# Patient Record
Sex: Female | Born: 1965 | Race: Black or African American | Hispanic: No | Marital: Married | Smoking: Former smoker
Health system: Southern US, Community
[De-identification: ages and names within clinical notes are randomized; demographics above are authoritative.]

## PROBLEM LIST (undated history)

## (undated) ENCOUNTER — Ambulatory Visit: Payer: 59

## (undated) DIAGNOSIS — I219 Acute myocardial infarction, unspecified: Secondary | ICD-10-CM

## (undated) DIAGNOSIS — I10 Essential (primary) hypertension: Secondary | ICD-10-CM

## (undated) DIAGNOSIS — E119 Type 2 diabetes mellitus without complications: Secondary | ICD-10-CM

## (undated) DIAGNOSIS — M1990 Unspecified osteoarthritis, unspecified site: Secondary | ICD-10-CM

## (undated) HISTORY — DX: Acute myocardial infarction, unspecified: I21.9

## (undated) HISTORY — PX: CORONARY ANGIOPLASTY WITH STENT PLACEMENT: SHX49

---

## 1998-10-28 ENCOUNTER — Encounter: Payer: Self-pay | Admitting: General Surgery

## 1998-10-28 ENCOUNTER — Ambulatory Visit (HOSPITAL_COMMUNITY): Admission: RE | Admit: 1998-10-28 | Discharge: 1998-10-28 | Payer: Self-pay

## 2000-09-26 ENCOUNTER — Other Ambulatory Visit: Admission: RE | Admit: 2000-09-26 | Discharge: 2000-09-26 | Payer: Self-pay | Admitting: Obstetrics and Gynecology

## 2001-12-10 ENCOUNTER — Other Ambulatory Visit: Admission: RE | Admit: 2001-12-10 | Discharge: 2001-12-10 | Payer: Self-pay | Admitting: Obstetrics and Gynecology

## 2001-12-19 ENCOUNTER — Encounter: Admission: RE | Admit: 2001-12-19 | Discharge: 2002-03-19 | Payer: Self-pay | Admitting: Family Medicine

## 2002-11-19 ENCOUNTER — Other Ambulatory Visit: Admission: RE | Admit: 2002-11-19 | Discharge: 2002-11-19 | Payer: Self-pay | Admitting: Obstetrics and Gynecology

## 2003-12-18 ENCOUNTER — Ambulatory Visit: Payer: Self-pay | Admitting: Family Medicine

## 2003-12-25 ENCOUNTER — Ambulatory Visit (HOSPITAL_COMMUNITY): Admission: RE | Admit: 2003-12-25 | Discharge: 2003-12-25 | Payer: Self-pay | Admitting: Obstetrics and Gynecology

## 2004-01-01 ENCOUNTER — Ambulatory Visit: Payer: Self-pay | Admitting: Family Medicine

## 2004-01-08 ENCOUNTER — Ambulatory Visit: Payer: Self-pay | Admitting: Family Medicine

## 2004-01-21 ENCOUNTER — Ambulatory Visit: Payer: Self-pay | Admitting: *Deleted

## 2004-01-29 ENCOUNTER — Ambulatory Visit (HOSPITAL_COMMUNITY): Admission: RE | Admit: 2004-01-29 | Discharge: 2004-01-29 | Payer: Self-pay | Admitting: *Deleted

## 2004-01-29 ENCOUNTER — Ambulatory Visit: Payer: Self-pay | Admitting: Family Medicine

## 2004-02-12 ENCOUNTER — Ambulatory Visit: Payer: Self-pay | Admitting: Family Medicine

## 2004-02-19 ENCOUNTER — Ambulatory Visit: Payer: Self-pay | Admitting: Family Medicine

## 2004-02-24 ENCOUNTER — Ambulatory Visit (HOSPITAL_COMMUNITY): Admission: RE | Admit: 2004-02-24 | Discharge: 2004-02-24 | Payer: Self-pay | Admitting: *Deleted

## 2004-02-24 ENCOUNTER — Ambulatory Visit: Payer: Self-pay | Admitting: *Deleted

## 2004-02-26 ENCOUNTER — Inpatient Hospital Stay (HOSPITAL_COMMUNITY): Admission: AD | Admit: 2004-02-26 | Discharge: 2004-02-28 | Payer: Self-pay | Admitting: Obstetrics and Gynecology

## 2004-02-26 ENCOUNTER — Ambulatory Visit: Payer: Self-pay | Admitting: *Deleted

## 2004-04-01 ENCOUNTER — Ambulatory Visit: Payer: Self-pay | Admitting: Family Medicine

## 2006-09-04 ENCOUNTER — Ambulatory Visit: Payer: Self-pay | Admitting: Family Medicine

## 2006-09-05 ENCOUNTER — Ambulatory Visit: Payer: Self-pay | Admitting: *Deleted

## 2006-09-11 ENCOUNTER — Ambulatory Visit: Payer: Self-pay | Admitting: Internal Medicine

## 2007-01-03 ENCOUNTER — Ambulatory Visit: Payer: Self-pay | Admitting: Internal Medicine

## 2007-01-05 ENCOUNTER — Ambulatory Visit: Payer: Self-pay | Admitting: Nurse Practitioner

## 2007-02-05 ENCOUNTER — Encounter (INDEPENDENT_AMBULATORY_CARE_PROVIDER_SITE_OTHER): Payer: Self-pay | Admitting: Nurse Practitioner

## 2007-02-05 ENCOUNTER — Encounter (INDEPENDENT_AMBULATORY_CARE_PROVIDER_SITE_OTHER): Payer: Self-pay | Admitting: Family Medicine

## 2007-02-05 ENCOUNTER — Ambulatory Visit: Payer: Self-pay | Admitting: Family Medicine

## 2007-11-01 ENCOUNTER — Encounter (INDEPENDENT_AMBULATORY_CARE_PROVIDER_SITE_OTHER): Payer: Self-pay | Admitting: Family Medicine

## 2007-11-01 ENCOUNTER — Ambulatory Visit: Payer: Self-pay | Admitting: Internal Medicine

## 2007-11-01 LAB — CONVERTED CEMR LAB
ALT: 14 units/L (ref 0–35)
AST: 13 units/L (ref 0–37)
Albumin: 4.7 g/dL (ref 3.5–5.2)
BUN: 10 mg/dL (ref 6–23)
CO2: 23 meq/L (ref 19–32)
Calcium: 9.7 mg/dL (ref 8.4–10.5)
Chloride: 99 meq/L (ref 96–112)
Cholesterol: 202 mg/dL — ABNORMAL HIGH (ref 0–200)
HDL: 45 mg/dL (ref >39)
Microalb, Ur: 3.68 mg/dL — ABNORMAL HIGH (ref 0.00–1.89)
Potassium: 4.3 meq/L (ref 3.5–5.3)

## 2008-08-21 ENCOUNTER — Ambulatory Visit: Payer: Self-pay | Admitting: Internal Medicine

## 2008-08-28 ENCOUNTER — Ambulatory Visit (HOSPITAL_COMMUNITY): Admission: RE | Admit: 2008-08-28 | Discharge: 2008-08-28 | Payer: Self-pay | Admitting: Family Medicine

## 2008-09-02 ENCOUNTER — Encounter: Admission: RE | Admit: 2008-09-02 | Discharge: 2008-09-02 | Payer: Self-pay | Admitting: Family Medicine

## 2008-11-24 ENCOUNTER — Ambulatory Visit: Payer: Self-pay | Admitting: Internal Medicine

## 2008-12-15 ENCOUNTER — Ambulatory Visit: Payer: Self-pay | Admitting: Internal Medicine

## 2008-12-15 ENCOUNTER — Encounter (INDEPENDENT_AMBULATORY_CARE_PROVIDER_SITE_OTHER): Payer: Self-pay | Admitting: Internal Medicine

## 2008-12-15 LAB — CONVERTED CEMR LAB
ALT: 21 units/L (ref 0–35)
Albumin: 4.4 g/dL (ref 3.5–5.2)
CO2: 21 meq/L (ref 19–32)
Calcium: 9.3 mg/dL (ref 8.4–10.5)
Chlamydia, DNA Probe: NEGATIVE
Chloride: 101 meq/L (ref 96–112)
Hemoglobin: 13.4 g/dL (ref 12.0–15.0)
Lymphs Abs: 2.5 10*3/uL (ref 0.7–4.0)
Monocytes Relative: 9 % (ref 3–12)
Neutro Abs: 3.3 10*3/uL (ref 1.7–7.7)
Neutrophils Relative %: 51 % (ref 43–77)
Potassium: 4.3 meq/L (ref 3.5–5.3)
RBC: 4.85 M/uL (ref 3.87–5.11)
Sodium: 136 meq/L (ref 135–145)
TSH: 0.502 microintl units/mL (ref 0.350–4.500)
Total Bilirubin: 0.6 mg/dL (ref 0.3–1.2)
Total Protein: 7.1 g/dL (ref 6.0–8.3)
WBC: 6.5 10*3/uL (ref 4.0–10.5)

## 2008-12-16 ENCOUNTER — Encounter (INDEPENDENT_AMBULATORY_CARE_PROVIDER_SITE_OTHER): Payer: Self-pay | Admitting: Internal Medicine

## 2008-12-23 ENCOUNTER — Ambulatory Visit (HOSPITAL_COMMUNITY): Admission: RE | Admit: 2008-12-23 | Discharge: 2008-12-23 | Payer: Self-pay | Admitting: Internal Medicine

## 2009-02-17 ENCOUNTER — Ambulatory Visit (HOSPITAL_COMMUNITY): Admission: RE | Admit: 2009-02-17 | Discharge: 2009-02-17 | Payer: Self-pay | Admitting: Internal Medicine

## 2009-03-24 ENCOUNTER — Ambulatory Visit: Payer: Self-pay | Admitting: Internal Medicine

## 2009-05-01 ENCOUNTER — Ambulatory Visit: Payer: Self-pay | Admitting: Family Medicine

## 2009-05-01 LAB — CONVERTED CEMR LAB
AST: 13 units/L (ref 0–37)
BUN: 8 mg/dL (ref 6–23)
Calcium: 9 mg/dL (ref 8.4–10.5)
Chloride: 98 meq/L (ref 96–112)
Creatinine, Ser: 0.55 mg/dL (ref 0.40–1.20)
HDL: 41 mg/dL (ref >39)
Hgb A1c MFr Bld: 10.3 % — ABNORMAL HIGH (ref 4.6–6.1)
Total CHOL/HDL Ratio: 3.8

## 2009-05-06 ENCOUNTER — Encounter (INDEPENDENT_AMBULATORY_CARE_PROVIDER_SITE_OTHER): Payer: Self-pay | Admitting: Family Medicine

## 2009-07-22 ENCOUNTER — Ambulatory Visit: Payer: Self-pay | Admitting: Family Medicine

## 2009-10-12 ENCOUNTER — Ambulatory Visit: Payer: Self-pay | Admitting: Internal Medicine

## 2009-10-12 ENCOUNTER — Encounter (INDEPENDENT_AMBULATORY_CARE_PROVIDER_SITE_OTHER): Payer: Self-pay | Admitting: Family Medicine

## 2009-10-19 ENCOUNTER — Ambulatory Visit (HOSPITAL_COMMUNITY): Admission: RE | Admit: 2009-10-19 | Discharge: 2009-10-19 | Payer: Self-pay | Admitting: Family Medicine

## 2009-11-04 ENCOUNTER — Ambulatory Visit: Payer: Self-pay | Admitting: Internal Medicine

## 2010-03-13 ENCOUNTER — Encounter: Payer: Self-pay | Admitting: *Deleted

## 2012-02-08 ENCOUNTER — Inpatient Hospital Stay (HOSPITAL_COMMUNITY): Payer: Medicaid Other

## 2012-02-08 ENCOUNTER — Emergency Department (HOSPITAL_COMMUNITY): Payer: Medicaid Other

## 2012-02-08 ENCOUNTER — Encounter (HOSPITAL_COMMUNITY): Payer: Self-pay | Admitting: Emergency Medicine

## 2012-02-08 ENCOUNTER — Inpatient Hospital Stay (HOSPITAL_COMMUNITY)
Admission: EM | Admit: 2012-02-08 | Discharge: 2012-02-14 | DRG: 853 | Disposition: A | Discharge status: Home / Self Care | Payer: Medicaid Other | Attending: Internal Medicine | Admitting: Internal Medicine

## 2012-02-08 DIAGNOSIS — E781 Pure hyperglyceridemia: Secondary | ICD-10-CM

## 2012-02-08 DIAGNOSIS — IMO0002 Reserved for concepts with insufficient information to code with codable children: Secondary | ICD-10-CM

## 2012-02-08 DIAGNOSIS — R6889 Other general symptoms and signs: Secondary | ICD-10-CM

## 2012-02-08 DIAGNOSIS — A419 Sepsis, unspecified organism: Secondary | ICD-10-CM

## 2012-02-08 DIAGNOSIS — E1110 Type 2 diabetes mellitus with ketoacidosis without coma: Secondary | ICD-10-CM

## 2012-02-08 DIAGNOSIS — R7881 Bacteremia: Secondary | ICD-10-CM

## 2012-02-08 DIAGNOSIS — R7989 Other specified abnormal findings of blood chemistry: Secondary | ICD-10-CM

## 2012-02-08 DIAGNOSIS — Z955 Presence of coronary angioplasty implant and graft: Secondary | ICD-10-CM

## 2012-02-08 DIAGNOSIS — I214 Non-ST elevation (NSTEMI) myocardial infarction: Secondary | ICD-10-CM

## 2012-02-08 DIAGNOSIS — E1165 Type 2 diabetes mellitus with hyperglycemia: Secondary | ICD-10-CM

## 2012-02-08 DIAGNOSIS — A4151 Sepsis due to Escherichia coli [E. coli]: Principal | ICD-10-CM | POA: Diagnosis present

## 2012-02-08 DIAGNOSIS — E875 Hyperkalemia: Secondary | ICD-10-CM | POA: Diagnosis present

## 2012-02-08 DIAGNOSIS — F17200 Nicotine dependence, unspecified, uncomplicated: Secondary | ICD-10-CM

## 2012-02-08 DIAGNOSIS — N390 Urinary tract infection, site not specified: Secondary | ICD-10-CM

## 2012-02-08 DIAGNOSIS — R748 Abnormal levels of other serum enzymes: Secondary | ICD-10-CM

## 2012-02-08 DIAGNOSIS — E871 Hypo-osmolality and hyponatremia: Secondary | ICD-10-CM | POA: Diagnosis present

## 2012-02-08 DIAGNOSIS — E1310 Other specified diabetes mellitus with ketoacidosis without coma: Secondary | ICD-10-CM | POA: Diagnosis present

## 2012-02-08 HISTORY — DX: Type 2 diabetes mellitus without complications: E11.9

## 2012-02-08 HISTORY — DX: Non-ST elevation (NSTEMI) myocardial infarction: I21.4

## 2012-02-08 LAB — URINALYSIS, ROUTINE W REFLEX MICROSCOPIC
Nitrite: NEGATIVE
Specific Gravity, Urine: 1.029 (ref 1.005–1.030)
Urobilinogen, UA: 0.2 mg/dL (ref 0.0–1.0)
pH: 5 (ref 5.0–8.0)

## 2012-02-08 LAB — BASIC METABOLIC PANEL
BUN: 13 mg/dL (ref 6–23)
CO2: 16 mEq/L — ABNORMAL LOW (ref 19–32)
Calcium: 9.7 mg/dL (ref 8.4–10.5)
Chloride: 101 mEq/L (ref 96–112)
GFR calc non Af Amer: >90 mL/min (ref >90)
Potassium: 4.2 mEq/L (ref 3.5–5.1)

## 2012-02-08 LAB — GLUCOSE, CAPILLARY
Glucose-Capillary: 378 mg/dL — ABNORMAL HIGH (ref 70–99)
Glucose-Capillary: 236 mg/dL — ABNORMAL HIGH (ref 70–99)

## 2012-02-08 LAB — CBC WITH DIFFERENTIAL/PLATELET
Eosinophils Absolute: 0 10*3/uL (ref 0.0–0.7)
Eosinophils Relative: 0 % (ref 0–5)
Hemoglobin: 15 g/dL (ref 12.0–15.0)
Lymphs Abs: 1.7 10*3/uL (ref 0.7–4.0)
MCH: 27.7 pg (ref 26.0–34.0)
MCV: 81.7 fL (ref 78.0–100.0)
Monocytes Absolute: 2.4 10*3/uL — ABNORMAL HIGH (ref 0.1–1.0)
Monocytes Relative: 17 % — ABNORMAL HIGH (ref 3–12)
Platelets: 251 10*3/uL (ref 150–400)
RBC: 5.41 MIL/uL — ABNORMAL HIGH (ref 3.87–5.11)

## 2012-02-08 LAB — CBC
HCT: 37.8 % (ref 36.0–46.0)
MCH: 27.4 pg (ref 26.0–34.0)
MCHC: 34.9 g/dL (ref 30.0–36.0)
MCV: 78.6 fL (ref 78.0–100.0)
Platelets: 228 10*3/uL (ref 150–400)
RDW: 13.4 % (ref 11.5–15.5)
WBC: 12.7 10*3/uL — ABNORMAL HIGH (ref 4.0–10.5)

## 2012-02-08 LAB — POCT I-STAT 3, ART BLOOD GAS (G3+)
Acid-base deficit: 15 mmol/L — ABNORMAL HIGH (ref 0.0–2.0)
O2 Saturation: 98 %
Patient temperature: 98.6
TCO2: 9 mmol/L (ref 0–100)
pCO2 arterial: 17.1 mmHg — CL (ref 35.0–45.0)

## 2012-02-08 LAB — URINE MICROSCOPIC-ADD ON

## 2012-02-08 LAB — LIPASE, BLOOD: Lipase: 25 U/L (ref 11–59)

## 2012-02-08 LAB — COMPREHENSIVE METABOLIC PANEL
BUN: 15 mg/dL (ref 6–23)
Calcium: 10.5 mg/dL (ref 8.4–10.5)
Creatinine, Ser: 0.74 mg/dL (ref 0.50–1.10)
GFR calc Af Amer: >90 mL/min (ref >90)
Glucose, Bld: 534 mg/dL — ABNORMAL HIGH (ref 70–99)
Total Protein: 8.8 g/dL — ABNORMAL HIGH (ref 6.0–8.3)

## 2012-02-08 LAB — CK TOTAL AND CKMB (NOT AT ARMC): Relative Index: 4.7 — ABNORMAL HIGH (ref 0.0–2.5)

## 2012-02-08 MED ORDER — METOPROLOL TARTRATE 12.5 MG HALF TABLET
12.5000 mg | ORAL_TABLET | Freq: Two times a day (BID) | ORAL | Status: DC
  Filled 2012-02-08: qty 1

## 2012-02-08 MED ORDER — IPRATROPIUM BROMIDE 0.02 % IN SOLN
0.5000 mg | Freq: Once | RESPIRATORY_TRACT | Status: DC
  Filled 2012-02-08: qty 2.5

## 2012-02-08 MED ORDER — ACETAMINOPHEN 325 MG PO TABS
650.0000 mg | ORAL_TABLET | Freq: Four times a day (QID) | ORAL | Status: DC | PRN
  Administered 2012-02-09 – 2012-02-10 (×3): 650 mg via ORAL
  Filled 2012-02-08 (×3): qty 2

## 2012-02-08 MED ORDER — ONDANSETRON HCL 4 MG/2ML IJ SOLN: 4.0000 mg | Freq: Once | INTRAMUSCULAR | Status: DC

## 2012-02-08 MED ORDER — SODIUM CHLORIDE 0.9 % IV SOLN: Freq: Once | INTRAVENOUS | Status: DC

## 2012-02-08 MED ORDER — SODIUM CHLORIDE 0.9 % IV BOLUS (SEPSIS): 1000.0000 mL | Freq: Once | INTRAVENOUS | Status: DC

## 2012-02-08 MED ORDER — ONDANSETRON HCL 4 MG PO TABS: 4.0000 mg | ORAL_TABLET | Freq: Four times a day (QID) | ORAL | Status: DC | PRN

## 2012-02-08 MED ORDER — ACETAMINOPHEN 650 MG RE SUPP: 650.0000 mg | Freq: Four times a day (QID) | RECTAL | Status: DC | PRN

## 2012-02-08 MED ORDER — ONDANSETRON HCL 4 MG/2ML IJ SOLN: 4.0000 mg | Freq: Four times a day (QID) | INTRAMUSCULAR | Status: DC | PRN

## 2012-02-08 MED ORDER — SODIUM CHLORIDE 0.9 % IV SOLN: INTRAVENOUS | Status: DC

## 2012-02-08 MED ORDER — IPRATROPIUM BROMIDE 0.02 % IN SOLN
0.5000 mg | Freq: Once | RESPIRATORY_TRACT | Status: AC
  Administered 2012-02-08: 0.5 mg via RESPIRATORY_TRACT

## 2012-02-08 MED ORDER — INSULIN ASPART 100 UNIT/ML ~~LOC~~ SOLN: 7.0000 [IU] | Freq: Once | SUBCUTANEOUS | Status: DC

## 2012-02-08 MED ORDER — ATORVASTATIN CALCIUM 80 MG PO TABS
80.0000 mg | ORAL_TABLET | Freq: Every day | ORAL | Status: DC
  Administered 2012-02-08 – 2012-02-13 (×6): 80 mg via ORAL
  Filled 2012-02-08 (×8): qty 1

## 2012-02-08 MED ORDER — INSULIN REGULAR BOLUS VIA INFUSION
0.0000 [IU] | Freq: Three times a day (TID) | INTRAVENOUS | Status: DC
  Filled 2012-02-08: qty 10

## 2012-02-08 MED ORDER — ASPIRIN 81 MG PO CHEW
324.0000 mg | CHEWABLE_TABLET | Freq: Once | ORAL | Status: DC
  Administered 2012-02-08: 324 mg via ORAL
  Filled 2012-02-08: qty 4

## 2012-02-08 MED ORDER — SODIUM CHLORIDE 0.9 % IV SOLN
INTRAVENOUS | Status: DC
  Administered 2012-02-08: 125 mL/h via INTRAVENOUS

## 2012-02-08 MED ORDER — METOPROLOL TARTRATE 25 MG PO TABS
25.0000 mg | ORAL_TABLET | Freq: Two times a day (BID) | ORAL | Status: DC
  Administered 2012-02-08 – 2012-02-14 (×12): 25 mg via ORAL
  Filled 2012-02-08 (×15): qty 1

## 2012-02-08 MED ORDER — DEXTROSE-NACL 5-0.45 % IV SOLN
INTRAVENOUS | Status: DC
  Administered 2012-02-08: 21:00:00 via INTRAVENOUS

## 2012-02-08 MED ORDER — METOPROLOL TARTRATE 25 MG PO TABS
25.0000 mg | ORAL_TABLET | Freq: Two times a day (BID) | ORAL | Status: DC
  Filled 2012-02-08: qty 1

## 2012-02-08 MED ORDER — DEXTROSE 50 % IV SOLN: 25.0000 mL | INTRAVENOUS | Status: DC | PRN

## 2012-02-08 MED ORDER — ALBUTEROL SULFATE (5 MG/ML) 0.5% IN NEBU
2.5000 mg | INHALATION_SOLUTION | Freq: Once | RESPIRATORY_TRACT | Status: DC
  Filled 2012-02-08: qty 0.5

## 2012-02-08 MED ORDER — ALBUTEROL SULFATE (5 MG/ML) 0.5% IN NEBU
2.5000 mg | INHALATION_SOLUTION | Freq: Once | RESPIRATORY_TRACT | Status: AC
  Administered 2012-02-08: 2.5 mg via RESPIRATORY_TRACT

## 2012-02-08 MED ORDER — SODIUM CHLORIDE 0.9 % IV SOLN
INTRAVENOUS | Status: DC
  Filled 2012-02-08 (×2): qty 1

## 2012-02-08 MED ORDER — SODIUM CHLORIDE 0.9 % IV SOLN
INTRAVENOUS | Status: DC
  Administered 2012-02-08: 3.2 [IU]/h via INTRAVENOUS
  Filled 2012-02-08: qty 1

## 2012-02-08 MED ORDER — SODIUM CHLORIDE 0.9 % IJ SOLN
3.0000 mL | Freq: Two times a day (BID) | INTRAMUSCULAR | Status: DC
Start: 2012-02-08 — End: 2012-02-14
  Administered 2012-02-08 – 2012-02-13 (×9): 3 mL via INTRAVENOUS

## 2012-02-08 MED ORDER — ASPIRIN 81 MG PO CHEW: 325.0000 mg | CHEWABLE_TABLET | Freq: Every day | ORAL | Status: DC

## 2012-02-08 MED ORDER — ENOXAPARIN SODIUM 80 MG/0.8ML ~~LOC~~ SOLN
80.0000 mg | Freq: Two times a day (BID) | SUBCUTANEOUS | Status: DC
  Administered 2012-02-08 – 2012-02-10 (×4): 80 mg via SUBCUTANEOUS
  Filled 2012-02-08 (×8): qty 0.8

## 2012-02-08 NOTE — ED Provider Notes (Addendum)
History     CSN: [id number]  Arrival date & time 02/08/12  1224   First MD Initiated Contact with Patient 02/08/12 1501      Chief Complaint  Patient presents with  . Influenza    (Consider location/radiation/quality/duration/timing/severity/associated sxs/prior treatment) Patient is a 46 y.o. female presenting with flu symptoms. The history is provided by the patient.  Influenza  She has been sick for the last 2 weeks. She's had a cough productive of clear to yellowish sputum and has had subjective fever and chills without sweats. There's been some clear rhinorrhea. She is noted that dyspnea with even modest exertion. Symptoms have been stable over time. There's been nausea without vomiting. She denies any diarrhea. She denies chest pain or abdominal pain. She has noted urinary frequency and excessive thirst. She is diabetic and takes metformin, but has not checked her blood sugars.  Past Medical History  Diagnosis Date  . Diabetes mellitus without complication     No past surgical history on file.  No family history on file.  History  Substance Use Topics  . Smoking status: Never Smoker   . Smokeless tobacco: Not on file  . Alcohol Use: Yes    OB History    Grav Para Term Preterm Abortions TAB SAB Ect Mult Living                  Review of Systems  All other systems reviewed and are negative.    Allergies  Review of patient's allergies indicates no known allergies.  Home Medications  No current outpatient prescriptions on file.  BP 146/112  Pulse 142  Temp 98 F (36.7 C)  SpO2 100%  Physical Exam  Nursing note and vitals reviewed. 46 year old female, resting comfortably and in no acute distress. Vital signs are significant for tachycardia with heart rate of 142, and hypertension with blood pressure 146/112. Oxygen saturation is 100%, which is normal. Head is normocephalic and atraumatic. PERRLA, EOMI. Oropharynx is clear. Neck is nontender and supple  without adenopathy or JVD. Back is nontender and there is no CVA tenderness. Lungs are clear without rales, wheezes, or rhonchi. Prolonged exhalation phase is noted. Chest is nontender. Heart has regular rate and rhythm without murmur. Rate is tachycardic. Abdomen is soft, flat, nontender without masses or hepatosplenomegaly and peristalsis is normoactive. Extremities have no cyanosis or edema, full range of motion is present. Skin is warm and dry without rash. Neurologic: Mental status is normal, cranial nerves are intact, there are no motor or sensory deficits.   ED Course  Procedures (including critical care time)  Results for orders placed during the hospital encounter of 02/08/12  CBC WITH DIFFERENTIAL      Component Value Range   WBC 14.6 (*) 4.0 - 10.5 K/uL   RBC 5.41 (*) 3.87 - 5.11 MIL/uL   Hemoglobin 15.0  12.0 - 15.0 g/dL   HCT 78.2  95.6 - 21.3 %   MCV 81.7  78.0 - 100.0 fL   MCH 27.7  26.0 - 34.0 pg   MCHC 33.9  30.0 - 36.0 g/dL   RDW 08.6  57.8 - 46.9 %   Platelets 251  150 - 400 K/uL   Neutrophils Relative 72  43 - 77 %   Neutro Abs 10.5 (*) 1.7 - 7.7 K/uL   Lymphocytes Relative 12  12 - 46 %   Lymphs Abs 1.7  0.7 - 4.0 K/uL   Monocytes Relative 17 (*) 3 - 12 %  Monocytes Absolute 2.4 (*) 0.1 - 1.0 K/uL   Eosinophils Relative 0  0 - 5 %   Eosinophils Absolute 0.0  0.0 - 0.7 K/uL   Basophils Relative 0  0 - 1 %   Basophils Absolute 0.1  0.0 - 0.1 K/uL  COMPREHENSIVE METABOLIC PANEL      Component Value Range   Sodium 127 (*) 135 - 145 mEq/L   Potassium 5.3 (*) 3.5 - 5.1 mEq/L   Chloride 88 (*) 96 - 112 mEq/L   CO2 12 (*) 19 - 32 mEq/L   Glucose, Bld 534 (*) 70 - 99 mg/dL   BUN 15  6 - 23 mg/dL   Creatinine, Ser 1.61  0.50 - 1.10 mg/dL   Calcium 09.6  8.4 - 04.5 mg/dL   Total Protein 8.8 (*) 6.0 - 8.3 g/dL   Albumin 3.7  3.5 - 5.2 g/dL   AST 45 (*) 0 - 37 U/L   ALT 23  0 - 35 U/L   Alkaline Phosphatase 309 (*) 39 - 117 U/L   Total Bilirubin 0.4  0.3 -  1.2 mg/dL   GFR calc non Af Amer >90  >90 mL/min   GFR calc Af Amer >90  >90 mL/min  URINALYSIS, ROUTINE W REFLEX MICROSCOPIC      Component Value Range   Color, Urine YELLOW  YELLOW   APPearance CLOUDY (*) CLEAR   Specific Gravity, Urine 1.029  1.005 - 1.030   pH 5.0  5.0 - 8.0   Glucose, UA >1000 (*) NEGATIVE mg/dL   Hgb urine dipstick LARGE (*) NEGATIVE   Bilirubin Urine MODERATE (*) NEGATIVE   Ketones, ur >80 (*) NEGATIVE mg/dL   Protein, ur 409 (*) NEGATIVE mg/dL   Urobilinogen, UA 0.2  0.0 - 1.0 mg/dL   Nitrite NEGATIVE  NEGATIVE   Leukocytes, UA TRACE (*) NEGATIVE  URINE MICROSCOPIC-ADD ON      Component Value Range   Squamous Epithelial / LPF RARE  RARE   WBC, UA 11-20  <3 WBC/hpf   RBC / HPF 21-50  <3 RBC/hpf   Bacteria, UA FEW (*) RARE   Urine-Other LESS THAN 10 mL OF URINE SUBMITTED    LIPASE, BLOOD      Component Value Range   Lipase 25  11 - 59 U/L  TROPONIN I      Component Value Range   Troponin I 7.56 (*) <0.30 ng/mL  POCT I-STAT 3, BLOOD GAS (G3+)      Component Value Range   pH, Arterial 7.299 (*) 7.350 - 7.450   pCO2 arterial 17.1 (*) 35.0 - 45.0 mmHg   pO2, Arterial 106.0 (*) 80.0 - 100.0 mmHg   Bicarbonate 8.4 (*) 20.0 - 24.0 mEq/L   TCO2 9  0 - 100 mmol/L   O2 Saturation 98.0     Acid-base deficit 15.0 (*) 0.0 - 2.0 mmol/L   Patient temperature 98.6 F     Collection site RADIAL, ALLEN'S TEST ACCEPTABLE     Drawn by Operator     Sample type ARTERIAL     Comment NOTIFIED PHYSICIAN     Dg Chest Portable 1 View  02/08/2012  *RADIOLOGY REPORT*  Clinical Data: Flu like symptoms.  Chest pain and shortness of breath.  PORTABLE CHEST - 1 VIEW  Comparison: None.  Findings: The heart size is normal.  The lung volumes are low. This exaggerates the pulmonary interstitium.  No focal airspace disease is evident.  The visualized soft tissues and bony  thorax are unremarkable.  IMPRESSION: No acute cardiopulmonary disease.   Original Report Authenticated By:  Marin Roberts, M.D.       Date: 02/08/2012  Rate: 119  Rhythm: sinus tachycardia  QRS Axis: normal  Intervals: normal  ST/T Wave abnormalities: early repolarization  Conduction Disutrbances:none  Narrative Interpretation: Sinus tachycardia, right atrial hypertrophy, early repolarization. No prior ECG available for comparison.  Old EKG Reviewed: none available    1. Diabetic ketoacidosis   2. Elevated alkaline phosphatase level    CRITICAL CARE Performed by: OZHYQ,[email or web address]   Total critical care time: 85 minutes  Critical care time was exclusive of separately billable procedures and treating other patients.  Critical care was necessary to treat or prevent imminent or life-threatening deterioration.  Critical care was time spent personally by me on the following activities: development of treatment plan with patient and/or surrogate as well as nursing, discussions with consultants, evaluation of patient's response to treatment, examination of patient, obtaining history from patient or surrogate, ordering and performing treatments and interventions, ordering and review of laboratory studies, ordering and review of radiographic studies, pulse oximetry and re-evaluation of patient's condition.   MDM  Symptoms suggestive of either a viral respiratory tract infection or pneumonia. Laboratory workup has come back showing evidence of ketoacidosis with blood sugar 534, low CO2 of 12 and elevated anion gap of 27. Chest x-ray will be obtained to rule out pneumonia. She will begin a therapeutic trial of albuterol with Atrovent. She is started on IV fluids and IV insulin drip. ECG will be obtained as well as troponin and lipase. She will need to be admitted for control of her ketoacidosis. Old records are reviewed, and she does not have any prior admissions for ketoacidosis.  Troponin has come back elevated. She will be started on heparin and cardiology consulted as well as internal medicine.  She's also given a dose of aspirin. I questioned the patient further about chest pain and she states she's been having exertional chest pain and exertional dyspnea for several months. The pattern is been stable, but over the last several days she has noted diaphoresis along with the chest pain and dyspnea. Chest pain only last about 30 seconds before resolving spontaneously. She's not had any prolonged episodes of pain.  Case has been discussed with Dr. Jomarie Longs of [facility], and Dr.Skains of [facility] cardiology. Both will be coming to see the patient.   Dione Booze, MD 02/08/12 1647  Dione Booze, MD 02/08/12 [phone]

## 2012-02-08 NOTE — H&P (Addendum)
[facility]          History and Physical    PCP:  Tomma Lightning, MD   Chief Complaint:  weakness  HPI: [name] is a 46 year old African American female with history of diabetes, tobacco use presents to the ER today with chief complaint of weakness for over 2 weeks, she reports intermittent chest pressure at rest and on exertion along with dyspnea on exertion for over 2 weeks. Denies any chest pain at this time, Upon evaluation in ER noted to have DKA with elevated troponin and EKG changes.  Allergies:  No Known Allergies    Past Medical History  Diagnosis Date  . Diabetes mellitus without complication     No past surgical history on file.  Prior to Admission medications   Medication Sig Start Date End Date Taking? Authorizing Provider  Biotin 5000 MCG CAPS Take 5,000 mcg by mouth daily.   Yes Historical Provider, MD  metFORMIN (GLUCOPHAGE) 500 MG tablet Take 500 mg by mouth 2 (two) times daily with a meal.   Yes Historical Provider, MD  OVER THE COUNTER MEDICATION Take 1 packet by mouth as needed. For cold  -  Theraflu packets   Yes Historical Provider, MD  PENICILLIN V POTASSIUM PO Take 1 tablet by mouth once.   Yes Historical Provider, MD    Social History:  reports that she has been smoking.  She does not have any smokeless tobacco history on file. She reports that she drinks alcohol. She reports that she does not use illicit drugs.  No family history on file.  Review of Systems: positives bolded Constitutional: Denies fever, chills, diaphoresis, appetite change and fatigue.  HEENT: Denies photophobia, eye pain, redness, hearing loss, ear pain, congestion, sore throat, rhinorrhea, sneezing, mouth sores, trouble swallowing, neck pain, neck stiffness and tinnitus.   Respiratory: Denies SOB, DOE, cough, chest tightness,  and wheezing.   Cardiovascular: Denies chest pain, palpitations and leg swelling.  Gastrointestinal: Denies nausea, vomiting,  abdominal pain, diarrhea, constipation, blood in stool and abdominal distention.  Genitourinary: Denies dysuria, urgency, frequency, hematuria, flank pain and difficulty urinating.  Musculoskeletal: Denies myalgias, back pain, joint swelling, arthralgias and gait problem.  Skin: Denies pallor, rash and wound.  Neurological: Denies dizziness, seizures, syncope, weakness, light-headedness, numbness and headaches.  Hematological: Denies adenopathy. Easy bruising, personal or family bleeding history  Psychiatric/Behavioral: Denies suicidal ideation, mood changes, confusion, nervousness, sleep disturbance and agitation   Physical Exam: Blood pressure 134/89, pulse 119, temperature 98 F (36.7 C), resp. rate 36, last menstrual period 02/07/2012, SpO2 100.00%. General: AAOx3,  in no acute distress  Head: Eyes PERRLA, . Normal cephalic and atramatic  Lungs: Clear bilaterally to auscultation and percussion. Increased respiratory rate. No wheezes, no rales, no accessory muscle use.  Heart: Tachycardic, regular, no S3 gallop appreciated Pulses are 2+ & equal.  No carotid bruit. No significant JVD. No abdominal bruits.  Abdomen: Bowel sounds are positive, abdomen soft and non-tender without masses. No hepatosplenomegaly.  Msk: Back normal. Normal strength and tone for age.  Extremities: No clubbing, cyanosis or edema. DP +1  Neuro:  non-focal Psych: Good affect, responds appropriately   Labs on Admission:  Results for orders placed during the hospital encounter of 02/08/12 (from the past 48 hour(s))  CBC WITH DIFFERENTIAL     Status: Abnormal   Collection Time   02/08/12 12:51 PM      Component Value Range Comment   WBC 14.6 (*) 4.0 - 10.5 K/uL  RBC 5.41 (*) 3.87 - 5.11 MIL/uL    Hemoglobin 15.0  12.0 - 15.0 g/dL    HCT 16.1  09.6 - 04.5 %    MCV 81.7  78.0 - 100.0 fL    MCH 27.7  26.0 - 34.0 pg    MCHC 33.9  30.0 - 36.0 g/dL    RDW 40.9  81.1 - 91.4 %    Platelets 251  150 - 400 K/uL     Neutrophils Relative 72  43 - 77 %    Neutro Abs 10.5 (*) 1.7 - 7.7 K/uL    Lymphocytes Relative 12  12 - 46 %    Lymphs Abs 1.7  0.7 - 4.0 K/uL    Monocytes Relative 17 (*) 3 - 12 %    Monocytes Absolute 2.4 (*) 0.1 - 1.0 K/uL    Eosinophils Relative 0  0 - 5 %    Eosinophils Absolute 0.0  0.0 - 0.7 K/uL    Basophils Relative 0  0 - 1 %    Basophils Absolute 0.1  0.0 - 0.1 K/uL   COMPREHENSIVE METABOLIC PANEL     Status: Abnormal   Collection Time   02/08/12 12:51 PM      Component Value Range Comment   Sodium 127 (*) 135 - 145 mEq/L    Potassium 5.3 (*) 3.5 - 5.1 mEq/L    Chloride 88 (*) 96 - 112 mEq/L    CO2 12 (*) 19 - 32 mEq/L    Glucose, Bld 534 (*) 70 - 99 mg/dL    BUN 15  6 - 23 mg/dL    Creatinine, Ser 7.82  0.50 - 1.10 mg/dL    Calcium 95.6  8.4 - 10.5 mg/dL    Total Protein 8.8 (*) 6.0 - 8.3 g/dL    Albumin 3.7  3.5 - 5.2 g/dL    AST 45 (*) 0 - 37 U/L    ALT 23  0 - 35 U/L    Alkaline Phosphatase 309 (*) 39 - 117 U/L    Total Bilirubin 0.4  0.3 - 1.2 mg/dL    GFR calc non Af Amer >90  >90 mL/min    GFR calc Af Amer >90  >90 mL/min   URINALYSIS, ROUTINE W REFLEX MICROSCOPIC     Status: Abnormal   Collection Time   02/08/12 12:51 PM      Component Value Range Comment   Color, Urine YELLOW  YELLOW    APPearance CLOUDY (*) CLEAR    Specific Gravity, Urine 1.029  1.005 - 1.030    pH 5.0  5.0 - 8.0    Glucose, UA >1000 (*) NEGATIVE mg/dL    Hgb urine dipstick LARGE (*) NEGATIVE    Bilirubin Urine MODERATE (*) NEGATIVE    Ketones, ur >80 (*) NEGATIVE mg/dL    Protein, ur 213 (*) NEGATIVE mg/dL    Urobilinogen, UA 0.2  0.0 - 1.0 mg/dL    Nitrite NEGATIVE  NEGATIVE    Leukocytes, UA TRACE (*) NEGATIVE   URINE MICROSCOPIC-ADD ON     Status: Abnormal   Collection Time   02/08/12 12:51 PM      Component Value Range Comment   Squamous Epithelial / LPF RARE  RARE    WBC, UA 11-20  <3 WBC/hpf    RBC / HPF 21-50  <3 RBC/hpf    Bacteria, UA FEW (*) RARE    Urine-Other  LESS THAN 10 mL OF URINE SUBMITTED  LIPASE, BLOOD     Status: Normal   Collection Time   02/08/12  3:16 PM      Component Value Range Comment   Lipase 25  11 - 59 U/L   TROPONIN I     Status: Abnormal   Collection Time   02/08/12  3:18 PM      Component Value Range Comment   Troponin I 7.56 (*) <0.30 ng/mL   POCT I-STAT 3, BLOOD GAS (G3+)     Status: Abnormal   Collection Time   02/08/12  4:15 PM      Component Value Range Comment   pH, Arterial 7.299 (*) 7.350 - 7.450    pCO2 arterial 17.1 (*) 35.0 - 45.0 mmHg    pO2, Arterial 106.0 (*) 80.0 - 100.0 mmHg    Bicarbonate 8.4 (*) 20.0 - 24.0 mEq/L    TCO2 9  0 - 100 mmol/L    O2 Saturation 98.0      Acid-base deficit 15.0 (*) 0.0 - 2.0 mmol/L    Patient temperature 98.6 F      Collection site RADIAL, ALLEN'S TEST ACCEPTABLE      Drawn by Operator      Sample type ARTERIAL      Comment NOTIFIED PHYSICIAN     GLUCOSE, CAPILLARY     Status: Abnormal   Collection Time   02/08/12  4:49 PM      Component Value Range Comment   Glucose-Capillary 378 (*) 70 - 99 mg/dL   TROPONIN I     Status: Abnormal   Collection Time   02/08/12  4:53 PM      Component Value Range Comment   Troponin I 8.46 (*) <0.30 ng/mL     Radiological Exams on Admission: Dg Chest Portable 1 View  02/08/2012  *RADIOLOGY REPORT*  Clinical Data: Flu like symptoms.  Chest pain and shortness of breath.  PORTABLE CHEST - 1 VIEW  Comparison: None.  Findings: The heart size is normal.  The lung volumes are low. This exaggerates the pulmonary interstitium.  No focal airspace disease is evident.  The visualized soft tissues and bony thorax are unremarkable.  IMPRESSION: No acute cardiopulmonary disease.   Original Report Authenticated By: Marin Roberts, M.D.     Assessment/Plan Active Problems:  1.  DKA (diabetic ketoacidoses) Likely triggered by cardiac event Reportedly CBGs well controlled till recently Admit to SDU, Glucomander protocol, aggressive IVF  NS at 125 ml/hr, monitor cardiopulm status closely given ongoing NSTEMI Bmet Q6 and transition to long acting insulin once anion gap closes Check HbA1c  2. NSTEMI (non-ST elevated myocardial infarction): chest pain free at this time. EKG with ST segment J point elevation, repolarization changes in leads 1, aVL and V2, Cycle cardiac markers Will start therapeutic lovenox for anticoagulation ASA 325mg , add metoprolol, lipids in am [facility] cardiology consulted discussed with Dr.Skains, treat metabolic abnormalities for now and plan cardiac cath in next 24-48hours  3. Mild Hyperkalemia: expect this to have improved with IV insulin, will FU on K with next bmet to be drawn now  DVT proph: already on therapeutic anticoagulation  FULL CODE Family communication: discussed with patient at bedside Disposition: inpatient  Time Spent on Admission: [facility] Pager: [phone] 02/08/2012, 6:00 PM

## 2012-02-08 NOTE — ED Notes (Signed)
EKG handed to Dr. Preston Fleeting.  Extra copy placed in pt chart.  No old EKG in Epic

## 2012-02-08 NOTE — ED Notes (Signed)
Chills sweats bad cough last week works w/ children

## 2012-02-08 NOTE — Consult Note (Addendum)
Admit date: 02/08/2012 Referring Physician  Dr. Dione Booze Primary Physician Tomma Lightning, MD Primary Cardiologist  none Reason for Consultation  elevated troponin  HPI: 46 year old female smoker with diabetes on metformin who has not seen her primary physician in quite some time who over the past several days/weeks has felt progressively worse prompting ER evaluation.  During the workup, she was noticeably tachypneic, tachycardic, and was found to be in diabetic ketoacidosis, metabolic acidosis with anion gap with a pH of 7.29, PCO2 of 17.1, PO2 of 106, bicarbonate of 12, sodium of 127, potassium of 5.3, white blood cell count of 14.6, hemoglobin of 15. Her EKG was also performed which demonstrated J-point elevation in leads V2 and 1 with concave ST segment changes in aVL with associated T-wave inversions. QTC was 450 ms with heart rate of 119 beats per minute. Her troponin was checked and was abnormal at 7.56.  When gathering her history, she states that over the past few months when walking up hills for instance with her friends, she has had to stop because of shortness of breath and sometimes a burning sensation in her mid chest. This subsides with rest. Over the past few weeks she has noted some difficulty laying flat with her breathing and sometimes has to turn on her side. She is felt overall ill, sometimes diaphoretic with a mild cough. Currently she is not having any chest discomfort.  She does not describe an early family history of coronary artery disease or heart failure. She's had no prior cardiovascular illness. She does smoke. She says she could not light a cigarette over the past 4 days because she felt so poorly. Her mother interestingly his 40 years old. She works at [employer]. She denies any alcohol use, no illicit drug use. She is married with a 55-year-old boy.    PMH:   Past Medical History  Diagnosis Date  . Diabetes mellitus without complication      PSH:  No past surgical history on file. Allergies:  Review of patient's allergies indicates no known allergies. Prior to Admit Meds:   (Not in a hospital admission) Prior to Admission medications   Medication Sig Start Date End Date Taking? Authorizing Provider  Biotin 5000 MCG CAPS Take 5,000 mcg by mouth daily.   Yes Historical Provider, MD  metFORMIN (GLUCOPHAGE) 500 MG tablet Take 500 mg by mouth 2 (two) times daily with a meal.   Yes Historical Provider, MD  OVER THE COUNTER MEDICATION Take 1 packet by mouth as needed. For cold  -  Theraflu packets   Yes Historical Provider, MD  PENICILLIN V POTASSIUM PO Take 1 tablet by mouth once.   Yes Historical Provider, MD     Fam HX:   No family history on file. as above, no early family history of coronary artery disease. Social HX:    History   Social History  . Marital Status: Married    Spouse Name: N/A    Number of Children: N/A  . Years of Education: N/A   Occupational History  . Not on file.   Social History Main Topics  . Smoking status: Current Every Day Smoker -- 1.0 packs/day for 20 years  . Smokeless tobacco: Not on file  . Alcohol Use: Yes     Comment: socially. 2-3 beers on weekend.  . Drug Use: No  . Sexually Active: Not on file   Other Topics Concern  . Not on file   Social History Narrative  .  No narrative on file     ROS:  Denies any syncope, palpitations, no dizziness, no rashes, no dysuria, no back pain. Currently no chest pain. 2All 11 ROS were addressed and are negative except what is stated in the HPI  Physical Exam: Blood pressure 135/79, pulse 116, temperature 98 F (36.7 C), resp. rate 22, last menstrual period 02/07/2012, SpO2 100.00%.    General: Well developed, well nourished, smiling in bed in no acute distress Head: Eyes PERRLA, No xanthomas.   Normal cephalic and atramatic  Lungs:   Clear bilaterally to auscultation and percussion. Increased respiratory rate. No wheezes, no rales. Heart:   Tachycardic, regular, no S3 gallop appreciated Pulses are 2+ & equal.            No carotid bruit. No significant JVD.  No abdominal bruits.  Abdomen: Bowel sounds are positive, abdomen soft and non-tender without masses. No hepatosplenomegaly. Msk:  Back normal. Normal strength and tone for age. Extremities:   No clubbing, cyanosis or edema.  DP +1 Neuro: Alert and oriented X 3, non-focal, MAE x 4 GU: Deferred Rectal: Deferred Psych:  Good affect, responds appropriately    Labs:   Lab Results  Component Value Date   WBC 14.6* 02/08/2012   HGB 15.0 02/08/2012   HCT 44.2 02/08/2012   MCV 81.7 02/08/2012   PLT 251 02/08/2012    Lab 02/08/12 1251  NA 127*  K 5.3*  CL 88*  CO2 12*  BUN 15  CREATININE 0.74  CALCIUM 10.5  PROT 8.8*  BILITOT 0.4  ALKPHOS 309*  ALT 23  AST 45*  GLUCOSE 534*   No results found for this basename: PTT   No results found for this basename: INR, PROTIME   Lab Results  Component Value Date   TROPONINI 7.56* 02/08/2012     Lab Results  Component Value Date   CHOL 157 05/01/2009   CHOL 202* 11/01/2007   Lab Results  Component Value Date   HDL 41 05/01/2009   HDL 45 1/61/0960   Lab Results  Component Value Date   LDLCALC 98 05/01/2009   LDLCALC 131* 11/01/2007   Lab Results  Component Value Date   TRIG 91 05/01/2009   TRIG 130 11/01/2007   Lab Results  Component Value Date   CHOLHDL 3.8 Ratio 05/01/2009   CHOLHDL 4.5 Ratio 11/01/2007   No results found for this basename: LDLDIRECT      Radiology:  Dg Chest Portable 1 View  02/08/2012  *RADIOLOGY REPORT*  Clinical Data: Flu like symptoms.  Chest pain and shortness of breath.  PORTABLE CHEST - 1 VIEW  Comparison: None.  Findings: The heart size is normal.  The lung volumes are low. This exaggerates the pulmonary interstitium.  No focal airspace disease is evident.  The visualized soft tissues and bony thorax are unremarkable.  IMPRESSION: No acute cardiopulmonary disease.   Original  Report Authenticated By: Marin Roberts, M.D.    Personally viewed.  EKG:  As described above in history of present illness. Personally viewed.   ASSESSMENT/PLAN:   46 year old female with diabetic ketoacidosis, hyponatremia, hyperkalemia, anion gap metabolic acidosis with respiratory compensation, elevated liver enzymes, leukocytosis with elevated troponin of 7.56 and a subacute history of dyspnea and chest pain along with abnormal EKG.  1. Non-ST elevation myocardial infarction/elevated troponin- it is possible that her elevated troponin is secondary to current metabolic arrangement with diabetic ketoacidosis, nonetheless I agree with current plan to place on IV heparin for anticoagulation. She  is currently chest pain-free. Her EKG does show ST segment elevation which has a more repolarization abnormality appearance in 1 and V2 however in aVL has a more worrisome appearance to it. She does not have any reciprocal ST segment depression. Her troponin is elevated at 7. Continue to trend troponin. I will order echocardiogram to assess for wall motion abnormality. I did not appreciate an S3 gallop on exam. Her chest x-ray does not demonstrate cardiomegaly. I did not observe significant JVD. She has no lower extremity edema. Plan for now should be correction of metabolic arrangement, DKA aggressive treatment. Aspirin. Beta blocker low dose. Statin.  Once all electrolytes and pH are stable, I will likely proceed with heart catheterization to assess her anatomy. If she begins to have any chest discomfort or show signs of cardiovascular instability, we will proceed with a more urgent cardiac catheterization.  2. Diabetic ketoacidosis-per primary team. Aggressive IV fluid hydration. Currently she demonstrates increased respiratory rate. Her PCO2 is subsequently quite low and she is trying to compensate for her metabolic acidosis. Her O2 saturation is normal. Continue to administer fluid. We will watch her  carefully for any signs of overt heart failure.   3. Tobacco cessation discussed.   I discussed plan with Dr. Arbie Cookey. Preston Fleeting.  Donato Schultz, MD  02/08/2012  5:21 PM

## 2012-02-08 NOTE — Progress Notes (Signed)
ANTICOAGULATION CONSULT NOTE - Initial Consult  Pharmacy Consult for Lovenox Indication: NSTEMI  No Known Allergies  Vital Signs: Temp: 98 F (36.7 C) (12/18 1230) BP: 135/79 mmHg (12/18 1617) Pulse Rate: 116  (12/18 1617)  Labs:  Basename 02/08/12 1518 02/08/12 1251  HGB -- 15.0  HCT -- 44.2  PLT -- 251  APTT -- --  LABPROT -- --  INR -- --  HEPARINUNFRC -- --  CREATININE -- 0.74  CKTOTAL -- --  CKMB -- --  TROPONINI 7.56* --    CrCl is unknown because there is no height on file for the current visit.   Medical History: Past Medical History  Diagnosis Date  . Diabetes mellitus without complication     Medications:   (Not in a hospital admission)  Assessment: 46 y/o female patient admitted with DKA, found to have positive cardiac enzymes requiring anticoagulation. EKG wnl.   Goal of Therapy:  Anti-Xa level 0.6-1.2 units/ml 4hrs after LMWH dose given Monitor platelets by anticoagulation protocol: Yes   Plan:  Lovenox 80mg  q12h, monitor renal function and cbc q72h.   Verlene Mayer, PharmD, BCPS Pager [phone] 02/08/2012,5:20 PM

## 2012-02-09 ENCOUNTER — Encounter (HOSPITAL_COMMUNITY): Payer: Self-pay | Admitting: *Deleted

## 2012-02-09 DIAGNOSIS — N390 Urinary tract infection, site not specified: Secondary | ICD-10-CM | POA: Diagnosis present

## 2012-02-09 DIAGNOSIS — R6889 Other general symptoms and signs: Secondary | ICD-10-CM | POA: Diagnosis present

## 2012-02-09 DIAGNOSIS — E781 Pure hyperglyceridemia: Secondary | ICD-10-CM | POA: Diagnosis present

## 2012-02-09 DIAGNOSIS — J111 Influenza due to unidentified influenza virus with other respiratory manifestations: Secondary | ICD-10-CM

## 2012-02-09 LAB — LIPID PANEL
HDL: 6 mg/dL — ABNORMAL LOW (ref >39)
LDL Cholesterol: 84 mg/dL (ref 0–99)
Total CHOL/HDL Ratio: 22 RATIO

## 2012-02-09 LAB — GLUCOSE, CAPILLARY
Glucose-Capillary: 138 mg/dL — ABNORMAL HIGH (ref 70–99)
Glucose-Capillary: 145 mg/dL — ABNORMAL HIGH (ref 70–99)
Glucose-Capillary: 199 mg/dL — ABNORMAL HIGH (ref 70–99)
Glucose-Capillary: 297 mg/dL — ABNORMAL HIGH (ref 70–99)
Glucose-Capillary: 338 mg/dL — ABNORMAL HIGH (ref 70–99)
Glucose-Capillary: 539 mg/dL — ABNORMAL HIGH (ref 70–99)

## 2012-02-09 LAB — URINE CULTURE

## 2012-02-09 LAB — BASIC METABOLIC PANEL
CO2: 18 mEq/L — ABNORMAL LOW (ref 19–32)
Chloride: 101 mEq/L (ref 96–112)
Creatinine, Ser: 0.49 mg/dL — ABNORMAL LOW (ref 0.50–1.10)
GFR calc Af Amer: >90 mL/min (ref >90)
GFR calc non Af Amer: >90 mL/min (ref >90)
Potassium: 3.7 mEq/L (ref 3.5–5.1)
Sodium: 131 mEq/L — ABNORMAL LOW (ref 135–145)

## 2012-02-09 LAB — CBC
HCT: 36.1 % (ref 36.0–46.0)
Hemoglobin: 12.7 g/dL (ref 12.0–15.0)
RBC: 4.58 MIL/uL (ref 3.87–5.11)

## 2012-02-09 LAB — COMPREHENSIVE METABOLIC PANEL
ALT: 17 U/L (ref 0–35)
Alkaline Phosphatase: 240 U/L — ABNORMAL HIGH (ref 39–117)
CO2: 19 mEq/L (ref 19–32)
Chloride: 101 mEq/L (ref 96–112)
GFR calc Af Amer: >90 mL/min (ref >90)
GFR calc non Af Amer: >90 mL/min (ref >90)
Glucose, Bld: 135 mg/dL — ABNORMAL HIGH (ref 70–99)
Potassium: 3.8 mEq/L (ref 3.5–5.1)
Sodium: 131 mEq/L — ABNORMAL LOW (ref 135–145)
Total Bilirubin: 0.4 mg/dL (ref 0.3–1.2)

## 2012-02-09 LAB — TROPONIN I: Troponin I: 7.09 ng/mL (ref <0.30)

## 2012-02-09 LAB — CK TOTAL AND CKMB (NOT AT ARMC)
CK, MB: 7.4 ng/mL (ref 0.3–4.0)
Relative Index: 3.9 — ABNORMAL HIGH (ref 0.0–2.5)
Total CK: 191 U/L — ABNORMAL HIGH (ref 7–177)
Total CK: 158 U/L (ref 7–177)

## 2012-02-09 LAB — INFLUENZA PANEL BY PCR (TYPE A & B)
H1N1 flu by pcr: NOT DETECTED
Influenza B By PCR: NEGATIVE

## 2012-02-09 MED ORDER — INSULIN ASPART 100 UNIT/ML ~~LOC~~ SOLN
0.0000 [IU] | SUBCUTANEOUS | Status: DC
  Administered 2012-02-09: 3 [IU] via SUBCUTANEOUS
  Administered 2012-02-09: 2 [IU] via SUBCUTANEOUS
  Administered 2012-02-09: 7 [IU] via SUBCUTANEOUS

## 2012-02-09 MED ORDER — INSULIN ASPART 100 UNIT/ML ~~LOC~~ SOLN: 0.0000 [IU] | Freq: Three times a day (TID) | SUBCUTANEOUS | Status: DC

## 2012-02-09 MED ORDER — INSULIN GLARGINE 100 UNIT/ML ~~LOC~~ SOLN
5.0000 [IU] | Freq: Every day | SUBCUTANEOUS | Status: DC
  Administered 2012-02-09 (×2): 5 [IU] via SUBCUTANEOUS

## 2012-02-09 MED ORDER — DEXTROSE 5 % IV SOLN: 1.0000 g | INTRAVENOUS | Status: DC

## 2012-02-09 MED ORDER — LIVING WELL WITH DIABETES BOOK
Freq: Once | Status: DC
  Filled 2012-02-09: qty 1

## 2012-02-09 MED ORDER — INSULIN ASPART 100 UNIT/ML ~~LOC~~ SOLN
0.0000 [IU] | Freq: Three times a day (TID) | SUBCUTANEOUS | Status: DC
  Administered 2012-02-09: 7 [IU] via SUBCUTANEOUS

## 2012-02-09 MED ORDER — DEXTROSE 5 % IV SOLN
1.0000 g | INTRAVENOUS | Status: DC
  Administered 2012-02-09 – 2012-02-10 (×2): 1 g via INTRAVENOUS
  Filled 2012-02-09 (×2): qty 10

## 2012-02-09 MED ORDER — BD GETTING STARTED TAKE HOME KIT: 3/10ML X 30G SYRINGES
1.0000 | Freq: Once | Status: AC
  Administered 2012-02-10: 1
  Filled 2012-02-09 (×2): qty 1

## 2012-02-09 MED ORDER — ASPIRIN 325 MG PO TABS
325.0000 mg | ORAL_TABLET | Freq: Every day | ORAL | Status: DC
  Administered 2012-02-09 – 2012-02-12 (×4): 325 mg via ORAL
  Filled 2012-02-09 (×6): qty 1

## 2012-02-09 NOTE — Progress Notes (Signed)
Utilization review completed.  

## 2012-02-09 NOTE — Progress Notes (Signed)
Called ed on call to notify that [facility] pt was only on Lovenox and a Heparin GTT, on call NP ordered heparin gtt and the DR d/c.d will continue to monitor

## 2012-02-09 NOTE — Progress Notes (Signed)
Inpatient Diabetes Program Recommendations  AACE/ADA: New Consensus Statement on Inpatient Glycemic Control (2013)  Target Ranges:  Prepandial:   less than 140 mg/dL      Peak postprandial:   less than 180 mg/dL (1-2 hours)      Critically ill patients:  140 - 180 mg/dL   Reason for Visit: Patient admitted with NSTEMI and DKA.  She states that she has had diabetes for 13 years and has only been on Metformin.  In the past her A1C has been between 7 and 8%.  Currently her A1C=13.0% indicating average CBG's around 330 mg/dL.  Discussed with patient her likely need for insulin at discharge.  She does not currently have insurance but will be getting insurance in January.  Discussed Lantus insulin and the use of basal insulin.  She states that she would be able to afford Lantus for a limited time until she gets insurance.   Will order Insulin starter kit, diabetes booklet, diabetes videos and outpatient diabetes education (for after patient gets insurance).   Patient seems motivated to learn more and states she has felt really bad for a while.  Discussed with RN.  Will follow-up with patient on 02/10/12.   Consider increasing Lantus to 15 units daily and add Novolog meal coverage 4 units tid with meals (hold if patient eats less than 50%).

## 2012-02-09 NOTE — Progress Notes (Signed)
Report called pt going to [id number] via w/c with belongings. Vickie Little

## 2012-02-09 NOTE — Plan of Care (Signed)
Problem: Phase I Progression Outcomes Goal: CBGs steadily decreasing on IV insulin drip Outcome: Completed/Met Date Met:  02/09/12 PT transitioned off of insulin GTT

## 2012-02-09 NOTE — Progress Notes (Signed)
Subjective: Pt states she was positive for flu. ?I don't see results in EPIC. Feels better. No SOB. Still with mild cough. Had body aches for past several days. Fever of 101.4 this am. WBC improving. IVF 125/hr. No chest pain. Smiling.   Objective:  Vital Signs in the last 24 hours: Temp:  [98 F (36.7 C)-101.1 F (38.4 C)] 98.1 F (36.7 C) (12/19 0800) Pulse Rate:  [103-142] 119  (12/19 0800) Resp:  [22-38] 29  (12/19 0800) BP: (105-146)/(66-112) 105/67 mmHg (12/19 0800) SpO2:  [98 %-100 %] 100 % (12/19 0800) Weight:  [75.8 kg (167 lb 1.7 oz)] 75.8 kg (167 lb 1.7 oz) (12/19 0511)  Intake/Output from previous day: 12/18 0701 - 12/19 0700 In: 1490 [P.O.:240; I.V.:1250] Out: 1450 [Urine:1450]   Physical Exam: General: Well developed, well nourished, in no acute distress. Head:  Normocephalic and atraumatic. Lungs: Clear to auscultation and percussion. Heart: Normal S1 and S2.  No murmur, rubs or gallops.  Abdomen: soft, non-tender, positive bowel sounds. Extremities: No clubbing or cyanosis. No edema. Neurologic: Alert and oriented x 3.    Lab Results:  Basename 02/09/12 0303 02/08/12 2100  WBC 9.8 12.7*  HGB 12.7 13.2  PLT 207 228    Basename 02/09/12 0303 02/08/12 2100  NA 131*131* 133*  K 3.83.7 4.2  CL 101101 101  CO2 1918* 16*  GLUCOSE 135*134* 225*  BUN 1211 13  CREATININE 0.510.49* 0.51    Basename 02/09/12 0303 02/08/12 2101  TROPONINI 7.09* 7.20*   Hepatic Function Panel  Basename 02/09/12 0303  PROT 6.9  ALBUMIN 2.9*  AST 27  ALT 17  ALKPHOS 240*  BILITOT 0.4  BILIDIR --  IBILI --    Basename 02/09/12 0303  CHOL 132   No results found for this basename: PROTIME in the last 72 hours  Imaging: Dg Chest Portable 1 View  02/08/2012  *RADIOLOGY REPORT*  Clinical Data: Flu like symptoms.  Chest pain and shortness of breath.  PORTABLE CHEST - 1 VIEW  Comparison: None.  Findings: The heart size is normal.  The lung volumes are low. This  exaggerates the pulmonary interstitium.  No focal airspace disease is evident.  The visualized soft tissues and bony thorax are unremarkable.  IMPRESSION: No acute cardiopulmonary disease.   Original Report Authenticated By: Marin Roberts, M.D.    Personally viewed.   Telemetry: Sinus tach, normal. Personally viewed.   EKG:  ST segment elevation in one and AVL has resolved.   Cardiac Studies:  ECHO pending  Assessment/Plan:  Active Problems:  DKA (diabetic ketoacidoses)  NSTEMI (non-ST elevated myocardial infarction)  1. NSTEMI - MB fraction relatively low for Troponin elevation (7-8). This may indicate prior event several hours ago. With her DKA, fever ?influenza, I still wonder if underlying illness precipitated this event. With fever this am and continuing to improve CO2 with IVF, I will postpone catheterization until further notice. She is not having any active CP. ECG improved. ECHO will be helpful in assessing EF. ?possible circumflex lesion.   Continue with low dose Bb, statin, ASA, heparin for today. Troponin and MB trending down. Will allow to eat.   2. DKA - improving. Hyperkalemia improved with IVF/ acidosis resolution. Continue with IVF. No signs of CHF.    SKAINS, MARK 02/09/2012, 8:45 AM

## 2012-02-09 NOTE — Progress Notes (Signed)
[facility] PROGRESS NOTE  NOVIS LEAGUE ZOX:[id number] DOB: [birth year] DOA: 02/08/2012 PCP: Tomma Lightning, MD  Brief narrative:   Chief Complaint:  weakness   HPI:  [name] is a 46 year old African American female with history of diabetes, tobacco use presents to the ER today with chief complaint of weakness for over 2 weeks, she reports intermittent chest pressure at rest and on exertion along with dyspnea on exertion for over 2 weeks.  Denies any chest pain at this time, Upon evaluation in ER noted to have DKA with elevated troponin and EKG changes.     Assessment/Plan: Active Problems:  DKA (diabetic ketoacidoses) Resolved- likely prompted by UTi and URI- only uses meformin at home- holding now due to planned cath.    NSTEMI (non-ST elevated myocardial infarction) Cardio following- cath when fevers resolve   Hypertriglyceridemia Have advised her to take low fat diet   UTI (lower urinary tract infection) F/u on culture- blood cultures ordered as well- temp 101 - started Rocephin-   Flu-like symptoms Influenza negative   Code Status: full code  Family Communication: husband in room Disposition Plan: transfer to tele DVT prophylaxis: Lovenox   Consultants:  cards  Procedures:  none  Antibiotics:  Rocephin started todah  HPI/Subjective: Pt alert- has had a runny nose for a few days. No dysuria, or suprapubic pain- has never had a UTI.   Objective: Filed Vitals:   02/09/12 0511 02/09/12 0800 02/09/12 1146 02/09/12 1254  BP:  105/67 117/64 122/71  Pulse:  119 115   Temp:  98.1 F (36.7 C) 98.9 F (37.2 C) 98.7 F (37.1 C)  TempSrc:  Oral Oral Oral  Resp:  29 33 28  Height: 5\' 7"  (1.702 m)     Weight: 75.8 kg (167 lb 1.7 oz)     SpO2:  100% 98% 100%    Intake/Output Summary (Last 24 hours) at 02/09/12 1412 Last data filed at 02/09/12 0900  Gross per 24 hour  Intake   1865 ml  Output   1800 ml  Net     65 ml     Exam:   General:  Alert, no distress  Cardiovascular: RRR, no murmurs  Respiratory: CTA b/l   Abdomen: Soft, NT, ND, BS+  Ext: no c/c/e  Data Reviewed: Basic Metabolic Panel:  Lab 02/09/12 8119 02/08/12 2100 02/08/12 1251  NA 131*131* 133* 127*  K 3.83.7 4.2 5.3*  CL 101101 101 88*  CO2 1918* 16* 12*  GLUCOSE 135*134* 225* 534*  BUN 1211 13 15  CREATININE 0.510.49* 0.51 0.74  CALCIUM 9.29.2 9.7 10.5  MG -- -- --  PHOS -- -- --   Liver Function Tests:  Lab 02/09/12 0303 02/08/12 1251  AST 27 45*  ALT 17 23  ALKPHOS 240* 309*  BILITOT 0.4 0.4  PROT 6.9 8.8*  ALBUMIN 2.9* 3.7    Lab 02/08/12 1516  LIPASE 25  AMYLASE --   No results found for this basename: AMMONIA:5 in the last 168 hours CBC:  Lab 02/09/12 0303 02/08/12 2100 02/08/12 1251  WBC 9.8 12.7* 14.6*  NEUTROABS -- -- 10.5*  HGB 12.7 13.2 15.0  HCT 36.1 37.8 44.2  MCV 78.8 78.6 81.7  PLT 207 228 251   Cardiac Enzymes:  Lab 02/09/12 0908 02/09/12 0303 02/08/12 2101 02/08/12 1653 02/08/12 1518  CKTOTAL 158 191* 260* -- --  CKMB 5.6* 7.4* 12.1* -- --  CKMBINDEX -- -- -- -- --  TROPONINI 4.54* 7.09* 7.20* 8.46* 7.56*  BNP (last 3 results) No results found for this basename: PROBNP:3 in the last 8760 hours CBG:  Lab 02/09/12 1144 02/09/12 0757 02/09/12 0411 02/09/12 0159 02/09/12 0053  GLUCAP 338* 201* 163* 138* 145*    Recent Results (from the past 240 hour(s))  MRSA PCR SCREENING     Status: Normal   Collection Time   02/08/12  7:25 PM      Component Value Range Status Comment   MRSA by PCR NEGATIVE  NEGATIVE Final      Studies: Dg Chest Portable 1 View  02/08/2012  *RADIOLOGY REPORT*  Clinical Data: Flu like symptoms.  Chest pain and shortness of breath.  PORTABLE CHEST - 1 VIEW  Comparison: None.  Findings: The heart size is normal.  The lung volumes are low. This exaggerates the pulmonary interstitium.  No focal airspace disease is evident.  The visualized soft  tissues and bony thorax are unremarkable.  IMPRESSION: No acute cardiopulmonary disease.   Original Report Authenticated By: Marin Roberts, M.D.     Scheduled Meds:   . aspirin  325 mg Oral Daily  . atorvastatin  80 mg Oral q1800  . cefTRIAXone (ROCEPHIN)  IV  1 g Intravenous Q24H  . enoxaparin (LOVENOX) injection  80 mg Subcutaneous Q12H  . insulin aspart  0-9 Units Subcutaneous Q4H  . insulin glargine  5 Units Subcutaneous QHS  . metoprolol tartrate  25 mg Oral BID  . sodium chloride  3 mL Intravenous Q12H   Continuous Infusions:   ________________________________________________________________________  Time spent: 30 min    [facility]  [facility] Pager [phone] If 8PM-8AM, please contact night-coverage at [email or web address], password [facility] 02/09/2012, 2:12 PM  LOS: 1 day

## 2012-02-10 DIAGNOSIS — R7881 Bacteremia: Secondary | ICD-10-CM

## 2012-02-10 DIAGNOSIS — E1165 Type 2 diabetes mellitus with hyperglycemia: Secondary | ICD-10-CM | POA: Diagnosis present

## 2012-02-10 LAB — GLUCOSE, CAPILLARY
Glucose-Capillary: 287 mg/dL — ABNORMAL HIGH (ref 70–99)
Glucose-Capillary: 352 mg/dL — ABNORMAL HIGH (ref 70–99)

## 2012-02-10 LAB — BASIC METABOLIC PANEL
CO2: 19 mEq/L (ref 19–32)
Chloride: 99 mEq/L (ref 96–112)
Glucose, Bld: 296 mg/dL — ABNORMAL HIGH (ref 70–99)
Potassium: 3.4 mEq/L — ABNORMAL LOW (ref 3.5–5.1)
Sodium: 134 mEq/L — ABNORMAL LOW (ref 135–145)

## 2012-02-10 MED ORDER — ENOXAPARIN SODIUM 40 MG/0.4ML ~~LOC~~ SOLN
40.0000 mg | SUBCUTANEOUS | Status: DC
  Administered 2012-02-11 – 2012-02-12 (×2): 40 mg via SUBCUTANEOUS
  Filled 2012-02-10 (×4): qty 0.4

## 2012-02-10 MED ORDER — PIPERACILLIN-TAZOBACTAM 3.375 G IVPB
3.3750 g | Freq: Three times a day (TID) | INTRAVENOUS | Status: DC
  Administered 2012-02-10 – 2012-02-12 (×6): 3.375 g via INTRAVENOUS
  Filled 2012-02-10 (×8): qty 50

## 2012-02-10 MED ORDER — INSULIN GLARGINE 100 UNIT/ML ~~LOC~~ SOLN
15.0000 [IU] | Freq: Every day | SUBCUTANEOUS | Status: DC
  Administered 2012-02-10: 15 [IU] via SUBCUTANEOUS

## 2012-02-10 MED ORDER — SENNOSIDES-DOCUSATE SODIUM 8.6-50 MG PO TABS
1.0000 | ORAL_TABLET | Freq: Two times a day (BID) | ORAL | Status: DC
  Administered 2012-02-10 – 2012-02-12 (×5): 1 via ORAL
  Filled 2012-02-10 (×11): qty 1

## 2012-02-10 MED ORDER — INSULIN ASPART 100 UNIT/ML ~~LOC~~ SOLN
0.0000 [IU] | Freq: Three times a day (TID) | SUBCUTANEOUS | Status: DC
Start: 2012-02-10 — End: 2012-02-14
  Administered 2012-02-10: 3 [IU] via SUBCUTANEOUS
  Administered 2012-02-10: 9 [IU] via SUBCUTANEOUS
  Administered 2012-02-11: 5 [IU] via SUBCUTANEOUS
  Administered 2012-02-11: 7 [IU] via SUBCUTANEOUS
  Administered 2012-02-11: 5 [IU] via SUBCUTANEOUS
  Administered 2012-02-12: 4 [IU] via SUBCUTANEOUS
  Administered 2012-02-12: 7 [IU] via SUBCUTANEOUS
  Administered 2012-02-12: 5 [IU] via SUBCUTANEOUS
  Administered 2012-02-13 (×2): 3 [IU] via SUBCUTANEOUS
  Administered 2012-02-14: 2 [IU] via SUBCUTANEOUS

## 2012-02-10 MED ORDER — INSULIN ASPART 100 UNIT/ML ~~LOC~~ SOLN
4.0000 [IU] | Freq: Three times a day (TID) | SUBCUTANEOUS | Status: DC
  Administered 2012-02-10 – 2012-02-14 (×11): 4 [IU] via SUBCUTANEOUS

## 2012-02-10 NOTE — Progress Notes (Signed)
Patient seen and examined. Agree with note by Toya Smothers, NP. Patient is here with a NSTEMI and DKA. Cardiology is planning on a cath on Monday and we are adjusting her insulin dose. She also has a temp (Tmax 102.6). CXR neg, UA is dirty but cx with multiple morphotypes. Will resend cx. Blood cx neg so far. Flu screen negative. Will continue to follow.  Peggye Pitt, MD [facility] Pager: [phone]

## 2012-02-10 NOTE — Progress Notes (Signed)
ANTIBIOTIC CONSULT NOTE - INITIAL  Pharmacy Consult for Zosyn Indication: Tmax 102.6 , ? UTID  No Known Allergies  Patient Measurements: Height: 5\' 7"  (170.2 cm) Weight: 167 lb 1.7 oz (75.8 kg) IBW/kg (Calculated) : 61.6    Vital Signs: Temp: 99.5 F (37.5 C) (12/20 1356) BP: 114/72 mmHg (12/20 1356) Pulse Rate: 99  (12/20 1356) Intake/Output from previous day: 12/19 0701 - 12/20 0700 In: 735 [P.O.:360; I.V.:375] Out: 1000 [Urine:1000] Intake/Output from this shift: Total I/O In: 363 [P.O.:360; I.V.:3] Out: -   Labs:  Basename 02/10/12 0750 02/09/12 0303 02/08/12 2100 02/08/12 1251  WBC -- 9.8 12.7* 14.6*  HGB -- 12.7 13.2 15.0  PLT -- 207 228 251  LABCREA -- -- -- --  CREATININE 0.49* 0.510.49* 0.51 --   Estimated Creatinine Clearance: 95.3 ml/min (by C-G formula based on Cr of 0.49). No results found for this basename: VANCOTROUGH:2,VANCOPEAK:2,VANCORANDOM:2,GENTTROUGH:2,GENTPEAK:2,GENTRANDOM:2,TOBRATROUGH:2,TOBRAPEAK:2,TOBRARND:2,AMIKACINPEAK:2,AMIKACINTROU:2,AMIKACIN:2, in the last 72 hours   Microbiology: Recent Results (from the past 720 hour(s))  URINE CULTURE     Status: Normal   Collection Time   02/08/12 12:51 PM      Component Value Range Status Comment   Specimen Description URINE, CLEAN CATCH   Final    Special Requests NONE   Final    Culture  Setup Time 02/08/2012 15:18   Final    Colony Count 20,OOO COLONIES/ML   Final    Culture     Final    Value: Multiple bacterial morphotypes present, none predominant. Suggest appropriate recollection if clinically indicated.   Report Status 02/09/2012 FINAL   Final   MRSA PCR SCREENING     Status: Normal   Collection Time   02/08/12  7:25 PM      Component Value Range Status Comment   MRSA by PCR NEGATIVE  NEGATIVE Final   CULTURE, BLOOD (ROUTINE X 2)     Status: Normal (Preliminary result)   Collection Time   02/09/12  9:00 AM      Component Value Range Status Comment   Specimen Description BLOOD ARM  RIGHT   Final    Special Requests BOTTLES DRAWN AEROBIC AND ANAEROBIC 10CC   Final    Culture  Setup Time 02/09/2012 14:00   Final    Culture     Final    Value: GRAM NEGATIVE RODS     Note: Gram Stain Report Called to,Read Back By and Verified With: JUDITH K@1236  ON 122013 BY [facility]   Report Status PENDING   Incomplete   CULTURE, BLOOD (ROUTINE X 2)     Status: Normal (Preliminary result)   Collection Time   02/09/12  9:10 AM      Component Value Range Status Comment   Specimen Description BLOOD HAND LEFT   Final    Special Requests BOTTLES DRAWN AEROBIC ONLY 10CC   Final    Culture  Setup Time 02/09/2012 14:00   Final    Culture     Final    Value:        BLOOD CULTURE RECEIVED NO GROWTH TO DATE CULTURE WILL BE HELD FOR 5 DAYS BEFORE ISSUING A FINAL NEGATIVE REPORT   Report Status PENDING   Incomplete     Medical History: Past Medical History  Diagnosis Date  . Diabetes mellitus without complication     Medications:  Prescriptions prior to admission  Medication Sig Dispense Refill  . Biotin 5000 MCG CAPS Take 5,000 mcg by mouth daily.      . metFORMIN (  GLUCOPHAGE) 500 MG tablet Take 500 mg by mouth 2 (two) times daily with a meal.      . OVER THE COUNTER MEDICATION Take 1 packet by mouth as needed. For cold  -  Theraflu packets      . PENICILLIN V POTASSIUM PO Take 1 tablet by mouth once.       Assessment: 46 y.o female admitted with with a NSTEMI and DKA. Cardiology is planning on a cath on Monday 02/13/12. Her Tmax is 102.6, CXR is neg, UA is dirty but cx with multiple morphotypes. Blood cx neg so far. Flu screen negative.  SCr = 0.49,  CrCl ~ 95 ml/min   Goal of Therapy:  Appropripate antibiotic dosing for renal function.   Plan:  Zosyn 3.375 g IV q8h (infuse each dose over 4 hours).  Follow up cultures.  Noah Delaine, RPh Clinical Pharmacist Pager: [phone] 02/10/2012,3:11 PM

## 2012-02-10 NOTE — Progress Notes (Signed)
Subjective:  Feels much better. No CP, no SOB, no rash. Had fever 102 last night.   Objective:  Vital Signs in the last 24 hours: Temp:  [98.7 F (37.1 C)-102.6 F (39.2 C)] 100.7 F (38.2 C) (12/20 0500) Pulse Rate:  [115-123] 117  (12/20 0500) Resp:  [20-33] 20  (12/20 0500) BP: (117-124)/(64-79) 122/76 mmHg (12/20 0500) SpO2:  [94 %-100 %] 94 % (12/20 0500)  Intake/Output from previous day: 12/19 0701 - 12/20 0700 In: 735 [P.O.:360; I.V.:375] Out: 1000 [Urine:1000]   Physical Exam: General: Well developed, well nourished, in no acute distress. Head:  Normocephalic and atraumatic. Lungs: Clear to auscultation and percussion. Heart: Tachy RR.  No murmur, rubs or gallops.  Abdomen: soft, non-tender, positive bowel sounds. Extremities: No clubbing or cyanosis. No edema. Neurologic: Alert and oriented x 3.    Lab Results:  Basename 02/09/12 0303 02/08/12 2100  WBC 9.8 12.7*  HGB 12.7 13.2  PLT 207 228    Basename 02/09/12 0303 02/08/12 2100  NA 131*131* 133*  K 3.83.7 4.2  CL 101101 101  CO2 1918* 16*  GLUCOSE 135*134* 225*  BUN 1211 13  CREATININE 0.510.49* 0.51    Basename 02/09/12 0908 02/09/12 0303  TROPONINI 4.54* 7.09*   Hepatic Function Panel  Basename 02/09/12 0303  PROT 6.9  ALBUMIN 2.9*  AST 27  ALT 17  ALKPHOS 240*  BILITOT 0.4  BILIDIR --  IBILI --    Basename 02/09/12 0303  CHOL 132   No results found for this basename: PROTIME in the last 72 hours  Imaging: Dg Chest Portable 1 View  02/08/2012  *RADIOLOGY REPORT*  Clinical Data: Flu like symptoms.  Chest pain and shortness of breath.  PORTABLE CHEST - 1 VIEW  Comparison: None.  Findings: The heart size is normal.  The lung volumes are low. This exaggerates the pulmonary interstitium.  No focal airspace disease is evident.  The visualized soft tissues and bony thorax are unremarkable.  IMPRESSION: No acute cardiopulmonary disease.   Original Report Authenticated By: Marin Roberts, M.D.    Personally viewed.   Telemetry: Sinus Tach Personally viewed.   EKG:  Resolution of ST deviation.   AWAIT ECHO   Assessment/Plan:  Active Problems:  NSTEMI (non-ST elevated myocardial infarction)  Hypertriglyceridemia  UTI (lower urinary tract infection)  Flu-like symptoms  Diabetes mellitus type II, uncontrolled   46 year old with DKA, Troponin as high as 8 with MB of 12, recent dyspnea, flu like symptoms. Feeling much better.   1. NSTEMI - Trop trended down. Greater than 24 hours full dose Lovenox, will change to 40mg  QD, DVT proph. Dose.  Will continue Bb. Statin. ASA. Possible type 2. ECG changes at first with 1, L ST elevation, resolved.   No cath today. Fever 102. UTI source? BCX neg. Influenza neg.  ?Myocarditis.   ECHO will be very helpful. Will call to make sure this gets done.   OK with her ambulating.   Will likely cath on Monday. Discussed fears.   2. DKA - improved.   3. Tachycardia - reactive to infection/fever/?Myocarditis. Does not clinically appear to have acute heart failure.    4. TOBACCO cessation.     Martrell Eguia 02/10/2012, 9:07 AM

## 2012-02-10 NOTE — Progress Notes (Signed)
[facility] PROGRESS NOTE  Vickie Little YQM:[id number] DOB: [birth year] DOA: 02/08/2012 PCP: Tomma Lightning, MD  Assessment/Plan: DKA (diabetic ketoacidoses)  Resolved- likely prompted by UTi and URI- only uses meformin at home- holding now due to planned cath.  DM type II uncontrolled A1c 13. Appreciate diabetic coordinator input. CBG range 201-328. Will increase lantus to 15u and add meal coverage as pt is eating >50% of meals. Continue SSI. NSTEMI (non-ST elevated myocardial infarction)  Cardio following- cath when fevers resolve . No CP. No events on tele. Continue asa statin, BB.  Hypertriglyceridemia  Have advised her to take low fat diet  UTI (lower urinary tract infection)   Urine culture with multiple bacterial morphotypes non predominant. - blood cultures no growth to date. Temp 100.7 Rocephin- day #2. Flu-like symptoms  Influenza negative. Much improved.  Constipation Reports no BM for 3 days. Will order softener.  Anxiety Mild due to upcoming cath. Verbalized fear/anxiety about procedure. Educated and offered emotional support. Good support system with friends family  Code Status: full Family Communication: pt at bedside Disposition Plan: home when ready   Consultants:  cardiology  Procedures:  none  Antibiotics:  Rocephin 02/09/12 >>>  HPI/Subjective: Sitting up in bed. Reports "feeling so much better" denies CP. Reports constipation and feelings of fear/anxiety around cath.   Objective: Filed Vitals:   02/09/12 1254 02/09/12 1400 02/09/12 2200 02/10/12 0500  BP: 122/71 124/79 119/76 122/76  Pulse:  118 123 117  Temp: 98.7 F (37.1 C) 100.1 F (37.8 C) 102.6 F (39.2 C) 100.7 F (38.2 C)  TempSrc: Oral Oral    Resp: 28 26 22 20   Height:      Weight:      SpO2: 100% 99% 98% 94%    Intake/Output Summary (Last 24 hours) at 02/10/12 0837 Last data filed at 02/09/12 1446  Gross per 24 hour  Intake    485 ml  Output    650 ml  Net    -165 ml   Filed Weights   02/09/12 0511  Weight: 75.8 kg (167 lb 1.7 oz)    Exam:   General:  Awake alert oriented x3 NAD  Cardiovascular: tachycardic, regular no MGR   Respiratory: Normal effort BSCTAB No wheeze/rhonchi  Abdomen: round soft +BS non-tender to palpation.   Data Reviewed: Basic Metabolic Panel:  Lab 02/09/12 5284 02/08/12 2100 02/08/12 1251  NA 131*131* 133* 127*  K 3.83.7 4.2 5.3*  CL 101101 101 88*  CO2 1918* 16* 12*  GLUCOSE 135*134* 225* 534*  BUN 1211 13 15  CREATININE 0.510.49* 0.51 0.74  CALCIUM 9.29.2 9.7 10.5  MG -- -- --  PHOS -- -- --   Liver Function Tests:  Lab 02/09/12 0303 02/08/12 1251  AST 27 45*  ALT 17 23  ALKPHOS 240* 309*  BILITOT 0.4 0.4  PROT 6.9 8.8*  ALBUMIN 2.9* 3.7    Lab 02/08/12 1516  LIPASE 25  AMYLASE --   No results found for this basename: AMMONIA:5 in the last 168 hours CBC:  Lab 02/09/12 0303 02/08/12 2100 02/08/12 1251  WBC 9.8 12.7* 14.6*  NEUTROABS -- -- 10.5*  HGB 12.7 13.2 15.0  HCT 36.1 37.8 44.2  MCV 78.8 78.6 81.7  PLT 207 228 251   Cardiac Enzymes:  Lab 02/09/12 0908 02/09/12 0303 02/08/12 2101 02/08/12 1653 02/08/12 1518  CKTOTAL 158 191* 260* -- --  CKMB 5.6* 7.4* 12.1* -- --  CKMBINDEX -- -- -- -- --  TROPONINI 4.54* 7.09*  7.20* 8.46* 7.56*   BNP (last 3 results) No results found for this basename: PROBNP:3 in the last 8760 hours CBG:  Lab 02/10/12 0723 02/09/12 2151 02/09/12 1647 02/09/12 1144 02/09/12 0757  GLUCAP 287* 260* 315* 338* 201*    Recent Results (from the past 240 hour(s))  URINE CULTURE     Status: Normal   Collection Time   02/08/12 12:51 PM      Component Value Range Status Comment   Specimen Description URINE, CLEAN CATCH   Final    Special Requests NONE   Final    Culture  Setup Time 02/08/2012 15:18   Final    Colony Count 20,OOO COLONIES/ML   Final    Culture     Final    Value: Multiple bacterial morphotypes present, none predominant. Suggest  appropriate recollection if clinically indicated.   Report Status 02/09/2012 FINAL   Final   MRSA PCR SCREENING     Status: Normal   Collection Time   02/08/12  7:25 PM      Component Value Range Status Comment   MRSA by PCR NEGATIVE  NEGATIVE Final   CULTURE, BLOOD (ROUTINE X 2)     Status: Normal (Preliminary result)   Collection Time   02/09/12  9:00 AM      Component Value Range Status Comment   Specimen Description BLOOD ARM RIGHT   Final    Special Requests BOTTLES DRAWN AEROBIC AND ANAEROBIC 10CC   Final    Culture  Setup Time 02/09/2012 14:00   Final    Culture     Final    Value:        BLOOD CULTURE RECEIVED NO GROWTH TO DATE CULTURE WILL BE HELD FOR 5 DAYS BEFORE ISSUING A FINAL NEGATIVE REPORT   Report Status PENDING   Incomplete   CULTURE, BLOOD (ROUTINE X 2)     Status: Normal (Preliminary result)   Collection Time   02/09/12  9:10 AM      Component Value Range Status Comment   Specimen Description BLOOD HAND LEFT   Final    Special Requests BOTTLES DRAWN AEROBIC ONLY 10CC   Final    Culture  Setup Time 02/09/2012 14:00   Final    Culture     Final    Value:        BLOOD CULTURE RECEIVED NO GROWTH TO DATE CULTURE WILL BE HELD FOR 5 DAYS BEFORE ISSUING A FINAL NEGATIVE REPORT   Report Status PENDING   Incomplete      Studies: Dg Chest Portable 1 View  02/08/2012  *RADIOLOGY REPORT*  Clinical Data: Flu like symptoms.  Chest pain and shortness of breath.  PORTABLE CHEST - 1 VIEW  Comparison: None.  Findings: The heart size is normal.  The lung volumes are low. This exaggerates the pulmonary interstitium.  No focal airspace disease is evident.  The visualized soft tissues and bony thorax are unremarkable.  IMPRESSION: No acute cardiopulmonary disease.   Original Report Authenticated By: Marin Roberts, M.D.     Scheduled Meds:   . aspirin  325 mg Oral Daily  . atorvastatin  80 mg Oral q1800  . bd getting started take home kit  1 kit Other Once  . cefTRIAXone  (ROCEPHIN)  IV  1 g Intravenous Q24H  . enoxaparin (LOVENOX) injection  80 mg Subcutaneous Q12H  . insulin aspart  0-9 Units Subcutaneous TID WC  . insulin aspart  4 Units Subcutaneous TID WC  .  insulin glargine  15 Units Subcutaneous QHS  . living well with diabetes book   Does not apply Once  . metoprolol tartrate  25 mg Oral BID  . sodium chloride  3 mL Intravenous Q12H   Continuous Infusions:   Active Problems:  DKA (diabetic ketoacidoses)  NSTEMI (non-ST elevated myocardial infarction)  Hypertriglyceridemia  UTI (lower urinary tract infection)  Flu-like symptoms    Time spent: 30 minutes    [facility] M  [facility]  If 8PM-8AM, please contact night-coverage at [email or web address], password [facility] 02/10/2012, 8:37 AM  LOS: 2 days

## 2012-02-10 NOTE — Progress Notes (Signed)
Inpatient Diabetes Program Recommendations  AACE/ADA: New Consensus Statement on Inpatient Glycemic Control (2013)  Target Ranges:  Prepandial:   less than 140 mg/dL      Peak postprandial:   less than 180 mg/dL (1-2 hours)      Critically ill patients:  140 - 180 mg/dL   Reason for Visit: Follow-up visit made.  Patient was getting ready to go to vascular lab.  Briefly spoke to patient regarding outpatient cost of Lantus.  The out of pocket cost of Lantus ranges between 206-233$.  She states she has began watching the diabetes videos.  RN also states that she will let patient give lunch time dose of insulin.

## 2012-02-10 NOTE — Progress Notes (Signed)
  Echocardiogram 2D Echocardiogram has been performed.  Elmar Antigua 02/10/2012, 11:40 AM

## 2012-02-11 DIAGNOSIS — B9689 Other specified bacterial agents as the cause of diseases classified elsewhere: Secondary | ICD-10-CM

## 2012-02-11 DIAGNOSIS — R7881 Bacteremia: Secondary | ICD-10-CM

## 2012-02-11 LAB — CBC
HCT: 32.6 % — ABNORMAL LOW (ref 36.0–46.0)
Hemoglobin: 10.9 g/dL — ABNORMAL LOW (ref 12.0–15.0)
MCH: 26.3 pg (ref 26.0–34.0)
MCHC: 33.4 g/dL (ref 30.0–36.0)
RBC: 4.14 MIL/uL (ref 3.87–5.11)

## 2012-02-11 LAB — GLUCOSE, CAPILLARY: Glucose-Capillary: 315 mg/dL — ABNORMAL HIGH (ref 70–99)

## 2012-02-11 MED ORDER — INSULIN GLARGINE 100 UNIT/ML ~~LOC~~ SOLN
20.0000 [IU] | Freq: Every day | SUBCUTANEOUS | Status: DC
  Administered 2012-02-11: 20 [IU] via SUBCUTANEOUS

## 2012-02-11 MED ORDER — BISACODYL 5 MG PO TBEC
10.0000 mg | DELAYED_RELEASE_TABLET | Freq: Once | ORAL | Status: AC
  Administered 2012-02-11: 10 mg via ORAL
  Filled 2012-02-11: qty 2

## 2012-02-11 NOTE — Progress Notes (Signed)
SUBJECTIVE:  No chest pain or SOB.  Blood cultures positive for gram negative rods  OBJECTIVE:   Vitals:   Filed Vitals:   02/10/12 1051 02/10/12 1356 02/10/12 2059 02/11/12 0500  BP: 119/78 114/72 100/64 108/75  Pulse: 105 99 96 97  Temp:  99.5 F (37.5 C) 99.4 F (37.4 C) 99.3 F (37.4 C)  TempSrc:      Resp:  20 20 20   Height:      Weight:      SpO2:  96% 98% 99%   I&O's:   Intake/Output Summary (Last 24 hours) at 02/11/12 0913 Last data filed at 02/11/12 0859  Gross per 24 hour  Intake    823 ml  Output    450 ml  Net    373 ml   TELEMETRY: Reviewed telemetry pt in NSR:     PHYSICAL EXAM General: Well developed, well nourished, in no acute distress Head: Eyes PERRLA, No xanthomas.   Normal cephalic and atramatic  Lungs:   Clear bilaterally to auscultation and percussion. Heart:   HRRR S1 S2 Pulses are 2+ & equal. Abdomen: Bowel sounds are positive, abdomen soft and non-tender without masses  Extremities:   No clubbing, cyanosis or edema.  DP +1 Neuro: Alert and oriented X 3. Psych:  Good affect, responds appropriately   LABS: Basic Metabolic Panel:  Basename 02/10/12 0750 02/09/12 0303  NA 134* 131*131*  K 3.4* 3.83.7  CL 99 101101  CO2 19 1918*  GLUCOSE 296* 135*134*  BUN 8 1211  CREATININE 0.49* 0.510.49*  CALCIUM 8.8 9.29.2  MG -- --  PHOS -- --   Liver Function Tests:  [facility] 02/09/12 0303 02/08/12 1251  AST 27 45*  ALT 17 23  ALKPHOS 240* 309*  BILITOT 0.4 0.4  PROT 6.9 8.8*  ALBUMIN 2.9* 3.7    Basename 02/08/12 1516  LIPASE 25  AMYLASE --   CBC:  Basename 02/11/12 0545 02/09/12 0303 02/08/12 1251  WBC 7.4 9.8 --  NEUTROABS -- -- 10.5*  HGB 10.9* 12.7 --  HCT 32.6* 36.1 --  MCV 78.7 78.8 --  PLT 236 207 --   Cardiac Enzymes:  Basename 02/09/12 0908 02/09/12 0303 02/08/12 2101  CKTOTAL 158 191* 260*  CKMB 5.6* 7.4* 12.1*  CKMBINDEX -- -- --  TROPONINI 4.54* 7.09* 7.20*   Hemoglobin A1C:  Basename 02/08/12  2100  HGBA1C 13.0*   Fasting Lipid Panel:  Basename 02/09/12 0303  CHOL 132  HDL 6*  LDLCALC 84  TRIG 209*  CHOLHDL 22.0  LDLDIRECT --     RADIOLOGY: Dg Chest Portable 1 View  02/08/2012  *RADIOLOGY REPORT*  Clinical Data: Flu like symptoms.  Chest pain and shortness of breath.  PORTABLE CHEST - 1 VIEW  Comparison: None.  Findings: The heart size is normal.  The lung volumes are low. This exaggerates the pulmonary interstitium.  No focal airspace disease is evident.  The visualized soft tissues and bony thorax are unremarkable.  IMPRESSION: No acute cardiopulmonary disease.   Original Report Authenticated By: Marin Roberts, M.D.    Assessment/Plan:  Active Problems:  NSTEMI (non-ST elevated myocardial infarction)  Hypertriglyceridemia  UTI (lower urinary tract infection)  Flu-like symptoms  Diabetes mellitus type II, uncontrolled   46 year old with DKA, Troponin as high as 8 with MB of 12, recent dyspnea, flu like symptoms. Feeling much better.  1. NSTEMI - Trop trended down. Greater than 24 hours full dose Lovenox, will change to 40mg  QD, DVT proph. Dose.  Will continue Bb. Statin. ASA. Possible type 2. ECG changes at first with 1, L ST elevation, resolved.  No cath today. Fever persists and now has GNR in her blood by blood cultures. UTI source? ?Myocarditis.  ECHO will be very helpful. Will call to make sure this gets done.  OK with her ambulating. Will hold on cath at this time until antibiotic course completed 2. DKA - improved.  3. Tachycardia - reactive to infection/fever/?Myocarditis. Does not clinically appear to have acute heart failure.  4. TOBACCO cessation.        Quintella Reichert, MD  02/11/2012  9:13 AM

## 2012-02-11 NOTE — Progress Notes (Signed)
[facility]             Progress Note   Subjective: No complaints.  Objective: Vital signs in last 24 hours: Temp:  [99.3 F (37.4 C)-99.5 F (37.5 C)] 99.3 F (37.4 C) (12/21 0500) Pulse Rate:  [92-99] 92  (12/21 1015) Resp:  [20] 20  (12/21 0500) BP: (100-114)/(64-75) 110/74 mmHg (12/21 1015) SpO2:  [96 %-99 %] 99 % (12/21 0500) Weight change:  Last BM Date: 02/07/12  Intake/Output from previous day: 12/20 0701 - 12/21 0700 In: 943 [P.O.:600; I.V.:3; IV Piggyback:100] Out: 450 [Urine:450] Total I/O In: 243 [P.O.:240; I.V.:3] Out: -    Physical Exam: General: Alert, awake, oriented x3, in no acute distress. HEENT: No bruits, no goiter. Heart: Regular rate and rhythm, without murmurs, rubs, gallops. Lungs: Clear to auscultation bilaterally. Abdomen: Soft, nontender, nondistended, positive bowel sounds. Extremities: No clubbing cyanosis or edema with positive pedal pulses. Neuro: Grossly intact, nonfocal.    Lab Results: Basic Metabolic Panel:  Basename 02/10/12 0750 02/09/12 0303  NA 134* 131*131*  K 3.4* 3.83.7  CL 99 101101  CO2 19 1918*  GLUCOSE 296* 135*134*  BUN 8 1211  CREATININE 0.49* 0.510.49*  CALCIUM 8.8 9.29.2  MG -- --  PHOS -- --   Liver Function Tests:  [facility] 02/09/12 0303 02/08/12 1251  AST 27 45*  ALT 17 23  ALKPHOS 240* 309*  BILITOT 0.4 0.4  PROT 6.9 8.8*  ALBUMIN 2.9* 3.7    Basename 02/08/12 1516  LIPASE 25  AMYLASE --   CBC:  Basename 02/11/12 0545 02/09/12 0303 02/08/12 1251  WBC 7.4 9.8 --  NEUTROABS -- -- 10.5*  HGB 10.9* 12.7 --  HCT 32.6* 36.1 --  MCV 78.7 78.8 --  PLT 236 207 --   Cardiac Enzymes:  Basename 02/09/12 0908 02/09/12 0303 02/08/12 2101  CKTOTAL 158 191* 260*  CKMB 5.6* 7.4* 12.1*  CKMBINDEX -- -- --  TROPONINI 4.54* 7.09* 7.20*   CBG:  Basename 02/11/12 0741 02/10/12 2135 02/10/12 1638 02/10/12 1224 02/10/12 0723 02/09/12 2151  GLUCAP 290* 296* 235* 352* 287*  260*   Hemoglobin A1C:  Basename 02/08/12 2100  HGBA1C 13.0*   Fasting Lipid Panel:  Basename 02/09/12 0303  CHOL 132  HDL 6*  LDLCALC 84  TRIG 209*  CHOLHDL 22.0  LDLDIRECT --     Urinalysis:  Basename 02/08/12 1251  COLORURINE YELLOW  LABSPEC 1.029  PHURINE 5.0  GLUCOSEU >1000*  HGBUR LARGE*  BILIRUBINUR MODERATE*  KETONESUR >80*  PROTEINUR 100*  UROBILINOGEN 0.2  NITRITE NEGATIVE  LEUKOCYTESUR TRACE*    Recent Results (from the past 240 hour(s))  URINE CULTURE     Status: Normal   Collection Time   02/08/12 12:51 PM      Component Value Range Status Comment   Specimen Description URINE, CLEAN CATCH   Final    Special Requests NONE   Final    Culture  Setup Time 02/08/2012 15:18   Final    Colony Count 20,OOO COLONIES/ML   Final    Culture     Final    Value: Multiple bacterial morphotypes present, none predominant. Suggest appropriate recollection if clinically indicated.   Report Status 02/09/2012 FINAL   Final   MRSA PCR SCREENING     Status: Normal   Collection Time   02/08/12  7:25 PM      Component Value Range Status Comment   MRSA by PCR NEGATIVE  NEGATIVE Final   CULTURE, BLOOD (ROUTINE  X 2)     Status: Normal (Preliminary result)   Collection Time   02/09/12  9:00 AM      Component Value Range Status Comment   Specimen Description BLOOD ARM RIGHT   Final    Special Requests BOTTLES DRAWN AEROBIC AND ANAEROBIC 10CC   Final    Culture  Setup Time 02/09/2012 14:00   Final    Culture     Final    Value: GRAM NEGATIVE RODS     Note: Gram Stain Report Called to,Read Back By and Verified With: JUDITH K@1236  ON 122013 BY [facility]   Report Status PENDING   Incomplete   CULTURE, BLOOD (ROUTINE X 2)     Status: Normal (Preliminary result)   Collection Time   02/09/12  9:10 AM      Component Value Range Status Comment   Specimen Description BLOOD HAND LEFT   Final    Special Requests BOTTLES DRAWN AEROBIC ONLY 10CC   Final    Culture  Setup Time  02/09/2012 14:00   Final    Culture     Final    Value:        BLOOD CULTURE RECEIVED NO GROWTH TO DATE CULTURE WILL BE HELD FOR 5 DAYS BEFORE ISSUING A FINAL NEGATIVE REPORT   Report Status PENDING   Incomplete     Studies/Results: No results found.  Medications: Scheduled Meds:   . aspirin  325 mg Oral Daily  . atorvastatin  80 mg Oral q1800  . enoxaparin (LOVENOX) injection  40 mg Subcutaneous Q24H  . insulin aspart  0-9 Units Subcutaneous TID WC  . insulin aspart  4 Units Subcutaneous TID WC  . insulin glargine  15 Units Subcutaneous QHS  . living well with diabetes book   Does not apply Once  . metoprolol tartrate  25 mg Oral BID  . piperacillin-tazobactam (ZOSYN)  IV  3.375 g Intravenous Q8H  . senna-docusate  1 tablet Oral BID  . sodium chloride  3 mL Intravenous Q12H   Continuous Infusions:  PRN Meds:.acetaminophen, acetaminophen, dextrose, ondansetron (ZOFRAN) IV, ondansetron  Assessment/Plan:  Active Problems:  NSTEMI (non-ST elevated myocardial infarction)  Hypertriglyceridemia  UTI (lower urinary tract infection)  Flu-like symptoms  Diabetes mellitus type II, uncontrolled  Bacteremia due to Gram-negative bacteria   NSTEMI -For cardiac cath. -Timing as per cards. -ECHO with:  Study Conclusions  Left ventricle: The cavity size was normal. Systolic function was normal. The estimated ejection fraction was in the range of 55% to 60%. Although no diagnostic regional wall motion abnormality was identified, this possibility cannot be completely excluded on the basis of this study.  DKA -Resolved. -CBGs improved. -Will increase lantus to 20.  GNR Bacteremia -Afebrile overnight. -Have placed on zosyn pending cx data. -Suspect urinary source altho initial cx with mult morphotypes. -Have resent urine cx. -Would treat with IV abx for at least 5 days.     Time spent coordinating care: 35 minutes   LOS: 3 days   HERNANDEZ ACOSTA,ESTELA [facility] Pager: [phone] 02/11/2012, 10:57 AM

## 2012-02-12 LAB — CULTURE, BLOOD (ROUTINE X 2)

## 2012-02-12 LAB — BASIC METABOLIC PANEL
CO2: 23 mEq/L (ref 19–32)
Calcium: 9.2 mg/dL (ref 8.4–10.5)
Chloride: 97 mEq/L (ref 96–112)
Creatinine, Ser: 0.51 mg/dL (ref 0.50–1.10)
Glucose, Bld: 306 mg/dL — ABNORMAL HIGH (ref 70–99)

## 2012-02-12 LAB — GLUCOSE, CAPILLARY
Glucose-Capillary: 239 mg/dL — ABNORMAL HIGH (ref 70–99)
Glucose-Capillary: 321 mg/dL — ABNORMAL HIGH (ref 70–99)

## 2012-02-12 LAB — CBC
HCT: 34.7 % — ABNORMAL LOW (ref 36.0–46.0)
Hemoglobin: 11.8 g/dL — ABNORMAL LOW (ref 12.0–15.0)
MCH: 27 pg (ref 26.0–34.0)
RBC: 4.37 MIL/uL (ref 3.87–5.11)

## 2012-02-12 LAB — PLATELET INHIBITION P2Y12: Platelet Function  P2Y12: 189 [PRU] — ABNORMAL LOW (ref 194–418)

## 2012-02-12 LAB — PROTIME-INR: Prothrombin Time: 13.7 seconds (ref 11.6–15.2)

## 2012-02-12 MED ORDER — SODIUM CHLORIDE 0.9 % IV SOLN: 250.0000 mL | INTRAVENOUS | Status: DC | PRN

## 2012-02-12 MED ORDER — SODIUM CHLORIDE 0.9 % IV SOLN
INTRAVENOUS | Status: DC
  Administered 2012-02-13: 07:00:00 via INTRAVENOUS

## 2012-02-12 MED ORDER — DIAZEPAM 2 MG PO TABS
2.0000 mg | ORAL_TABLET | ORAL | Status: AC
  Administered 2012-02-13: 2 mg via ORAL
  Filled 2012-02-12: qty 1

## 2012-02-12 MED ORDER — INSULIN GLARGINE 100 UNIT/ML ~~LOC~~ SOLN
25.0000 [IU] | Freq: Every day | SUBCUTANEOUS | Status: DC
  Administered 2012-02-12: 25 [IU] via SUBCUTANEOUS

## 2012-02-12 MED ORDER — ASPIRIN 81 MG PO CHEW
324.0000 mg | CHEWABLE_TABLET | ORAL | Status: AC
  Administered 2012-02-13: 324 mg via ORAL
  Filled 2012-02-12: qty 4

## 2012-02-12 MED ORDER — SODIUM CHLORIDE 0.9 % IJ SOLN: 3.0000 mL | INTRAMUSCULAR | Status: DC | PRN

## 2012-02-12 MED ORDER — DEXTROSE 5 % IV SOLN
1.0000 g | INTRAVENOUS | Status: DC
  Administered 2012-02-12: 1 g via INTRAVENOUS
  Filled 2012-02-12 (×2): qty 10

## 2012-02-12 MED ORDER — SODIUM CHLORIDE 0.9 % IJ SOLN
3.0000 mL | Freq: Two times a day (BID) | INTRAMUSCULAR | Status: DC
  Administered 2012-02-12 (×2): 3 mL via INTRAVENOUS

## 2012-02-12 NOTE — Progress Notes (Signed)
SUBJECTIVE:  No complaints this am - currently afebrile  OBJECTIVE:   Vitals:   Filed Vitals:   02/11/12 1015 02/11/12 2100 02/12/12 0600 02/12/12 0945  BP: 110/74 111/72 117/79 109/73  Pulse: 92 87 97 91  Temp:  99.6 F (37.6 C) 98.9 F (37.2 C)   TempSrc:      Resp:  14 14   Height:      Weight:      SpO2:  100% 98%    I&O's:   Intake/Output Summary (Last 24 hours) at 02/12/12 1007 Last data filed at 02/12/12 0945  Gross per 24 hour  Intake      6 ml  Output      0 ml  Net      6 ml   TELEMETRY: Reviewed telemetry pt in NSR     PHYSICAL EXAM General: Well developed, well nourished, in no acute distress Head: Eyes PERRLA, No xanthomas.   Normal cephalic and atramatic  Lungs:   Clear bilaterally to auscultation and percussion. Heart:   HRRR S1 S2 Pulses are 2+ & equal. Abdomen: Bowel sounds are positive, abdomen soft and non-tender without masses  Extremities:   No clubbing, cyanosis or edema.  DP +1 Neuro: Alert and oriented X 3. Psych:  Good affect, responds appropriately   LABS: Basic Metabolic Panel:  Basename 02/10/12 0750  NA 134*  K 3.4*  CL 99  CO2 19  GLUCOSE 296*  BUN 8  CREATININE 0.49*  CALCIUM 8.8  MG --  PHOS --   Liver Function Tests: No results found for this basename: AST:2,ALT:2,ALKPHOS:2,BILITOT:2,PROT:2,ALBUMIN:2 in the last 72 hours No results found for this basename: LIPASE:2,AMYLASE:2 in the last 72 hours CBC:  Basename 02/11/12 0545  WBC 7.4  NEUTROABS --  HGB 10.9*  HCT 32.6*  MCV 78.7  PLT 236     RADIOLOGY: Dg Chest Portable 1 View  02/08/2012  *RADIOLOGY REPORT*  Clinical Data: Flu like symptoms.  Chest pain and shortness of breath.  PORTABLE CHEST - 1 VIEW  Comparison: None.  Findings: The heart size is normal.  The lung volumes are low. This exaggerates the pulmonary interstitium.  No focal airspace disease is evident.  The visualized soft tissues and bony thorax are unremarkable.  IMPRESSION: No acute  cardiopulmonary disease.   Original Report Authenticated By: Marin Roberts, M.D.    Assessment/Plan:  Active Problems:  NSTEMI (non-ST elevated myocardial infarction)  Hypertriglyceridemia  UTI (lower urinary tract infection)  Flu-like symptoms  Diabetes mellitus type II, uncontrolled  46 year old with DKA, Troponin as high as 8 with MB of 12, recent dyspnea, flu like symptoms. Feeling much better.  1. NSTEMI - Trop trended down. Greater than 24 hours full dose Lovenox, will change to 40mg  QD, DVT proph. Dose.  Will continue Bb. Statin. ASA. Possible type 2. ECG changes at first with 1, L ST elevation, resolved. 2D echo with normal LVF.  Will plan tentatively for cath in am as long as she remains afebrile 2. DKA - improved.  3. Tachycardia - reactive to infection/fever - now resolved 4. TOBACCO cessation.  5.  Gram negative rod bacteremia- now afebrile - discussed with ID timing of cath - as long as afebrile for 24 hours ok to proceed with cath. 6.  Cardiac catheterization was discussed with the patient fully including risks on myocardial infarction, death, stroke, bleeding, arrhythmia, dye allergy, renal insufficiency or bleeding.  All patient questions and concerns were discussed and the patient understands and  is willing to proceed.            Quintella Reichert, MD  02/12/2012  10:07 AM

## 2012-02-12 NOTE — Progress Notes (Signed)
[facility]             Progress Note   Subjective: No complaints.  Objective: Vital signs in last 24 hours: Temp:  [98.9 F (37.2 C)-99.6 F (37.6 C)] 98.9 F (37.2 C) (12/22 0600) Pulse Rate:  [87-97] 91  (12/22 0945) Resp:  [14] 14  (12/22 0600) BP: (109-117)/(72-79) 109/73 mmHg (12/22 0945) SpO2:  [98 %-100 %] 98 % (12/22 0600) Weight change:  Last BM Date: 02/11/12  Intake/Output from previous day: 12/21 0701 - 12/22 0700 In: 243 [P.O.:240; I.V.:3] Out: -  Total I/O In: 3 [I.V.:3] Out: -    Physical Exam: General: Alert, awake, oriented x3, in no acute distress. HEENT: No bruits, no goiter. Heart: Regular rate and rhythm, without murmurs, rubs, gallops. Lungs: Clear to auscultation bilaterally. Abdomen: Soft, nontender, nondistended, positive bowel sounds. Extremities: No clubbing cyanosis or edema with positive pedal pulses. Neuro: Grossly intact, nonfocal.    Lab Results: Basic Metabolic Panel:  [facility] 02/10/12 0750  NA 134*  K 3.4*  CL 99  CO2 19  GLUCOSE 296*  BUN 8  CREATININE 0.49*  CALCIUM 8.8  MG --  PHOS --   CBC:  Basename 02/11/12 0545  WBC 7.4  NEUTROABS --  HGB 10.9*  HCT 32.6*  MCV 78.7  PLT 236   CBG:  Basename 02/12/12 0723 02/11/12 2049 02/11/12 1727 02/11/12 1156 02/11/12 0741 02/10/12 2135  GLUCAP 254* 187* 280* 315* 290* 296*     Recent Results (from the past 240 hour(s))  URINE CULTURE     Status: Normal   Collection Time   02/08/12 12:51 PM      Component Value Range Status Comment   Specimen Description URINE, CLEAN CATCH   Final    Special Requests NONE   Final    Culture  Setup Time 02/08/2012 15:18   Final    Colony Count 20,OOO COLONIES/ML   Final    Culture     Final    Value: Multiple bacterial morphotypes present, none predominant. Suggest appropriate recollection if clinically indicated.   Report Status 02/09/2012 FINAL   Final   MRSA PCR SCREENING     Status: Normal   Collection  Time   02/08/12  7:25 PM      Component Value Range Status Comment   MRSA by PCR NEGATIVE  NEGATIVE Final   CULTURE, BLOOD (ROUTINE X 2)     Status: Normal   Collection Time   02/09/12  9:00 AM      Component Value Range Status Comment   Specimen Description BLOOD ARM RIGHT   Final    Special Requests BOTTLES DRAWN AEROBIC AND ANAEROBIC 10CC   Final    Culture  Setup Time 02/09/2012 14:00   Final    Culture     Final    Value: ESCHERICHIA COLI     Note: Gram Stain Report Called to,Read Back By and Verified With: JUDITH K@1236  ON 122013 BY [facility]   Report Status 02/12/2012 FINAL   Final    Organism ID, Bacteria ESCHERICHIA COLI   Final   CULTURE, BLOOD (ROUTINE X 2)     Status: Normal (Preliminary result)   Collection Time   02/09/12  9:10 AM      Component Value Range Status Comment   Specimen Description BLOOD HAND LEFT   Final    Special Requests BOTTLES DRAWN AEROBIC ONLY 10CC   Final    Culture  Setup Time 02/09/2012 14:00  Final    Culture     Final    Value: ESCHERICHIA COLI     Note: Gram Stain Report Called to,Read Back By and Verified With: Melton Alar 02/11/12 @ 9:35PM BY RUSCA.   Report Status PENDING   Incomplete     Studies/Results: No results found.  Medications: Scheduled Meds:    . aspirin  325 mg Oral Daily  . atorvastatin  80 mg Oral q1800  . enoxaparin (LOVENOX) injection  40 mg Subcutaneous Q24H  . insulin aspart  0-9 Units Subcutaneous TID WC  . insulin aspart  4 Units Subcutaneous TID WC  . insulin glargine  20 Units Subcutaneous QHS  . living well with diabetes book   Does not apply Once  . metoprolol tartrate  25 mg Oral BID  . piperacillin-tazobactam (ZOSYN)  IV  3.375 g Intravenous Q8H  . senna-docusate  1 tablet Oral BID  . sodium chloride  3 mL Intravenous Q12H   Continuous Infusions:    . sodium chloride     PRN Meds:.acetaminophen, acetaminophen, dextrose, ondansetron (ZOFRAN) IV, ondansetron  Assessment/Plan:  Active  Problems:  NSTEMI (non-ST elevated myocardial infarction)  Hypertriglyceridemia  UTI (lower urinary tract infection)  Flu-like symptoms  Diabetes mellitus type II, uncontrolled  Bacteremia due to Gram-negative bacteria   NSTEMI -For cardiac cath. -Timing as per cards. -ECHO with:  Study Conclusions  Left ventricle: The cavity size was normal. Systolic function was normal. The estimated ejection fraction was in the range of 55% to 60%. Although no diagnostic regional wall motion abnormality was identified, this possibility cannot be completely excluded on the basis of this study.  DKA -Resolved. -CBGs improved. -Will increase lantus to 25 from 20.  E Coli Bacteremia -Afebrile overnight. -Pansensitive. -Has received 4 days of IV antibiotics. -Will place back on Rocephin. -Suspect urinary source altho initial cx with mult morphotypes. -Have resent urine cx. -Would treat with IV abx for at least 5 days.     Time spent coordinating care: 25 minutes   LOS: 4 days   HERNANDEZ ACOSTA,Idrissa Beville [facility] Pager: [phone] 02/12/2012, 11:04 AM

## 2012-02-13 ENCOUNTER — Encounter (HOSPITAL_COMMUNITY)
Admission: EM | Disposition: A | Discharge status: Home / Self Care | Payer: Self-pay | Source: Home / Self Care | Attending: Internal Medicine

## 2012-02-13 ENCOUNTER — Other Ambulatory Visit: Payer: Self-pay

## 2012-02-13 DIAGNOSIS — A419 Sepsis, unspecified organism: Secondary | ICD-10-CM | POA: Diagnosis present

## 2012-02-13 HISTORY — PX: LEFT HEART CATHETERIZATION WITH CORONARY ANGIOGRAM: SHX5451

## 2012-02-13 LAB — CULTURE, BLOOD (ROUTINE X 2)

## 2012-02-13 LAB — BASIC METABOLIC PANEL
BUN: 6 mg/dL (ref 6–23)
CO2: 23 mEq/L (ref 19–32)
Calcium: 9.1 mg/dL (ref 8.4–10.5)
Chloride: 94 mEq/L — ABNORMAL LOW (ref 96–112)
GFR calc Af Amer: >90 mL/min (ref >90)
GFR calc non Af Amer: >90 mL/min (ref >90)
Glucose, Bld: 296 mg/dL — ABNORMAL HIGH (ref 70–99)
Sodium: 130 mEq/L — ABNORMAL LOW (ref 135–145)

## 2012-02-13 LAB — GLUCOSE, CAPILLARY
Glucose-Capillary: 107 mg/dL — ABNORMAL HIGH (ref 70–99)
Glucose-Capillary: 187 mg/dL — ABNORMAL HIGH (ref 70–99)

## 2012-02-13 LAB — URINE CULTURE: Colony Count: 3000

## 2012-02-13 LAB — CBC
Hemoglobin: 12.4 g/dL (ref 12.0–15.0)
MCHC: 34.3 g/dL (ref 30.0–36.0)
Platelets: 357 10*3/uL (ref 150–400)
RDW: 13.8 % (ref 11.5–15.5)

## 2012-02-13 LAB — MRSA PCR SCREENING: MRSA by PCR: NEGATIVE

## 2012-02-13 LAB — PREGNANCY, URINE: Preg Test, Ur: NEGATIVE

## 2012-02-13 SURGERY — LEFT HEART CATHETERIZATION WITH CORONARY ANGIOGRAM
Anesthesia: LOCAL

## 2012-02-13 MED ORDER — INSULIN GLARGINE 100 UNIT/ML ~~LOC~~ SOLN
5.0000 [IU] | Freq: Once | SUBCUTANEOUS | Status: AC
  Administered 2012-02-13: 5 [IU] via SUBCUTANEOUS

## 2012-02-13 MED ORDER — LIDOCAINE HCL (PF) 1 % IJ SOLN
INTRAMUSCULAR | Status: AC
  Filled 2012-02-13: qty 30

## 2012-02-13 MED ORDER — VERAPAMIL HCL 2.5 MG/ML IV SOLN
INTRAVENOUS | Status: AC
  Filled 2012-02-13: qty 2

## 2012-02-13 MED ORDER — FENTANYL CITRATE 0.05 MG/ML IJ SOLN
INTRAMUSCULAR | Status: AC
  Filled 2012-02-13: qty 2

## 2012-02-13 MED ORDER — INSULIN GLARGINE 100 UNIT/ML ~~LOC~~ SOLN
35.0000 [IU] | Freq: Every day | SUBCUTANEOUS | Status: DC
  Administered 2012-02-13: 35 [IU] via SUBCUTANEOUS

## 2012-02-13 MED ORDER — BIVALIRUDIN 250 MG IV SOLR
INTRAVENOUS | Status: AC
  Filled 2012-02-13: qty 250

## 2012-02-13 MED ORDER — SODIUM CHLORIDE 0.9 % IV SOLN
1.0000 mL/kg/h | INTRAVENOUS | Status: AC
  Administered 2012-02-13: 1 mL/kg/h via INTRAVENOUS

## 2012-02-13 MED ORDER — TICAGRELOR 90 MG PO TABS
ORAL_TABLET | ORAL | Status: AC
  Filled 2012-02-13: qty 2

## 2012-02-13 MED ORDER — MIDAZOLAM HCL 2 MG/2ML IJ SOLN
INTRAMUSCULAR | Status: AC
  Filled 2012-02-13: qty 2

## 2012-02-13 MED ORDER — ONDANSETRON HCL 4 MG/2ML IJ SOLN: 4.0000 mg | Freq: Four times a day (QID) | INTRAMUSCULAR | Status: DC | PRN

## 2012-02-13 MED ORDER — HEPARIN SODIUM (PORCINE) 1000 UNIT/ML IJ SOLN
INTRAMUSCULAR | Status: AC
  Filled 2012-02-13: qty 1

## 2012-02-13 MED ORDER — DEXTROSE 5 % IV SOLN
1.0000 g | INTRAVENOUS | Status: AC
  Administered 2012-02-13: 1 g via INTRAVENOUS
  Filled 2012-02-13: qty 10

## 2012-02-13 MED ORDER — ACETAMINOPHEN 325 MG PO TABS
650.0000 mg | ORAL_TABLET | ORAL | Status: DC | PRN
  Administered 2012-02-13: 650 mg via ORAL
  Filled 2012-02-13: qty 2

## 2012-02-13 MED ORDER — CIPROFLOXACIN HCL 500 MG PO TABS
500.0000 mg | ORAL_TABLET | Freq: Two times a day (BID) | ORAL | Status: DC
  Administered 2012-02-13 – 2012-02-14 (×3): 500 mg via ORAL
  Filled 2012-02-13 (×5): qty 1

## 2012-02-13 MED ORDER — TICAGRELOR 90 MG PO TABS
90.0000 mg | ORAL_TABLET | Freq: Two times a day (BID) | ORAL | Status: DC
  Administered 2012-02-13 – 2012-02-14 (×3): 90 mg via ORAL
  Filled 2012-02-13 (×6): qty 1

## 2012-02-13 MED ORDER — ASPIRIN 81 MG PO CHEW: 81.0000 mg | CHEWABLE_TABLET | Freq: Every day | ORAL | Status: DC

## 2012-02-13 MED ORDER — NITROGLYCERIN 0.2 MG/ML ON CALL CATH LAB
INTRAVENOUS | Status: AC
  Filled 2012-02-13: qty 1

## 2012-02-13 MED ORDER — HEPARIN (PORCINE) IN NACL 2-0.9 UNIT/ML-% IJ SOLN
INTRAMUSCULAR | Status: AC
  Filled 2012-02-13: qty 1000

## 2012-02-13 NOTE — CV Procedure (Signed)
PROCEDURE:  PCI LAD  INDICATIONS:  NSTEMI  The risks, benefits, and details of the procedure were explained to the patient.  The patient verbalized understanding and wanted to proceed.  Informed written consent was obtained.  PROCEDURE TECHNIQUE:  Dr. Anne Fu performed the diagnostic cath and this revealed a 95% mid LAD , diffuse lesion. Left coronary angiography was done using a CLS 3.0 guide catheter.  Angiomax was used for anticoagulation.  An ACT was used to check that the Angiomax was therapeutic.  Several doses of nitroglycerin were administered in the LAD to help with vasospasm.  At the end of the case, intra-arterial nitroglycerin was administered in the right radial artery.  A TR band was used for hemostasis in the right wrist.   CONTRAST:  Total of 155 cc for the entire procedure.  COMPLICATIONS:  None.     ANGIOGRAPHIC DATA:     The left anterior descending artery is diffusely diseased in the mid portion, up to 95%.  PCI NARRATIVE: A CLS 3.0 guiding catheters used to engage the left main.  There was some difficulty in accessing the left main with this catheter.  A pro-water wire was placed across the area disease in the LAD.  A 2.5 x 20 Empira balloon was used to predilate the lesion.  A 2.75 x 38 Promus drug-eluting stent was placed in the more distal area of disease.  A 3.0 x 16 Promus drug-eluting stent was then placed in overlapping fashion more proximally.  The entire area was stented with a 3.5 x 12 noncompliant Empira balloon.  There is no residual stenosis.  The diagonal branches proximal to the stented area appear patent.  The area of ostial LAD disease appeared unchanged after the intervention.   IMPRESSIONS:  1. Successful PCI to the mid left anterior descending artery with overlapping drug eluting stents, 2.75 x 38 mm and 3.0 x 16 mm.  The entire stented area was postdilated with a 3.5 mm balloon.  RECOMMENDATION:  Continue dual antiplatelet therapy for at least one  year and likely longer.  She will need aggressive risk factor modification as well including aggressive diabetes control.  She'll need to stop smoking as well.  She will followup with Dr. Anne Fu.  Antianginal therapy will also be maximized.

## 2012-02-13 NOTE — Progress Notes (Signed)
[facility] PROGRESS NOTE Assessment/Plan: NSTEMI (non-ST elevated myocardial infarction) (02/08/2012) - cardiac cath: PCI to the mid left anterior descending artery with overlapping drug eluting stents 12.23.2013. - 12.23.2013 ECHO with: preserved EF no wall motion. - ASA, statins and Brilliant  Hypertriglyceridemia (02/09/2012) - statins. - follow up with PCP.  DKA/Diabetes mellitus type II, uncontrolled (02/10/2012) - Resolved.  - CBGs improved.  - Will increase lantus to 35. - resume metformin as an outpatient.  Sepsis/UTI/Bacteremia due to Gram-negative bacteria (02/10/2012) -Afebrile overnight.  -Has received 5 days of IV antibiotics. Change to Cipro Oral for 14 days. - BP stable, no pressor required.   Code Status: full Family Communication: husband  Disposition Plan: home in am   Consultants:  cardiology  Procedures: 12.20.2013 echo : ejection fraction was in the range of 55% to 60%. Although no diagnostic regional wall motion abnormality     Antibiotics:  Rocephin 12.18.2013>>12.23.2013  Cipro 12.24.20123  HPI/Subjective: No complains.  Objective: Filed Vitals:   02/12/12 1500 02/12/12 2130 02/13/12 0600 02/13/12 0932  BP: 110/74 122/81 114/73   Pulse: 90 96 85   Temp: 98.1 F (36.7 C) 99.8 F (37.7 C) 99.7 F (37.6 C) 98.7 F (37.1 C)  TempSrc:    Oral  Resp: 18 16 14    Height:      Weight:      SpO2: 98% 99% 100%     Intake/Output Summary (Last 24 hours) at 02/13/12 1029 Last data filed at 02/12/12 1735  Gross per 24 hour  Intake    246 ml  Output      0 ml  Net    246 ml   Filed Weights   02/09/12 0511  Weight: 75.8 kg (167 lb 1.7 oz)    Exam:  General: Alert, awake, oriented x3, in no acute distress.  HEENT: No bruits, no goiter.  Heart: Regular rate and rhythm, without murmurs, rubs, gallops.  Lungs: Good air movement, bilateral air movement.  Abdomen: Soft, nontender, nondistended, positive bowel sounds.     Data Reviewed: Basic Metabolic Panel:  Lab 02/13/12 3086 02/12/12 1556 02/10/12 0750 02/09/12 0303 02/08/12 2100  NA 130* 134* 134* 131*131* 133*  K 3.4* 3.7 3.4* 3.83.7 4.2  CL 94* 97 99 101101 101  CO2 23 23 19  1918* 16*  GLUCOSE 296* 306* 296* 135*134* 225*  BUN 6 9 8  1211 13  CREATININE 0.50 0.51 0.49* 0.510.49* 0.51  CALCIUM 9.1 9.2 8.8 9.29.2 9.7  MG -- -- -- -- --  PHOS -- -- -- -- --   Liver Function Tests:  Lab 02/09/12 0303 02/08/12 1251  AST 27 45*  ALT 17 23  ALKPHOS 240* 309*  BILITOT 0.4 0.4  PROT 6.9 8.8*  ALBUMIN 2.9* 3.7    Lab 02/08/12 1516  LIPASE 25  AMYLASE --   No results found for this basename: AMMONIA:5 in the last 168 hours CBC:  Lab 02/13/12 0550 02/12/12 1556 02/11/12 0545 02/09/12 0303 02/08/12 2100 02/08/12 1251  WBC 10.5 8.4 7.4 9.8 12.7* --  NEUTROABS -- -- -- -- -- 10.5*  HGB 12.4 11.8* 10.9* 12.7 13.2 --  HCT 36.2 34.7* 32.6* 36.1 37.8 --  MCV 79.7 79.4 78.7 78.8 78.6 --  PLT 357 324 236 207 228 --   Cardiac Enzymes:  Lab 02/09/12 0908 02/09/12 0303 02/08/12 2101 02/08/12 1653 02/08/12 1518  CKTOTAL 158 191* 260* -- --  CKMB 5.6* 7.4* 12.1* -- --  CKMBINDEX -- -- -- -- --  TROPONINI 4.54*  7.09* 7.20* 8.46* 7.56*   BNP (last 3 results) No results found for this basename: PROBNP:3 in the last 8760 hours CBG:  Lab 02/13/12 0918 02/13/12 0658 02/12/12 2112 02/12/12 1652 02/12/12 1232  GLUCAP 236* 284* 248* 321* 239*    Recent Results (from the past 240 hour(s))  URINE CULTURE     Status: Normal   Collection Time   02/08/12 12:51 PM      Component Value Range Status Comment   Specimen Description URINE, CLEAN CATCH   Final    Special Requests NONE   Final    Culture  Setup Time 02/08/2012 15:18   Final    Colony Count 20,OOO COLONIES/ML   Final    Culture     Final    Value: Multiple bacterial morphotypes present, none predominant. Suggest appropriate recollection if clinically indicated.   Report Status  02/09/2012 FINAL   Final   MRSA PCR SCREENING     Status: Normal   Collection Time   02/08/12  7:25 PM      Component Value Range Status Comment   MRSA by PCR NEGATIVE  NEGATIVE Final   CULTURE, BLOOD (ROUTINE X 2)     Status: Normal   Collection Time   02/09/12  9:00 AM      Component Value Range Status Comment   Specimen Description BLOOD ARM RIGHT   Final    Special Requests BOTTLES DRAWN AEROBIC AND ANAEROBIC 10CC   Final    Culture  Setup Time 02/09/2012 14:00   Final    Culture     Final    Value: ESCHERICHIA COLI     Note: Gram Stain Report Called to,Read Back By and Verified With: JUDITH K@1236  ON 122013 BY [facility]   Report Status 02/12/2012 FINAL   Final    Organism ID, Bacteria ESCHERICHIA COLI   Final   CULTURE, BLOOD (ROUTINE X 2)     Status: Normal   Collection Time   02/09/12  9:10 AM      Component Value Range Status Comment   Specimen Description BLOOD HAND LEFT   Final    Special Requests BOTTLES DRAWN AEROBIC ONLY 10CC   Final    Culture  Setup Time 02/09/2012 14:00   Final    Culture     Final    Value: ESCHERICHIA COLI     Note: Gram Stain Report Called to,Read Back By and Verified With: Melton Alar 02/11/12 @ 9:35PM BY RUSCA.   Report Status 02/13/2012 FINAL   Final    Organism ID, Bacteria ESCHERICHIA COLI   Final   URINE CULTURE     Status: Normal   Collection Time   02/11/12 10:01 PM      Component Value Range Status Comment   Specimen Description URINE, CLEAN CATCH   Final    Special Requests PATIENT ON FOLLOWING ROCEPHIN   Final    Culture  Setup Time 02/12/2012 06:15   Final    Colony Count 3,000 COLONIES/ML   Final    Culture INSIGNIFICANT GROWTH   Final    Report Status 02/13/2012 FINAL   Final      Studies: No results found.  Scheduled Meds:   . aspirin  81 mg Oral Daily  . atorvastatin  80 mg Oral q1800  . cefTRIAXone (ROCEPHIN)  IV  1 g Intravenous Q24H  . insulin aspart  0-9 Units Subcutaneous TID WC  . insulin aspart  4 Units  Subcutaneous TID WC  .  insulin glargine  25 Units Subcutaneous QHS  . living well with diabetes book   Does not apply Once  . metoprolol tartrate  25 mg Oral BID  . senna-docusate  1 tablet Oral BID  . sodium chloride  3 mL Intravenous Q12H  . Ticagrelor  90 mg Oral BID   Continuous Infusions:   . sodium chloride 1 mL/kg/hr (02/13/12 1000)     Marinda Elk  [facility] Pager [phone]. If 8PM-8AM, please contact night-coverage at [email or web address], password [facility] 02/13/2012, 10:29 AM  LOS: 5 days

## 2012-02-13 NOTE — CV Procedure (Signed)
CARDIAC CATHETERIZATION  PROCEDURE:  Left heart catheterization with selective coronary angiography, left ventricular pressures via the radial artery approach.  INDICATIONS:  46 year old female was admitted with diabetic ketoacidosis and was also found to have troponin elevation of 7/8 with MB elevation of 12. Original EKG demonstrated ST segment changes and 1 and aVL, elevation, that resolved with IV heparin. She was chest pain-free from her initial ER evaluation however she was quite ill/acidotic in regards to her DKA. Her DKA resolved. Blood cultures demonstrated E. coli likely urinary source. She has been on IV antibiotics for approximately 4 days. She has been afebrile for greater than 24 hours. After discussion with infectious disease by Dr. Mayford Knife yesterday, it was felt reasonable to proceed with cardiac catheterization since she has been afebrile for this period of time with IV antibiotics. Prior to hospitalization she did admit to having anginal-like symptoms, chest burning, shortness of breath when walking. Please see H&P for full details.  The risks, benefits, and details of the procedure were explained to the patient, including possibilities of stroke, heart attack, death, renal impairment, arterial damage, bleeding.  The patient verbalized understanding and wanted to proceed.  Informed written consent was obtained.  PROCEDURE TECHNIQUE:  Allen's test was performed pre-and post procedure and was normal. The right radial artery site was prepped and draped in a sterile fashion. One percent lidocaine was used for local anesthesia. Using the modified Seldinger technique a 5 French hydrophilic sheath was inserted into the radial artery without difficulty. 3 mg of verapamil was administered via the sheath. A Judkins right #4 catheter with the guidance of a Versicore wire was placed in the right coronary cusp and selectively cannulated the right coronary artery. After traversing the aortic arch, 3500  units of heparin IV was administered. A Judkins left #3.5 catheter was used to selectively cannulate the left main artery. Multiple views with hand injection of Omnipaque were obtained. Following the procedure, intervention to LAD was planned.   CONTRAST:  Total of 70 ml.    FLOUROSCOPY TIME: 3.8 min.  COMPLICATIONS:  None.    HEMODYNAMICS:  Aortic pressure was 123/53mmHg; LV systolic pressure was ; LVEDP .  There was no gradient between the left ventricle and aorta.    ANGIOGRAPHIC DATA:    Left main: No significant luminal irregularities, mild tapering at the trifurcation of the LAD, ramus and circumflex branch.  Left anterior descending (LAD): There is a significant 90-95% mid LAD lesion with disease surrounding this lesion approximately 60-70%. Long area of disease approximately 30 mm.. There 2 diagonal branches the first of which is tortuous and has a 95% proximal/mid lesion. Fairly small in caliber. The second of which is small in caliber distally.  Circumflex artery (CIRC): There is a moderate-sized ramus branch with moderate diffuse disease. The AV groove circumflex is small in caliber and appears to be occluded in its midsection with right to left/left to left collaterals.  Right coronary artery (RCA): This vessel is the dominant vessel giving rise to the posterior descending artery. Moderate diffuse disease, nonflow limiting.  LEFT VENTRICULOGRAM: Not performed however Judkins right catheter was inserted into the left ventricle, pressures obtained and pullback obtained. There was no gradient across the aortic valve.   IMPRESSIONS:  95% mid LAD lesion, 100% occluded mid AV groove circumflex, small vessel, diffuse diagonal disease, small caliber vessel. Normal left ventricular systolic function.  LVEDP 23 mmHg.  Ejection fraction not performed, however during echocardiogram EF was normal 55%.  RECOMMENDATION:  Discussed  findings with patient as well as Dr.Varanasi.   Given the significance of the LAD disease, this was discussed at length with patient describing possible therapeutic options. Proceed with mid LAD stent. The remaining residual disease was felt to be in very small caliber vessels. AV groove circumflex was not felt to be amenable to bypass. Risks of emergency surgery discussed.

## 2012-02-13 NOTE — Interval H&P Note (Signed)
History and Physical Interval Note:  02/13/2012 7:35 AM  Vickie Little  has presented today for surgery, with the diagnosis of NSTEMI  The various methods of treatment have been discussed with the patient and family. After consideration of risks, benefits and other options for treatment, the patient has consented to  Procedure(s) (LRB) with comments: LEFT HEART CATHETERIZATION WITH CORONARY ANGIOGRAM (N/A) as a surgical intervention .  The patient's history has been reviewed, patient examined, no change in status, stable for surgery.  I have reviewed the patient's chart and labs.  Questions were answered to the patient's satisfaction.     SKAINS, MARK  I have discussed with her cardiac cath. Risk of infection discussed by Dr. Mayford Knife with ID and since she has been afebrile on IV antibiotics for greater than 25 hours OK to proceed. Understands risks (CVA, MI, death, bleeding). Will use radial approach.

## 2012-02-13 NOTE — Care Management Note (Addendum)
    Page 1 of 1   02/14/2012     12:32:39 PM   CARE MANAGEMENT NOTE 02/14/2012  Patient:  Vickie Little, Vickie Little   Account Number:  [id number]  Date Initiated:  02/13/2012  Documentation initiated by:  Junius Creamer  Subjective/Objective Assessment:   adm w mi     Action/Plan:   lives w husband, pt states she does have some ins to cover meds but it doesn't pay well,   Anticipated DC Date:  02/14/2012   Anticipated DC Plan:  HOME/SELF CARE      DC Planning Services  CM consult  Medication Assistance      Choice offered to / List presented to:             Status of service:  Completed, signed off Medicare Important Message given?   (If response is "NO", the following Medicare IM given date fields will be blank) Date Medicare IM given:   Date Additional Medicare IM given:    Discharge Disposition:  HOME/SELF CARE  Per UR Regulation:  Reviewed for med. necessity/level of care/duration of stay  If discussed at Long Length of Stay Meetings, dates discussed:   02/14/2012    Comments:  02/14/12- 1100- Donn Pierini RN, BSN [phone] Pt for d/c today, spoke with pt in room regarding brilinta needs- MD signed assistance forms- given to pt along with script and explained need to fill out and mail as soon as she could. Pt already had been given 30 day free card- has 30 script to go with it and has copay assist card along with other prescription card that may help her with her brand name meds. Call made to [facility] on [address] which has Brilinta 90 mg in stock- pt made aware of this for pharmacy location that has drug in stock.  12/23 1144a debbie dowell rn,bsn [phone] gave pt 30day free brilinta card and copay assist card. also gave pt prescription card that may help w copays for brand name meds. placed pt assist form on chart for brilinta for md to sign.

## 2012-02-14 DIAGNOSIS — E1110 Type 2 diabetes mellitus with ketoacidosis without coma: Secondary | ICD-10-CM

## 2012-02-14 DIAGNOSIS — I214 Non-ST elevation (NSTEMI) myocardial infarction: Secondary | ICD-10-CM

## 2012-02-14 LAB — BASIC METABOLIC PANEL
Calcium: 8.6 mg/dL (ref 8.4–10.5)
Chloride: 100 mEq/L (ref 96–112)
Creatinine, Ser: 0.47 mg/dL — ABNORMAL LOW (ref 0.50–1.10)
GFR calc Af Amer: >90 mL/min (ref >90)

## 2012-02-14 LAB — CBC
HCT: 33 % — ABNORMAL LOW (ref 36.0–46.0)
Hemoglobin: 11 g/dL — ABNORMAL LOW (ref 12.0–15.0)
MCHC: 33.3 g/dL (ref 30.0–36.0)
RBC: 4.12 MIL/uL (ref 3.87–5.11)

## 2012-02-14 LAB — GLUCOSE, CAPILLARY: Glucose-Capillary: 256 mg/dL — ABNORMAL HIGH (ref 70–99)

## 2012-02-14 MED ORDER — INSULIN GLARGINE 100 UNIT/ML ~~LOC~~ SOLN: 40.0000 [IU] | Freq: Every day | SUBCUTANEOUS | Status: DC

## 2012-02-14 MED ORDER — TICAGRELOR 90 MG PO TABS: 90.0000 mg | ORAL_TABLET | Freq: Two times a day (BID) | ORAL | Status: DC

## 2012-02-14 MED ORDER — METOPROLOL TARTRATE 25 MG PO TABS: 25.0000 mg | ORAL_TABLET | Freq: Two times a day (BID) | ORAL | Status: DC

## 2012-02-14 MED ORDER — ATORVASTATIN CALCIUM 80 MG PO TABS: 80.0000 mg | ORAL_TABLET | Freq: Every day | ORAL | Status: DC

## 2012-02-14 MED ORDER — ASPIRIN 81 MG PO CHEW: 324.0000 mg | CHEWABLE_TABLET | Freq: Every day | ORAL | Status: DC

## 2012-02-14 MED ORDER — CIPROFLOXACIN HCL 500 MG PO TABS: 500.0000 mg | ORAL_TABLET | Freq: Two times a day (BID) | ORAL | Status: DC

## 2012-02-14 MED ORDER — ASPIRIN 81 MG PO CHEW
324.0000 mg | CHEWABLE_TABLET | Freq: Every day | ORAL | Status: DC
  Administered 2012-02-14: 324 mg via ORAL
  Filled 2012-02-14: qty 4

## 2012-02-14 MED ORDER — POTASSIUM CHLORIDE CRYS ER 20 MEQ PO TBCR
40.0000 meq | EXTENDED_RELEASE_TABLET | Freq: Two times a day (BID) | ORAL | Status: DC
  Administered 2012-02-14: 40 meq via ORAL
  Filled 2012-02-14: qty 2

## 2012-02-14 NOTE — Progress Notes (Signed)
SUBJECTIVE:  She walked with cardiac rehabilitation this morning.  No chest discomfort.  This level of activity would have caused her symptoms prior to her stent.  OBJECTIVE:   Vitals:   Filed Vitals:   02/13/12 1719 02/13/12 2057 02/13/12 2338 02/14/12 0657  BP: 118/77 107/69  104/68  Pulse: 86 94  3  Temp: 100.1 F (37.8 C) 101.5 F (38.6 C) 99.1 F (37.3 C) 98.1 F (36.7 C)  TempSrc:  Oral  Oral  Resp: 20 18    Height:      Weight:    78.472 kg (173 lb)  SpO2: 100% 97%  99%   I&O's:   Intake/Output Summary (Last 24 hours) at 02/14/12 1610 Last data filed at 02/14/12 0700  Gross per 24 hour  Intake 1189.19 ml  Output      0 ml  Net 1189.19 ml   :     PHYSICAL EXAM General: Well developed, well nourished, in no acute distress Head:   Normal cephalic and atramatic  Lungs:   Clear bilaterally to auscultation and percussion. Heart:  HRRR S1 S2   Normal strength and tone for age. Extremities:  No  edema.  2+ right radial pulse.  No hematoma Neuro: Alert and oriented X 3. Psych:  Normal affect, responds appropriately   LABS: Basic Metabolic Panel:  Basename 02/14/12 0530 02/13/12 0550  NA 137 130*  K 3.0* 3.4*  CL 100 94*  CO2 26 23  GLUCOSE 158* 296*  BUN 4* 6  CREATININE 0.47* 0.50  CALCIUM 8.6 9.1  MG -- --  PHOS -- --   Liver Function Tests: No results found for this basename: AST:2,ALT:2,ALKPHOS:2,BILITOT:2,PROT:2,ALBUMIN:2 in the last 72 hours No results found for this basename: LIPASE:2,AMYLASE:2 in the last 72 hours CBC:  Basename 02/14/12 0530 02/13/12 0550  WBC 9.4 10.5  NEUTROABS -- --  HGB 11.0* 12.4  HCT 33.0* 36.2  MCV 80.1 79.7  PLT 362 357   Cardiac Enzymes: No results found for this basename: CKTOTAL:3,CKMB:3,CKMBINDEX:3,TROPONINI:3 in the last 72 hours BNP: No components found with this basename: POCBNP:3 D-Dimer: No results found for this basename: DDIMER:2 in the last 72 hours Hemoglobin A1C: No results found for this  basename: HGBA1C in the last 72 hours Fasting Lipid Panel: No results found for this basename: CHOL,HDL,LDLCALC,TRIG,CHOLHDL,LDLDIRECT in the last 72 hours Thyroid Function Tests: No results found for this basename: TSH,T4TOTAL,FREET3,T3FREE,THYROIDAB in the last 72 hours Anemia Panel: No results found for this basename: VITAMINB12,FOLATE,FERRITIN,TIBC,IRON,RETICCTPCT in the last 72 hours Coag Panel:   Lab Results  Component Value Date   INR 1.06 02/12/2012    RADIOLOGY: Dg Chest Portable 1 View  02/08/2012  *RADIOLOGY REPORT*  Clinical Data: Flu like symptoms.  Chest pain and shortness of breath.  PORTABLE CHEST - 1 VIEW  Comparison: None.  Findings: The heart size is normal.  The lung volumes are low. This exaggerates the pulmonary interstitium.  No focal airspace disease is evident.  The visualized soft tissues and bony thorax are unremarkable.  IMPRESSION: No acute cardiopulmonary disease.   Original Report Authenticated By: Marin Roberts, M.D.       ASSESSMENT: Status post non-ST elevation MI in the setting of diabetic ketoacidosis.  PLAN:  She will need dual antiplatelet therapy for at least a year.  Could use either Effient 10 mg daily or Brilinta 90 mg BID.  Would make sure that she has a coupon for free 30 day supply from case manager.  Okay to stay on aspirin  81 mg daily.  I stressed the importance of secondary prevention, particularly diabetes control.  LDL target 70.  Continue atorvastatin.   She will followup with Dr. Anne Fu.  Corky Crafts., MD  02/14/2012  9:23 AM

## 2012-02-14 NOTE — Discharge Summary (Signed)
Physician Discharge Summary  Vickie Little:[id number] DOB: 09/06/1965 DOA: 02/08/2012  PCP: Tomma Lightning, MD  Admit date: 02/08/2012 Discharge date: 02/14/2012  Time spent: 60  minutes  Recommendations for Outpatient Follow-up:  1. Follow up with cardiology 2-3 weeks (include homehealth, outpatient follow-up instructions, specific recommendations for PCP to follow-up on, etc.)  Discharge Diagnoses:  Active Problems:  NSTEMI (non-ST elevated myocardial infarction)  Hypertriglyceridemia  UTI (lower urinary tract infection)  Flu-like symptoms  Diabetes mellitus type II, uncontrolled  Bacteremia due to Gram-negative bacteria  Sepsis   Discharge Condition: stable  Diet recommendation: heart healthy diet  Filed Weights   02/09/12 0511 02/14/12 0657  Weight: 75.8 kg (167 lb 1.7 oz) 78.472 kg (173 lb)    History of present illness:  46 year old African American female with history of diabetes, tobacco use presents to the ER today with chief complaint of weakness for over 2 weeks, she reports intermittent chest pressure at rest and on exertion along with dyspnea on exertion for over 2 weeks.  Denies any chest pain at this time, Upon evaluation in ER noted to have DKA with elevated troponin and EKG changes.   Hospital Course:  NSTEMI (non-ST elevated myocardial infarction) (02/08/2012) - cardiac cath: PCI to the mid left anterior descending artery with overlapping drug eluting stents 12.23.2013.  - 12.23.2013 ECHO with: preserved EF no wall motion.  - ASA, statins and Brilliant for at least a year  Hypertriglyceridemia (02/09/2012) - statins.  - follow up with PCP.   DKA/Diabetes mellitus type II, uncontrolled (02/10/2012)  - Resolved.  - CBGs improved.  - Will increase lantus to 35.  - resume metformin as an outpatient.   Sepsis/UTI/Bacteremia due to Gram-negative bacteria (02/10/2012) -Afebrile overnight.  -Has received 5 days of IV antibiotics. Change to Cipro  Oral for 14 days.  - BP stable, no pressor required.   Procedures:  PCI 12.23.2013  Consultations:  [facility] cardiology  Discharge Exam: Filed Vitals:   02/13/12 1719 02/13/12 2057 02/13/12 2338 02/14/12 0657  BP: 118/77 107/69  104/68  Pulse: 86 94  3  Temp: 100.1 F (37.8 C) 101.5 F (38.6 C) 99.1 F (37.3 C) 98.1 F (36.7 C)  TempSrc:  Oral  Oral  Resp: 20 18    Height:      Weight:    78.472 kg (173 lb)  SpO2: 100% 97%  99%    General: A & O x3   Cardiovascular: RRR Respiratory: good air movement CTA B/L  Discharge Instructions  Discharge Orders    Future Orders Please Complete By Expires   Ambulatory referral to Nutrition and Diabetic Education      Scheduling Instructions:   A1C=13.0%.  Patient is new to insulin.  Please call in January to schedule appointment.   Amb Referral to Cardiac Rehabilitation      Diet - low sodium heart healthy      Increase activity slowly          Medication List     As of 02/14/2012  9:03 AM    STOP taking these medications         PENICILLIN V POTASSIUM PO      TAKE these medications         aspirin 81 MG chewable tablet   Chew 4 tablets (324 mg total) by mouth daily.      atorvastatin 80 MG tablet   Commonly known as: LIPITOR   Take 1 tablet (80 mg total) by mouth daily at  6 PM.      Biotin 5000 MCG Caps   Take 5,000 mcg by mouth daily.      ciprofloxacin 500 MG tablet   Commonly known as: CIPRO   Take 1 tablet (500 mg total) by mouth 2 (two) times daily.      insulin glargine 100 UNIT/ML injection   Commonly known as: LANTUS   Inject 40 Units into the skin at bedtime.      metFORMIN 500 MG tablet   Commonly known as: GLUCOPHAGE   Take 500 mg by mouth 2 (two) times daily with a meal.      metoprolol tartrate 25 MG tablet   Commonly known as: LOPRESSOR   Take 1 tablet (25 mg total) by mouth 2 (two) times daily.      OVER THE COUNTER MEDICATION   Take 1 packet by mouth as needed. For cold  -  Theraflu  packets      Ticagrelor 90 MG Tabs tablet   Commonly known as: BRILINTA   Take 1 tablet (90 mg total) by mouth 2 (two) times daily.           Follow-up Information    Follow up with KATES, CHARITY, MD. In 2 weeks. (hospital follow up)    Contact information:   [address], [address] [facility] [place] [address] [phone]       Follow up with Donato Schultz, MD. In 3 weeks. (hospital follow up)    Contact information:   [address] [place] [address] [phone]           The results of significant diagnostics from this hospitalization (including imaging, microbiology, ancillary and laboratory) are listed below for reference.    Significant Diagnostic Studies: Dg Chest Portable 1 View  02/08/2012  *RADIOLOGY REPORT*  Clinical Data: Flu like symptoms.  Chest pain and shortness of breath.  PORTABLE CHEST - 1 VIEW  Comparison: None.  Findings: The heart size is normal.  The lung volumes are low. This exaggerates the pulmonary interstitium.  No focal airspace disease is evident.  The visualized soft tissues and bony thorax are unremarkable.  IMPRESSION: No acute cardiopulmonary disease.   Original Report Authenticated By: Marin Roberts, M.D.     Microbiology: Recent Results (from the past 240 hour(s))  URINE CULTURE     Status: Normal   Collection Time   02/08/12 12:51 PM      Component Value Range Status Comment   Specimen Description URINE, CLEAN CATCH   Final    Special Requests NONE   Final    Culture  Setup Time 02/08/2012 15:18   Final    Colony Count 20,OOO COLONIES/ML   Final    Culture     Final    Value: Multiple bacterial morphotypes present, none predominant. Suggest appropriate recollection if clinically indicated.   Report Status 02/09/2012 FINAL   Final   MRSA PCR SCREENING     Status: Normal   Collection Time   02/08/12  7:25 PM      Component Value Range Status Comment   MRSA by PCR NEGATIVE  NEGATIVE  Final   CULTURE, BLOOD (ROUTINE X 2)     Status: Normal   Collection Time   02/09/12  9:00 AM      Component Value Range Status Comment   Specimen Description BLOOD ARM RIGHT   Final    Special Requests BOTTLES DRAWN AEROBIC AND ANAEROBIC 10CC   Final  Culture  Setup Time 02/09/2012 14:00   Final    Culture     Final    Value: ESCHERICHIA COLI     Note: Gram Stain Report Called to,Read Back By and Verified With: JUDITH K@1236  ON 122013 BY [facility]   Report Status 02/12/2012 FINAL   Final    Organism ID, Bacteria ESCHERICHIA COLI   Final   CULTURE, BLOOD (ROUTINE X 2)     Status: Normal   Collection Time   02/09/12  9:10 AM      Component Value Range Status Comment   Specimen Description BLOOD HAND LEFT   Final    Special Requests BOTTLES DRAWN AEROBIC ONLY 10CC   Final    Culture  Setup Time 02/09/2012 14:00   Final    Culture     Final    Value: ESCHERICHIA COLI     Note: Gram Stain Report Called to,Read Back By and Verified With: Melton Alar 02/11/12 @ 9:35PM BY RUSCA.   Report Status 02/13/2012 FINAL   Final    Organism ID, Bacteria ESCHERICHIA COLI   Final   URINE CULTURE     Status: Normal   Collection Time   02/11/12 10:01 PM      Component Value Range Status Comment   Specimen Description URINE, CLEAN CATCH   Final    Special Requests PATIENT ON FOLLOWING ROCEPHIN   Final    Culture  Setup Time 02/12/2012 06:15   Final    Colony Count 3,000 COLONIES/ML   Final    Culture INSIGNIFICANT GROWTH   Final    Report Status 02/13/2012 FINAL   Final   MRSA PCR SCREENING     Status: Normal   Collection Time   02/13/12  9:33 AM      Component Value Range Status Comment   MRSA by PCR NEGATIVE  NEGATIVE Final      Labs: Basic Metabolic Panel:  Lab 02/14/12 1610 02/13/12 0550 02/12/12 1556 02/10/12 0750 02/09/12 0303  NA 137 130* 134* 134* 131*131*  K 3.0* 3.4* 3.7 3.4* 3.83.7  CL 100 94* 97 99 101101  CO2 26 23 23 19  1918*  GLUCOSE 158* 296* 306* 296* 135*134*  BUN  4* 6 9 8  1211  CREATININE 0.47* 0.50 0.51 0.49* 0.510.49*  CALCIUM 8.6 9.1 9.2 8.8 9.29.2  MG -- -- -- -- --  PHOS -- -- -- -- --   Liver Function Tests:  Lab 02/09/12 0303 02/08/12 1251  AST 27 45*  ALT 17 23  ALKPHOS 240* 309*  BILITOT 0.4 0.4  PROT 6.9 8.8*  ALBUMIN 2.9* 3.7    Lab 02/08/12 1516  LIPASE 25  AMYLASE --   No results found for this basename: AMMONIA:5 in the last 168 hours CBC:  Lab 02/14/12 0530 02/13/12 0550 02/12/12 1556 02/11/12 0545 02/09/12 0303 02/08/12 1251  WBC 9.4 10.5 8.4 7.4 9.8 --  NEUTROABS -- -- -- -- -- 10.5*  HGB 11.0* 12.4 11.8* 10.9* 12.7 --  HCT 33.0* 36.2 34.7* 32.6* 36.1 --  MCV 80.1 79.7 79.4 78.7 78.8 --  PLT 362 357 324 236 207 --   Cardiac Enzymes:  Lab 02/09/12 0908 02/09/12 0303 02/08/12 2101 02/08/12 1653 02/08/12 1518  CKTOTAL 158 191* 260* -- --  CKMB 5.6* 7.4* 12.1* -- --  CKMBINDEX -- -- -- -- --  TROPONINI 4.54* 7.09* 7.20* 8.46* 7.56*   BNP: BNP (last 3 results) No results found for this basename: PROBNP:3 in the last 8760  hours CBG:  Lab 02/14/12 0748 02/13/12 2059 02/13/12 1638 02/13/12 1158 02/13/12 0918  GLUCAP 172* 187* 107* 210* 236*       Signed:  Marinda Elk  [facility] 02/14/2012, 9:03 AM

## 2012-02-14 NOTE — Progress Notes (Signed)
CARDIAC REHAB PHASE I   PRE:  Rate/Rhythm: 97 SR  BP:  Supine:   Sitting: 101/67  Standing:    SaO2: 98 RA  MODE:  Ambulation: 700 ft   POST:  Rate/Rhythem: 104  BP:  Supine:   Sitting: 112/73  Standing:    SaO2: 100 RA 0745-0900 Pt tolerated ambulation well without c/o of cp or SOB. VS stable Completed MI and stent education with pt. She voices understanding. Pt agrees to Outpt. CRP in [place], will send referral.  Beatrix Fetters

## 2012-02-16 MED FILL — Dextrose Inj 5%: INTRAVENOUS | Qty: 50 | Status: AC

## 2012-03-14 ENCOUNTER — Encounter: Payer: Self-pay | Admitting: Dietician

## 2013-02-06 ENCOUNTER — Ambulatory Visit (INDEPENDENT_AMBULATORY_CARE_PROVIDER_SITE_OTHER): Payer: BC Managed Care – PPO | Admitting: Family Medicine

## 2013-02-06 VITALS — BP 134/86 | HR 81 | Temp 98.3°F | Resp 16 | Ht 65.5 in | Wt 189.4 lb

## 2013-02-06 DIAGNOSIS — J029 Acute pharyngitis, unspecified: Secondary | ICD-10-CM

## 2013-02-06 DIAGNOSIS — H9209 Otalgia, unspecified ear: Secondary | ICD-10-CM

## 2013-02-06 DIAGNOSIS — H9202 Otalgia, left ear: Secondary | ICD-10-CM

## 2013-02-06 LAB — POCT RAPID STREP A (OFFICE): Rapid Strep A Screen: NEGATIVE

## 2013-02-06 MED ORDER — AMOXICILLIN 875 MG PO TABS: 875.0000 mg | ORAL_TABLET | Freq: Two times a day (BID) | ORAL | Status: DC

## 2013-02-06 NOTE — Progress Notes (Addendum)
[facility] [address], [place] [address] [phone]- 0000  Date:  02/06/2013   Name:  Vickie Little   DOB:  November 08, 1965   MRN:  [id number]  PCP:  Tomma Lightning, MD    Chief Complaint: Sore Throat and left earache x 1 week   History of Present Illness:  Vickie Little is a 47 y.o. very pleasant female patient who presents with the following:  She is here with left earache and ST for about one week. No fever, dry cough.   No drainage from the ear.  She has noted some fullness in her left sided nodes as well.   Hearing is ok, no tinnitus.   No nasal sx.    This time last year she suffered sepsis and an NSTEMI. She has recovered well from this ilness.   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 Sepsis 02/13/2012  . Diabetes mellitus type II, uncontrolled 02/10/2012  . Bacteremia due to Gram-negative bacteria 02/10/2012  . Hypertriglyceridemia 02/09/2012  . UTI (lower urinary tract infection) 02/09/2012  . Flu-like symptoms 02/09/2012  . NSTEMI (non-ST elevated myocardial infarction) 02/08/2012    Past Medical History  Diagnosis Date  . Diabetes mellitus without complication   . Heart attack     History reviewed. No pertinent past surgical history.  History  Substance Use Topics  . Smoking status: Current Every Day Smoker -- 1.00 packs/day for 20 years  . Smokeless tobacco: Not on file  . Alcohol Use: Yes     Comment: socially. 2-3 beers on weekend.    History reviewed. No pertinent family history.  No Known Allergies  Medication list has been reviewed and updated.  Current Outpatient Prescriptions on File Prior to Visit  Medication Sig Dispense Refill  . aspirin 81 MG chewable tablet Chew 4 tablets (324 mg total) by mouth daily.  30 tablet  2  . Biotin 5000 MCG CAPS Take 5,000 mcg by mouth daily.      . insulin glargine (LANTUS)  100 UNIT/ML injection Inject 40 Units into the skin at bedtime.  10 mL  2  . metFORMIN (GLUCOPHAGE) 500 MG tablet Take 500 mg by mouth 2 (two) times daily with a meal.      . atorvastatin (LIPITOR) 80 MG tablet Take 1 tablet (80 mg total) by mouth daily at 6 PM.  30 tablet  2  . metoprolol tartrate (LOPRESSOR) 25 MG tablet Take 1 tablet (25 mg total) by mouth 2 (two) times daily.  60 tablet  0  . OVER THE COUNTER MEDICATION Take 1 packet by mouth as needed. For cold  -  Theraflu packets      . Ticagrelor (BRILINTA) 90 MG TABS tablet Take 1 tablet (90 mg total) by mouth 2 (two) times daily.  90 tablet  0   No current facility-administered medications on file prior to visit.    Review of Systems:  As per HPI- otherwise negative.   Physical Examination: Filed Vitals:   02/06/13 1036  BP: 134/86  Pulse: 81  Temp: 98.3 F (36.8 C)  Resp: 16   Filed Vitals:   02/06/13 1036  Height: 5' 5.5" (1.664 m)  Weight: 189 lb 6.4 oz (85.911 kg)   Body mass index is 31.03 kg/(m^2). Ideal Body Weight: Weight in (lb) to have BMI =  25: 152.2  GEN: WDWN, NAD, Non-toxic, A & O x 3, obese HEENT: Atraumatic, Normocephalic. Neck supple. No masses, No LAD.  Bilateral TM wnl, oropharynx inflmaed but no exudate.  Tender LAD left anterior cervical nodes.  PEERL,EOMI.   Ears and Nose: No external deformity. CV: RRR, No M/G/R. No JVD. No thrill. No extra heart sounds. PULM: CTA B, no wheezes, crackles, rhonchi. No retractions. No resp. distress. No accessory muscle use. ABD: S, NT, ND EXTR: No c/c/e NEURO Normal gait.  PSYCH: Normally interactive. Conversant. Not depressed or anxious appearing.  Calm demeanor.   Results for orders placed in visit on 02/06/13  POCT RAPID STREP A (OFFICE)      Result Value Range   Rapid Strep A Screen Negative  Negative   Assessment and Plan: Ear ache, left - Plan: Culture, Group A Strep, amoxicillin (AMOXIL) 875 MG tablet  Acute pharyngitis - Plan: POCT rapid strep  A, Culture, Group A Strep, amoxicillin (AMOXIL) 875 MG tablet  Suspect bacterial pharyngitis.  Treat with amoxicillin.  Asked her to let me know if not better in the next couple of days- Sooner if worse.   Signed Abbe Amsterdam, MD  12/21:  Called with throat culture results.  She is feeling much better, although culture results negative.  She will finish out abs

## 2013-02-06 NOTE — Patient Instructions (Signed)
Me know if you are not better in the next couple of days- Sooner if worse.

## 2013-06-08 ENCOUNTER — Encounter (HOSPITAL_COMMUNITY): Payer: Self-pay | Admitting: Emergency Medicine

## 2013-06-08 ENCOUNTER — Emergency Department (HOSPITAL_COMMUNITY): Payer: BC Managed Care – PPO

## 2013-06-08 ENCOUNTER — Other Ambulatory Visit: Payer: Self-pay | Admitting: Family Medicine

## 2013-06-08 ENCOUNTER — Emergency Department (HOSPITAL_COMMUNITY)
Admission: EM | Admit: 2013-06-08 | Discharge: 2013-06-08 | Disposition: A | Discharge status: Home / Self Care | Payer: BC Managed Care – PPO | Attending: Emergency Medicine | Admitting: Emergency Medicine

## 2013-06-08 ENCOUNTER — Ambulatory Visit (INDEPENDENT_AMBULATORY_CARE_PROVIDER_SITE_OTHER): Payer: BC Managed Care – PPO | Admitting: Family Medicine

## 2013-06-08 ENCOUNTER — Ambulatory Visit: Payer: BC Managed Care – PPO

## 2013-06-08 VITALS — BP 118/74 | HR 98 | Temp 98.3°F | Resp 18 | Ht 67.0 in | Wt 186.0 lb

## 2013-06-08 DIAGNOSIS — K56609 Unspecified intestinal obstruction, unspecified as to partial versus complete obstruction: Secondary | ICD-10-CM

## 2013-06-08 DIAGNOSIS — K859 Acute pancreatitis without necrosis or infection, unspecified: Secondary | ICD-10-CM

## 2013-06-08 DIAGNOSIS — I252 Old myocardial infarction: Secondary | ICD-10-CM | POA: Insufficient documentation

## 2013-06-08 DIAGNOSIS — E119 Type 2 diabetes mellitus without complications: Secondary | ICD-10-CM | POA: Insufficient documentation

## 2013-06-08 DIAGNOSIS — R109 Unspecified abdominal pain: Secondary | ICD-10-CM

## 2013-06-08 DIAGNOSIS — R748 Abnormal levels of other serum enzymes: Secondary | ICD-10-CM | POA: Insufficient documentation

## 2013-06-08 DIAGNOSIS — K3189 Other diseases of stomach and duodenum: Secondary | ICD-10-CM

## 2013-06-08 DIAGNOSIS — Z794 Long term (current) use of insulin: Secondary | ICD-10-CM | POA: Insufficient documentation

## 2013-06-08 DIAGNOSIS — R1013 Epigastric pain: Secondary | ICD-10-CM

## 2013-06-08 DIAGNOSIS — Z79899 Other long term (current) drug therapy: Secondary | ICD-10-CM | POA: Insufficient documentation

## 2013-06-08 DIAGNOSIS — Z7982 Long term (current) use of aspirin: Secondary | ICD-10-CM | POA: Insufficient documentation

## 2013-06-08 DIAGNOSIS — F17200 Nicotine dependence, unspecified, uncomplicated: Secondary | ICD-10-CM | POA: Insufficient documentation

## 2013-06-08 LAB — I-STAT CHEM 8, ED
BUN: 10 mg/dL (ref 6–23)
CHLORIDE: 100 meq/L (ref 96–112)
Calcium, Ion: 1.19 mmol/L (ref 1.12–1.23)
Creatinine, Ser: 0.6 mg/dL (ref 0.50–1.10)
GLUCOSE: 319 mg/dL — AB (ref 70–99)
HEMATOCRIT: 46 % (ref 36.0–46.0)
HEMOGLOBIN: 15.6 g/dL — AB (ref 12.0–15.0)
POTASSIUM: 4.9 meq/L (ref 3.7–5.3)
SODIUM: 133 meq/L — AB (ref 137–147)
TCO2: 22 mmol/L (ref 0–100)

## 2013-06-08 LAB — POCT CBC
Granulocyte percent: 63.3 %G (ref 37–80)
HEMATOCRIT: 40.3 % (ref 37.7–47.9)
HEMOGLOBIN: 13.1 g/dL (ref 12.2–16.2)
LYMPH, POC: 2.1 (ref 0.6–3.4)
MCH: 27.1 pg (ref 27–31.2)
MCHC: 32.5 g/dL (ref 31.8–35.4)
MCV: 83.4 fL (ref 80–97)
MID (cbc): 0.6 (ref 0–0.9)
MPV: 10.2 fL (ref 0–99.8)
POC GRANULOCYTE: 4.7 (ref 2–6.9)
POC LYMPH %: 29 % (ref 10–50)
POC MID %: 7.7 %M (ref 0–12)
Platelet Count, POC: 313 10*3/uL (ref 142–424)
RBC: 4.83 M/uL (ref 4.04–5.48)
RDW, POC: 13.7 %
WBC: 7.4 10*3/uL (ref 4.6–10.2)

## 2013-06-08 LAB — POCT URINALYSIS DIPSTICK
BILIRUBIN UA: NEGATIVE
GLUCOSE UA: 500
Ketones, UA: 15
LEUKOCYTES UA: NEGATIVE
NITRITE UA: NEGATIVE
PH UA: 5.5
Spec Grav, UA: >=1.03
UROBILINOGEN UA: 0.2

## 2013-06-08 LAB — LIPASE, BLOOD: Lipase: 152 U/L — ABNORMAL HIGH (ref 11–59)

## 2013-06-08 LAB — COMPREHENSIVE METABOLIC PANEL
ALBUMIN: 4.4 g/dL (ref 3.5–5.2)
ALT: 14 U/L (ref 0–35)
AST: 11 U/L (ref 0–37)
Alkaline Phosphatase: 345 U/L — ABNORMAL HIGH (ref 39–117)
BILIRUBIN TOTAL: 0.4 mg/dL (ref 0.3–1.2)
BUN: 11 mg/dL (ref 6–23)
CHLORIDE: 95 meq/L — AB (ref 96–112)
CO2: 23 meq/L (ref 19–32)
Calcium: 10.6 mg/dL — ABNORMAL HIGH (ref 8.4–10.5)
Creatinine, Ser: 0.56 mg/dL (ref 0.50–1.10)
GFR calc Af Amer: >90 mL/min (ref >90)
Glucose, Bld: 311 mg/dL — ABNORMAL HIGH (ref 70–99)
POTASSIUM: 5.1 meq/L (ref 3.7–5.3)
SODIUM: 131 meq/L — AB (ref 137–147)
Total Protein: 8.2 g/dL (ref 6.0–8.3)

## 2013-06-08 LAB — POCT URINE PREGNANCY: PREG TEST UR: NEGATIVE

## 2013-06-08 LAB — POCT UA - MICROSCOPIC ONLY
CASTS, UR, LPF, POC: NEGATIVE
Crystals, Ur, HPF, POC: NEGATIVE
Yeast, UA: NEGATIVE

## 2013-06-08 LAB — GLUCOSE, POCT (MANUAL RESULT ENTRY): POC GLUCOSE: 360 mg/dL — AB (ref 70–99)

## 2013-06-08 LAB — CBG MONITORING, ED: GLUCOSE-CAPILLARY: 228 mg/dL — AB (ref 70–99)

## 2013-06-08 MED ORDER — GI COCKTAIL ~~LOC~~: 30.0000 mL | Freq: Once | ORAL | Status: DC

## 2013-06-08 MED ORDER — SODIUM CHLORIDE 0.9 % IV BOLUS (SEPSIS)
1000.0000 mL | Freq: Once | INTRAVENOUS | Status: AC
  Administered 2013-06-08: 1000 mL via INTRAVENOUS

## 2013-06-08 MED ORDER — OXYCODONE-ACETAMINOPHEN 5-325 MG PO TABS: 2.0000 | ORAL_TABLET | ORAL | Status: DC | PRN

## 2013-06-08 MED ORDER — ONDANSETRON HCL 4 MG/2ML IJ SOLN
4.0000 mg | Freq: Once | INTRAMUSCULAR | Status: AC
  Administered 2013-06-08: 4 mg via INTRAVENOUS
  Filled 2013-06-08: qty 2

## 2013-06-08 MED ORDER — ESOMEPRAZOLE MAGNESIUM 40 MG PO CPDR: 40.0000 mg | DELAYED_RELEASE_CAPSULE | Freq: Every day | ORAL | Status: DC

## 2013-06-08 MED ORDER — ONDANSETRON 8 MG PO TBDP: ORAL_TABLET | ORAL | Status: DC

## 2013-06-08 MED ORDER — MORPHINE SULFATE 4 MG/ML IJ SOLN
4.0000 mg | Freq: Once | INTRAMUSCULAR | Status: AC
  Administered 2013-06-08: 4 mg via INTRAVENOUS
  Filled 2013-06-08: qty 1

## 2013-06-08 NOTE — ED Notes (Addendum)
Pt states she has had indigestion and epigastric pain since last Saturday. Pt went to urgent care, who referred her to the ED to further evaluate abdominal pain. Pt states blood sugar was 360 at urgent care. Pt denies emesis/diarrhea. Pt states she has intermittent nausea. Pt has hx of MI and DM type 2.

## 2013-06-08 NOTE — Progress Notes (Signed)
[facility] [address], [place] [address] [phone]- 0000  Date:  06/08/2013   Name:  Vickie Little   DOB:  16-Oct-1965   MRN:  [id number]  PCP:  Tomma Lightning, MD    Chief Complaint: Abdominal Pain and Constipation   History of Present Illness:  Vickie Little is a 48 y.o. very pleasant female patient who presents with the following:  Here today with abdominal concern.  She does have a histoyr of NSTEMI in 2013, DM. She suffered sepsis in 2013 as well.  Last seen by myself in December with an earache.   She is here today with "these stomach pains."  She notes epigastric pains for about one week.  She has tried OTC antacids- alka seltzer does seem to help, but mylanta does not.   Last night her pain was "the worst it ever had been."  Food seems to make it worse.  She drank some milk and it helped last night She has been told she had possible gallbladder issues in the past.  She has not yet had an ultrasound.   She has not had any nausea or vomiting.  She has been constipated- she is using dulcolax and using [place].  She last had a small BM this am.  She has not had a "normal" BM in "a while"  She has never had this stomach trouble in the past.  She does have any CP or SOB.  The abdominal pain is non- exertional.    She has been exercising as usual- she has not had any CP with activity.   She has not noted a fever.  No antiacids yet this am.   She has not noted any urinary sx LMP was last week.    She did drink water this am around 0300.  Otherwise no PO so far today  Patient Active Problem List   Diagnosis Date Noted  . Sepsis 02/13/2012  . Diabetes mellitus type II, uncontrolled 02/10/2012  . Bacteremia due to Gram-negative bacteria 02/10/2012  . Hypertriglyceridemia 02/09/2012  . UTI (lower urinary tract infection) 02/09/2012  . Flu-like symptoms 02/09/2012  . NSTEMI (non-ST elevated myocardial infarction) 02/08/2012    Past Medical  History  Diagnosis Date  . Diabetes mellitus without complication   . Heart attack     History reviewed. No pertinent past surgical history.  History  Substance Use Topics  . Smoking status: Current Every Day Smoker -- 1.00 packs/day for 20 years  . Smokeless tobacco: Not on file  . Alcohol Use: Yes     Comment: socially. 2-3 beers on weekend.    History reviewed. No pertinent family history.  No Known Allergies  Medication list has been reviewed and updated.  Current Outpatient Prescriptions on File Prior to Visit  Medication Sig Dispense Refill  . aspirin 81 MG chewable tablet Chew 4 tablets (324 mg total) by mouth daily.  30 tablet  2  . Biotin 5000 MCG CAPS Take 5,000 mcg by mouth daily.      . insulin glargine (LANTUS) 100 UNIT/ML injection Inject 40 Units into the skin at bedtime.  10 mL  2  . metFORMIN (GLUCOPHAGE) 500 MG tablet Take 500 mg by mouth 2 (two) times daily with a meal.      . OVER THE COUNTER MEDICATION Take 1 packet by mouth as needed. For cold  -  Theraflu packets      . amoxicillin (AMOXIL) 875 MG tablet Take 1  tablet (875 mg total) by mouth 2 (two) times daily.  20 tablet  0   No current facility-administered medications on file prior to visit.    Review of Systems:  As per HPI- otherwise negative.   Physical Examination: Filed Vitals:   06/08/13 0908  BP: 118/74  Pulse: 98  Temp: 98.3 F (36.8 C)  Resp: 18   Filed Vitals:   06/08/13 0908  Height: 5\' 7"  (1.702 m)  Weight: 186 lb (84.369 kg)   Body mass index is 29.12 kg/(m^2). Ideal Body Weight: Weight in (lb) to have BMI = 25: 159.3  GEN: WDWN, NAD, Non-toxic, A & O x 3, obese, looks well HEENT: Atraumatic, Normocephalic. Neck supple. No masses, No LAD.  Bilateral TM wnl, oropharynx normal.  PEERL,EOMI.   Ears and Nose: No external deformity. CV: RRR, No M/G/R. No JVD. No thrill. No extra heart sounds. PULM: CTA B, no wheezes, crackles, rhonchi. No retractions. No resp. distress. No  accessory muscle use. ABD: S, ND, +BS. No rebound. No HSM.  EXTR: No c/c/e NEURO Normal gait.  PSYCH: Normally interactive. Conversant. Not depressed or anxious appearing.  Calm demeanor.  Epigastric tenderness , RUQ tenderness.  Negative murphys sign  EKG: somewhat low voltage but no ST elevation or depression to suggest ischemia  GI cocktail:helped somewhat but did not resolve her pain  Results for orders placed in visit on 06/08/13  POCT URINALYSIS DIPSTICK      Result Value Ref Range   Color, UA yellow     Clarity, UA hazy     Glucose, UA 500     Bilirubin, UA neg     Ketones, UA 15     Spec Grav, UA >=1.030     Blood, UA small     pH, UA 5.5     Protein, UA 2+     Urobilinogen, UA 0.2     Nitrite, UA neg     Leukocytes, UA Negative    POCT UA - MICROSCOPIC ONLY      Result Value Ref Range   WBC, Ur, HPF, POC 2-3     RBC, urine, microscopic 3-5     Bacteria, U Microscopic 1+     Mucus, UA 1+     Epithelial cells, urine per micros 3-6     Crystals, Ur, HPF, POC neg     Casts, Ur, LPF, POC neg     Yeast, UA neg    POCT CBC      Result Value Ref Range   WBC 7.4  4.6 - 10.2 K/uL   Lymph, poc 2.1  0.6 - 3.4   POC LYMPH PERCENT 29.0  10 - 50 %L   MID (cbc) 0.6  0 - 0.9   POC MID % 7.7  0 - 12 %M   POC Granulocyte 4.7  2 - 6.9   Granulocyte percent 63.3  37 - 80 %G   RBC 4.83  4.04 - 5.48 M/uL   Hemoglobin 13.1  12.2 - 16.2 g/dL   HCT, POC 98.140.3  19.137.7 - 47.9 %   MCV 83.4  80 - 97 fL   MCH, POC 27.1  27 - 31.2 pg   MCHC 32.5  31.8 - 35.4 g/dL   RDW, POC 47.813.7     Platelet Count, POC 313  142 - 424 K/uL   MPV 10.2  0 - 99.8 fL  POCT URINE PREGNANCY      Result Value Ref Range   Preg  Test, Ur Negative    GLUCOSE, POCT (MANUAL RESULT ENTRY)      Result Value Ref Range   POC Glucose 360 (*) 70 - 99 mg/dl   [facility] reading (PRIMARY) by  Dr. Patsy Lageropland. abd series: consistent with ileus or SBO.  She does also have increased stool throughout colon.    Will cancel amylase,  lipase, CMP as she is going to ED   Assessment and Plan: Stomach pain - Plan: POCT urinalysis dipstick, POCT UA - Microscopic Only, POCT CBC, Comprehensive metabolic panel, Amylase, Lipase, POCT urine pregnancy, Urine culture, EKG 12-Lead, CANCELED: DG Abd 2 Views  Abdominal pain, epigastric - Plan: gi cocktail (Maalox,Lidocaine,Donnatal), POCT CBC, Comprehensive metabolic panel, Amylase, Lipase, POCT urine pregnancy, CANCELED: DG Abd 2 Views, CANCELED: US Abdomen Limited RUQ  Type II or unspecified type diabetes mellitus without mention of complication, not stated as uncontrolled - Plan: POCT glucose (manual entry)  Small bowel obstruction  Appears to have an SBO on x-rays here today.  Also, her glucose is quite out of control for her.  Referral to [facility] for further evaluation.  May need admission depending on further results.   Signed Abbe AmsterdamJessica Copland, MD

## 2013-06-08 NOTE — Patient Instructions (Signed)
Please proceed straight to the [facility] ER.  They will check you out and try to find out what is causing your abdominal pain.

## 2013-06-08 NOTE — Discharge Instructions (Signed)
Acute Pancreatitis °Acute pancreatitis is a disease in which the pancreas becomes suddenly inflamed. The pancreas is a large gland located behind your stomach. The pancreas produces enzymes that help digest food. The pancreas also releases the hormones glucagon and insulin that help regulate blood sugar. Damage to the pancreas occurs when the digestive enzymes from the pancreas are activated and begin attacking the pancreas before being released into the intestine. Most acute attacks last a couple of days and can cause serious complications. Some people become dehydrated and develop low blood pressure. In severe cases, bleeding into the pancreas can lead to shock and can be life-threatening. The lungs, heart, and kidneys may fail. °CAUSES  °Pancreatitis can happen to anyone. In some cases, the cause is unknown. Most cases are caused by: °· Alcohol abuse. °· Gallstones. °Other less common causes are: °· Certain medicines. °· Exposure to certain chemicals. °· Infection. °· Damage caused by an accident (trauma). °· Abdominal surgery. °SYMPTOMS  °· Pain in the upper abdomen that may radiate to the back. °· Tenderness and swelling of the abdomen. °· Nausea and vomiting. °DIAGNOSIS  °Your caregiver will perform a physical exam. Blood and stool tests may be done to confirm the diagnosis. Imaging tests may also be done, such as X-rays, CT scans, or an ultrasound of the abdomen. °TREATMENT  °Treatment usually requires a stay in the hospital. Treatment may include: °· Pain medicine. °· Fluid replacement through an intravenous line (IV). °· Placing a tube in the stomach to remove stomach contents and control vomiting. °· Not eating for 3 or 4 days. This gives your pancreas a rest, because enzymes are not being produced that can cause further damage. °· Antibiotic medicines if your condition is caused by an infection. °· Surgery of the pancreas or gallbladder. °HOME CARE INSTRUCTIONS  °· Follow the diet advised by your  caregiver. This may involve avoiding alcohol and decreasing the amount of fat in your diet. °· Eat smaller, more frequent meals. This reduces the amount of digestive juices the pancreas produces. °· Drink enough fluids to keep your urine clear or pale yellow. °· Only take over-the-counter or prescription medicines as directed by your caregiver. °· Avoid drinking alcohol if it caused your condition. °· Do not smoke. °· Get plenty of rest. °· Check your blood sugar at home as directed by your caregiver. °· Keep all follow-up appointments as directed by your caregiver. °SEEK MEDICAL CARE IF:  °· You do not recover as quickly as expected. °· You develop new or worsening symptoms. °· You have persistent pain, weakness, or nausea. °· You recover and then have another episode of pain. °SEEK IMMEDIATE MEDICAL CARE IF:  °· You are unable to eat or keep fluids down. °· Your pain becomes severe. °· You have a fever or persistent symptoms for more than 2 to 3 days. °· You have a fever and your symptoms suddenly get worse. °· Your skin or the white part of your eyes turn yellow (jaundice). °· You develop vomiting. °· You feel dizzy, or you faint. °· Your blood sugar is high (over 300 mg/dL). °MAKE SURE YOU:  °· Understand these instructions. °· Will watch your condition. °· Will get help right away if you are not doing well or get worse. °Document Released: 02/07/2005 Document Revised: 08/09/2011 Document Reviewed: 05/19/2011 °[employer]® Patient Information ©2014 [employer]. ° °

## 2013-06-08 NOTE — ED Provider Notes (Signed)
CSN: [id number]     Arrival date & time 06/08/13  1108 History   First MD Initiated Contact with Patient 06/08/13 1125     Chief Complaint  Patient presents with  . Abdominal Pain     (Consider location/radiation/quality/duration/timing/severity/associated sxs/prior Treatment) HPI Comments: Patient presents with upper abdominal pain. She states over the last week she's had worsening pain to her epigastric and right upper quadrant. It does seem to get worse after she eats. She denies any fevers or chills. She's had some nausea but no vomiting. She denies any urinary symptoms. She's had some constipation but did have a small bowel movement this morning. She does feel like her abdomen slightly more distended than normal. She describes as a constant, worsening achy pain in her upper abdomen. She's also had some increased heartburn associated with the pain. She went to an urgent care center this morning where they did an acute abdominal series that was concerning for an ileus or small bowel extraction. She was sent here for further evaluation.  Patient is a 48 y.o. female presenting with abdominal pain.  Abdominal Pain Associated symptoms: nausea   Associated symptoms: no chest pain, no chills, no cough, no diarrhea, no fatigue, no fever, no hematuria and no shortness of breath     Past Medical History  Diagnosis Date  . Diabetes mellitus without complication   . Heart attack    History reviewed. No pertinent past surgical history. No family history on file. History  Substance Use Topics  . Smoking status: Current Every Day Smoker -- 1.00 packs/day for 20 years  . Smokeless tobacco: Not on file  . Alcohol Use: Yes     Comment: socially. 2-3 beers on weekend.   OB History   Grav Para Term Preterm Abortions TAB SAB Ect Mult Living                 Review of Systems  Constitutional: Negative for fever, chills, diaphoresis and fatigue.  HENT: Negative for congestion, rhinorrhea and  sneezing.   Eyes: Negative.   Respiratory: Negative for cough, chest tightness and shortness of breath.   Cardiovascular: Negative for chest pain and leg swelling.  Gastrointestinal: Positive for nausea and abdominal pain. Negative for diarrhea and blood in stool.  Genitourinary: Negative for frequency, hematuria, flank pain and difficulty urinating.  Musculoskeletal: Negative for arthralgias and back pain.  Skin: Negative for rash.  Neurological: Negative for dizziness, speech difficulty, weakness, numbness and headaches.      Allergies  Review of patient's allergies indicates no known allergies.  Home Medications   Prior to Admission medications   Medication Sig Start Date End Date Taking? Authorizing Provider  aspirin 81 MG chewable tablet Chew 81 mg by mouth every morning.   Yes Historical Provider, MD  metFORMIN (GLUCOPHAGE) 500 MG tablet Take 500 mg by mouth 2 (two) times daily with a meal.   Yes Historical Provider, MD  Multiple Vitamin (MULTIVITAMIN WITH MINERALS) TABS tablet Take 1 tablet by mouth every morning.   Yes Historical Provider, MD  insulin glargine (LANTUS) 100 UNIT/ML injection Inject 40 Units into the skin at bedtime. 02/14/12   Marinda Elk, MD   BP 128/74  Pulse 81  Temp(Src) 97.9 F (36.6 C) (Oral)  Resp 19  SpO2 99%  LMP 06/01/2013 Physical Exam  Constitutional: She is oriented to person, place, and time. She appears well-developed and well-nourished.  HENT:  Head: Normocephalic and atraumatic.  Eyes: Pupils are equal, round, and reactive  to light.  Neck: Normal range of motion. Neck supple.  Cardiovascular: Normal rate, regular rhythm and normal heart sounds.   Pulmonary/Chest: Effort normal and breath sounds normal. No respiratory distress. She has no wheezes. She has no rales. She exhibits no tenderness.  Abdominal: Soft. Bowel sounds are normal. There is tenderness (+TTP epigastric and RUQ). There is no rebound and no guarding.  No CVA  tenderness  Musculoskeletal: Normal range of motion. She exhibits no edema.  Lymphadenopathy:    She has no cervical adenopathy.  Neurological: She is alert and oriented to person, place, and time.  Skin: Skin is warm and dry. No rash noted.  Psychiatric: She has a normal mood and affect.    ED Course  Procedures (including critical care time) Labs Review Results for orders placed during the hospital encounter of 06/08/13  COMPREHENSIVE METABOLIC PANEL      Result Value Ref Range   Sodium 131 (*) 137 - 147 mEq/L   Potassium 5.1  3.7 - 5.3 mEq/L   Chloride 95 (*) 96 - 112 mEq/L   CO2 23  19 - 32 mEq/L   Glucose, Bld 311 (*) 70 - 99 mg/dL   BUN 11  6 - 23 mg/dL   Creatinine, Ser 4.780.56  0.50 - 1.10 mg/dL   Calcium 29.510.6 (*) 8.4 - 10.5 mg/dL   Total Protein 8.2  6.0 - 8.3 g/dL   Albumin 4.4  3.5 - 5.2 g/dL   AST 11  0 - 37 U/L   ALT 14  0 - 35 U/L   Alkaline Phosphatase 345 (*) 39 - 117 U/L   Total Bilirubin 0.4  0.3 - 1.2 mg/dL   GFR calc non Af Amer >90  >90 mL/min   GFR calc Af Amer >90  >90 mL/min  LIPASE, BLOOD      Result Value Ref Range   Lipase 152 (*) 11 - 59 U/L  I-STAT CHEM 8, ED      Result Value Ref Range   Sodium 133 (*) 137 - 147 mEq/L   Potassium 4.9  3.7 - 5.3 mEq/L   Chloride 100  96 - 112 mEq/L   BUN 10  6 - 23 mg/dL   Creatinine, Ser 6.210.60  0.50 - 1.10 mg/dL   Glucose, Bld 308319 (*) 70 - 99 mg/dL   Calcium, Ion 6.571.19  8.461.12 - 1.23 mmol/L   TCO2 22  0 - 100 mmol/L   Hemoglobin 15.6 (*) 12.0 - 15.0 g/dL   HCT 96.246.0  95.236.0 - 84.146.0 %  CBG MONITORING, ED      Result Value Ref Range   Glucose-Capillary 228 (*) 70 - 99 mg/dL   [place] Abdomen Complete  06/08/2013   CLINICAL DATA:  Right upper quadrant abdominal pain.  EXAM: ULTRASOUND ABDOMEN COMPLETE  COMPARISON:  No priors.  FINDINGS: Gallbladder:  No gallstones or wall thickening visualized. No sonographic Murphy sign noted.  Common bile duct:  Diameter: 6 mm  Liver:  No focal lesion identified. Within normal limits in  parenchymal echogenicity.  IVC:  No abnormality visualized.  Pancreas:  Visualized portion unremarkable.  Spleen:  Size and appearance within normal limits.  Right Kidney:  Length: 12.3 cm. Echogenicity within normal limits. No mass or hydronephrosis visualized.  Left Kidney:  Length: 11.3 cm. Echogenicity within normal limits. No mass or hydronephrosis visualized.  Abdominal aorta:  No aneurysm visualized. Measures up to 2.2 cm in diameter proximally.  Other findings:  None.  IMPRESSION: 1. No  acute findings to account for the patient's symptoms. Specifically, no evidence of gallstones or findings to suggest acute cholecystitis at this time.   Electronically Signed   By: Trudie Reed M.D.   On: 06/08/2013 14:03   Dg Abd Acute W/chest  06/08/2013   CLINICAL DATA:  Epigastric abdominal pain for 7 days  EXAM: ACUTE ABDOMEN SERIES (ABDOMEN 2 VIEW & CHEST 1 VIEW)  COMPARISON:  Chest radiograph - 02/08/2012  FINDINGS: Grossly unchanged cardiac silhouette and mediastinal contours. Post coronary artery stent placement. The lungs appear hyperexpanded. There is minimal pleural parenchymal thickening within the peripheral aspect of the right upper lung, likely unchanged. No focal airspace opacities. There is mild elevation of the right hemidiaphragm. No pleural effusion or pneumothorax. No evidence of edema.  Moderate colonic stool burden without evidence of obstruction. No pneumoperitoneum, pneumatosis or portal venous gas.  No acute osseus abnormalities.  IMPRESSION: 1. Hyperexpanded lungs without acute cardiopulmonary disease. 2. Moderate colonic stool burden without evidence of obstruction.   Electronically Signed   By: Simonne Come M.D.   On: 06/08/2013 13:07      Imaging Review US Abdomen Complete  06/08/2013   CLINICAL DATA:  Right upper quadrant abdominal pain.  EXAM: ULTRASOUND ABDOMEN COMPLETE  COMPARISON:  No priors.  FINDINGS: Gallbladder:  No gallstones or wall thickening visualized. No sonographic  Murphy sign noted.  Common bile duct:  Diameter: 6 mm  Liver:  No focal lesion identified. Within normal limits in parenchymal echogenicity.  IVC:  No abnormality visualized.  Pancreas:  Visualized portion unremarkable.  Spleen:  Size and appearance within normal limits.  Right Kidney:  Length: 12.3 cm. Echogenicity within normal limits. No mass or hydronephrosis visualized.  Left Kidney:  Length: 11.3 cm. Echogenicity within normal limits. No mass or hydronephrosis visualized.  Abdominal aorta:  No aneurysm visualized. Measures up to 2.2 cm in diameter proximally.  Other findings:  None.  IMPRESSION: 1. No acute findings to account for the patient's symptoms. Specifically, no evidence of gallstones or findings to suggest acute cholecystitis at this time.   Electronically Signed   By: Trudie Reed M.D.   On: 06/08/2013 14:03   Dg Abd Acute W/chest  06/08/2013   CLINICAL DATA:  Epigastric abdominal pain for 7 days  EXAM: ACUTE ABDOMEN SERIES (ABDOMEN 2 VIEW & CHEST 1 VIEW)  COMPARISON:  Chest radiograph - 02/08/2012  FINDINGS: Grossly unchanged cardiac silhouette and mediastinal contours. Post coronary artery stent placement. The lungs appear hyperexpanded. There is minimal pleural parenchymal thickening within the peripheral aspect of the right upper lung, likely unchanged. No focal airspace opacities. There is mild elevation of the right hemidiaphragm. No pleural effusion or pneumothorax. No evidence of edema.  Moderate colonic stool burden without evidence of obstruction. No pneumoperitoneum, pneumatosis or portal venous gas.  No acute osseus abnormalities.  IMPRESSION: 1. Hyperexpanded lungs without acute cardiopulmonary disease. 2. Moderate colonic stool burden without evidence of obstruction.   Electronically Signed   By: Simonne Come M.D.   On: 06/08/2013 13:07     EKG Interpretation None      MDM   Final diagnoses:  Pancreatitis    Patient has mild elevation in her lipase as well as  elevation to her alkaline phosphatase. She was given one dose of pain medication in ED and feels much better after that. Her gallbladder ultrasound did not show any evidence of acute gallbladder disease. There is no evidence of obstruction on the acute abdominal series. She has no  ongoing pain or vomiting. She's afebrile. I feel that she can be treated at home. I advised her to use clear liquid diet for the next 2-3 days and followup with her primary care physician on Monday. I advised her to refrain from alcohol use.  She may need a HIDA scan if her symptoms continue.  Advised to return her if she has worsening pain, vomiting, or fevers.    Rolan BuccoMelanie Adrianna Dudas, MD 06/08/13 1537

## 2013-06-09 LAB — URINE CULTURE: Colony Count: 75000

## 2013-06-11 ENCOUNTER — Encounter: Payer: Self-pay | Admitting: Family Medicine

## 2014-01-30 ENCOUNTER — Encounter (HOSPITAL_COMMUNITY): Payer: Self-pay | Admitting: Cardiology

## 2014-05-16 ENCOUNTER — Ambulatory Visit: Payer: Self-pay | Admitting: Family Medicine

## 2014-06-17 ENCOUNTER — Ambulatory Visit: Payer: Medicaid Other

## 2014-08-04 ENCOUNTER — Ambulatory Visit (INDEPENDENT_AMBULATORY_CARE_PROVIDER_SITE_OTHER): Payer: BLUE CROSS/BLUE SHIELD | Admitting: Emergency Medicine

## 2014-08-04 VITALS — BP 128/82 | HR 82 | Temp 98.4°F | Resp 17 | Ht 66.5 in | Wt 184.0 lb

## 2014-08-04 DIAGNOSIS — Z0000 Encounter for general adult medical examination without abnormal findings: Secondary | ICD-10-CM

## 2014-08-04 LAB — POCT URINALYSIS DIPSTICK
BILIRUBIN UA: NEGATIVE
GLUCOSE UA: NEGATIVE
KETONES UA: NEGATIVE
Leukocytes, UA: NEGATIVE
Nitrite, UA: NEGATIVE
PH UA: 5
Protein, UA: 30
Spec Grav, UA: 1.025
Urobilinogen, UA: 0.2

## 2014-08-04 LAB — POCT UA - MICROSCOPIC ONLY
CRYSTALS, UR, HPF, POC: NEGATIVE
Casts, Ur, LPF, POC: NEGATIVE
MUCUS UA: NEGATIVE
Yeast, UA: NEGATIVE

## 2014-08-04 NOTE — Patient Instructions (Signed)

## 2014-08-04 NOTE — Progress Notes (Signed)
Subjective:  Patient ID: Vickie Little, female    DOB: 06-04-1965  Age: 49 y.o. MRN: [id number]  CC: Employment Physical and Back Pain   HPI Vickie Little presents  or physical examination. She has type 2 diabetes and has been recently put on invokana,, in addition to her metformin. She is concerned that it may not be healthy for her to take with her concern for kidneys. Otherwise she has no acute medical concerns. She is eating well and maintaining her weight. She has no nausea vomiting diarrhea. No cough or shortness of breath or wheezing.   Outpatient Prescriptions Prior to Visit  Medication Sig Dispense Refill  . aspirin 81 MG chewable tablet Chew 81 mg by mouth every morning.    . metFORMIN (GLUCOPHAGE) 500 MG tablet Take 500 mg by mouth 2 (two) times daily with a meal.    . Multiple Vitamin (MULTIVITAMIN WITH MINERALS) TABS tablet Take 1 tablet by mouth every morning.    Marland Kitchen esomeprazole (NEXIUM) 40 MG capsule Take 1 capsule (40 mg total) by mouth daily. 30 capsule 0  . insulin glargine (LANTUS) 100 UNIT/ML injection Inject 40 Units into the skin at bedtime. 10 mL 2  . ondansetron (ZOFRAN ODT) 8 MG disintegrating tablet  ODT q4 hours prn nausea 10 tablet 0  . oxyCODONE-acetaminophen (PERCOCET) 5-325 MG per tablet Take 2 tablets by mouth every 4 (four) hours as needed. 20 tablet 0   Facility-Administered Medications Prior to Visit  Medication Dose Route Frequency Provider Last Rate Last Dose  . gi cocktail (Maalox,Lidocaine,Donnatal)  30 mL Oral Once Pearline Cables, MD        History   Social History  . Marital Status: Married    Spouse Name: N/A  . Number of Children: N/A  . Years of Education: N/A   Occupational History  . teacher     [employer]   Social History Main Topics  . Smoking status: Current Every Day Smoker -- 1.00 packs/day for 20 years  . Smokeless tobacco: Not on file  . Alcohol Use: Yes     Comment: socially. 2-3 beers on  weekend.  . Drug Use: No  . Sexual Activity: Yes   Other Topics Concern  . None   Social History Narrative    No family history on file.  Past Medical History  Diagnosis Date  . Diabetes mellitus without complication   . Heart attack      Review of Systems  Constitutional: Negative for fever, chills and appetite change.  HENT: Negative for congestion, ear pain, postnasal drip, sinus pressure and sore throat.   Eyes: Negative for pain and redness.  Respiratory: Negative for cough, shortness of breath and wheezing.   Cardiovascular: Negative for leg swelling.  Gastrointestinal: Negative for nausea, vomiting, abdominal pain, diarrhea, constipation and blood in stool.  Endocrine: Negative for polyuria.  Genitourinary: Negative for dysuria, urgency, frequency and flank pain.  Musculoskeletal: Negative for gait problem.  Skin: Negative for rash.  Neurological: Negative for weakness and headaches.  Psychiatric/Behavioral: Negative for confusion and decreased concentration. The patient is not nervous/anxious.     Objective:  BP 128/82 mmHg  Pulse 82  Temp(Src) 98.4 F (36.9 C) (Oral)  Resp 17  Ht 5' 6.5" (1.689 m)  Wt 184 lb (83.462 kg)  BMI 29.26 kg/m2  SpO2 99%  LMP 07/30/2014  BP Readings from Last 3 Encounters:  08/04/14 128/82  06/08/13 122/65  06/08/13 118/74    Wt Readings  from Last 3 Encounters:  08/04/14 184 lb (83.462 kg)  06/08/13 186 lb (84.369 kg)  02/06/13 189 lb 6.4 oz (85.911 kg)    Physical Exam  Constitutional: She is oriented to person, place, and time. She appears well-developed and well-nourished. No distress.  HENT:  Head: Normocephalic and atraumatic.  Right Ear: External ear normal.  Left Ear: External ear normal.  Nose: Nose normal.  Eyes: Conjunctivae and EOM are normal. Pupils are equal, round, and reactive to light. No scleral icterus.  Neck: Normal range of motion. Neck supple. No tracheal deviation present.  Cardiovascular:  Normal rate, regular rhythm and normal heart sounds.   Pulmonary/Chest: Effort normal. No respiratory distress. She has no wheezes. She has no rales.  Abdominal: She exhibits no mass. There is no tenderness. There is no rebound and no guarding.  Musculoskeletal: She exhibits no edema.  Lymphadenopathy:    She has no cervical adenopathy.  Neurological: She is alert and oriented to person, place, and time. Coordination normal.  Skin: Skin is warm and dry. No rash noted.  Psychiatric: She has a normal mood and affect. Her behavior is normal.    Lab Results  Component Value Date   WBC 7.4 06/08/2013   HGB 15.6* 06/08/2013   HCT 46.0 06/08/2013   PLT 362 02/14/2012   GLUCOSE 319* 06/08/2013   CHOL 132 02/09/2012   TRIG 209* 02/09/2012   HDL 6* 02/09/2012   LDLCALC 84 02/09/2012   ALT 14 06/08/2013   AST 11 06/08/2013   NA 133* 06/08/2013   K 4.9 06/08/2013   CL 100 06/08/2013   CREATININE 0.60 06/08/2013   BUN 10 06/08/2013   CO2 23 06/08/2013   TSH 0.502 12/15/2008   INR 1.06 02/12/2012   HGBA1C 13.0* 02/08/2012   MICROALBUR 2.20* 10/12/2009      .  Assessment & Plan:   Vickie Little was seen today for employment physical and back pain.  Diagnoses and all orders for this visit:  Annual physical exam Orders: -     CBC with Differential/Platelet -     Lipid panel -     Comprehensive metabolic panel -     POCT UA - Microscopic Only -     POCT urinalysis dipstick  I strongly advised patient to stop smoking. She says she is a casual smoker always make smoking when someone will give her cigarette. Smokes less than a pack a day.   I have discontinued [name]'s insulin glargine, oxyCODONE-acetaminophen, ondansetron, and esomeprazole. I am also having her maintain her metFORMIN, multivitamin with minerals, aspirin, and Canagliflozin-Metformin HCl. We will continue to administer gi cocktail.  Meds ordered this encounter  Medications  . Canagliflozin-Metformin HCl 150-500  MG TABS    Sig: Take by mouth.    Appropriate red flag conditions were discussed with the patient as well as actions that should be taken.  Patient expressed his understanding.  Follow-up: Return if symptoms worsen or fail to improve.  Carmelina Dane, MD

## 2014-08-05 LAB — COMPREHENSIVE METABOLIC PANEL
ALK PHOS: 265 U/L — AB (ref 39–117)
ALT: 12 U/L (ref 0–35)
AST: 10 U/L (ref 0–37)
Albumin: 4.5 g/dL (ref 3.5–5.2)
BILIRUBIN TOTAL: 0.6 mg/dL (ref 0.2–1.2)
BUN: 9 mg/dL (ref 6–23)
CO2: 25 mEq/L (ref 19–32)
CREATININE: 0.48 mg/dL — AB (ref 0.50–1.10)
Calcium: 9.6 mg/dL (ref 8.4–10.5)
Chloride: 101 mEq/L (ref 96–112)
Glucose, Bld: 178 mg/dL — ABNORMAL HIGH (ref 70–99)
POTASSIUM: 4.2 meq/L (ref 3.5–5.3)
Sodium: 135 mEq/L (ref 135–145)
Total Protein: 7 g/dL (ref 6.0–8.3)

## 2014-08-05 LAB — CBC WITH DIFFERENTIAL/PLATELET
BASOS ABS: 0.1 10*3/uL (ref 0.0–0.1)
Basophils Relative: 1 % (ref 0–1)
Eosinophils Absolute: 0.1 10*3/uL (ref 0.0–0.7)
Eosinophils Relative: 2 % (ref 0–5)
HCT: 40 % (ref 36.0–46.0)
HEMOGLOBIN: 12.9 g/dL (ref 12.0–15.0)
LYMPHS ABS: 2.6 10*3/uL (ref 0.7–4.0)
LYMPHS PCT: 43 % (ref 12–46)
MCH: 26.9 pg (ref 26.0–34.0)
MCHC: 32.3 g/dL (ref 30.0–36.0)
MCV: 83.5 fL (ref 78.0–100.0)
MONO ABS: 0.5 10*3/uL (ref 0.1–1.0)
MPV: 10.9 fL (ref 8.6–12.4)
Monocytes Relative: 9 % (ref 3–12)
NEUTROS ABS: 2.7 10*3/uL (ref 1.7–7.7)
Neutrophils Relative %: 45 % (ref 43–77)
PLATELETS: 294 10*3/uL (ref 150–400)
RBC: 4.79 MIL/uL (ref 3.87–5.11)
RDW: 14.7 % (ref 11.5–15.5)
WBC: 6 10*3/uL (ref 4.0–10.5)

## 2014-08-05 LAB — LIPID PANEL
CHOLESTEROL: 160 mg/dL (ref 0–200)
HDL: 49 mg/dL (ref >=46)
LDL Cholesterol: 90 mg/dL (ref 0–99)
Total CHOL/HDL Ratio: 3.3 Ratio
Triglycerides: 107 mg/dL (ref <150)
VLDL: 21 mg/dL (ref 0–40)

## 2014-08-07 ENCOUNTER — Telehealth: Payer: Self-pay

## 2014-08-07 NOTE — Telephone Encounter (Signed)
Pt wants to know if you can give her something for pain since all of her labs looked good. She says her back isn't geetting any better and it is worse at night. Thanks

## 2014-08-07 NOTE — Telephone Encounter (Signed)
She should be seen again.

## 2014-08-11 NOTE — Telephone Encounter (Signed)
LMOM to RTC if not well.  

## 2014-12-14 ENCOUNTER — Ambulatory Visit (INDEPENDENT_AMBULATORY_CARE_PROVIDER_SITE_OTHER): Payer: BLUE CROSS/BLUE SHIELD

## 2014-12-14 ENCOUNTER — Ambulatory Visit (INDEPENDENT_AMBULATORY_CARE_PROVIDER_SITE_OTHER): Payer: BLUE CROSS/BLUE SHIELD | Admitting: Physician Assistant

## 2014-12-14 VITALS — BP 110/80 | HR 83 | Temp 97.8°F | Resp 18 | Ht 65.5 in | Wt 182.1 lb

## 2014-12-14 DIAGNOSIS — K5900 Constipation, unspecified: Secondary | ICD-10-CM

## 2014-12-14 DIAGNOSIS — R109 Unspecified abdominal pain: Secondary | ICD-10-CM | POA: Diagnosis not present

## 2014-12-14 LAB — COMPLETE METABOLIC PANEL WITH GFR
ALBUMIN: 4.4 g/dL (ref 3.6–5.1)
ALK PHOS: 289 U/L — AB (ref 33–115)
ALT: 11 U/L (ref 6–29)
AST: 10 U/L (ref 10–35)
BILIRUBIN TOTAL: 0.6 mg/dL (ref 0.2–1.2)
BUN: 12 mg/dL (ref 7–25)
CALCIUM: 9.9 mg/dL (ref 8.6–10.2)
CO2: 25 mmol/L (ref 20–31)
Chloride: 99 mmol/L (ref 98–110)
Creat: 0.58 mg/dL (ref 0.50–1.10)
GFR, Est Non African American: >89 mL/min (ref >=60)
GLUCOSE: 132 mg/dL — AB (ref 65–99)
POTASSIUM: 4.4 mmol/L (ref 3.5–5.3)
Sodium: 134 mmol/L — ABNORMAL LOW (ref 135–146)
Total Protein: 7.2 g/dL (ref 6.1–8.1)

## 2014-12-14 LAB — POCT URINALYSIS DIP (MANUAL ENTRY)
BILIRUBIN UA: NEGATIVE
Leukocytes, UA: NEGATIVE
NITRITE UA: NEGATIVE
SPEC GRAV UA: 1.015
Urobilinogen, UA: 0.2
pH, UA: 5

## 2014-12-14 LAB — POCT URINE PREGNANCY: Preg Test, Ur: NEGATIVE

## 2014-12-14 LAB — POCT CBC
GRANULOCYTE PERCENT: 47.1 % (ref 37–80)
HEMATOCRIT: 40 % (ref 37.7–47.9)
Hemoglobin: 13.5 g/dL (ref 12.2–16.2)
Lymph, poc: 2.5 (ref 0.6–3.4)
MCH: 26.8 pg — AB (ref 27–31.2)
MCHC: 33.8 g/dL (ref 31.8–35.4)
MCV: 79.2 fL — AB (ref 80–97)
MID (CBC): 0.7 (ref 0–0.9)
MPV: 8.3 fL (ref 0–99.8)
POC Granulocyte: 2.9 (ref 2–6.9)
POC LYMPH PERCENT: 41.4 %L (ref 10–50)
POC MID %: 11.5 % (ref 0–12)
Platelet Count, POC: 262 10*3/uL (ref 142–424)
RBC: 5.04 M/uL (ref 4.04–5.48)
RDW, POC: 13.9 %
WBC: 6.1 10*3/uL (ref 4.6–10.2)

## 2014-12-14 LAB — POC MICROSCOPIC URINALYSIS (UMFC): MUCUS RE: ABSENT

## 2014-12-14 MED ORDER — POLYETHYLENE GLYCOL 3350 17 GM/SCOOP PO POWD: 17.0000 g | Freq: Two times a day (BID) | ORAL | Status: DC | PRN

## 2014-12-14 MED ORDER — DOCUSATE SODIUM 100 MG PO CAPS: 100.0000 mg | ORAL_CAPSULE | Freq: Two times a day (BID) | ORAL | Status: DC

## 2014-12-14 NOTE — Progress Notes (Signed)
[facility] [address], [place] [id number] [phone]  Date:  12/14/2014   Name:  Vickie IlesShenita M Little   DOB:  09/28/1965   MRN:  [id number]  PCP:  Dois DavenportICHTER,KAREN L., MD    History of Present Illness:  Vickie Little is a 49 y.o. female patient who [facility].   Right sided discomfort for almost 1 week that has progressively worsened today.  Right sided pain is an ache that wax and wanes.  It radiates to the back.  No fever or nausea.  No dysuria, frequency, or hematuria.  She has no abnormal vaginal discharge, odor, or rash.  Hx of constipation.  Takes metamucil. Diabetes with morning glucose between 140-145.     Patient Active Problem List   Diagnosis Date Noted  . Sepsis (HCC) 02/13/2012  . Diabetes mellitus type II, uncontrolled (HCC) 02/10/2012  . Bacteremia due to Gram-negative bacteria (HCC) 02/10/2012  . Hypertriglyceridemia 02/09/2012  . UTI (lower urinary tract infection) 02/09/2012  . Flu-like symptoms 02/09/2012  . NSTEMI (non-ST elevated myocardial infarction) (HCC) 02/08/2012    Past Medical History  Diagnosis Date  . Diabetes mellitus without complication (HCC)   . Heart attack [facility](HCC)     Past Surgical History  Procedure Laterality Date  . Left heart catheterization with coronary angiogram N/A 02/13/2012    Procedure: LEFT HEART CATHETERIZATION WITH CORONARY ANGIOGRAM;  Surgeon: Donato SchultzMark Skains, MD;  [facility];  Service: Cardiovascular;  Laterality: N/A;    Social History  Substance Use Topics  . Smoking status: Current Every Day Smoker -- 1.00 packs/day for 20 years  . Smokeless tobacco: None  . Alcohol Use: Yes     Comment: socially. 2-3 beers on weekend.    No family history on file.  No Known Allergies  Medication list has been reviewed and updated.  Current Outpatient Prescriptions on File Prior to Visit  Medication Sig Dispense Refill  . aspirin 81 MG chewable tablet Chew 81 mg by  mouth every morning.    . Canagliflozin-Metformin HCl 150-500 MG TABS Take by mouth.    . Multiple Vitamin (MULTIVITAMIN WITH MINERALS) TABS tablet Take 1 tablet by mouth every morning.    . metFORMIN (GLUCOPHAGE) 500 MG tablet Take 500 mg by mouth 2 (two) times daily with a meal.     Current Facility-Administered Medications on File Prior to Visit  Medication Dose Route Frequency Provider Last Rate Last Dose  . gi cocktail (Maalox,Lidocaine,Donnatal)  30 mL Oral Once Jessica C Copland, MD        ROS ROS otherwise unremarkable unless listed above.   Physical Examination: BP 110/80 mmHg  Pulse 83  Temp(Src) 97.8 F (36.6 C) (Oral)  Resp 18  Ht 5' 5.5" (1.664 m)  Wt 182 lb 2 oz (82.611 kg)  BMI 29.84 kg/m2  SpO2 98%  LMP 12/09/2014 (Exact Date) Ideal Body Weight: Weight in (lb) to have BMI = 25: 152.2 Calcifications in the kidneys, densities in the pelvis questionables stones vs phlebolith Wt Readings from Last 3 Encounters:  12/14/14 182 lb 2 oz (82.611 kg)  08/04/14 184 lb (83.462 kg)  06/08/13 186 lb (84.369 kg)    Physical Exam  Constitutional: She is oriented to person, place, and time. She appears well-developed and well-nourished. No distress.  HENT:  Head: Normocephalic and atraumatic.  Right Ear: External ear normal.  Left Ear: External ear normal.  Eyes: Conjunctivae and EOM are normal. Pupils are  equal, round, and reactive to light.  Cardiovascular: Normal rate.   Pulmonary/Chest: Effort normal. No respiratory distress.  Abdominal: There is no hepatosplenomegaly. There is tenderness in the right lower quadrant and periumbilical area. There is no tenderness at McBurney's point and negative Murphy's sign.  Neurological: She is alert and oriented to person, place, and time.  Skin: She is not diaphoretic.  Psychiatric: She has a normal mood and affect. Her behavior is normal.    Results for orders placed or performed in visit on 12/14/14  POCT CBC  Result Value  Ref Range   WBC 6.1 4.6 - 10.2 K/uL   Lymph, poc 2.5 0.6 - 3.4   POC LYMPH PERCENT 41.4 10 - 50 %L   MID (cbc) 0.7 0 - 0.9   POC MID % 11.5 0 - 12 %M   POC Granulocyte 2.9 2 - 6.9   Granulocyte percent 47.1 37 - 80 %G   RBC 5.04 4.04 - 5.48 M/uL   Hemoglobin 13.5 12.2 - 16.2 g/dL   HCT, POC 13.0 86.5 - 47.9 %   MCV 79.2 (A) 80 - 97 fL   MCH, POC 26.8 (A) 27 - 31.2 pg   MCHC 33.8 31.8 - 35.4 g/dL   RDW, POC 78.4 %   Platelet Count, POC 262 142 - 424 K/uL   MPV 8.3 0 - 99.8 fL  POCT urinalysis dipstick  Result Value Ref Range   Color, UA yellow yellow   Clarity, UA clear clear   Glucose, UA =500 (A) negative   Bilirubin, UA negative negative   Ketones, POC UA small (15) (A) negative   Spec Grav, UA 1.015    Blood, UA trace-lysed (A) negative   pH, UA 5.0    Protein Ur, POC trace (A) negative   Urobilinogen, UA 0.2    Nitrite, UA Negative Negative   Leukocytes, UA Negative Negative  POCT Microscopic Urinalysis ([facility])  Result Value Ref Range   WBC,UR,HPF,POC Few (A) None WBC/hpf   RBC,UR,HPF,POC None None RBC/hpf   Bacteria Moderate (A) None, Too numerous to count   Mucus Absent Absent   Epithelial Cells, UR Per Microscopy Many (A) None, Too numerous to count cells/hpf  POCT urine pregnancy  Result Value Ref Range   Preg Test, Ur Negative Negative     Assessment and Plan: Vickie Little is a 49 y.o. female with PMH of DM2, pancreatitis,who is here today for chief complaint of right flank pain. This appears to be more likely due to stool burden.  Treating at this time with miralax and colace.  Advised to return if pain does not resolve in 48 hours.      Right flank pain - Plan: POCT CBC, POCT urinalysis dipstick, POCT Microscopic Urinalysis ([facility]), POCT urine pregnancy, COMPLETE METABOLIC PANEL WITH GFR, DG Abd 1 View, polyethylene glycol powder (GLYCOLAX/MIRALAX) powder  Constipation, unspecified constipation type - Plan: polyethylene glycol powder (GLYCOLAX/MIRALAX)  powder, docusate sodium (COLACE) 100 MG capsule   Trena Platt, PA-C [facility] 12/14/2014 12:27 PM

## 2014-12-14 NOTE — Patient Instructions (Signed)
I would like you to attempt to take these two medications to get rid of the constipation over the next week.   There is heavy stool at this right side, which likely is contributing to your right sided pain.   If you are not feeling any better, I would like you to return in 48 hours.  If you are feeling worse, you will need to return.  Constipation, Adult Constipation is when a person has fewer than three bowel movements a week, has difficulty having a bowel movement, or has stools that are dry, hard, or larger than normal. As people grow older, constipation is more common. A low-fiber diet, not taking in enough fluids, and taking certain medicines may make constipation worse.  CAUSES   Certain medicines, such as antidepressants, pain medicine, iron supplements, antacids, and water pills.   Certain diseases, such as diabetes, irritable bowel syndrome (IBS), thyroid disease, or depression.   Not drinking enough water.   Not eating enough fiber-rich foods.   Stress or travel.   Lack of physical activity or exercise.   Ignoring the urge to have a bowel movement.   Using laxatives too much.  SIGNS AND SYMPTOMS   Having fewer than three bowel movements a week.   Straining to have a bowel movement.   Having stools that are hard, dry, or larger than normal.   Feeling full or bloated.   Pain in the lower abdomen.   Not feeling relief after having a bowel movement.  DIAGNOSIS  Your health care provider will take a medical history and perform a physical exam. Further testing may be done for severe constipation. Some tests may include:  A barium enema X-ray to examine your rectum, colon, and, sometimes, your small intestine.   A sigmoidoscopy to examine your lower colon.   A colonoscopy to examine your entire colon. TREATMENT  Treatment will depend on the severity of your constipation and what is causing it. Some dietary treatments include drinking more fluids and  eating more fiber-rich foods. Lifestyle treatments may include regular exercise. If these diet and lifestyle recommendations do not help, your health care provider may recommend taking over-the-counter laxative medicines to help you have bowel movements. Prescription medicines may be prescribed if over-the-counter medicines do not work.  HOME CARE INSTRUCTIONS   Eat foods that have a lot of fiber, such as fruits, vegetables, whole grains, and beans.  Limit foods high in fat and processed sugars, such as french fries, hamburgers, cookies, candies, and soda.   A fiber supplement may be added to your diet if you cannot get enough fiber from foods.   Drink enough fluids to keep your urine clear or pale yellow.   Exercise regularly or as directed by your health care provider.   Go to the restroom when you have the urge to go. Do not hold it.   Only take over-the-counter or prescription medicines as directed by your health care provider. Do not take other medicines for constipation without talking to your health care provider first.  SEEK IMMEDIATE MEDICAL CARE IF:   You have bright red blood in your stool.   Your constipation lasts for more than 4 days or gets worse.   You have abdominal or rectal pain.   You have thin, pencil-like stools.   You have unexplained weight loss. MAKE SURE YOU:   Understand these instructions.  Will watch your condition.  Will get help right away if you are not doing well or get worse.  This information is not intended to replace advice given to you by your health care provider. Make sure you discuss any questions you have with your health care provider.   Document Released: 11/06/2003 Document Revised: 02/28/2014 Document Reviewed: 11/19/2012 Elsevier Interactive Patient Education [employer]

## 2015-06-23 ENCOUNTER — Encounter: Payer: Self-pay | Admitting: Physician Assistant

## 2015-06-23 DIAGNOSIS — A5901 Trichomonal vulvovaginitis: Secondary | ICD-10-CM | POA: Insufficient documentation

## 2016-01-23 ENCOUNTER — Emergency Department (HOSPITAL_COMMUNITY)
Admission: EM | Admit: 2016-01-23 | Discharge: 2016-01-23 | Disposition: A | Discharge status: Home / Self Care | Payer: BLUE CROSS/BLUE SHIELD | Attending: Emergency Medicine | Admitting: Emergency Medicine

## 2016-01-23 ENCOUNTER — Encounter (HOSPITAL_COMMUNITY): Payer: Self-pay | Admitting: Emergency Medicine

## 2016-01-23 DIAGNOSIS — M79662 Pain in left lower leg: Secondary | ICD-10-CM | POA: Diagnosis present

## 2016-01-23 DIAGNOSIS — I10 Essential (primary) hypertension: Secondary | ICD-10-CM | POA: Insufficient documentation

## 2016-01-23 DIAGNOSIS — Z794 Long term (current) use of insulin: Secondary | ICD-10-CM | POA: Insufficient documentation

## 2016-01-23 DIAGNOSIS — Z982 Presence of cerebrospinal fluid drainage device: Secondary | ICD-10-CM | POA: Insufficient documentation

## 2016-01-23 DIAGNOSIS — E119 Type 2 diabetes mellitus without complications: Secondary | ICD-10-CM | POA: Diagnosis not present

## 2016-01-23 DIAGNOSIS — Z955 Presence of coronary angioplasty implant and graft: Secondary | ICD-10-CM | POA: Insufficient documentation

## 2016-01-23 DIAGNOSIS — M79605 Pain in left leg: Secondary | ICD-10-CM

## 2016-01-23 DIAGNOSIS — Z87891 Personal history of nicotine dependence: Secondary | ICD-10-CM | POA: Insufficient documentation

## 2016-01-23 DIAGNOSIS — Z79899 Other long term (current) drug therapy: Secondary | ICD-10-CM | POA: Insufficient documentation

## 2016-01-23 HISTORY — DX: Essential (primary) hypertension: I10

## 2016-01-23 LAB — I-STAT CHEM 8, ED
BUN: 11 mg/dL (ref 6–20)
CALCIUM ION: 1.15 mmol/L (ref 1.15–1.40)
Chloride: 101 mmol/L (ref 101–111)
Creatinine, Ser: 0.5 mg/dL (ref 0.44–1.00)
GLUCOSE: 214 mg/dL — AB (ref 65–99)
HCT: 40 % (ref 36.0–46.0)
HEMOGLOBIN: 13.6 g/dL (ref 12.0–15.0)
POTASSIUM: 4.3 mmol/L (ref 3.5–5.1)
Sodium: 136 mmol/L (ref 135–145)
TCO2: 24 mmol/L (ref 0–100)

## 2016-01-23 LAB — D-DIMER, QUANTITATIVE (NOT AT ARMC): D DIMER QUANT: 0.38 ug{FEU}/mL (ref 0.00–0.50)

## 2016-01-23 MED ORDER — IBUPROFEN 600 MG PO TABS: 600.0000 mg | ORAL_TABLET | Freq: Three times a day (TID) | ORAL | 0 refills | Status: DC

## 2016-01-23 MED ORDER — NAPROXEN 500 MG PO TABS
500.0000 mg | ORAL_TABLET | Freq: Once | ORAL | Status: AC
  Administered 2016-01-23: 500 mg via ORAL
  Filled 2016-01-23: qty 1

## 2016-01-23 NOTE — Discharge Instructions (Signed)
Please take the meds prescribed.  Sounds like impingement syndrome vs. Neuropathic pain. Your primary doctor would be be st able to help you out in the long run with the diagnosing and treatment. No blood clot per lab results here.

## 2016-01-23 NOTE — ED Triage Notes (Signed)
Pt reports recurrent pain in l/lower calf radiating up to buttocks. Seen by PCP 2 days ago. Pain is recurrent after rest, decreased with walking. Pain noted one month ago. Timing consistent with initiating medication for hypertension.

## 2016-01-23 NOTE — ED Provider Notes (Signed)
[facility]-EMERGENCY DEPT Provider Note   CSN: [id number] Arrival date & time: 01/23/16  [id number]     History   Chief Complaint Chief Complaint  Patient presents with  . Leg Pain    pain in leg x 1 month    HPI Vickie Little is a 50 y.o. female.  HPI Pt comes in with cc of leg pain. Leg pain x 1 month. Pain is in the L leg, and starts at the calf and radiates up. Pt has lower back pain as well. Pt has no hx of PE, DVT and denies any exogenous estrogen use, long distance travels or surgery in the past 6 weeks, active cancer, recent immobilization. Pt denies any trauma. Pt has seen PCP, who has started pt on gabapentin.  Past Medical History:  Diagnosis Date  . Diabetes mellitus without complication (HCC)   . Heart attack   . Hypertension     Patient Active Problem List   Diagnosis Date Noted  . Trichomonas vaginitis 06/23/2015  . Sepsis (HCC) 02/13/2012  . Diabetes mellitus type II, uncontrolled (HCC) 02/10/2012  . Bacteremia due to Gram-negative bacteria 02/10/2012  . Hypertriglyceridemia 02/09/2012  . UTI (lower urinary tract infection) 02/09/2012  . Flu-like symptoms 02/09/2012  . NSTEMI (non-ST elevated myocardial infarction) (HCC) 02/08/2012    Past Surgical History:  Procedure Laterality Date  . CORONARY ANGIOPLASTY WITH STENT PLACEMENT    . LEFT HEART CATHETERIZATION WITH CORONARY ANGIOGRAM N/A 02/13/2012   Procedure: LEFT HEART CATHETERIZATION WITH CORONARY ANGIOGRAM;  Surgeon: Donato SchultzMark Skains, MD;  [facility];  Service: Cardiovascular;  Laterality: N/A;    OB History    No data available       Home Medications    Prior to Admission medications   Medication Sig Start Date End Date Taking? Authorizing Provider  amLODipine (NORVASC) 10 MG tablet Take 10 mg by mouth daily.  01/21/16  Yes Historical Provider, MD  aspirin 81 MG chewable tablet Chew 81 mg by mouth every morning.   Yes Historical Provider, MD  gabapentin (NEURONTIN) 300 MG capsule Take  300 mg by mouth 3 (three) times daily.  01/21/16  Yes Historical Provider, MD  Insulin Glargine (BASAGLAR KWIKPEN) 100 UNIT/ML SOPN Inject into the skin daily. Inject 0.1 mL (10 Units total) under the skin daily. Increase by 1 unit each day if glucose > 110 11/20/15  Yes Historical Provider, MD  metFORMIN (GLUCOPHAGE) 500 MG tablet Take 500 mg by mouth 2 (two) times daily with a meal.   Yes Historical Provider, MD  Multiple Vitamin (MULTIVITAMIN WITH MINERALS) TABS tablet Take 1 tablet by mouth every morning.   Yes Historical Provider, MD  polyethylene glycol powder (GLYCOLAX/MIRALAX) powder Take 17 g by mouth 2 (two) times daily as needed. 12/14/14  Yes Stephanie D English, PA  docusate sodium (COLACE) 100 MG capsule Take 1 capsule (100 mg total) by mouth 2 (two) times daily. Patient not taking: Reported on 01/23/2016 12/14/14   Collie SiadStephanie D English, PA  ibuprofen (ADVIL,MOTRIN) 600 MG tablet Take 1 tablet (600 mg total) by mouth every 8 (eight) hours. 01/23/16   Derwood KaplanAnkit Virat Prather, MD    Family History Family History  Problem Relation Age of Onset  . Adopted: Yes  . Diabetes Father     Social History Social History  Substance Use Topics  . Smoking status: Former Smoker    Packs/day: 0.00    Years: 20.00    Quit date: 08/22/2015  . Smokeless tobacco: Never Used  .  Alcohol use Yes     Comment: socially. 2-3 beers on weekend.     Allergies   Patient has no known allergies.   Review of Systems Review of Systems  Constitutional: Negative for activity change.  Respiratory: Negative for shortness of breath.   Cardiovascular: Negative for chest pain.  Musculoskeletal: Positive for arthralgias and back pain.  Neurological: Negative for numbness.    ROS      Physical Exam Updated Vital Signs BP 130/82 (BP Location: Right Arm)   Pulse 72   Temp 98 F (36.7 C) (Oral)   Resp 17   Ht 5\' 6"  (1.676 m)   Wt 187 lb (84.8 kg)   LMP 01/02/2016 (Approximate)   SpO2 94%   BMI 30.18  kg/m   Physical Exam  Constitutional: She is oriented to person, place, and time. She appears well-developed and well-nourished.  HENT:  Head: Normocephalic and atraumatic.  Eyes: EOM are normal. Pupils are equal, round, and reactive to light.  Neck: Neck supple.  Cardiovascular: Normal rate, regular rhythm and normal heart sounds.   No murmur heard. Pulmonary/Chest: Effort normal. No respiratory distress.  Abdominal: Soft. She exhibits no distension. There is no tenderness. There is no rebound and no guarding.  Musculoskeletal:  Negative passive straight leg test. + calf tenderness.  Neurological: She is alert and oriented to person, place, and time.  Skin: Skin is warm and dry.  Nursing note and vitals reviewed.    ED Treatments / Results  Labs (all labs ordered are listed, but only abnormal results are displayed) Labs Reviewed  I-STAT CHEM 8, ED - Abnormal; Notable for the following:       Result Value   Glucose, Bld 214 (*)    All other components within normal limits  D-DIMER, QUANTITATIVE (NOT AT [facility])    EKG  EKG Interpretation None       Radiology No results found.  Procedures Procedures (including critical care time)  Medications Ordered in ED Medications  naproxen (NAPROSYN) tablet 500 mg (500 mg Oral Given 01/23/16 1152)     Initial Impression / Assessment and Plan / ED Course  I have reviewed the triage vital signs and the nursing notes.  Pertinent labs & imaging results that were available during my care of the patient were reviewed by me and considered in my medical decision making (see chart for details).  Clinical Course     Pt with atypical pain. Dimer is neg - so with a low pretest probability for DVT in 1st place ,we feel comfortable not getting US. Pain is possibly impingement type pain. PCP has a plan and is treating the pain as neuropathy currently. Advised pt to continue taking her meds that pcp has prescribed and add  antiinflammatory to see if her pain improves. Close f/u recommended.  Final Clinical Impressions(s) / ED Diagnoses   Final diagnoses:  Leg pain, anterior, left    New Prescriptions Discharge Medication List as of 01/23/2016  2:05 PM    START taking these medications   Details  ibuprofen (ADVIL,MOTRIN) 600 MG tablet Take 1 tablet (600 mg total) by mouth every 8 (eight) hours., Starting Sat 01/23/2016, Print         Derwood KaplanAnkit Jasher Barkan, MD 01/25/16 0028

## 2016-01-23 NOTE — ED Notes (Signed)
Bed: WTR6 Expected date:  Expected time:  Means of arrival:  Comments: 

## 2016-01-23 NOTE — ED Notes (Signed)
Spoke to MD about patient, MD to review and see patient

## 2016-01-23 NOTE — ED Notes (Signed)
Patient d/c's self care.  F/U and medications reviewed.  Patient verbalized understanding. 

## 2016-03-15 ENCOUNTER — Other Ambulatory Visit: Payer: Self-pay | Admitting: Orthopedic Surgery

## 2016-03-15 DIAGNOSIS — M5442 Lumbago with sciatica, left side: Secondary | ICD-10-CM

## 2016-03-22 ENCOUNTER — Ambulatory Visit
Admission: RE | Admit: 2016-03-22 | Discharge: 2016-03-22 | Disposition: A | Discharge status: Home / Self Care | Payer: BLUE CROSS/BLUE SHIELD | Source: Ambulatory Visit | Attending: Orthopedic Surgery | Admitting: Orthopedic Surgery

## 2016-03-22 DIAGNOSIS — M5442 Lumbago with sciatica, left side: Secondary | ICD-10-CM

## 2016-03-22 MED ORDER — ONDANSETRON HCL 4 MG/2ML IJ SOLN: 4.0000 mg | Freq: Four times a day (QID) | INTRAMUSCULAR | Status: DC | PRN

## 2016-03-22 MED ORDER — DIAZEPAM 5 MG PO TABS
10.0000 mg | ORAL_TABLET | Freq: Once | ORAL | Status: AC
  Administered 2016-03-22: 10 mg via ORAL

## 2016-03-22 MED ORDER — IOPAMIDOL (ISOVUE-M 200) INJECTION 41%
15.0000 mL | Freq: Once | INTRAMUSCULAR | Status: AC
  Administered 2016-03-22: 15 mL via INTRATHECAL

## 2016-03-22 MED ORDER — OXYCODONE-ACETAMINOPHEN 5-325 MG PO TABS
1.0000 | ORAL_TABLET | Freq: Once | ORAL | Status: AC
Start: 2016-03-22 — End: 2016-03-22
  Administered 2016-03-22: 1 via ORAL

## 2016-03-22 NOTE — Discharge Instructions (Signed)

## 2016-04-12 ENCOUNTER — Encounter (HOSPITAL_COMMUNITY): Payer: Self-pay

## 2016-04-12 ENCOUNTER — Other Ambulatory Visit (HOSPITAL_COMMUNITY): Payer: Self-pay | Admitting: *Deleted

## 2016-04-12 NOTE — Patient Instructions (Signed)
Vickie Little  04/12/2016   Your procedure is scheduled on: 04-20-16  Report to [facility]  Entrance take [facility]  elevators to 3rd floor to  Short Stay Center at  1100 AM.  Call this number if you have problems the morning of surgery [phone]   Remember: ONLY 1 PERSON MAY GO WITH YOU TO SHORT STAY TO GET  READY MORNING OF YOUR SURGERY.  Do not eat food :After Midnight.CLEAR LIQUIDS FROM MIDNIGHT Tuesday NIGHT UNTIL 700 AM , THEN NOTHING BY MOUTH AFTER 700 AM DAY OF SURGERY.     Take these medicines the morning of surgery with A SIP OF WATER: AMLODIPINE(NORVASC), GABAPENTIN (NEURONTIN), HYDROCODONE IF NEEDED                               You may not have any metal on your body including hair pins and              piercings  Do not wear jewelry, make-up, lotions, powders or perfumes, deodorant             Do not wear nail polish.  Do not shave  48 hours prior to surgery.              Men may shave face and neck.   Do not bring valuables to the hospital. [place] IS NOT             RESPONSIBLE   FOR VALUABLES.  Contacts, dentures or bridgework may not be worn into surgery.  Leave suitcase in the car. After surgery it may be brought to your room.                  Please read over the following fact sheets you were given: _____________________________________________________________________             How to Manage Your Diabetes Before and After Surgery  Why is it important to control my blood sugar before and after surgery? . Improving blood sugar levels before and after surgery helps healing and can limit problems. . A way of improving blood sugar control is eating a healthy diet by: o  Eating less sugar and carbohydrates o  Increasing activity/exercise o  Talking with your doctor about reaching your blood sugar goals . High blood sugars (greater than 180 mg/dL) can raise your risk of infections and slow your recovery, so you will need to  focus on controlling your diabetes during the weeks before surgery. . Make sure that the doctor who takes care of your diabetes knows about your planned surgery including the date and location.  How do I manage my blood sugar before surgery? . Check your blood sugar at least 4 times a day, starting 2 days before surgery, to make sure that the level is not too high or low. o Check your blood sugar the morning of your surgery when you wake up and every 2 hours until you get to the Short Stay unit. . If your blood sugar is less than 70 mg/dL, you will need to treat for low blood sugar: o Do not take insulin. o Treat a low blood sugar (less than 70 mg/dL) with  cup of clear juice (cranberry or apple), 4 glucose tablets, OR glucose gel. o Recheck blood sugar in 15 minutes after treatment (to make sure  it is greater than 70 mg/dL). If your blood sugar is not greater than 70 mg/dL on recheck, call [phone] for further instructions. . Report your blood sugar to the short stay nurse when you get to Short Stay.  . If you are admitted to the hospital after surgery: o Your blood sugar will be checked by the staff and you will probably be given insulin after surgery (instead of oral diabetes medicines) to make sure you have good blood sugar levels. o The goal for blood sugar control after surgery is 80-180 mg/dL.   WHAT DO I DO ABOUT MY DIABETES MEDICATION?  YOU MAY TAKE YOUR METFORMIN AS USUAL THE DAY BEFORE SURGERY ON 04-19-16 DO NOT TAKE YOUR METFORMIN ON DAY OF SURGERY 04-20-16  . THE DAY  BEFORE SURGERY, take 25 UNITS     units of   GLARGINE (BASAGLAR)     insulin.       . THE MORNING OF SURGERY, take 12  units of    GLARGINE (BASAGLAR)     insulin.  Patient Signature:  Date:   Nurse Signature:  Date:   Reviewed and Endorsed by [facility] Patient Education Committee, August 2015   CLEAR LIQUID DIET   Foods Allowed                                                                      Foods Excluded  Coffee and tea, regular and decaf                             liquids that you cannot  Plain Jell-O in any flavor                                             see through such as: Fruit ices (not with fruit pulp)                                     milk, soups, orange juice  Iced Popsicles                                    All solid food Carbonated beverages, regular and diet                                    Cranberry, grape and apple juices Sports drinks like Gatorade Lightly seasoned clear broth or consume(fat free) Sugar, honey syrup  Sample Menu Breakfast                                Lunch                                     Supper Cranberry juice  Beef broth                            Chicken broth Jell-O                                     Grape juice                           Apple juice Coffee or tea                        Jell-O                                      Popsicle                                                Coffee or tea                        Coffee or tea  _____________________________________________________________________  [facility] - Preparing for Surgery Before surgery, you can play an important role.  Because skin is not sterile, your skin needs to be as free of germs as possible.  You can reduce the number of germs on your skin by washing with CHG (chlorahexidine gluconate) soap before surgery.  CHG is an antiseptic cleaner which kills germs and bonds with the skin to continue killing germs even after washing. Please DO NOT use if you have an allergy to CHG or antibacterial soaps.  If your skin becomes reddened/irritated stop using the CHG and inform your nurse when you arrive at Short Stay. Do not shave (including legs and underarms) for at least 48 hours prior to the first CHG shower.  You may shave your face/neck. Please follow these instructions carefully:  1.  Shower with CHG Soap the night before surgery and the   morning of Surgery.  2.  If you choose to wash your hair, wash your hair first as usual with your  normal  shampoo.  3.  After you shampoo, rinse your hair and body thoroughly to remove the  shampoo.                           4.  Use CHG as you would any other liquid soap.  You can apply chg directly  to the skin and wash                       Gently with a scrungie or clean washcloth.  5.  Apply the CHG Soap to your body ONLY FROM THE NECK DOWN.   Do not use on face/ open                           Wound or open sores. Avoid contact with eyes, ears mouth and genitals (private parts).                       Wash face,  Genitals (private parts) with your normal soap.             6.  Wash thoroughly, paying special attention to the area where your surgery  will be performed.  7.  Thoroughly rinse your body with warm water from the neck down.  8.  DO NOT shower/wash with your normal soap after using and rinsing off  the CHG Soap.                9.  Pat yourself dry with a clean towel.            10.  Wear clean pajamas.            11.  Place clean sheets on your bed the night of your first shower and do not  sleep with pets. Day of Surgery : Do not apply any lotions/deodorants the morning of surgery.  Please wear clean clothes to the hospital/surgery center.    Incentive Spirometer  An incentive spirometer is a tool that can help keep your lungs clear and active. This tool measures how well you are filling your lungs with each breath. Taking long deep breaths may help reverse or decrease the chance of developing breathing (pulmonary) problems (especially infection) following:  A long period of time when you are unable to move or be active. BEFORE THE PROCEDURE   If the spirometer includes an indicator to show your best effort, your nurse or respiratory therapist will set it to a desired goal.  If possible, sit up straight or lean slightly forward. Try not to slouch.  Hold the incentive  spirometer in an upright position. INSTRUCTIONS FOR USE  1. Sit on the edge of your bed if possible, or sit up as far as you can in bed or on a chair. 2. Hold the incentive spirometer in an upright position. 3. Breathe out normally. 4. Place the mouthpiece in your mouth and seal your lips tightly around it. 5. Breathe in slowly and as deeply as possible, raising the piston or the ball toward the top of the column. 6. Hold your breath for 3-5 seconds or for as long as possible. Allow the piston or ball to fall to the bottom of the column. 7. Remove the mouthpiece from your mouth and breathe out normally. 8. Rest for a few seconds and repeat Steps 1 through 7 at least 10 times every 1-2 hours when you are awake. Take your time and take a few normal breaths between deep breaths. 9. The spirometer may include an indicator to show your best effort. Use the indicator as a goal to work toward during each repetition. 10. After each set of 10 deep breaths, practice coughing to be sure your lungs are clear. If you have an incision (the cut made at the time of surgery), support your incision when coughing by placing a pillow or rolled up towels firmly against it. Once you are able to get out of bed, walk around indoors and cough well. You may stop using the incentive spirometer when instructed by your caregiver.  RISKS AND COMPLICATIONS  Take your time so you do not get dizzy or light-headed.  If you are in pain, you may need to take or ask for pain medication before doing incentive spirometry. It is harder to take a deep breath if you are having pain. AFTER USE  Rest and breathe slowly and easily.  It can be helpful to keep track of a log of your progress.  Your caregiver can provide you with a simple table to help with this. If you are using the spirometer at home, follow these instructions: SEEK MEDICAL CARE IF:   You are having difficultly using the spirometer.  You have trouble using the  spirometer as often as instructed.  Your pain medication is not giving enough relief while using the spirometer.  You develop fever of 100.5 F (38.1 C) or higher. SEEK IMMEDIATE MEDICAL CARE IF:   You cough up bloody sputum that had not been present before.  You develop fever of 102 F (38.9 C) or greater.  You develop worsening pain at or near the incision site. MAKE SURE YOU:   Understand these instructions.  Will watch your condition.  Will get help right away if you are not doing well or get worse. Document Released: 06/20/2006 Document Revised: 05/02/2011 Document Reviewed: 08/21/2006 Exit[employer] Patient Information 2014 [employer], [place].   ________________________________________________________________________  WHAT IS A BLOOD TRANSFUSION? Blood Transfusion Information  A transfusion is the replacement of blood or some of its parts. Blood is made up of multiple cells which provide different functions.  Red blood cells carry oxygen and are used for blood loss replacement.  White blood cells fight against infection.  Platelets control bleeding.  Plasma helps clot blood.  Other blood products are available for specialized needs, such as hemophilia or other clotting disorders. BEFORE THE TRANSFUSION  Who gives blood for transfusions?   Healthy volunteers who are fully evaluated to make sure their blood is safe. This is blood bank blood. Transfusion therapy is the safest it has ever been in the practice of medicine. Before blood is taken from a donor, a complete history is taken to make sure that person has no history of diseases nor engages in risky social behavior (examples are intravenous drug use or sexual activity with multiple partners). The donor's travel history is screened to minimize risk of transmitting infections, such as malaria. The donated blood is tested for signs of infectious diseases, such as HIV and hepatitis. The blood is then tested to be sure it is  compatible with you in order to minimize the chance of a transfusion reaction. If you or a relative donates blood, this is often done in anticipation of surgery and is not appropriate for emergency situations. It takes many days to process the donated blood. RISKS AND COMPLICATIONS Although transfusion therapy is very safe and saves many lives, the main dangers of transfusion include:   Getting an infectious disease.  Developing a transfusion reaction. This is an allergic reaction to something in the blood you were given. Every precaution is taken to prevent this. The decision to have a blood transfusion has been considered carefully by your caregiver before blood is given. Blood is not given unless the benefits outweigh the risks. AFTER THE TRANSFUSION  Right after receiving a blood transfusion, you will usually feel much better and more energetic. This is especially true if your red blood cells have gotten low (anemic). The transfusion raises the level of the red blood cells which carry oxygen, and this usually causes an energy increase.  The nurse administering the transfusion will monitor you carefully for complications. HOME CARE INSTRUCTIONS  No special instructions are needed after a transfusion. You may find your energy is better. Speak with your caregiver about any limitations on activity for underlying diseases you may have. SEEK MEDICAL CARE IF:   Your condition is not improving after your transfusion.  You develop redness or irritation  at the intravenous (IV) site. SEEK IMMEDIATE MEDICAL CARE IF:  Any of the following symptoms occur over the next 12 hours:  Shaking chills.  You have a temperature by mouth above 102 F (38.9 C), not controlled by medicine.  Chest, back, or muscle pain.  People around you feel you are not acting correctly or are confused.  Shortness of breath or difficulty breathing.  Dizziness and fainting.  You get a rash or develop hives.  You have  a decrease in urine output.  Your urine turns a dark color or changes to pink, red, or brown. Any of the following symptoms occur over the next 10 days:  You have a temperature by mouth above 102 F (38.9 C), not controlled by medicine.  Shortness of breath.  Weakness after normal activity.  The white part of the eye turns yellow (jaundice).  You have a decrease in the amount of urine or are urinating less often.  Your urine turns a dark color or changes to pink, red, or brown. Document Released: 02/05/2000 Document Revised: 05/02/2011 Document Reviewed: 09/24/2007 [facility] Patient Information 2014 [place], [place].  _______________________________________________________________________

## 2016-04-13 ENCOUNTER — Encounter (HOSPITAL_COMMUNITY)
Admission: RE | Admit: 2016-04-13 | Discharge: 2016-04-13 | Disposition: A | Discharge status: Home / Self Care | Payer: BLUE CROSS/BLUE SHIELD | Source: Ambulatory Visit | Attending: Orthopedic Surgery | Admitting: Orthopedic Surgery

## 2016-04-13 NOTE — Progress Notes (Signed)
EKG 04-07-16 DR TAMMY BOYD ON CHART SURGICAL CLEARANCE/LOV DR TAMMY BOYD 04-07-16 ON CHART

## 2016-04-13 NOTE — Progress Notes (Signed)
LOV-04/08/16- Dr Jacinto HalimGanji on chart  EKG- 04/08/16- Dr Jacinto HalimGanji on chart  Stress Test- 04/11/16- Dr Jacinto HalimGanji on chart

## 2016-04-15 ENCOUNTER — Ambulatory Visit (HOSPITAL_COMMUNITY)
Admission: RE | Admit: 2016-04-15 | Discharge: 2016-04-15 | Disposition: A | Discharge status: Home / Self Care | Payer: BLUE CROSS/BLUE SHIELD | Source: Ambulatory Visit | Attending: Surgical | Admitting: Surgical

## 2016-04-15 ENCOUNTER — Encounter (HOSPITAL_COMMUNITY)
Admission: RE | Admit: 2016-04-15 | Discharge: 2016-04-15 | Disposition: A | Discharge status: Home / Self Care | Payer: BLUE CROSS/BLUE SHIELD | Source: Ambulatory Visit | Attending: Orthopedic Surgery | Admitting: Orthopedic Surgery

## 2016-04-15 ENCOUNTER — Encounter (HOSPITAL_COMMUNITY): Payer: Self-pay

## 2016-04-15 DIAGNOSIS — I708 Atherosclerosis of other arteries: Secondary | ICD-10-CM | POA: Insufficient documentation

## 2016-04-15 DIAGNOSIS — Z01818 Encounter for other preprocedural examination: Secondary | ICD-10-CM | POA: Insufficient documentation

## 2016-04-15 DIAGNOSIS — Z01812 Encounter for preprocedural laboratory examination: Secondary | ICD-10-CM | POA: Diagnosis not present

## 2016-04-15 DIAGNOSIS — M545 Low back pain, unspecified: Secondary | ICD-10-CM

## 2016-04-15 DIAGNOSIS — M5127 Other intervertebral disc displacement, lumbosacral region: Secondary | ICD-10-CM | POA: Insufficient documentation

## 2016-04-15 DIAGNOSIS — Z0183 Encounter for blood typing: Secondary | ICD-10-CM | POA: Insufficient documentation

## 2016-04-15 DIAGNOSIS — M47814 Spondylosis without myelopathy or radiculopathy, thoracic region: Secondary | ICD-10-CM | POA: Diagnosis not present

## 2016-04-15 HISTORY — DX: Unspecified osteoarthritis, unspecified site: M19.90

## 2016-04-15 LAB — COMPREHENSIVE METABOLIC PANEL
ALT: 25 U/L (ref 14–54)
AST: 17 U/L (ref 15–41)
Albumin: 4.6 g/dL (ref 3.5–5.0)
Alkaline Phosphatase: 245 U/L — ABNORMAL HIGH (ref 38–126)
Anion gap: 8 (ref 5–15)
BUN: 11 mg/dL (ref 6–20)
CO2: 25 mmol/L (ref 22–32)
Calcium: 9.8 mg/dL (ref 8.9–10.3)
Chloride: 102 mmol/L (ref 101–111)
Creatinine, Ser: 0.67 mg/dL (ref 0.44–1.00)
GFR calc Af Amer: >60 mL/min (ref >60)
GFR calc non Af Amer: >60 mL/min (ref >60)
Glucose, Bld: 227 mg/dL — ABNORMAL HIGH (ref 65–99)
Potassium: 3.9 mmol/L (ref 3.5–5.1)
Sodium: 135 mmol/L (ref 135–145)
Total Bilirubin: 0.9 mg/dL (ref 0.3–1.2)
Total Protein: 8.1 g/dL (ref 6.5–8.1)

## 2016-04-15 LAB — CBC WITH DIFFERENTIAL/PLATELET
Basophils Absolute: 0 10*3/uL (ref 0.0–0.1)
Basophils Relative: 1 %
Eosinophils Absolute: 0.1 10*3/uL (ref 0.0–0.7)
Eosinophils Relative: 1 %
HCT: 36.6 % (ref 36.0–46.0)
Hemoglobin: 12.3 g/dL (ref 12.0–15.0)
Lymphocytes Relative: 33 %
Lymphs Abs: 1.9 10*3/uL (ref 0.7–4.0)
MCH: 26.3 pg (ref 26.0–34.0)
MCHC: 33.6 g/dL (ref 30.0–36.0)
MCV: 78.4 fL (ref 78.0–100.0)
Monocytes Absolute: 0.4 10*3/uL (ref 0.1–1.0)
Monocytes Relative: 8 %
Neutro Abs: 3.4 10*3/uL (ref 1.7–7.7)
Neutrophils Relative %: 57 %
Platelets: 286 10*3/uL (ref 150–400)
RBC: 4.67 MIL/uL (ref 3.87–5.11)
RDW: 13.5 % (ref 11.5–15.5)
WBC: 5.9 10*3/uL (ref 4.0–10.5)

## 2016-04-15 LAB — URINALYSIS, ROUTINE W REFLEX MICROSCOPIC
Bilirubin Urine: NEGATIVE
Glucose, UA: 150 mg/dL — AB
Ketones, ur: NEGATIVE mg/dL
Nitrite: POSITIVE — AB
Protein, ur: 100 mg/dL — AB
Specific Gravity, Urine: 1.029 (ref 1.005–1.030)
pH: 5 (ref 5.0–8.0)

## 2016-04-15 LAB — PROTIME-INR
INR: 1.03
Prothrombin Time: 13.6 seconds (ref 11.4–15.2)

## 2016-04-15 LAB — APTT: aPTT: 29 seconds (ref 24–36)

## 2016-04-15 LAB — HCG, SERUM, QUALITATIVE: PREG SERUM: NEGATIVE

## 2016-04-15 LAB — ABO/RH: ABO/RH(D): O POS

## 2016-04-15 LAB — GLUCOSE, CAPILLARY: GLUCOSE-CAPILLARY: 223 mg/dL — AB (ref 65–99)

## 2016-04-15 LAB — SURGICAL PCR SCREEN
MRSA, PCR: NEGATIVE
STAPHYLOCOCCUS AUREUS: NEGATIVE

## 2016-04-15 NOTE — Progress Notes (Signed)
CMp and U/A results  done 04/15/16 faxed via epic to dr Darrelyn HillockGioffre.

## 2016-04-15 NOTE — Patient Instructions (Addendum)
Vickie Little  04/15/2016   Your procedure is scheduled on:   Report to [facility] Main  Entrance take [facility]  elevators to 3rd floor to  Short Stay Center at AM.  Call this number if you have problems the morning of surgery [phone]   Remember: ONLY 1 PERSON MAY GO WITH YOU TO SHORT STAY TO GET  READY MORNING OF YOUR SURGERY.  Do not eat food or drink liquids :After Midnight.     Take these medicines the morning of surgery with A SIP OF WATER:  DO NOT TAKE ANY DIABETIC MEDICATIONS DAY OF YOUR SURGERY                               You may not have any metal on your body including hair pins and              piercings  Do not wear jewelry, make-up, lotions, powders or perfumes, deodorant             Do not wear nail polish.  Do not shave  48 hours prior to surgery.              Men may shave face and neck.   Do not bring valuables to the hospital. [place] IS NOT             RESPONSIBLE   FOR VALUABLES.  Contacts, dentures or bridgework may not be worn into surgery.  Leave suitcase in the car. After surgery it may be brought to your room.     Patients discharged the day of surgery will not be allowed to drive home.  Name and phone number of your driver:  Special Instructions: N/A              Please read over the following fact sheets you were given: _____________________________________________________________________             [facility] - Preparing for Surgery Before surgery, you can play an important role.  Because skin is not sterile, your skin needs to be as free of germs as possible.  You can reduce the number of germs on your skin by washing with CHG (chlorahexidine gluconate) soap before surgery.  CHG is an antiseptic cleaner which kills germs and bonds with the skin to continue killing germs even after washing. Please DO NOT use if you have an allergy to CHG or antibacterial soaps.  If your skin becomes reddened/irritated stop using  the CHG and inform your nurse when you arrive at Short Stay. Do not shave (including legs and underarms) for at least 48 hours prior to the first CHG shower.  You may shave your face/neck. Please follow these instructions carefully:  1.  Shower with CHG Soap the night before surgery and the  morning of Surgery.  2.  If you choose to wash your hair, wash your hair first as usual with your  normal  shampoo.  3.  After you shampoo, rinse your hair and body thoroughly to remove the  shampoo.                           4.  Use CHG as you would any other liquid soap.  You can apply chg directly  to the skin and wash  Gently with a scrungie or clean washcloth.  5.  Apply the CHG Soap to your body ONLY FROM THE NECK DOWN.   Do not use on face/ open                           Wound or open sores. Avoid contact with eyes, ears mouth and genitals (private parts).                       Wash face,  Genitals (private parts) with your normal soap.             6.  Wash thoroughly, paying special attention to the area where your surgery  will be performed.  7.  Thoroughly rinse your body with warm water from the neck down.  8.  DO NOT shower/wash with your normal soap after using and rinsing off  the CHG Soap.                9.  Pat yourself dry with a clean towel.            10.  Wear clean pajamas.            11.  Place clean sheets on your bed the night of your first shower and do not  sleep with pets. Day of Surgery : Do not apply any lotions/deodorants the morning of surgery.  Please wear clean clothes to the hospital/surgery center.  FAILURE TO FOLLOW THESE INSTRUCTIONS MAY RESULT IN THE CANCELLATION OF YOUR SURGERY PATIENT SIGNATURE_________________________________  NURSE SIGNATURE__________________________________  ________________________________________________________________________  WHAT IS A BLOOD TRANSFUSION? Blood Transfusion Information  A transfusion is the replacement  of blood or some of its parts. Blood is made up of multiple cells which provide different functions.  Red blood cells carry oxygen and are used for blood loss replacement.  White blood cells fight against infection.  Platelets control bleeding.  Plasma helps clot blood.  Other blood products are available for specialized needs, such as hemophilia or other clotting disorders. BEFORE THE TRANSFUSION  Who gives blood for transfusions?   Healthy volunteers who are fully evaluated to make sure their blood is safe. This is blood bank blood. Transfusion therapy is the safest it has ever been in the practice of medicine. Before blood is taken from a donor, a complete history is taken to make sure that person has no history of diseases nor engages in risky social behavior (examples are intravenous drug use or sexual activity with multiple partners). The donor's travel history is screened to minimize risk of transmitting infections, such as malaria. The donated blood is tested for signs of infectious diseases, such as HIV and hepatitis. The blood is then tested to be sure it is compatible with you in order to minimize the chance of a transfusion reaction. If you or a relative donates blood, this is often done in anticipation of surgery and is not appropriate for emergency situations. It takes many days to process the donated blood. RISKS AND COMPLICATIONS Although transfusion therapy is very safe and saves many lives, the main dangers of transfusion include:   Getting an infectious disease.  Developing a transfusion reaction. This is an allergic reaction to something in the blood you were given. Every precaution is taken to prevent this. The decision to have a blood transfusion has been considered carefully by your caregiver before blood is given. Blood is not given unless the benefits outweigh  the risks. AFTER THE TRANSFUSION  Right after receiving a blood transfusion, you will usually feel much  better and more energetic. This is especially true if your red blood cells have gotten low (anemic). The transfusion raises the level of the red blood cells which carry oxygen, and this usually causes an energy increase.  The nurse administering the transfusion will monitor you carefully for complications. HOME CARE INSTRUCTIONS  No special instructions are needed after a transfusion. You may find your energy is better. Speak with your caregiver about any limitations on activity for underlying diseases you may have. SEEK MEDICAL CARE IF:   Your condition is not improving after your transfusion.  You develop redness or irritation at the intravenous (IV) site. SEEK IMMEDIATE MEDICAL CARE IF:  Any of the following symptoms occur over the next 12 hours:  Shaking chills.  You have a temperature by mouth above 102 F (38.9 C), not controlled by medicine.  Chest, back, or muscle pain.  People around you feel you are not acting correctly or are confused.  Shortness of breath or difficulty breathing.  Dizziness and fainting.  You get a rash or develop hives.  You have a decrease in urine output.  Your urine turns a dark color or changes to pink, red, or brown. Any of the following symptoms occur over the next 10 days:  You have a temperature by mouth above 102 F (38.9 C), not controlled by medicine.  Shortness of breath.  Weakness after normal activity.  The white part of the eye turns yellow (jaundice).  You have a decrease in the amount of urine or are urinating less often.  Your urine turns a dark color or changes to pink, red, or brown. Document Released: 02/05/2000 Document Revised: 05/02/2011 Document Reviewed: 09/24/2007 ExitCare Patient Information 2014 [place].  _______________________________________________________________________  Incentive Spirometer  An incentive spirometer is a tool that can help keep your lungs clear and active. This tool measures  how well you are filling your lungs with each breath. Taking long deep breaths may help reverse or decrease the chance of developing breathing (pulmonary) problems (especially infection) following:  A long period of time when you are unable to move or be active. BEFORE THE PROCEDURE   If the spirometer includes an indicator to show your best effort, your nurse or respiratory therapist will set it to a desired goal.  If possible, sit up straight or lean slightly forward. Try not to slouch.  Hold the incentive spirometer in an upright position. INSTRUCTIONS FOR USE  1. Sit on the edge of your bed if possible, or sit up as far as you can in bed or on a chair. 2. Hold the incentive spirometer in an upright position. 3. Breathe out normally. 4. Place the mouthpiece in your mouth and seal your lips tightly around it. 5. Breathe in slowly and as deeply as possible, raising the piston or the ball toward the top of the column. 6. Hold your breath for 3-5 seconds or for as long as possible. Allow the piston or ball to fall to the bottom of the column. 7. Remove the mouthpiece from your mouth and breathe out normally. 8. Rest for a few seconds and repeat Steps 1 through 7 at least 10 times every 1-2 hours when you are awake. Take your time and take a few normal breaths between deep breaths. 9. The spirometer may include an indicator to show your best effort. Use the indicator as a goal to work  toward during each repetition. 10. After each set of 10 deep breaths, practice coughing to be sure your lungs are clear. If you have an incision (the cut made at the time of surgery), support your incision when coughing by placing a pillow or rolled up towels firmly against it. Once you are able to get out of bed, walk around indoors and cough well. You may stop using the incentive spirometer when instructed by your caregiver.  RISKS AND COMPLICATIONS  Take your time so you do not get dizzy or light-headed.  If  you are in pain, you may need to take or ask for pain medication before doing incentive spirometry. It is harder to take a deep breath if you are having pain. AFTER USE  Rest and breathe slowly and easily.  It can be helpful to keep track of a log of your progress. Your caregiver can provide you with a simple table to help with this. If you are using the spirometer at home, follow these instructions: SEEK MEDICAL CARE IF:   You are having difficultly using the spirometer.  You have trouble using the spirometer as often as instructed.  Your pain medication is not giving enough relief while using the spirometer.  You develop fever of 100.5 F (38.1 C) or higher. SEEK IMMEDIATE MEDICAL CARE IF:   You cough up bloody sputum that had not been present before.  You develop fever of 102 F (38.9 C) or greater.  You develop worsening pain at or near the incision site. MAKE SURE YOU:   Understand these instructions.  Will watch your condition.  Will get help right away if you are not doing well or get worse. Document Released: 06/20/2006 Document Revised: 05/02/2011 Document Reviewed: 08/21/2006 ExitCare Patient Information 2014 [employer], [place].   ________________________________________________________________________    CLEAR LIQUID DIET   Foods Allowed                                                                     Foods Excluded  Coffee and tea, regular and decaf                             liquids that you cannot  Plain Jell-O in any flavor                                             see through such as: Fruit ices (not with fruit pulp)                                     milk, soups, orange juice  Iced Popsicles                                    All solid food Carbonated beverages, regular and diet  Cranberry, grape and apple juices Sports drinks like Gatorade Lightly seasoned clear broth or consume(fat free) Sugar, honey  syrup  Sample Menu Breakfast                                Lunch                                     Supper Cranberry juice                    Beef broth                            Chicken broth Jell-O                                     Grape juice                           Apple juice Coffee or tea                        Jell-O                                      Popsicle                                                Coffee or tea                        Coffee or tea  _____________________________________________________________________

## 2016-04-16 LAB — HEMOGLOBIN A1C
Hgb A1c MFr Bld: 10.5 % — ABNORMAL HIGH (ref 4.8–5.6)
Mean Plasma Glucose: 255 mg/dL

## 2016-04-18 NOTE — Progress Notes (Signed)
HGBA1C done 04/15/16- faxed via epic to Dr Darrelyn Hillockgioffre.

## 2016-04-19 NOTE — H&P (Signed)
Vickie Little is an 51 y.o. female.   Chief Complaint: low back pain HPI: The patient presented with the chief complaint of pain in her back radiating into the left buttocks down her left lower extremity. It has been present for about three months. She did lift a heavy child at one time that may have caused it, but she was not sure. MRI showed a disc herniation at L5-S1 on the left.   Past Medical History:  Diagnosis Date  . Arthritis   . Diabetes mellitus without complication (HCC)    type II   . Heart attack   . Hypertension     Past Surgical History:  Procedure Laterality Date  . CORONARY ANGIOPLASTY WITH STENT PLACEMENT    . LEFT HEART CATHETERIZATION WITH CORONARY ANGIOGRAM N/A 02/13/2012   Procedure: LEFT HEART CATHETERIZATION WITH CORONARY ANGIOGRAM;  Surgeon: Donato Schultz, MD;  Location: [facility] CATH LAB;  Service: Cardiovascular;  Laterality: N/A;   Current Outpatient Prescriptions:  .  acetaminophen (TYLENOL) 500 MG tablet, Take 1,000 mg by mouth every 8 (eight) hours as needed for moderate pain., Disp: , Rfl:  .  amLODipine (NORVASC) 10 MG tablet, Take 10 mg by mouth daily. , Disp: , Rfl: 0 .  aspirin 81 MG chewable tablet, Chew 81 mg by mouth every morning., Disp: , Rfl:  .  atorvastatin (LIPITOR) 10 MG tablet, Take 10 mg by mouth daily at 6 PM., Disp: , Rfl:  .  gabapentin (NEURONTIN) 300 MG capsule, Take 300-600 mg by mouth See admin instructions. 1-2 tablets depending on pain twice daily, Disp: , Rfl: 1 .  HYDROcodone-acetaminophen (NORCO/VICODIN) 5-325 MG tablet, Take 1 tablet by mouth every 4 (four) hours as needed., Disp: , Rfl: 0 .  Insulin Glargine (BASAGLAR KWIKPEN) 100 UNIT/ML SOPN, Inject 25 Units into the skin daily. Patient takes in am, Disp: , Rfl:  .  metFORMIN (GLUCOPHAGE) 500 MG tablet, Take 1,000 mg by mouth 2 (two) times daily with a meal. , Disp: , Rfl:  .  Multiple Vitamin (MULTIVITAMIN WITH MINERALS) TABS tablet, Take 1 tablet by mouth daily. Hasn't had in  a week, Disp: , Rfl:  .  ibuprofen (ADVIL,MOTRIN) 600 MG tablet, Take 1 tablet (600 mg total) by mouth every 8 (eight) hours. (Patient not taking: Reported on 04/07/2016), Disp: 30 tablet, Rfl: 0   Family History  Problem Relation Age of Onset  . Adopted: Yes  . Diabetes Father    Social History:  reports that she quit smoking about 7 months ago. She smoked 0.00 packs per day for 20.00 years. She has never used smokeless tobacco. She reports that she drinks alcohol. She reports that she does not use drugs.  Allergies: No Known Allergies   Review of Systems  Constitutional: Negative.   HENT: Negative.   Eyes: Negative.   Respiratory: Negative.   Cardiovascular: Positive for leg swelling. Negative for chest pain, palpitations, orthopnea, claudication and PND.  Gastrointestinal: Negative.   Genitourinary: Negative.   Musculoskeletal: Positive for back pain and myalgias. Negative for falls, joint pain and neck pain.  Skin: Negative.   Neurological: Negative.   Endo/Heme/Allergies: Negative.   Psychiatric/Behavioral: Negative.    Vitals Weight: 191.8 lb Height: 67in Body Surface Area: 1.99 m Body Mass Index: 30.04 kg/m  BP: 158/94 (Sitting, Left Arm, Standard) HR: 80 bpm  Last menstrual period 03/25/2016. Physical Exam  Constitutional: She is oriented to person, place, and time. She appears well-developed. No distress.  Overweight  HENT:  Head:  Normocephalic and atraumatic.  Right Ear: External ear normal.  Left Ear: External ear normal.  Nose: Nose normal.  Mouth/Throat: Oropharynx is clear and moist.  Eyes: Conjunctivae and EOM are normal.  Neck: Normal range of motion. Neck supple.  Cardiovascular: Normal rate, regular rhythm, normal heart sounds and intact distal pulses.   No murmur heard. Respiratory: Effort normal and breath sounds normal. No respiratory distress. She has no wheezes.  GI: Soft. Bowel sounds are normal. She exhibits no distension. There is no  tenderness.  Musculoskeletal:       Right hip: Normal.       Left hip: Normal.  Range of motion of her back, hyperextension and flexion reproduces her pain in the left buttocks down her left leg. There is a positive straight leg raising on the left. Her motor exam, she is extremely weak on the left side in regards to the dorsiflexors of her left foot and toe extensors of the left foot.  Neurological: She is alert and oriented to person, place, and time. No sensory deficit.  Skin: No rash noted. She is not diaphoretic. No erythema.  Psychiatric: She has a normal mood and affect. Her behavior is normal.     Assessment/Plan Lumbar disc herniation L5-S1 left She needs a lumbar hemilaminectomy and microdiscectomy L5-S1 on the left. Risks and benefits of the procedure were discussed with the patient by Dr. Darrelyn HillockGioffre.  Vickie Little LAUREN, PA-C 04/19/2016, 1:48 PM

## 2016-04-20 ENCOUNTER — Ambulatory Visit (HOSPITAL_COMMUNITY): Payer: BLUE CROSS/BLUE SHIELD

## 2016-04-20 ENCOUNTER — Encounter (HOSPITAL_COMMUNITY)
Admission: RE | Disposition: A | Discharge status: Home / Self Care | Payer: Self-pay | Source: Ambulatory Visit | Attending: Orthopedic Surgery

## 2016-04-20 ENCOUNTER — Ambulatory Visit (HOSPITAL_COMMUNITY): Payer: BLUE CROSS/BLUE SHIELD | Admitting: Anesthesiology

## 2016-04-20 ENCOUNTER — Observation Stay (HOSPITAL_COMMUNITY)
Admission: RE | Admit: 2016-04-20 | Discharge: 2016-04-21 | Disposition: A | Discharge status: Home / Self Care | Payer: BLUE CROSS/BLUE SHIELD | Source: Ambulatory Visit | Attending: Orthopedic Surgery | Admitting: Orthopedic Surgery

## 2016-04-20 ENCOUNTER — Encounter (HOSPITAL_COMMUNITY): Payer: Self-pay | Admitting: *Deleted

## 2016-04-20 DIAGNOSIS — M4807 Spinal stenosis, lumbosacral region: Secondary | ICD-10-CM | POA: Diagnosis not present

## 2016-04-20 DIAGNOSIS — Z87891 Personal history of nicotine dependence: Secondary | ICD-10-CM | POA: Insufficient documentation

## 2016-04-20 DIAGNOSIS — E119 Type 2 diabetes mellitus without complications: Secondary | ICD-10-CM | POA: Insufficient documentation

## 2016-04-20 DIAGNOSIS — Z955 Presence of coronary angioplasty implant and graft: Secondary | ICD-10-CM | POA: Diagnosis not present

## 2016-04-20 DIAGNOSIS — M48062 Spinal stenosis, lumbar region with neurogenic claudication: Secondary | ICD-10-CM | POA: Diagnosis present

## 2016-04-20 DIAGNOSIS — I10 Essential (primary) hypertension: Secondary | ICD-10-CM | POA: Diagnosis not present

## 2016-04-20 DIAGNOSIS — Z419 Encounter for procedure for purposes other than remedying health state, unspecified: Secondary | ICD-10-CM

## 2016-04-20 DIAGNOSIS — Z7982 Long term (current) use of aspirin: Secondary | ICD-10-CM | POA: Diagnosis not present

## 2016-04-20 DIAGNOSIS — Z79899 Other long term (current) drug therapy: Secondary | ICD-10-CM | POA: Insufficient documentation

## 2016-04-20 DIAGNOSIS — M5127 Other intervertebral disc displacement, lumbosacral region: Secondary | ICD-10-CM | POA: Diagnosis not present

## 2016-04-20 DIAGNOSIS — Z794 Long term (current) use of insulin: Secondary | ICD-10-CM | POA: Diagnosis not present

## 2016-04-20 DIAGNOSIS — M1990 Unspecified osteoarthritis, unspecified site: Secondary | ICD-10-CM | POA: Diagnosis not present

## 2016-04-20 DIAGNOSIS — I252 Old myocardial infarction: Secondary | ICD-10-CM | POA: Insufficient documentation

## 2016-04-20 DIAGNOSIS — Z791 Long term (current) use of non-steroidal anti-inflammatories (NSAID): Secondary | ICD-10-CM | POA: Diagnosis not present

## 2016-04-20 HISTORY — PX: LUMBAR LAMINECTOMY/DECOMPRESSION MICRODISCECTOMY: SHX5026

## 2016-04-20 LAB — TYPE AND SCREEN
ABO/RH(D): O POS
Antibody Screen: NEGATIVE

## 2016-04-20 LAB — GLUCOSE, CAPILLARY
GLUCOSE-CAPILLARY: 180 mg/dL — AB (ref 65–99)
GLUCOSE-CAPILLARY: 312 mg/dL — AB (ref 65–99)
Glucose-Capillary: 203 mg/dL — ABNORMAL HIGH (ref 65–99)

## 2016-04-20 SURGERY — LUMBAR LAMINECTOMY/DECOMPRESSION MICRODISCECTOMY
Anesthesia: General | Site: Back | Laterality: Left

## 2016-04-20 MED ORDER — MIDAZOLAM HCL 5 MG/5ML IJ SOLN
INTRAMUSCULAR | Status: DC | PRN
  Administered 2016-04-20: 2 mg via INTRAVENOUS

## 2016-04-20 MED ORDER — AMLODIPINE BESYLATE 10 MG PO TABS
10.0000 mg | ORAL_TABLET | Freq: Every day | ORAL | Status: DC
  Administered 2016-04-21: 10:00:00 10 mg via ORAL
  Filled 2016-04-20: qty 1

## 2016-04-20 MED ORDER — SUFENTANIL CITRATE 50 MCG/ML IV SOLN
INTRAVENOUS | Status: DC | PRN
  Administered 2016-04-20 (×4): 5 ug via INTRAVENOUS
  Administered 2016-04-20: 10 ug via INTRAVENOUS
  Administered 2016-04-20: 5 ug via INTRAVENOUS

## 2016-04-20 MED ORDER — OXYCODONE-ACETAMINOPHEN 5-325 MG PO TABS: 1.0000 | ORAL_TABLET | ORAL | 0 refills | Status: DC | PRN

## 2016-04-20 MED ORDER — METHOCARBAMOL 500 MG PO TABS
500.0000 mg | ORAL_TABLET | Freq: Four times a day (QID) | ORAL | Status: DC | PRN
  Administered 2016-04-20: 500 mg via ORAL
  Filled 2016-04-20: qty 1

## 2016-04-20 MED ORDER — HYDROMORPHONE HCL 1 MG/ML IJ SOLN
INTRAMUSCULAR | Status: AC
  Filled 2016-04-20: qty 1

## 2016-04-20 MED ORDER — BUPIVACAINE LIPOSOME 1.3 % IJ SUSP
20.0000 mL | Freq: Once | INTRAMUSCULAR | Status: DC
  Filled 2016-04-20: qty 20

## 2016-04-20 MED ORDER — DEXAMETHASONE SODIUM PHOSPHATE 10 MG/ML IJ SOLN
INTRAMUSCULAR | Status: DC | PRN
  Administered 2016-04-20: 6 mg via INTRAVENOUS

## 2016-04-20 MED ORDER — SODIUM CHLORIDE 0.9 % IR SOLN
Status: AC
  Filled 2016-04-20: qty 500000

## 2016-04-20 MED ORDER — CEFAZOLIN SODIUM-DEXTROSE 2-4 GM/100ML-% IV SOLN
2.0000 g | INTRAVENOUS | Status: AC
  Administered 2016-04-20: 2 g via INTRAVENOUS
  Filled 2016-04-20: qty 100

## 2016-04-20 MED ORDER — DEXAMETHASONE SODIUM PHOSPHATE 10 MG/ML IJ SOLN
INTRAMUSCULAR | Status: AC
Start: 2016-04-20 — End: 2016-04-20
  Filled 2016-04-20: qty 1

## 2016-04-20 MED ORDER — SODIUM CHLORIDE 0.9 % IJ SOLN
INTRAMUSCULAR | Status: AC
  Filled 2016-04-20: qty 20

## 2016-04-20 MED ORDER — ONDANSETRON HCL 4 MG/2ML IJ SOLN: 4.0000 mg | INTRAMUSCULAR | Status: DC | PRN

## 2016-04-20 MED ORDER — PHENYLEPHRINE 40 MCG/ML (10ML) SYRINGE FOR IV PUSH (FOR BLOOD PRESSURE SUPPORT)
PREFILLED_SYRINGE | INTRAVENOUS | Status: DC | PRN
  Administered 2016-04-20 (×5): 80 ug via INTRAVENOUS

## 2016-04-20 MED ORDER — BACITRACIN-NEOMYCIN-POLYMYXIN 400-5-5000 EX OINT
TOPICAL_OINTMENT | CUTANEOUS | Status: DC | PRN
  Administered 2016-04-20: 1 via TOPICAL

## 2016-04-20 MED ORDER — MENTHOL 3 MG MT LOZG: 1.0000 | LOZENGE | OROMUCOSAL | Status: DC | PRN

## 2016-04-20 MED ORDER — ROCURONIUM BROMIDE 50 MG/5ML IV SOSY
PREFILLED_SYRINGE | INTRAVENOUS | Status: AC
  Filled 2016-04-20: qty 5

## 2016-04-20 MED ORDER — ACETAMINOPHEN 325 MG PO TABS: 650.0000 mg | ORAL_TABLET | Freq: Four times a day (QID) | ORAL | Status: DC | PRN

## 2016-04-20 MED ORDER — BACITRACIN-NEOMYCIN-POLYMYXIN 400-5-5000 EX OINT
TOPICAL_OINTMENT | CUTANEOUS | Status: AC
  Filled 2016-04-20: qty 1

## 2016-04-20 MED ORDER — POLYETHYLENE GLYCOL 3350 17 G PO PACK: 17.0000 g | PACK | Freq: Every day | ORAL | Status: DC | PRN

## 2016-04-20 MED ORDER — PHENOL 1.4 % MT LIQD: 1.0000 | OROMUCOSAL | Status: DC | PRN

## 2016-04-20 MED ORDER — GABAPENTIN 300 MG PO CAPS
300.0000 mg | ORAL_CAPSULE | Freq: Three times a day (TID) | ORAL | Status: DC
  Administered 2016-04-21: 10:00:00 300 mg via ORAL
  Filled 2016-04-20 (×2): qty 1

## 2016-04-20 MED ORDER — BUPIVACAINE-EPINEPHRINE 0.5% -1:200000 IJ SOLN
INTRAMUSCULAR | Status: DC | PRN
  Administered 2016-04-20: 20 mL

## 2016-04-20 MED ORDER — FLEET ENEMA 7-19 GM/118ML RE ENEM: 1.0000 | ENEMA | Freq: Once | RECTAL | Status: DC | PRN

## 2016-04-20 MED ORDER — PHENYLEPHRINE HCL 10 MG/ML IJ SOLN
INTRAVENOUS | Status: DC | PRN
  Administered 2016-04-20: 40 ug/min via INTRAVENOUS

## 2016-04-20 MED ORDER — GABAPENTIN 300 MG PO CAPS: 300.0000 mg | ORAL_CAPSULE | ORAL | Status: DC

## 2016-04-20 MED ORDER — HYDROCODONE-ACETAMINOPHEN 5-325 MG PO TABS: 1.0000 | ORAL_TABLET | ORAL | Status: DC | PRN

## 2016-04-20 MED ORDER — MIDAZOLAM HCL 2 MG/2ML IJ SOLN
INTRAMUSCULAR | Status: AC
  Filled 2016-04-20: qty 2

## 2016-04-20 MED ORDER — ASPIRIN EC 325 MG PO TBEC: 325.0000 mg | DELAYED_RELEASE_TABLET | Freq: Every day | ORAL | 0 refills | Status: DC

## 2016-04-20 MED ORDER — ONDANSETRON HCL 4 MG/2ML IJ SOLN
INTRAMUSCULAR | Status: DC | PRN
  Administered 2016-04-20: 4 mg via INTRAVENOUS

## 2016-04-20 MED ORDER — LACTATED RINGERS IV SOLN
INTRAVENOUS | Status: DC
  Administered 2016-04-20: 18:00:00 100 mL/h via INTRAVENOUS

## 2016-04-20 MED ORDER — CEFAZOLIN IN D5W 1 GM/50ML IV SOLN
1.0000 g | Freq: Three times a day (TID) | INTRAVENOUS | Status: DC
  Administered 2016-04-20 – 2016-04-21 (×2): 1 g via INTRAVENOUS
  Filled 2016-04-20 (×4): qty 50

## 2016-04-20 MED ORDER — INSULIN ASPART 100 UNIT/ML ~~LOC~~ SOLN
0.0000 [IU] | Freq: Three times a day (TID) | SUBCUTANEOUS | Status: DC
  Administered 2016-04-21: 3 [IU] via SUBCUTANEOUS

## 2016-04-20 MED ORDER — DEXTROSE 5 % IV SOLN
500.0000 mg | Freq: Four times a day (QID) | INTRAVENOUS | Status: DC | PRN
  Administered 2016-04-20: 500 mg via INTRAVENOUS
  Filled 2016-04-20 (×2): qty 5

## 2016-04-20 MED ORDER — PROPOFOL 10 MG/ML IV BOLUS
INTRAVENOUS | Status: AC
  Filled 2016-04-20: qty 20

## 2016-04-20 MED ORDER — ROCURONIUM BROMIDE 10 MG/ML (PF) SYRINGE
PREFILLED_SYRINGE | INTRAVENOUS | Status: DC | PRN
  Administered 2016-04-20 (×2): 10 mg via INTRAVENOUS
  Administered 2016-04-20: 50 mg via INTRAVENOUS
  Administered 2016-04-20: 10 mg via INTRAVENOUS

## 2016-04-20 MED ORDER — SODIUM CHLORIDE 0.9 % IR SOLN
Status: DC | PRN
  Administered 2016-04-20: 500 mL

## 2016-04-20 MED ORDER — CHLORHEXIDINE GLUCONATE 4 % EX LIQD: 60.0000 mL | Freq: Once | CUTANEOUS | Status: DC

## 2016-04-20 MED ORDER — LACTATED RINGERS IV SOLN
INTRAVENOUS | Status: DC
  Administered 2016-04-20 (×2): via INTRAVENOUS

## 2016-04-20 MED ORDER — ONDANSETRON HCL 4 MG/2ML IJ SOLN
INTRAMUSCULAR | Status: AC
  Filled 2016-04-20: qty 2

## 2016-04-20 MED ORDER — BISACODYL 5 MG PO TBEC: 5.0000 mg | DELAYED_RELEASE_TABLET | Freq: Every day | ORAL | Status: DC | PRN

## 2016-04-20 MED ORDER — BUPIVACAINE-EPINEPHRINE (PF) 0.5% -1:200000 IJ SOLN
INTRAMUSCULAR | Status: AC
  Filled 2016-04-20: qty 30

## 2016-04-20 MED ORDER — ACETAMINOPHEN 325 MG PO TABS: 650.0000 mg | ORAL_TABLET | ORAL | Status: DC | PRN

## 2016-04-20 MED ORDER — PROPOFOL 10 MG/ML IV BOLUS
INTRAVENOUS | Status: DC | PRN
  Administered 2016-04-20: 200 mg via INTRAVENOUS

## 2016-04-20 MED ORDER — METHOCARBAMOL 500 MG PO TABS: 500.0000 mg | ORAL_TABLET | Freq: Four times a day (QID) | ORAL | 1 refills | Status: DC | PRN

## 2016-04-20 MED ORDER — BUPIVACAINE-EPINEPHRINE (PF) 0.5% -1:200000 IJ SOLN
INTRAMUSCULAR | Status: AC
Start: 2016-04-20 — End: 2016-04-20
  Filled 2016-04-20: qty 1.8

## 2016-04-20 MED ORDER — HYDROMORPHONE HCL 1 MG/ML IJ SOLN: 1.0000 mg | INTRAMUSCULAR | Status: DC | PRN

## 2016-04-20 MED ORDER — SUFENTANIL CITRATE 50 MCG/ML IV SOLN
INTRAVENOUS | Status: AC
  Filled 2016-04-20: qty 1

## 2016-04-20 MED ORDER — PROMETHAZINE HCL 25 MG/ML IJ SOLN: 6.2500 mg | INTRAMUSCULAR | Status: DC | PRN

## 2016-04-20 MED ORDER — HYDROMORPHONE HCL 1 MG/ML IJ SOLN
INTRAMUSCULAR | Status: AC
  Administered 2016-04-20: 0.5 mg via INTRAVENOUS
  Filled 2016-04-20: qty 1

## 2016-04-20 MED ORDER — ACETAMINOPHEN 650 MG RE SUPP: 650.0000 mg | RECTAL | Status: DC | PRN

## 2016-04-20 MED ORDER — HYDROMORPHONE HCL 1 MG/ML IJ SOLN
0.2500 mg | INTRAMUSCULAR | Status: DC | PRN
  Administered 2016-04-20 (×2): 0.5 mg via INTRAVENOUS

## 2016-04-20 MED ORDER — ACETAMINOPHEN 650 MG RE SUPP: 650.0000 mg | Freq: Four times a day (QID) | RECTAL | Status: DC | PRN

## 2016-04-20 MED ORDER — LIDOCAINE 2% (20 MG/ML) 5 ML SYRINGE
INTRAMUSCULAR | Status: DC | PRN
  Administered 2016-04-20: 80 mg via INTRAVENOUS

## 2016-04-20 MED ORDER — SUGAMMADEX SODIUM 200 MG/2ML IV SOLN
INTRAVENOUS | Status: AC
  Filled 2016-04-20: qty 2

## 2016-04-20 MED ORDER — BUPIVACAINE LIPOSOME 1.3 % IJ SUSP
INTRAMUSCULAR | Status: DC | PRN
  Administered 2016-04-20: 20 mL

## 2016-04-20 MED ORDER — SUGAMMADEX SODIUM 200 MG/2ML IV SOLN
INTRAVENOUS | Status: DC | PRN
  Administered 2016-04-20: 170 mg via INTRAVENOUS

## 2016-04-20 MED ORDER — PHENYLEPHRINE 40 MCG/ML (10ML) SYRINGE FOR IV PUSH (FOR BLOOD PRESSURE SUPPORT)
PREFILLED_SYRINGE | INTRAVENOUS | Status: AC
  Filled 2016-04-20: qty 10

## 2016-04-20 MED ORDER — OXYCODONE-ACETAMINOPHEN 5-325 MG PO TABS
2.0000 | ORAL_TABLET | ORAL | Status: DC | PRN
  Administered 2016-04-20 – 2016-04-21 (×5): 2 via ORAL
  Filled 2016-04-20 (×5): qty 2

## 2016-04-20 SURGICAL SUPPLY — 46 items
AGENT HMST SPONGE THK3/8 (HEMOSTASIS) ×1
APL SKNCLS STERI-STRIP NONHPOA (GAUZE/BANDAGES/DRESSINGS)
BAG SPEC THK2 15X12 ZIP CLS (MISCELLANEOUS)
BAG ZIPLOCK 12X15 (MISCELLANEOUS) IMPLANT
BENZOIN TINCTURE PRP APPL 2/3 (GAUZE/BANDAGES/DRESSINGS) ×1 IMPLANT
CLEANER TIP ELECTROSURG 2X2 (MISCELLANEOUS) ×3 IMPLANT
DRAIN PENROSE 18X1/4 LTX STRL (WOUND CARE) IMPLANT
DRAPE MICROSCOPE LEICA (MISCELLANEOUS) ×3 IMPLANT
DRAPE POUCH INSTRU U-SHP 10X18 (DRAPES) ×3 IMPLANT
DRAPE SHEET LG 3/4 BI-LAMINATE (DRAPES) ×3 IMPLANT
DRAPE SURG 17X11 SM STRL (DRAPES) ×3 IMPLANT
DRSG ADAPTIC 3X8 NADH LF (GAUZE/BANDAGES/DRESSINGS) ×3 IMPLANT
DRSG PAD ABDOMINAL 8X10 ST (GAUZE/BANDAGES/DRESSINGS) ×12 IMPLANT
DURAPREP 26ML APPLICATOR (WOUND CARE) ×3 IMPLANT
ELECT BLADE TIP CTD 4 INCH (ELECTRODE) ×3 IMPLANT
ELECT REM PT RETURN 9FT ADLT (ELECTROSURGICAL) ×3
ELECTRODE REM PT RTRN 9FT ADLT (ELECTROSURGICAL) ×1 IMPLANT
GAUZE SPONGE 4X4 12PLY STRL (GAUZE/BANDAGES/DRESSINGS) ×3 IMPLANT
GLOVE BIOGEL PI IND STRL 8 (GLOVE) ×1 IMPLANT
GLOVE BIOGEL PI INDICATOR 8 (GLOVE) ×2
GLOVE ECLIPSE 8.0 STRL XLNG CF (GLOVE) ×6 IMPLANT
GOWN STRL REUS W/TWL XL LVL3 (GOWN DISPOSABLE) ×6 IMPLANT
HEMOSTAT SPONGE AVITENE ULTRA (HEMOSTASIS) ×3 IMPLANT
KIT BASIN OR (CUSTOM PROCEDURE TRAY) ×3 IMPLANT
KIT POSITIONING SURG ANDREWS (MISCELLANEOUS) ×3 IMPLANT
MANIFOLD NEPTUNE II (INSTRUMENTS) ×3 IMPLANT
MARKER SKIN DUAL TIP RULER LAB (MISCELLANEOUS) ×3 IMPLANT
NDL SPNL 18GX3.5 QUINCKE PK (NEEDLE) ×2 IMPLANT
NEEDLE HYPO 22GX1.5 SAFETY (NEEDLE) ×6 IMPLANT
NEEDLE SPNL 18GX3.5 QUINCKE PK (NEEDLE) ×6 IMPLANT
PACK LAMINECTOMY ORTHO (CUSTOM PROCEDURE TRAY) ×3 IMPLANT
PAD ABD 8X10 STRL (GAUZE/BANDAGES/DRESSINGS) ×2 IMPLANT
PATTIES SURGICAL .5 X.5 (GAUZE/BANDAGES/DRESSINGS) IMPLANT
PATTIES SURGICAL .75X.75 (GAUZE/BANDAGES/DRESSINGS) ×3 IMPLANT
PATTIES SURGICAL 1X1 (DISPOSABLE) ×3 IMPLANT
PIN SAFETY NICK PLATE  2 MED (MISCELLANEOUS)
PIN SAFETY NICK PLATE 2 MED (MISCELLANEOUS) IMPLANT
SPONGE LAP 4X18 X RAY DECT (DISPOSABLE) ×6 IMPLANT
STAPLER VISISTAT 35W (STAPLE) ×3 IMPLANT
SUT VIC AB 0 CT1 27 (SUTURE) ×3
SUT VIC AB 0 CT1 27XBRD ANTBC (SUTURE) ×1 IMPLANT
SUT VIC AB 1 CT1 27 (SUTURE) ×9
SUT VIC AB 1 CT1 27XBRD ANTBC (SUTURE) ×3 IMPLANT
SYR 20CC LL (SYRINGE) ×6 IMPLANT
TAPE CLOTH SURG 6X10 WHT LF (GAUZE/BANDAGES/DRESSINGS) ×2 IMPLANT
TOWEL OR 17X26 10 PK STRL BLUE (TOWEL DISPOSABLE) ×3 IMPLANT

## 2016-04-20 NOTE — Interval H&P Note (Signed)
History and Physical Interval Note:  04/20/2016 1:28 PM  Vickie Little  has presented today for surgery, with the diagnosis of Left L5-S1 HNP  The various methods of treatment have been discussed with the patient and family. After consideration of risks, benefits and other options for treatment, the patient has consented to  Procedure(s): Microdisectomy L5-S1 left (N/A) as a surgical intervention .  The patient's history has been reviewed, patient examined, no change in status, stable for surgery.  I have reviewed the patient's chart and labs.  Questions were answered to the patient's satisfaction.     Marica Trentham A

## 2016-04-20 NOTE — Anesthesia Postprocedure Evaluation (Addendum)
Anesthesia Post Note  Patient: Vickie Little  Procedure(s) Performed: Procedure(s) (LRB): 1. central decompression for spinal stenosis   1.central decompression for spinal stenosis L5-S1  2.foramionotomy L5 root and S1nerve root left       3. Microdisectomy L5-S1 left   (Left)  Patient location during evaluation: PACU Anesthesia Type: General Level of consciousness: awake and alert Pain management: pain level controlled Vital Signs Assessment: post-procedure vital signs reviewed and stable Respiratory status: spontaneous breathing, nonlabored ventilation, respiratory function stable and patient connected to nasal cannula oxygen Cardiovascular status: blood pressure returned to baseline and stable Postop Assessment: no signs of nausea or vomiting Anesthetic complications: no       Last Vitals:  Vitals:   04/20/16 1645 04/20/16 1700  BP: (!) 160/81 (!) 141/77  Pulse: 92 80  Resp: 19 13  Temp:  36.6 C    Last Pain:  Vitals:   04/20/16 1700  TempSrc:   PainSc: 3                  Khamani Daniely S

## 2016-04-20 NOTE — Discharge Instructions (Signed)
For the first three days, remove your dressing and tape a piece of saran wrap over your incision Take your shower, then remove the saran wrap and put a clean dressing on. After three days you can shower without the saran wrap.  No lifting or bending. No driving while taking pain medications. Call Dr. Darrelyn HillockGioffre if any wound complications or temperature of 101 degrees F or over.  Call the office for an appointment to see Dr. Darrelyn HillockGioffre in two weeks: [phone] and ask for Dr. Jeannetta EllisGioffre's nurse, Mackey Birchwoodammy Johnson.

## 2016-04-20 NOTE — Transfer of Care (Signed)
Immediate Anesthesia Transfer of Care Note  Patient: Vickie Little  Procedure(s) Performed: Procedure(s): 1. central decompression for spinal stenosis   1.central decompression for spinal stenosis L5-S1  2.foramionotomy L5 root and S1nerve root right 3. Microdisectomy L5-S1 right   (Left)  Patient Location: PACU  Anesthesia Type:General  Level of Consciousness:  sedated, patient cooperative and responds to stimulation  Airway & Oxygen Therapy:Patient Spontanous Breathing and Patient connected to face mask oxgen  Post-op Assessment:  Report given to PACU RN and Post -op Vital signs reviewed and stable  Post vital signs:  Reviewed and stable  Last Vitals:  Vitals:   04/20/16 1105  BP: (!) 160/85  Pulse: 87  Resp: 16  Temp: 36.8 C    Complications: No apparent anesthesia complications

## 2016-04-20 NOTE — Op Note (Signed)
NAMEncarnacion Slates:  PRESLEY, SHANITA             ACCOUNT NO.:  [id number]  MEDICAL RECORD NO.:  [id number]  LOCATION:                                 FACILITY:  PHYSICIAN:  Georges Lynchonald A. Leobardo Granlund, M.D.DATE OF BIRTH:  Mar 03, 1965  DATE OF PROCEDURE:  04/20/2016 DATE OF DISCHARGE:                              OPERATIVE REPORT   SURGEON:  Ranee Gosselinonald Johanthan Kneeland, M.D.  OPERATIVE ASSISTANT:  Pharmacist, communityAmber Constable P.A.  PREOPERATIVE DIAGNOSIS: 1. Spinal stenosis at L5-S1. 2. 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 3. Foraminal stenosis involving the L5 root on the left. 4. Foraminal stenosis involving the S1 root on the left.  POSTOPERATIVE DIAGNOSIS: 1. Spinal stenosis at L5-S1. 2. 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 3. Foraminal stenosis involving the L5 root on the left. 4. Foraminal stenosis involving the S1 root on the left.  OPERATION: 1. A complete decompressive lumbar laminectomy at L5-S1 for spinal     stenosis. 2. Microdiskectomy at L5-S1 on the left for herniated lumbar disc. 3. Foraminotomy for the L5 root on the left. 4. Foraminotomy for the S1 root on the left.  DESCRIPTION OF PROCEDURE:  Under general anesthesia, routine orthopedic prep and draping of the back was carried out.  The appropriate time-out was first carried out also marked the appropriate left side of the back since her pain was on the left leg only, but we went central because of the stenosis, so after this was completed, 2 needles were placed in the back for localization purposes and x-ray was taken.  At this time, an incision was made over the L5-S1 interspace.  The incision was expanded distally and proximally.  The soft tissue then was stripped from the lamina and spinous processes bilaterally.  The venous bleeders were cauterized.  At this time, a Kocher clamp was placed on the L5 spinous process and the instrument was placed in the L5-S1 space.  Following that, we then continued by  inserting our McCullough retractors.  We had good hemostasis.  I then removed the spinous process of L5 and went down and did a central decompression in usual fashion at L5-S1.  We also went up a little proximally as well until the canal was opened.  The ligamentum flavum was extremely thickened.  It took a great deal of time utilizing the microscope to remove this ligamentum flavum carefully so we did not injure the dura.  The dura was protected at all time with the cottonoids.  The microscope was used and at this particular time, we went far out laterally and decompressed the lateral recesses on the left.  I identified the S1 root by doing a nice foraminotomy distally. I did a foraminotomy proximally as well for the L5 root.  Once identified the S1 root and gently retracted it, a needle was placed in the disc space for my purposes to make sure we were in the disc space and not in the root.  We then made a cruciate incision in the posterior longitudinal ligament, utilized the nerve hook and the Epstein curette and micro pituitaries and did a microdiskectomy.  I thoroughly explored the root above her distally as well  into the foramina and it was now free.  The dura was free medially and we were free above.  We thoroughly irrigated out the area and loosely applied some thrombin-soaked Gelfoam and closed the wound layers in usual fashion.  At the beginning of the case, I injected 20 mL of 0.5% Marcaine with epinephrine and soft tissue to control bleeding.  At the end of the case, I injected 20 mL of Exparel into the soft tissue for pain relief.  The wound was closed in usual fashion, skin with metal staples, and a sterile dressing with Neosporin dressing applied.  She had 2 g of IV Ancef preop, and she was transferred to the recovery room stable.          ______________________________ Georges Lynch. Darrelyn Hillock, M.D.     RAG/MEDQ  D:  04/20/2016  T:  04/20/2016  Job:  [id number]

## 2016-04-20 NOTE — Anesthesia Preprocedure Evaluation (Signed)
Anesthesia Evaluation  Patient identified by MRN, date of birth, ID band Patient awake    Reviewed: Allergy & Precautions, NPO status , Patient's Chart, lab work & pertinent test results  Airway Mallampati: II  TM Distance: >3 FB Neck ROM: Full    Dental no notable dental hx.    Pulmonary neg pulmonary ROS, former smoker,    Pulmonary exam normal breath sounds clear to auscultation       Cardiovascular hypertension, + CAD, + Past MI and + Cardiac Stents  Normal cardiovascular exam Rhythm:Regular Rate:Normal     Neuro/Psych negative neurological ROS  negative psych ROS   GI/Hepatic negative GI ROS, Neg liver ROS,   Endo/Other  diabetes, Insulin Dependent  Renal/GU negative Renal ROS  negative genitourinary   Musculoskeletal negative musculoskeletal ROS (+)   Abdominal   Peds negative pediatric ROS (+)  Hematology negative hematology ROS (+)   Anesthesia Other Findings   Reproductive/Obstetrics negative OB ROS                             Anesthesia Physical Anesthesia Plan  ASA: III  Anesthesia Plan: General   Post-op Pain Management:    Induction: Intravenous  Airway Management Planned: Oral ETT  Additional Equipment:   Intra-op Plan:   Post-operative Plan: Extubation in OR  Informed Consent: I have reviewed the patients History and Physical, chart, labs and discussed the procedure including the risks, benefits and alternatives for the proposed anesthesia with the patient or authorized representative who has indicated his/her understanding and acceptance.   Dental advisory given  Plan Discussed with: CRNA and Surgeon  Anesthesia Plan Comments:         Anesthesia Quick Evaluation

## 2016-04-20 NOTE — Brief Op Note (Signed)
04/20/2016  4:12 PM  PATIENT:  Vickie Little  51 y.o. female  PRE-OPERATIVE DIAGNOSIS:  Left L5-S1 HNP.Spinal Stenosis at L-5-S-1,,Foraminal Stenosis involving the L-5 and S-1 Nerve roots on the Left.  POST-OPERATIVE DIAGNOSIS:  lSame as Pre-Op  PROCEDURE:  Complete decompressive Lumbar Laminectomy at L-5-S-1 for Spinal Stenosis.Microdiscectomy at L-5-S-1 for HNP. Foraminotomies for L-5 andS-1 Nerve roots on the Left for Foraminal Stenosis.  SURGEON:  Surgeon(s) and Role:    * Ranee Gosselinonald Cimone Fahey, MD - Primary  PHYSICIAN ASSISTANT: Dimitri PedAmber Constable PA  ASSISTANTS: Dimitri PedAmber Constable PA  ANESTHESIA:   general  EBL:  No intake/output data recorded.  BLOOD ADMINISTERED:none  DRAINS: none   LOCAL MEDICATIONS USED:  MARCAINE  20cc of 0.50% with Epinephrine at the start of the case and 20cc of Exparel at the end of the case.   SPECIMEN:  Source of Specimen:  L-5-S-1 interspace on the left.  DISPOSITION OF SPECIMEN:  PATHOLOGY  COUNTS:  YES  TOURNIQUET:  * No tourniquets in log *  DICTATION: .Other Dictation: Dictation Number [id number]  PLAN OF CARE: Admit for overnight observation  PATIENT DISPOSITION:  PACU - hemodynamically stable.   Delay start of Pharmacological VTE agent (>24hrs) due to surgical blood loss or risk of bleeding: yes

## 2016-04-20 NOTE — Interval H&P Note (Signed)
History and Physical Interval Note:  04/20/2016 1:26 PM  Vickie Little  has presented today for surgery, with the diagnosis of Left L5-S1 HNP  The various methods of treatment have been discussed with the patient and family. After consideration of risks, benefits and other options for treatment, the patient has consented to  Procedure(s): Microdisectomy L5-S1 left (N/A) as a surgical intervention .  The patient's history has been reviewed, patient examined, no change in status, stable for surgery.  I have reviewed the patient's chart and labs.  Questions were answered to the patient's satisfaction.     Wanda Cellucci A

## 2016-04-20 NOTE — Anesthesia Procedure Notes (Signed)
Procedure Name: Intubation Date/Time: 04/20/2016 2:19 PM Performed by: Grainger Mccarley, Virgel Gess Pre-anesthesia Checklist: Patient identified, Emergency Drugs available, Suction available, Patient being monitored and Timeout performed Patient Re-evaluated:Patient Re-evaluated prior to inductionOxygen Delivery Method: Circle system utilized Preoxygenation: Pre-oxygenation with 100% oxygen Intubation Type: IV induction Ventilation: Mask ventilation without difficulty Laryngoscope Size: Mac and 4 Grade View: Grade I Tube type: Oral Tube size: 7.5 mm Number of attempts: 1 Airway Equipment and Method: Stylet Placement Confirmation: ETT inserted through vocal cords under direct vision,  positive ETCO2,  CO2 detector and breath sounds checked- equal and bilateral Secured at: 22 cm Tube secured with: Tape Dental Injury: Teeth and Oropharynx as per pre-operative assessment

## 2016-04-21 ENCOUNTER — Encounter (HOSPITAL_COMMUNITY): Payer: Self-pay | Admitting: Orthopedic Surgery

## 2016-04-21 DIAGNOSIS — M5127 Other intervertebral disc displacement, lumbosacral region: Secondary | ICD-10-CM | POA: Diagnosis not present

## 2016-04-21 LAB — GLUCOSE, CAPILLARY
GLUCOSE-CAPILLARY: 207 mg/dL — AB (ref 65–99)
GLUCOSE-CAPILLARY: 233 mg/dL — AB (ref 65–99)

## 2016-04-21 NOTE — Evaluation (Signed)
Physical Therapy Evaluation Patient Details Name: Vickie IlesShenita M Soliday MRN: [id number] DOB: 11/24/1965 Today's Date: 04/21/2016   History of Present Illness  s/p central decompression for spinal stenosis L5-S1 2, foramionotomy L5 root & S1nerve root left,  Microdisectomy L5-S1 left   Clinical Impression  Patient evaluated by Physical Therapy with no further acute PT needs identified. All education has been completed and the patient has no further questions.  See below for any follow-up Physical Therapy or equipment needs. PT is signing off. Thank you for this referral.     Follow Up Recommendations No PT follow up    Equipment Recommendations  Rolling walker with 5" wheels    Recommendations for Other Services       Precautions / Restrictions Precautions Precautions: Back;Fall Restrictions Weight Bearing Restrictions: No      Mobility  Bed Mobility Overal bed mobility: Needs Assistance Bed Mobility: Rolling;Sidelying to Sit Rolling: Min guard Sidelying to sit: Min guard       General bed mobility comments:  (nT--to  chair with OT)  Transfers Overall transfer level: Needs assistance Equipment used: Rolling walker (2 wheeled) Transfers: Sit to/from Stand Sit to Stand: Min guard;Supervision         General transfer comment: for safety. Cues for UE placement  Ambulation/Gait Ambulation/Gait assistance: Min guard;Supervision Ambulation Distance (Feet): 150 Feet Assistive device: Rolling walker (2 wheeled) Gait Pattern/deviations: Step-through pattern;Decreased stride length     General Gait Details: minimal trunk rotation d/t pain and RW use, cues for RW safety  Stairs Stairs: Yes Stairs assistance: Min guard Stair Management: No rails;One rail Right;Forwards;Step to pattern (HHA) Number of Stairs: 2 General stair comments: cues for step to pattern for safety  Wheelchair Mobility    Modified Rankin (Stroke Patients Only)       Balance                                              Pertinent Vitals/Pain Pain Assessment: Faces Faces Pain Scale: Hurts a little bit Pain Location: back --with transitional movements Pain Descriptors / Indicators: Sore Pain Intervention(s): Limited activity within patient's tolerance;Monitored during session;Premedicated before session    Home Living Family/patient expects to be discharged to:: Private residence Living Arrangements: Spouse/significant other Available Help at Discharge: Family Type of Home: House Home Access: Stairs to enter Entrance Stairs-Rails: None Secretary/administratorntrance Stairs-Number of Steps: 2 Home Layout: One level Home Equipment: None      Prior Function Level of Independence: Independent               Hand Dominance        Extremity/Trunk Assessment   Upper Extremity Assessment Upper Extremity Assessment: Defer to OT evaluation;Overall [employer] LLCWFL for tasks assessed    Lower Extremity Assessment Lower Extremity Assessment: Overall WFL for tasks assessed       Communication   Communication: No difficulties  Cognition Arousal/Alertness: Awake/alert Behavior During Therapy: WFL for tasks assessed/performed Overall Cognitive Status: Within Functional Limits for tasks assessed                      General Comments      Exercises General Exercises - Lower Extremity Ankle Circles/Pumps: AROM;Both;10 reps   Assessment/Plan    PT Assessment Patent does not need any further PT services (possibly OPPT later per MD)  PT Problem List  PT Treatment Interventions      PT Goals (Current goals can be found in the Care Plan section)  Acute Rehab PT Goals Patient Stated Goal: return to independence PT Goal Formulation: All assessment and education complete, DC therapy    Frequency     Barriers to discharge        Co-evaluation               End of Session   Activity Tolerance: Patient tolerated treatment well Patient left: in  chair;with call bell/phone within reach;with family/visitor present Nurse Communication: Mobility status PT Visit Diagnosis: Other abnormalities of gait and mobility (R26.89);Pain Pain - part of body:  (back)    Functional Assessment Tool Used: AM-PAC 6 Clicks Basic Mobility;Clinical judgement Functional Limitation: Mobility: Walking and moving around Mobility: Walking and Moving Around Current Status (I3474): At least 1 percent but less than 20 percent impaired, limited or restricted Mobility: Walking and Moving Around Goal Status [phone]): At least 1 percent but less than 20 percent impaired, limited or restricted Mobility: Walking and Moving Around Discharge Status [phone]): At least 1 percent but less than 20 percent impaired, limited or restricted    Time: 1150-1205 PT Time Calculation (min) (ACUTE ONLY): 15 min   Charges:   PT Evaluation $PT Eval Low Complexity: 1 Procedure     PT G Codes:   PT G-Codes **NOT FOR INPATIENT CLASS** Functional Assessment Tool Used: AM-PAC 6 Clicks Basic Mobility;Clinical judgement Functional Limitation: Mobility: Walking and moving around Mobility: Walking and Moving Around Current Status (E3329): At least 1 percent but less than 20 percent impaired, limited or restricted Mobility: Walking and Moving Around Goal Status [phone]): At least 1 percent but less than 20 percent impaired, limited or restricted Mobility: Walking and Moving Around Discharge Status [phone]): At least 1 percent but less than 20 percent impaired, limited or restricted     [facility] 04/21/2016, 12:22 PM

## 2016-04-21 NOTE — Evaluation (Addendum)
Occupational Therapy Evaluation Patient Details Name: Vickie Little MRN: [id number] DOB: 03/18/1965 Today's Date: 04/21/2016    History of Present Illness  s/p L5-S1 decompression   Clinical Impression   This 51 year old female was admitted for the above sx. All education was completed. No further OT is needed at this time    Follow Up Recommendations  No OT follow up;Supervision/Assistance - 24 hour    Equipment Recommendations  3 in 1 bedside commode    Recommendations for Other Services       Precautions / Restrictions Precautions Precautions: Back;Fall      Mobility Bed Mobility Overal bed mobility: Needs Assistance Bed Mobility: Rolling;Sidelying to Sit Rolling: Min guard Sidelying to sit: Min guard       General bed mobility comments: cues for sequence  Transfers Overall transfer level: Needs assistance Equipment used: Rolling walker (2 wheeled) Transfers: Sit to/from Stand Sit to Stand: Min guard         General transfer comment: for safety. Cues for ue placement    Balance                                            ADL Overall ADL's : Needs assistance/impaired Eating/Feeding: Independent   Grooming: Oral care;Supervision/safety;Standing   Upper Body Bathing: Set up;Sitting   Lower Body Bathing: Moderate assistance;Sit to/from stand   Upper Body Dressing : Set up;Sitting   Lower Body Dressing: Maximal assistance;Sit to/from stand   Toilet Transfer: Min guard;BSC;Ambulation;RW   Toileting- Clothing Manipulation and Hygiene: Minimal assistance;Sit to/from Nurse, children's Details (indicate cue type and reason): educated on tub transfer following back precautions:  did not practice Functional mobility during ADLs: Min guard General ADL Comments: performed ADL. Educated on back precautions and adls.  handout given     Vision         Perception     Praxis      Pertinent Vitals/Pain       Hand  Dominance     Extremity/Trunk Assessment Upper Extremity Assessment Upper Extremity Assessment: Overall WFL for tasks assessed           Communication Communication Communication: No difficulties   Cognition Arousal/Alertness: Awake/alert Behavior During Therapy: WFL for tasks assessed/performed Overall Cognitive Status: Within Functional Limits for tasks assessed                     General Comments       Exercises       Shoulder Instructions      Home Living Family/patient expects to be discharged to:: Private residence Living Arrangements: Spouse/significant other Available Help at Discharge: Family               Bathroom Shower/Tub: Chief Strategy Officer: Standard                Prior Functioning/Environment Level of Independence: Independent                 OT Problem List:        OT Treatment/Interventions:      OT Goals(Current goals can be found in the care plan section) Acute Rehab OT Goals Patient Stated Goal: return to independence OT Goal Formulation: All assessment and education complete, DC therapy  OT Frequency:     Barriers to D/C:  Co-evaluation              End of Session    Activity Tolerance: Patient tolerated treatment well Patient left: in chair;with call bell/phone within reach  OT Visit Diagnosis: Muscle weakness (generalized) (M62.81)                ADL either performed or assessed with clinical judgement  Time: [id number] OT Time Calculation (min): 32 min Charges:  OT General Charges $OT Visit: 1 Procedure OT Evaluation $OT Eval Low Complexity: 1 Procedure OT Treatments $Self Care/Home Management : 8-22 mins G-Codes: OT G-codes **NOT FOR INPATIENT CLASS** Functional Assessment Tool Used: AM-PAC 6 Clicks Daily Activity;Clinical judgement Functional Limitation: Self care Self Care Current Status (V4098(G8987): At least 40 percent but less than 60 percent impaired, limited  or restricted Self Care Goal Status (J1914(G8988): At least 40 percent but less than 60 percent impaired, limited or restricted Self Care Discharge Status [phone](G8989): At least 40 percent but less than 60 percent impaired, limited or restricted   [name], OTR/L [phone] 04/21/2016  Vickie Little 04/21/2016, 9:17 AM

## 2016-04-21 NOTE — Progress Notes (Signed)
Subjective: 1 Day Post-Op Procedure(s) (LRB): 1. central decompression for spinal stenosis   1.central decompression for spinal stenosis L5-S1  2.foramionotomy L5 root and S1nerve root left       3. Microdisectomy L5-S1 left   (Left) Patient reports pain as 1 on 0-10 scale.    Objective: Vital signs in last 24 hours: Temp:  [97.5 F (36.4 C)-98.9 F (37.2 C)] 98.9 F (37.2 C) (03/01 0614) Pulse Rate:  [77-117] 80 (03/01 0614) Resp:  [13-19] 16 (03/01 0614) BP: (111-160)/(69-89) 142/72 (03/01 0614) SpO2:  [100 %] 100 % (03/01 0614) Weight:  [80.7 kg (178 lb)] 80.7 kg (178 lb) (02/28 1714)  Intake/Output from previous day: 02/28 0701 - 03/01 0700 In: 3541.7 [P.O.:1590; I.V.:1901.7; IV Piggyback:50] Out: 1080 [Urine:1080] Intake/Output this shift: No intake/output data recorded.  No results for input(s): HGB in the last 72 hours. No results for input(s): WBC, RBC, HCT, PLT in the last 72 hours. No results for input(s): NA, K, CL, CO2, BUN, CREATININE, GLUCOSE, CALCIUM in the last 72 hours. No results for input(s): LABPT, INR in the last 72 hours.  Neurologically intact Dorsiflexion/Plantar flexion intact  Assessment/Plan: 1 Day Post-Op Procedure(s) (LRB): 1. central decompression for spinal stenosis   1.central decompression for spinal stenosis L5-S1  2.foramionotomy L5 root and S1nerve root left       3. Microdisectomy L5-S1 left   (Left) Up with therapy Discharge home with home health  Griselda Bramblett A 04/21/2016, 7:30 AM

## 2016-04-21 NOTE — Care Management Note (Signed)
Case Management Note  Patient Details  Name: Vickie Little MRN: [id number] Date of Birth: 12/08/1965  Subjective/Objective:                  1.central decompression for spinal stenosis L5-S1  2.foramionotomy L5 root and S1nerve root left       3. Microdisectomy L5-S1 left   (Left) Action/Plan: Discharge planning Expected Discharge Date:  04/21/16               Expected Discharge Plan:  Home/Self Care  In-House Referral:     Discharge planning Services  CM Consult  Post Acute Care Choice:  NA Choice offered to:  Patient  DME Arranged:  3-N-1, Walker rolling DME Agency:  [place]:  NA [place] Agency:  NA  Status of Service:  Completed, signed off  If discussed at [place] of Stay Meetings, dates discussed:    Additional Comments: CM met with pt in room to assess home needs pt requests rolling walker and 3n1. CM notified [place] DME rep, Joelene Millin to please deliver DME to room prior to discharge. No other CM needs were communicated. Dellie Catholic, RN 04/21/2016, 11:17 AM

## 2016-04-25 NOTE — Discharge Summary (Signed)
Physician Discharge Summary   Patient ID: Vickie Little MRN: [id number] DOB/AGE: 1966-02-06 51 y.o.  Admit date: 04/20/2016 Discharge date: 04/21/2016  Primary Diagnosis: Lumbar spinal stenosis             Lumber disc herniation  Admission Diagnoses:  Past Medical History:  Diagnosis Date  . Arthritis   . Diabetes mellitus without complication (HCC)    type II   . Heart attack   . Hypertension    Discharge Diagnoses:   Active Problems:   Spinal stenosis, lumbar region with neurogenic claudication  Estimated body mass index is 28.3 kg/m as calculated from the following:   Height as of this encounter: 5' 6.5" (1.689 m).   Weight as of this encounter: 80.7 kg (178 lb).  Procedure:  Procedure(s) (LRB): 1. central decompression for spinal stenosis   1.central decompression for spinal stenosis L5-S1  2.foramionotomy L5 root and S1nerve root left       3. Microdisectomy L5-S1 left   (Left)   Consults: None  HPI: The patient presented with the chief complaint of pain in her back radiating into the left buttocks down her left lower extremity. It has been present for about three months. She did lift a heavy child at one time that may have caused it, but she was not sure. MRI showed a disc herniation at L5-S1 on the left.   Laboratory Data: Admission on 04/20/2016, Discharged on 04/21/2016  Component Date Value Ref Range Status  . Glucose-Capillary 04/20/2016 203* 65 - 99 mg/dL Final  . Comment 1 96/56/5994 Document in Chart   Final  . Glucose-Capillary 04/20/2016 180* 65 - 99 mg/dL Final  . Glucose-Capillary 04/20/2016 312* 65 - 99 mg/dL Final  . Glucose-Capillary 04/21/2016 207* 65 - 99 mg/dL Final  . Glucose-Capillary 04/21/2016 233* 65 - 99 mg/dL Final  Hospital Outpatient Visit on 04/15/2016  Component Date Value Ref Range Status  . Hgb A1c MFr Bld 04/15/2016 10.5* 4.8 - 5.6 % Final   Comment: (NOTE)         Pre-diabetes: 5.7 - 6.4         Diabetes: >6.4  Glycemic control for adults with diabetes: <7.0   . Mean Plasma Glucose 04/15/2016 255  mg/dL Final   Comment: (NOTE) Performed At: [facility] [address] [place], [place] [id number] Mila Homer MD [id number]   . aPTT 04/15/2016 29  24 - 36 seconds Final  . WBC 04/15/2016 5.9  4.0 - 10.5 K/uL Final  . RBC 04/15/2016 4.67  3.87 - 5.11 MIL/uL Final  . Hemoglobin 04/15/2016 12.3  12.0 - 15.0 g/dL Final  . HCT 27/55/6239 36.6  36.0 - 46.0 % Final  . MCV 04/15/2016 78.4  78.0 - 100.0 fL Final  . MCH 04/15/2016 26.3  26.0 - 34.0 pg Final  . MCHC 04/15/2016 33.6  30.0 - 36.0 g/dL Final  . RDW 21/51/5826 13.5  11.5 - 15.5 % Final  . Platelets 04/15/2016 286  150 - 400 K/uL Final  . Neutrophils Relative % 04/15/2016 57  % Final  . Neutro Abs 04/15/2016 3.4  1.7 - 7.7 K/uL Final  . Lymphocytes Relative 04/15/2016 33  % Final  . Lymphs Abs 04/15/2016 1.9  0.7 - 4.0 K/uL Final  . Monocytes Relative 04/15/2016 8  % Final  . Monocytes Absolute 04/15/2016 0.4  0.1 - 1.0 K/uL Final  . Eosinophils Relative 04/15/2016 1  % Final  . Eosinophils Absolute 04/15/2016 0.1  0.0 - 0.7  K/uL Final  . Basophils Relative 04/15/2016 1  % Final  . Basophils Absolute 04/15/2016 0.0  0.0 - 0.1 K/uL Final  . Sodium 04/15/2016 135  135 - 145 mmol/L Final  . Potassium 04/15/2016 3.9  3.5 - 5.1 mmol/L Final  . Chloride 04/15/2016 102  101 - 111 mmol/L Final  . CO2 04/15/2016 25  22 - 32 mmol/L Final  . Glucose, Bld 04/15/2016 227* 65 - 99 mg/dL Final  . BUN 04/15/2016 11  6 - 20 mg/dL Final  . Creatinine, Ser 04/15/2016 0.67  0.44 - 1.00 mg/dL Final  . Calcium 04/15/2016 9.8  8.9 - 10.3 mg/dL Final  . Total Protein 04/15/2016 8.1  6.5 - 8.1 g/dL Final  . Albumin 04/15/2016 4.6  3.5 - 5.0 g/dL Final  . AST 04/15/2016 17  15 - 41 U/L Final  . ALT 04/15/2016 25  14 - 54 U/L Final  . Alkaline Phosphatase 04/15/2016 245* 38 - 126 U/L Final  . Total Bilirubin 04/15/2016 0.9  0.3 - 1.2 mg/dL Final    . GFR calc non Af Amer 04/15/2016 >60  >60 mL/min Final  . GFR calc Af Amer 04/15/2016 >60  >60 mL/min Final   Comment: (NOTE) The eGFR has been calculated using the CKD EPI equation. This calculation has not been validated in all clinical situations. eGFR's persistently <60 mL/min signify possible Chronic Kidney Disease.   . Anion gap 04/15/2016 8  5 - 15 Final  . Prothrombin Time 04/15/2016 13.6  11.4 - 15.2 seconds Final  . INR 04/15/2016 1.03   Final  . ABO/RH(D) 04/15/2016 O POS   Final  . Antibody Screen 04/15/2016 NEG   Final  . Sample Expiration 04/15/2016 04/23/2016   Final  . Extend sample reason 04/15/2016 NO TRANSFUSIONS OR PREGNANCY IN THE PAST 3 MONTHS   Final  . Color, Urine 04/15/2016 YELLOW  YELLOW Final  . APPearance 04/15/2016 CLOUDY* CLEAR Final  . Specific Gravity, Urine 04/15/2016 1.029  1.005 - 1.030 Final  . pH 04/15/2016 5.0  5.0 - 8.0 Final  . Glucose, UA 04/15/2016 150* NEGATIVE mg/dL Final  . Hgb urine dipstick 04/15/2016 SMALL* NEGATIVE Final  . Bilirubin Urine 04/15/2016 NEGATIVE  NEGATIVE Final  . Ketones, ur 04/15/2016 NEGATIVE  NEGATIVE mg/dL Final  . Protein, ur 04/15/2016 100* NEGATIVE mg/dL Final  . Nitrite 04/15/2016 POSITIVE* NEGATIVE Final  . Leukocytes, UA 04/15/2016 TRACE* NEGATIVE Final  . RBC / HPF 04/15/2016 6-30  0 - 5 RBC/hpf Final  . WBC, UA 04/15/2016 6-30  0 - 5 WBC/hpf Final  . Bacteria, UA 04/15/2016 MANY* NONE SEEN Final  . Squamous Epithelial / LPF 04/15/2016 TOO NUMEROUS TO COUNT* NONE SEEN Final  . Mucous 04/15/2016 PRESENT   Final  . MRSA, PCR 04/15/2016 NEGATIVE  NEGATIVE Final  . Staphylococcus aureus 04/15/2016 NEGATIVE  NEGATIVE Final   Comment:        The Xpert SA Assay (FDA approved for NASAL specimens in patients over 82 years of age), is one component of a comprehensive surveillance program.  Test performance has been validated by [facility] for patients greater than or equal to 77 year old. It is not  intended to diagnose infection nor to guide or monitor treatment.   . Glucose-Capillary 04/15/2016 223* 65 - 99 mg/dL Final  . Preg, Serum 04/15/2016 NEGATIVE  NEGATIVE Final   Comment:        THE SENSITIVITY OF THIS METHODOLOGY IS >10 mIU/mL.   . ABO/RH(D) 04/15/2016  O POS   Final     X-Rays:Dg Chest 2 View  Result Date: 04/15/2016 CLINICAL DATA:  Preop for lumbar surgery EXAM: CHEST  2 VIEW COMPARISON:  06/08/2013 and 12/ 18/13 FINDINGS: Cardiomediastinal silhouette is stable. No infiltrate or pleural effusion. No pulmonary edema. Stable minimal linear scarring in right upper lobe laterally. Mild degenerative changes mid and lower thoracic spine. IMPRESSION: No active cardiopulmonary disease. Electronically Signed   By: Lahoma Crocker M.D.   On: 04/15/2016 11:21   Dg Lumbar Spine 2-3 Views  Result Date: 04/15/2016 CLINICAL DATA:  Preoperative evaluation for lumbar surgery.  Lumbago EXAM: LUMBAR SPINE - 2-3 VIEW COMPARISON:  Lumbar myelogram with postmyelogram lumbar spine CT March 22, 2016 FINDINGS: Standing frontal and standing lateral views were obtained. There are 5 non-rib-bearing lumbar type vertebral bodies. There is no appreciable fracture or spondylolisthesis. The disc spaces appear normal in the lumbar region. There is disc space narrowing in the visualized lower thoracic region. There are lower thoracic and upper lumbar small anterior osteophytes. No erosive change. There are foci of atherosclerotic calcification in the common iliac arteries bilaterally. IMPRESSION: Five non-rib-bearing lumbar type vertebral bodies. No fracture or spondylolisthesis. No appreciable lumbar disc space narrowing. There is degenerative type change in the lower thoracic region. 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 Electronically Signed   By: Lowella Grip III M.D.   On: 04/15/2016 11:25   Dg Spine Portable 1 View  Result Date: 04/20/2016 CLINICAL DATA:  Lumbar spine surgery. EXAM:  PORTABLE SPINE - 1 VIEW COMPARISON:  Radiographs earlier today FINDINGS: A single lateral intraoperative radiograph of the lower lumbar spine is provided. The lowest fully formed disc space is designated L5-S1 as on the prior study. Two surgical probes project near the posterior margin of the L5-S1 facets. Tissue retractors are in place, and the L5 spinous process has been resected in the interim. IMPRESSION: Intraoperative localization of L5-S1. Electronically Signed   By: Logan Bores M.D.   On: 04/20/2016 15:14   Dg Spine Portable 1 View  Result Date: 04/20/2016 CLINICAL DATA:  51 year old female undergoing lumbar surgery. Initial encounter. EXAM: PORTABLE SPINE - 1 VIEW COMPARISON:  1405 hours today and earlier. FINDINGS: Normal lumbar segmentation, same numbering system as on today comparison and earlier. Intraoperative portable cross-table lateral view of the lumbar spine at 1431 hours. Surgical clamp on the L5 spinous process. Surgical probe directed toward the L5-S1 disc space level. Mild anterolisthesis re- demonstrated at that level. IMPRESSION: Intraoperative localization at L5-S1. Electronically Signed   By: Genevie Ann M.D.   On: 04/20/2016 14:51   Dg Spine Portable 1 View  Result Date: 04/20/2016 CLINICAL DATA:  Intraoperative x-ray.  Lumbar spine surgery. EXAM: PORTABLE SPINE - 1 VIEW COMPARISON:  Two-view lumbar spine radiographs 04/15/2016. FINDINGS: A single lateral intraoperative radiograph is submitted. There 2 needles in place. The lower needle is directed at the L5 spinous process. The upper needle is at the inferior aspect of the L4 spinous process. IMPRESSION: Intraoperative localization of the L4 and L5 spinous processes. Electronically Signed   By: San Morelle M.D.   On: 04/20/2016 14:32    EKG: Orders placed or performed during the hospital encounter of 04/15/16  . EKG  . EKG     Hospital Course: LUJUANA KAPLER is a 51 y.o. who was admitted to [facility]. They were brought to the operating room on 04/20/2016 and underwent Procedure(s): 1. central decompression for spinal stenosis   1.central decompression  for spinal stenosis L5-S1  2.foramionotomy L5 root and S1nerve root left       3. Microdisectomy L5-S1 left  .  Patient tolerated the procedure well and was later transferred to the recovery room and then to the orthopaedic floor for postoperative care.  They were given PO and IV analgesics for pain control following their surgery.  They were given 24 hours of postoperative antibiotics of  Anti-infectives    Start     Dose/Rate Route Frequency Ordered Stop   04/20/16 2200  ceFAZolin (ANCEF) IVPB 1 g/50 mL premix  Status:  Discontinued     1 g 100 mL/hr over 30 Minutes Intravenous Every 8 hours 04/20/16 1727 04/21/16 1705   04/20/16 1439  polymyxin B 500,000 Units, bacitracin 50,000 Units in sodium chloride irrigation 0.9 % 500 mL irrigation  Status:  Discontinued       As needed 04/20/16 1439 04/20/16 1611   04/20/16 1102  ceFAZolin (ANCEF) IVPB 2g/100 mL premix     2 g 200 mL/hr over 30 Minutes Intravenous On call to O.R. 04/20/16 1102 04/20/16 1400     and started on DVT prophylaxis in the form of Aspirin.   PT was ordered. Discharge planning consulted to help with postop disposition and equipment needs.  Patient had a good night on the evening of surgery.  They started to get up OOB with therapy on day one. Dressing was changed and the incision was clean and dry. The patient had progressed with therapy and meeting their goals.  Incision was healing well.  Patient was seen in rounds and was ready to go home.   Diet: Cardiac diet Activity:WBAT Follow-up:in 2 weeks Disposition - Home Discharged Condition: stable   Discharge Instructions    Call MD / Call 911    Complete by:  As directed    If you experience chest pain or shortness of breath, CALL 911 and be transported to the hospital emergency room.  If you develope a fever above  101 F, pus (white drainage) or increased drainage or redness at the wound, or calf pain, call your surgeon's office.   Constipation Prevention    Complete by:  As directed    Drink plenty of fluids.  Prune juice may be helpful.  You may use a stool softener, such as Colace (over the counter) 100 mg twice a day.  Use MiraLax (over the counter) for constipation as needed.   Diet - low sodium heart healthy    Complete by:  As directed    Discharge instructions    Complete by:  As directed    For the first three days, remove your dressing and tape a piece of saran wrap over your incision Take your shower, then remove the saran wrap and put a clean dressing on. After three days you can shower without the saran wrap.  No lifting or bending. No driving while taking pain medications. Call Dr. Gladstone Lighter if any wound complications or temperature of 101 degrees F or over.  Call the office for an appointment to see Dr. Gladstone Lighter in two weeks: [phone] and ask for Dr. Charlestine Night nurse, Brunilda Payor.   Increase activity slowly as tolerated    Complete by:  As directed      Allergies as of 04/21/2016   No Known Allergies     Medication List    STOP taking these medications   acetaminophen 500 MG tablet Commonly known as:  TYLENOL   aspirin 81 MG chewable tablet  Replaced by:  aspirin EC 325 MG tablet   HYDROcodone-acetaminophen 5-325 MG tablet Commonly known as:  NORCO/VICODIN   ibuprofen 600 MG tablet Commonly known as:  ADVIL,MOTRIN     TAKE these medications   amLODipine 10 MG tablet Commonly known as:  NORVASC Take 10 mg by mouth daily.   aspirin EC 325 MG tablet Take 1 tablet (325 mg total) by mouth daily. Replaces:  aspirin 81 MG chewable tablet   atorvastatin 10 MG tablet Commonly known as:  LIPITOR Take 10 mg by mouth daily at 6 PM.   BASAGLAR KWIKPEN 100 UNIT/ML Sopn Inject 25 Units into the skin daily. Patient takes in am   docusate sodium 100 MG capsule Commonly known  as:  COLACE Take 1 capsule (100 mg total) by mouth 2 (two) times daily.   gabapentin 300 MG capsule Commonly known as:  NEURONTIN Take 300 mg by mouth 3 (three) times daily.   metFORMIN 500 MG tablet Commonly known as:  GLUCOPHAGE Take 1,000 mg by mouth 2 (two) times daily with a meal.   methocarbamol 500 MG tablet Commonly known as:  ROBAXIN Take 1 tablet (500 mg total) by mouth every 6 (six) hours as needed for muscle spasms.   multivitamin with minerals Tabs tablet Take 1 tablet by mouth daily. Hasn't had in a week   oxyCODONE-acetaminophen 5-325 MG tablet Commonly known as:  PERCOCET/ROXICET Take 1-2 tablets by mouth every 4 (four) hours as needed for moderate pain.   polyethylene glycol powder powder Commonly known as:  GLYCOLAX/MIRALAX Take 17 g by mouth 2 (two) times daily as needed.      Follow-up Information    GIOFFRE,RONALD A, MD. Schedule an appointment as soon as possible for a visit in 2 week(s).   Specialty:  Orthopedic Surgery Contact information: [address] [place]  [address] [phone]        [facility] Follow up.   Why:  rolling walker and 3n1 Contact information: [place] [address] [phone]           Signed: Ardeen Jourdain, PA-C Orthopaedic Surgery 04/25/2016, 9:05 AM

## 2016-07-25 NOTE — Addendum Note (Signed)
Addendum  created 07/25/16 1141 by Sakia Schrimpf, MD   Sign clinical note    

## 2017-03-09 IMAGING — DX DG SPINE 1V PORT
1 series · 1 of 1 positions shown · non-contrast
Comparison: Radiographs earlier today

CLINICAL DATA: Lumbar spine surgery.

EXAM:
PORTABLE SPINE - 1 VIEW

[l-spine lat]
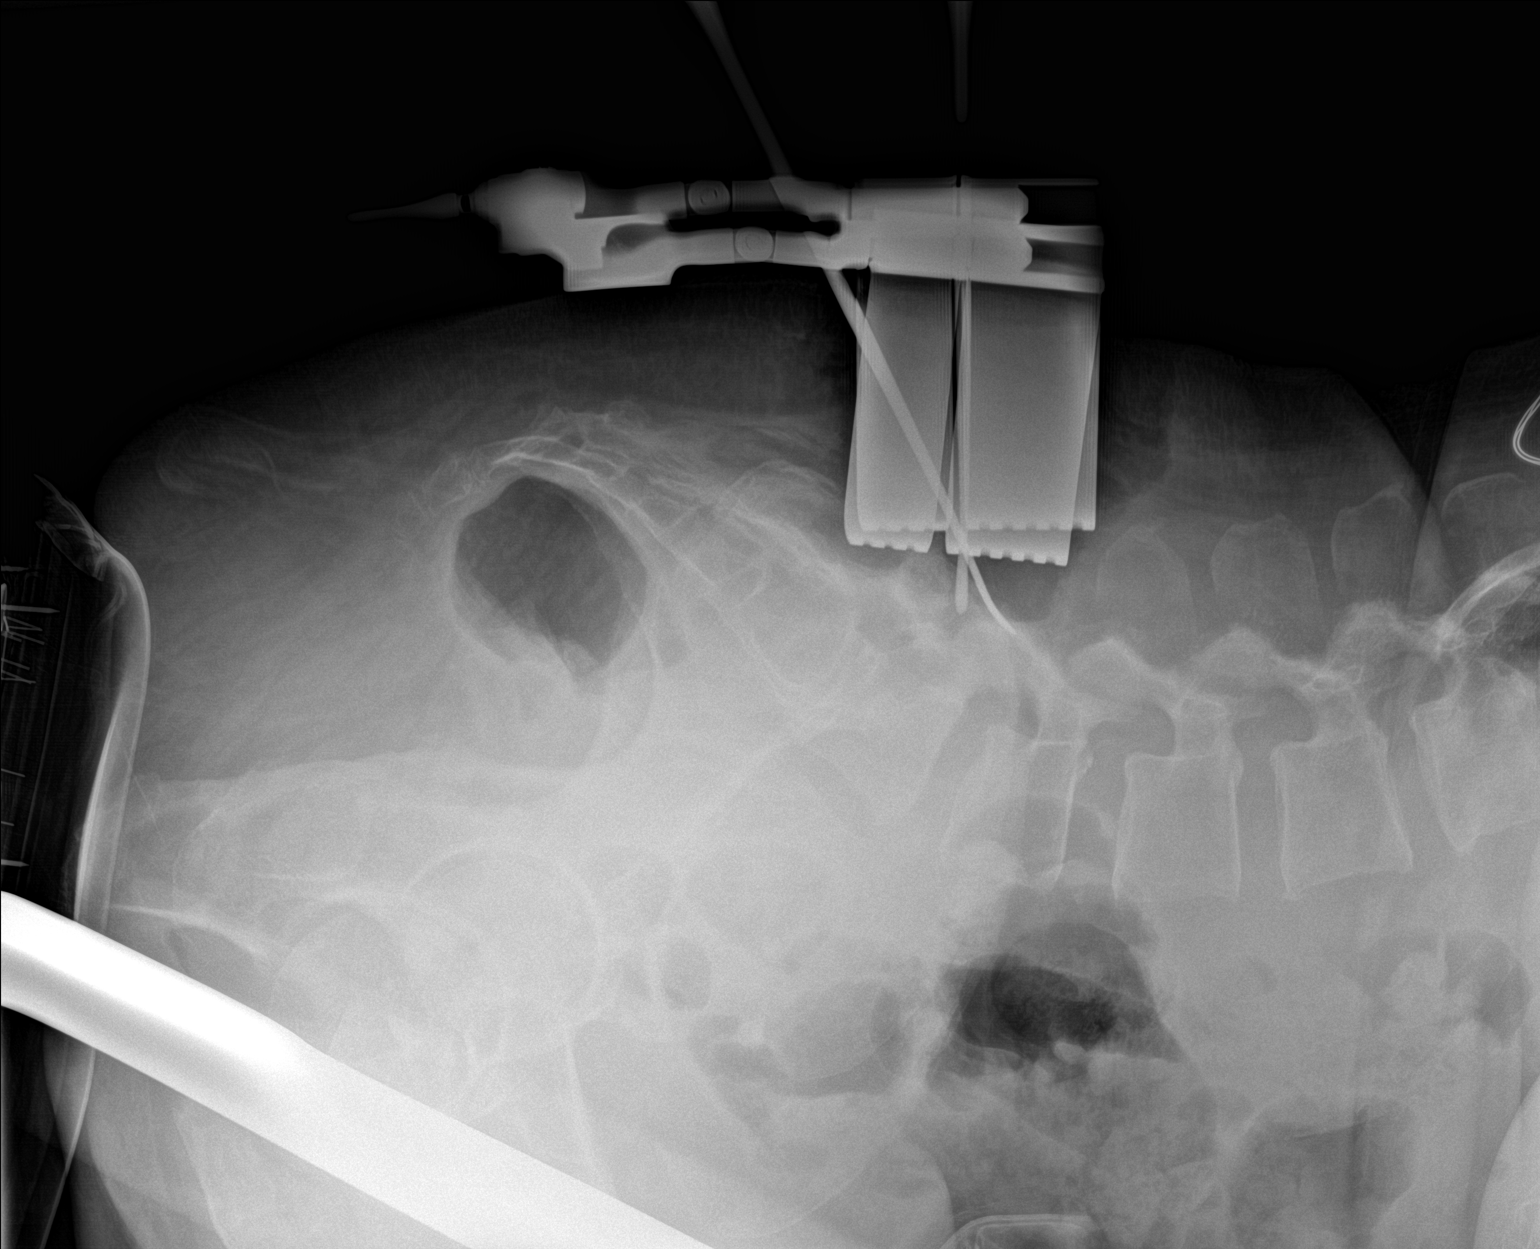

[1 of 1 positions shown; findings below may reference images not displayed]

FINDINGS: A single lateral intraoperative radiograph of the lower lumbar spine
is provided. The lowest fully formed disc space is designated L5-S1
as on the prior study. Two surgical probes project near the
posterior margin of the L5-S1 facets. Tissue retractors are in
place, and the L5 spinous process has been resected in the interim.
IMPRESSION: Intraoperative localization of L5-S1.

## 2018-05-02 ENCOUNTER — Other Ambulatory Visit: Payer: Self-pay | Admitting: Cardiology

## 2018-07-30 ENCOUNTER — Encounter: Payer: Self-pay | Admitting: Cardiology

## 2018-07-30 ENCOUNTER — Other Ambulatory Visit: Payer: Self-pay

## 2018-07-30 ENCOUNTER — Ambulatory Visit: Payer: Managed Care, Other (non HMO) | Admitting: Cardiology

## 2018-07-30 VITALS — BP 130/78 | HR 80 | Ht 67.0 in | Wt 197.1 lb

## 2018-07-30 DIAGNOSIS — I2510 Atherosclerotic heart disease of native coronary artery without angina pectoris: Secondary | ICD-10-CM

## 2018-07-30 DIAGNOSIS — I739 Peripheral vascular disease, unspecified: Secondary | ICD-10-CM | POA: Diagnosis not present

## 2018-07-30 DIAGNOSIS — Z794 Long term (current) use of insulin: Secondary | ICD-10-CM

## 2018-07-30 DIAGNOSIS — E1140 Type 2 diabetes mellitus with diabetic neuropathy, unspecified: Secondary | ICD-10-CM | POA: Diagnosis not present

## 2018-07-30 DIAGNOSIS — E7800 Pure hypercholesterolemia, unspecified: Secondary | ICD-10-CM | POA: Diagnosis not present

## 2018-07-30 HISTORY — DX: Atherosclerotic heart disease of native coronary artery without angina pectoris: I25.10

## 2018-07-30 MED ORDER — ATORVASTATIN CALCIUM 20 MG PO TABS: 20.0000 mg | ORAL_TABLET | Freq: Every day | ORAL | 1 refills | Status: DC

## 2018-07-30 MED ORDER — GABAPENTIN 300 MG PO CAPS: 300.0000 mg | ORAL_CAPSULE | Freq: Two times a day (BID) | ORAL | 2 refills | Status: DC

## 2018-07-30 NOTE — Progress Notes (Signed)
Primary Physician/Referring:  Verlon AuBoyd, Tammy Lamonica, MD  Patient ID: Vickie Little, female    DOB: 07/22/1965, 53 y.o.   MRN: [id number]  Chief Complaint  Patient presents with  . PAD  . Claudication  . Coronary Artery Disease  . Follow-up    6 mth    HPI: Vickie Little  is a 53 y.o. female  with hypertension, former tobacco use, diabetes, hyperlipidemia, CAD with NSTEMI in 2013 and underwent PCI to LAD  at that time. She has severe peripheral artery disease and also claudication involving PAD and neurogenic lumbar claudication, symptoms of claudication had improved since being on Pletal.  This is a six-month office visit.  She is presently doing well, continues to have resolution of claudication. She does have some occasional pain in her legs at the end of day while resting and also occasionally some tingling/ numbness sensation. Also mentions that on occasion her legs will feel cold. No discoloration or ulcerations. She is walking regularly and tolerates this well. No chest pain or dyspnea on exertion.   States diabetes has not been as well controlled recently due to eating more due to being at home more.   Past Medical History:  Diagnosis Date  . Arthritis   . CAD (coronary artery disease), native coronary artery 07/30/2018  . Diabetes mellitus without complication (HCC)    type II   . Heart attack (HCC)   . Hypertension   . NSTEMI (non-ST elevated myocardial infarction) (HCC) 02/08/2012    Past Surgical History:  Procedure Laterality Date  . CORONARY ANGIOPLASTY WITH STENT PLACEMENT    . LEFT HEART CATHETERIZATION WITH CORONARY ANGIOGRAM N/A 02/13/2012   Procedure: LEFT HEART CATHETERIZATION WITH CORONARY ANGIOGRAM;  Surgeon: Donato SchultzMark Skains, MD;  [facility];  Service: Cardiovascular;  Laterality: N/A;  . LUMBAR LAMINECTOMY/DECOMPRESSION MICRODISCECTOMY Left 04/20/2016   Procedure: 1. central decompression for spinal stenosis   1.central decompression for spinal  stenosis L5-S1  2.foramionotomy L5 root and S1nerve root left       3. Microdisectomy L5-S1 left  ;  Surgeon: Ranee Gosselinonald Gioffre, MD;  Location: [facility] ORS;  Service: Orthopedics;  Laterality: Left;    Social History   Socioeconomic History  . Marital status: Married    Spouse name: Not on file  . Number of children: 1  . Years of education: Not on file  . Highest education level: Not on file  Occupational History  . Occupation: Magazine features editorteacher    Employer: [employer]    Comment: [employer]  Social Needs  . Financial resource strain: Not on file  . Food insecurity:    Worry: Not on file    Inability: Not on file  . Transportation needs:    Medical: Not on file    Non-medical: Not on file  Tobacco Use  . Smoking status: Former Smoker    Packs/day: 0.00    Years: 20.00    Pack years: 0.00    Last attempt to quit: 08/22/2015    Years since quitting: 2.9  . Smokeless tobacco: Never Used  Substance and Sexual Activity  . Alcohol use: Yes    Comment: socially. 2-3 beers on weekend.  . Drug use: No  . Sexual activity: Yes  Lifestyle  . Physical activity:    Days per week: Not on file    Minutes per session: Not on file  . Stress: Not on file  Relationships  . Social connections:    Talks on phone:  Not on file    Gets together: Not on file    Attends religious service: Not on file    Active member of club or organization: Not on file    Attends meetings of clubs or organizations: Not on file    Relationship status: Not on file  . Intimate partner violence:    Fear of current or ex partner: Not on file    Emotionally abused: Not on file    Physically abused: Not on file    Forced sexual activity: Not on file  Other Topics Concern  . Not on file  Social History Narrative  . Not on file    Review of Systems  Constitution: Negative for chills, decreased appetite, malaise/fatigue and weight gain.  Cardiovascular: Negative for chest pain, claudication,  dyspnea on exertion, leg swelling and syncope.  Endocrine: Negative for cold intolerance and heat intolerance.  Hematologic/Lymphatic: Does not bruise/bleed easily.  Musculoskeletal: Positive for muscle weakness (left leg from sciatica). Negative for joint swelling.  Gastrointestinal: Negative for abdominal pain, anorexia, change in bowel habit, hematochezia and melena.  Neurological: Positive for numbness (intermittent bilateral lower extremities) and paresthesias (left leg from sciatica). Negative for headaches and light-headedness.  Psychiatric/Behavioral: Negative for depression and substance abuse.  All other systems reviewed and are negative.     Objective  Blood pressure 130/78, pulse 80, height 5\' 7"  (1.702 m), weight 197 lb 1.6 oz (89.4 kg), SpO2 95 %. Body mass index is 30.87 kg/m.    Physical Exam  Constitutional: She appears well-developed and well-nourished. No distress.  HENT:  Head: Atraumatic.  Eyes: Conjunctivae are normal.  Neck: Neck supple. No JVD present. No thyromegaly present.  Cardiovascular: Normal rate, regular rhythm, normal heart sounds and intact distal pulses. Exam reveals no gallop.  No murmur heard. Pulses:      Carotid pulses are on the right side with bruit and on the left side with bruit.      Femoral pulses are 2+ on the right side and 2+ on the left side.      Popliteal pulses are 1+ on the right side and 1+ on the left side.       Dorsalis pedis pulses are 1+ on the right side and 0 on the left side.       Posterior tibial pulses are 0 on the right side and 1+ on the left side.  No edema   Pulmonary/Chest: Effort normal and breath sounds normal.  Abdominal: Soft. Bowel sounds are normal.  Musculoskeletal: Normal range of motion.  Neurological: She is alert.  Skin: Skin is warm and dry.  Psychiatric: She has a normal mood and affect.   Radiology: No results found.  Laboratory examination:    CMP Latest Ref Rng & Units 04/15/2016 01/23/2016  12/14/2014  Glucose 65 - 99 mg/dL 161(W227(H) 960(A214(H) 540(J132(H)  BUN 6 - 20 mg/dL 11 11 12   Creatinine 0.44 - 1.00 mg/dL 8.110.67 9.140.50 7.820.58  Sodium 135 - 145 mmol/L 135 136 134(L)  Potassium 3.5 - 5.1 mmol/L 3.9 4.3 4.4  Chloride 101 - 111 mmol/L 102 101 99  CO2 22 - 32 mmol/L 25 - 25  Calcium 8.9 - 10.3 mg/dL 9.8 - 9.9  Total Protein 6.5 - 8.1 g/dL 8.1 - 7.2  Total Bilirubin 0.3 - 1.2 mg/dL 0.9 - 0.6  Alkaline Phos 38 - 126 U/L 245(H) - 289(H)  AST 15 - 41 U/L 17 - 10  ALT 14 - 54 U/L 25 - 11  CBC Latest Ref Rng & Units 04/15/2016 01/23/2016 12/14/2014  WBC 4.0 - 10.5 K/uL 5.9 - 6.1  Hemoglobin 12.0 - 15.0 g/dL 12.3 13.6 13.5  Hematocrit 36.0 - 46.0 % 36.6 40.0 40.0  Platelets 150 - 400 K/uL 286 - -   Lipid Panel     Component Value Date/Time   CHOL 160 08/04/2014 1437   TRIG 107 08/04/2014 1437   HDL 49 08/04/2014 1437   CHOLHDL 3.3 08/04/2014 1437   VLDL 21 08/04/2014 1437   LDLCALC 90 08/04/2014 1437   HEMOGLOBIN A1C Lab Results  Component Value Date   HGBA1C 10.5 (H) 04/15/2016   MPG 255 04/15/2016   TSH No results for input(s): TSH in the last 8760 hours.  PRN Meds:. There are no discontinued medications. Current Meds  Medication Sig  . aspirin EC 325 MG tablet Take 1 tablet (325 mg total) by mouth daily.  Marland Kitchen atorvastatin (LIPITOR) 10 MG tablet Take 10 mg by mouth daily at 6 PM.  . cilostazol (PLETAL) 50 MG tablet TAKE 1 TABLET BY MOUTH TWICE DAILY  . empagliflozin (JARDIANCE) 25 MG TABS tablet Take 1 tablet by mouth daily.  . metFORMIN (GLUCOPHAGE) 500 MG tablet Take 1,000 mg by mouth 2 (two) times daily with a meal.   . Multiple Vitamin (MULTIVITAMIN WITH MINERALS) TABS tablet Take 1 tablet by mouth daily. Hasn't had in a week   Current Facility-Administered Medications for the 07/30/18 encounter (Office Visit) with Miquel Dunn, NP  Medication  . gi cocktail (Maalox,Lidocaine,Donnatal)    Cardiac Studies:   Coronary angiogram 02/13/2012:  Mid LAD  Overlapping 3.0 x 16 mm and2.75 x 38 mm Promus DES.  Diagonal vessels with ostial 95% stenosis, small calibered.  Mild disease in circumflex and right coronary artery.  EF 55%.  Lexiscan sestamibi stress test 04/11/2016: 1. The resting electrocardiogram demonstrated normal sinus rhythm, normal resting conduction, no resting arrhythmias and normal rest repolarization.  Low voltage and poor R progression. Stress EKG is non-diagnostic for ischemia as it a pharmacologic stress using Lexiscan. Stress symptoms included dyspnea. 2. Myocardial perfusion imaging is normal. Overall left ventricular systolic function was normal without regional wall motion abnormalities. The left ventricular ejection fraction was 66%.  This is a low risk study.  Lower extremity arterial duplex 01/10/2018: Diffuse calcific plaque bilaterally. Biphasic waveform right lower extremity without high grade stenosis. Right PT appears occluded. Left proximal SFA >50% stenosis with calcific plaque.  This exam reveals mildly decreased perfusion of the right lower extremity, noted at the dorsalis pedis and post tibial artery level (ABI 0.86) and moderately decreased perfusion of the left lower extremity, noted at the dorsalis pedis and post tibial artery level (ABI 0.68).  Compared to the study done on 05/24/2016, left SFA stenosis is new.  Otherwise no significant change.  Carotid artery duplex 01/10/2018: Stenosis in the bilateral external carotid artery (<50%) with mild heterogeneous plaque. Source of bruit. Antegrade right vertebral artery flow. Antegrade left vertebral artery flow. Follow up studies when clinically indicated.   Assessment   Claudication in peripheral vascular disease ([place]) - Plan: EKG 12-Lead  Coronary artery disease involving native coronary artery of native heart without angina pectoris - 02/13/2012: Mid LAD Overlapping 3.0 x 16 mm and2.75 x 38 mm Promus DES, Ostial D1 90%, small.  - Plan: EKG 12-Lead   Hypercholesteremia - Plan: EKG 12-Lead  EKG 07/30/2018: Normal sinus rhythm at 81 bpm, left atrial enlargement, normal axis, anterior infarct old. No evidence of ischemia.  Recommendations:   Patient continues to do well without any symptoms of angina. Exercising regularly and tolerating this well. Blood pressure is well controlled. Since being on Pletal, her claudication symptoms have essentially resolved. No changes to vascular exam. She does have some pain at rest and some intermittent tingling/numbness. She is without any ischemic changes. I suspect that her pain and numbness may be contributed by her spinal issues as well as potentially diabetic neuropathy. She was previously on gabapentin prior to her back surgery in 2018 , will restart this to see if she has improvement. I have reviewed her lipids from Nov 2019, will increase Lipitor to 20 mg daily as her LDL is not at goal of less than 55. She will likely need higher dose or addition of Zetia. States that she will have labs performed in July with Dr. Leavy CellaBoyd for follow up on her diabetes as well. I will plan to see her back in 6 months with repeat lower extremity duplex for surveillance of PAD.  Toniann FailAshton Haynes Kelley, MSN, APRN, FNP-C [facility] Office: [phone] Fax: [phone]

## 2018-11-02 ENCOUNTER — Encounter: Payer: Self-pay | Admitting: Internal Medicine

## 2018-11-02 ENCOUNTER — Ambulatory Visit (INDEPENDENT_AMBULATORY_CARE_PROVIDER_SITE_OTHER): Payer: Managed Care, Other (non HMO) | Admitting: Internal Medicine

## 2018-11-02 ENCOUNTER — Other Ambulatory Visit: Payer: Self-pay

## 2018-11-02 VITALS — BP 146/88 | HR 80 | Temp 98.7°F | Ht 67.0 in | Wt 205.2 lb

## 2018-11-02 DIAGNOSIS — R748 Abnormal levels of other serum enzymes: Secondary | ICD-10-CM

## 2018-11-02 DIAGNOSIS — E559 Vitamin D deficiency, unspecified: Secondary | ICD-10-CM | POA: Diagnosis not present

## 2018-11-02 LAB — COMPREHENSIVE METABOLIC PANEL
ALT: 18 U/L (ref 0–35)
AST: 14 U/L (ref 0–37)
Albumin: 4.1 g/dL (ref 3.5–5.2)
Alkaline Phosphatase: 279 U/L — ABNORMAL HIGH (ref 39–117)
BUN: 13 mg/dL (ref 6–23)
CO2: 27 mEq/L (ref 19–32)
Calcium: 9.5 mg/dL (ref 8.4–10.5)
Chloride: 99 mEq/L (ref 96–112)
Creatinine, Ser: 0.58 mg/dL (ref 0.40–1.20)
GFR: 132.52 mL/min (ref >60.00)
Glucose, Bld: 187 mg/dL — ABNORMAL HIGH (ref 70–99)
Potassium: 4 mEq/L (ref 3.5–5.1)
Sodium: 134 mEq/L — ABNORMAL LOW (ref 135–145)
Total Bilirubin: 0.7 mg/dL (ref 0.2–1.2)
Total Protein: 7 g/dL (ref 6.0–8.3)

## 2018-11-02 LAB — VITAMIN D 25 HYDROXY (VIT D DEFICIENCY, FRACTURES): VITD: 34.05 ng/mL (ref 30.00–100.00)

## 2018-11-02 LAB — TSH: TSH: 0.62 u[IU]/mL (ref 0.35–4.50)

## 2018-11-02 NOTE — Progress Notes (Signed)
Name: Vickie Little  MRN/ DOB: [id number], 04/24/1965    Age/ Sex: 53 y.o., female    PCP: Bartholome Bill, MD   Reason for Endocrinology Evaluation: Elevated Alkaline Phophatase     Date of Initial Endocrinology Evaluation: 11/02/2018     HPI: Vickie Little is a 53 y.o. female with a past medical history of HTN, Dyslipidemia, Hx of pancreatitis and Diabetes. The patient presented for initial endocrinology clinic visit on 11/02/2018 for consultative assistance with her elevated alkaline phosphatase.   Pt was referred here for evaluation of elevated alkaline phosphatase   She was noted to have a low vitamin D in 08/2018 , she is currently on Vitamin D3 3000 iu daily .  She is also on calcium + Vitamin d 600-400 IU whihch was recently started  She has been on [facility] for years.   Takes Biotin   Has knee aches and pain , minimal hand pain, has occasional back pain.   Had L5 lumbar surgery for disc bulge.    She is adopted   Sister with elevated ? LFT's    LMP 10/05/2018   HISTORY:  Past Medical History:  Past Medical History:  Diagnosis Date   Arthritis    CAD (coronary artery disease), native coronary artery 07/30/2018   Diabetes mellitus without complication ([place])    type II    Heart attack ([place])    Hypertension    NSTEMI (non-ST elevated myocardial infarction) ([place]) 02/08/2012   Past Surgical History:  Past Surgical History:  Procedure Laterality Date   CORONARY ANGIOPLASTY WITH STENT PLACEMENT     LEFT HEART CATHETERIZATION WITH CORONARY ANGIOGRAM N/A 02/13/2012   Procedure: LEFT HEART CATHETERIZATION WITH CORONARY ANGIOGRAM;  Surgeon: Candee Furbish, MD;  Location: [facility] CATH LAB;  Service: Cardiovascular;  Laterality: N/A;   LUMBAR LAMINECTOMY/DECOMPRESSION MICRODISCECTOMY Left 04/20/2016   Procedure: 1. central decompression for spinal stenosis   1.central decompression for spinal stenosis L5-S1  2.foramionotomy L5 root and S1nerve root left        3. Microdisectomy L5-S1 left  ;  Surgeon: Latanya Maudlin, MD;  Location: [facility]S;  Service: Orthopedics;  Laterality: Left;      Social History:  reports that she quit smoking about 3 years ago. She smoked 0.00 packs per day for 20.00 years. She has never used smokeless tobacco. She reports current alcohol use. She reports that she does not use drugs.  Family History: family history includes Diabetes in her father. She was adopted.   HOME MEDICATIONS: Allergies as of 11/02/2018   No Known Allergies     Medication List       Accurate as of November 02, 2018  2:19 PM. If you have any questions, ask your nurse or doctor.        STOP taking these medications   methocarbamol 500 MG tablet Commonly known as: ROBAXIN Stopped by: Dorita Sciara, MD   oxyCODONE-acetaminophen 5-325 MG tablet Commonly known as: PERCOCET/ROXICET Stopped by: Dorita Sciara, MD   polyethylene glycol powder 17 GM/SCOOP powder Commonly known as: GLYCOLAX/MIRALAX Stopped by: Dorita Sciara, MD     TAKE these medications   amLODipine 10 MG tablet Commonly known as: NORVASC Take 10 mg by mouth daily.   aspirin EC 325 MG tablet Take 1 tablet (325 mg total) by mouth daily.   atorvastatin 20 MG tablet Commonly known as: LIPITOR Take 1 tablet (20 mg total) by mouth daily.   Basaglar KwikPen 100 UNIT/ML  Sopn Inject 25 Units into the skin daily. Patient takes in am   cilostazol 50 MG tablet Commonly known as: PLETAL TAKE 1 TABLET BY MOUTH TWICE DAILY   docusate sodium 100 MG capsule Commonly known as: COLACE Take 1 capsule (100 mg total) by mouth 2 (two) times daily.   gabapentin 300 MG capsule Commonly known as: NEURONTIN Take 1 capsule (300 mg total) by mouth 2 (two) times daily.   Jardiance 25 MG Tabs tablet Generic drug: empagliflozin Take 1 tablet by mouth daily.   metFORMIN 500 MG tablet Commonly known as: GLUCOPHAGE Take 1,000 mg by mouth 2 (two) times daily with a  meal.   multivitamin with minerals Tabs tablet Take 1 tablet by mouth daily. Hasn't had in a week         REVIEW OF SYSTEMS: A comprehensive ROS was conducted with the patient and is negative except as per HPI and below:  Review of Systems  Constitutional: Negative for chills and fever.  HENT: Negative for congestion and sore throat.   Eyes: Negative for blurred vision and pain.  Respiratory: Negative for cough and shortness of breath.   Cardiovascular: Negative for chest pain and palpitations.  Gastrointestinal: Positive for constipation. Negative for nausea.  Genitourinary: Negative for frequency.  Musculoskeletal: Positive for joint pain. Negative for myalgias.  Skin: Negative.   Neurological: Negative for tingling and tremors.  Endo/Heme/Allergies: Negative for polydipsia.       OBJECTIVE:  VS: BP (!) 146/88 (BP Location: Left Arm, Patient Position: Sitting, Cuff Size: Normal)    Pulse 80    Temp 98.7 F (37.1 C)    Ht '5\' 7"'$  (1.702 m)    Wt 205 lb 3.2 oz (93.1 kg)    SpO2 98%    BMI 32.14 kg/m    Wt Readings from Last 3 Encounters:  11/02/18 205 lb 3.2 oz (93.1 kg)  07/30/18 197 lb 1.6 oz (89.4 kg)  04/20/16 178 lb (80.7 kg)     EXAM: General: Pt appears well and is in NAD  Hydration: Well-hydrated with moist mucous membranes and good skin turgor  Eyes: External eye exam normal without stare, lid lag or exophthalmos.  EOM intact.   Ears, Nose, Throat: Hearing: Grossly intact bilaterally Dental: Good dentition  Throat: Clear without mass, erythema or exudate  Neck: General: Supple without adenopathy. Thyroid: Thyroid size normal.  No goiter or nodules appreciated. No thyroid bruit.  Lungs: Clear with good BS bilat with no rales, rhonchi, or wheezes  Heart: Auscultation: RRR.  Abdomen: Normoactive bowel sounds, soft, nontender, without masses or organomegaly palpable  Extremities:  BL LE: No pretibial edema normal ROM and strength.  Skin: Hair: Texture and  amount normal with gender appropriate distribution Skin Inspection: No rashes Skin Palpation: Skin temperature, texture, and thickness normal to palpation  Neuro: Cranial nerves: II - XII grossly intact Motor: Normal strength throughout DTRs: 2+ and symmetric in UE without delay in relaxation phase  Mental Status: Judgment, insight: Intact Orientation: Oriented to time, place, and person Mood and affect: No depression, anxiety, or agitation     DATA REVIEWED: 08/30/18  BUN/Cr 14/0.69 Alk Phos 314 (35-104) Liver 1% 17.2 (27.8-76.3%)                 58.1 (16.2-70.2) Liver2 %   0.0 Bone % 82.8 (19.1-67.7)  Bone 279.9     (12.1-42.7) IU/L  Intestinal %   0  AST 18 ALT 19   A1c 7.2%   GGT  29 (9-64) TSH 0.820 PTH 36 Vitamin D 18    Results for Vickie Little, Vickie Little (MRN [id number]) as of 11/04/2018 15:29  Ref. Range 11/02/2018 14:49 11/02/2018 14:49  Sodium Latest Ref Range: 135 - 145 mEq/L 134 (L)   Potassium Latest Ref Range: 3.5 - 5.1 mEq/L 4.0   Chloride Latest Ref Range: 96 - 112 mEq/L 99   CO2 Latest Ref Range: 19 - 32 mEq/L 27   Glucose Latest Ref Range: 70 - 99 mg/dL 187 (H)   BUN Latest Ref Range: 6 - 23 mg/dL 13   Creatinine Latest Ref Range: 0.40 - 1.20 mg/dL 0.58   Calcium Latest Ref Range: 8.7 - 10.2 mg/dL 9.5 9.6  Alkaline Phosphatase Latest Ref Range: 39 - 117 IU/L 279 (H) 292 (H)  Albumin Latest Ref Range: 3.5 - 5.2 g/dL 4.1   AST Latest Ref Range: 0 - 37 U/L 14   ALT Latest Ref Range: 0 - 35 U/L 18   Total Protein Latest Ref Range: 6.0 - 8.3 g/dL 7.0   Total Bilirubin Latest Ref Range: 0.2 - 1.2 mg/dL 0.7   GFR Latest Ref Range: >60.00 mL/min 132.52   BONE FRACTION Unknown  WILL FOLLOW  INTESTINAL FRAC. Unknown  WILL FOLLOW  LIVER FRACTION Unknown  WILL FOLLOW  VITD Latest Ref Range: 30.00 - 100.00 ng/mL 34.05   PTH, Intact Latest Ref Range: 15 - 65 pg/mL  19  PTH Interp Unknown  Comment  TSH Latest Ref Range: 0.35 - 4.50 uIU/mL 0.62    DXA 10/01/2018 AP  LUMBAR SPINE L1-L3  Bone Mineral Density (BMD): 0.988 g/cm2  Young Adult T-Score: -0.3  Z-Score: 0.5  LEFT FEMUR TOTAL  Bone Mineral Density (BMD): 0.791 g/cm2  Young Adult T-Score: -1.2  Z-Score: -0.7   ASSESSMENT/PLAN/RECOMMENDATIONS:   1. Elevated Alkaline Phosphatase:   - Elevated Alkaline phosphatase could be of hepatic, intestinal or bone origin. - In  this patient it is of bone origin based on labs results from PCP's office.  - Elevated Alkaline phosphatase of bone origin is due to increase osteoblastic activity.  - Causes include : hyperthyroidism, hyperparathyroidism, osteomalacia , recent fractures, paget's disease or familial causes.  - Based on her recent labs that exclude hyperparathyroidism and hyperthyroidism - DXA scan shows osteopenia - Will proceed with Bone scan to exclude metabolic bone disease.       2. Vitamin D Deficiency :   - Resolved  - Will reduce Vitamin D as below , she is currently on > 3000 iu daily of vitamin D in addition to spending 20-30 minutes in direct sun - Pt advised to avoid direct sun exposure due to increased risk of skin cancer.   Medications: Vitamin D 2000 iu daily    F/u in 3 months    Attempted to call the pt on 9/14 with no call back. Will sent a letter  Signed electronically by: Mack Guise, MD  [facility]  [facility] [address] [place], [place] [address] Phone: [phone] FAX: [phone]   CC: Bartholome Bill, [place]  [id number] Phone: [phone] Fax: [phone]   Return to Endocrinology clinic as below: Future Appointments  Date Time Provider [place]  12/24/2018  9:30 AM [facility]-ECHO/VAS 1 [facility]-IMG None  01/04/2019  2:30 PM Miquel Dunn, NP [facility]-PCV None

## 2018-11-02 NOTE — Patient Instructions (Signed)
-   Please stop by the lab today  

## 2018-11-04 ENCOUNTER — Encounter: Payer: Self-pay | Admitting: Internal Medicine

## 2018-11-05 ENCOUNTER — Encounter: Payer: Self-pay | Admitting: Internal Medicine

## 2018-11-05 ENCOUNTER — Telehealth: Payer: Self-pay | Admitting: Internal Medicine

## 2018-11-05 NOTE — Telephone Encounter (Signed)
Left a message for a call back   Abby Jaralla Shamleffer, MD  [facility]  [facility] [address] [place], [place] [address] Phone: [phone] FAX: [phone]  

## 2018-11-06 LAB — PTH, INTACT AND CALCIUM
Calcium: 9.6 mg/dL (ref 8.7–10.2)
PTH: 19 pg/mL (ref 15–65)

## 2018-11-06 LAB — ALKALINE PHOSPHATASE, ISOENZYMES
Alkaline Phosphatase: 292 IU/L — ABNORMAL HIGH (ref 39–117)
BONE FRACTION: 75 % — ABNORMAL HIGH (ref 14–68)
INTESTINAL FRAC.: 4 % (ref 0–18)
LIVER FRACTION: 21 % (ref 18–85)

## 2018-11-07 NOTE — Telephone Encounter (Signed)
Pt aware of results, pt had a few questions as to why she needed the bone scan, I explained to pt that all of that was discussed during visit and was also on AVS which was what I went over with her also. Pt stated that she had a better understanding after reviewing this info with her.

## 2018-11-07 NOTE — Telephone Encounter (Signed)
Patient is returning calling in regards to labs. States to please mobile number.

## 2018-11-20 ENCOUNTER — Telehealth: Payer: Self-pay | Admitting: Internal Medicine

## 2018-11-20 NOTE — Telephone Encounter (Signed)
Patient returning call - call back number is [phone]

## 2018-11-20 NOTE — Telephone Encounter (Signed)
Attempted to contact pt at # provided no answer, vm lft

## 2018-11-22 ENCOUNTER — Other Ambulatory Visit: Payer: Self-pay

## 2018-11-22 ENCOUNTER — Encounter (HOSPITAL_COMMUNITY)
Admission: RE | Admit: 2018-11-22 | Discharge: 2018-11-22 | Disposition: A | Discharge status: Home / Self Care | Payer: Managed Care, Other (non HMO) | Source: Ambulatory Visit | Attending: Internal Medicine | Admitting: Internal Medicine

## 2018-11-22 ENCOUNTER — Ambulatory Visit (HOSPITAL_COMMUNITY)
Admission: RE | Admit: 2018-11-22 | Discharge: 2018-11-22 | Disposition: A | Discharge status: Home / Self Care | Payer: Managed Care, Other (non HMO) | Source: Ambulatory Visit | Attending: Internal Medicine | Admitting: Internal Medicine

## 2018-11-22 DIAGNOSIS — R748 Abnormal levels of other serum enzymes: Secondary | ICD-10-CM

## 2018-11-22 MED ORDER — TECHNETIUM TC 99M MEDRONATE IV KIT
20.0000 | PACK | Freq: Once | INTRAVENOUS | Status: AC | PRN
  Administered 2018-11-22: 20 via INTRAVENOUS

## 2018-11-26 ENCOUNTER — Telehealth: Payer: Self-pay | Admitting: Internal Medicine

## 2018-11-26 NOTE — Telephone Encounter (Signed)
Pt did return the call

## 2018-11-27 ENCOUNTER — Encounter: Payer: Self-pay | Admitting: Internal Medicine

## 2018-11-28 NOTE — Telephone Encounter (Signed)
Letter sent to the pt after no return in call in 24 hrs

## 2018-12-24 ENCOUNTER — Other Ambulatory Visit: Payer: Managed Care, Other (non HMO)

## 2018-12-26 ENCOUNTER — Other Ambulatory Visit: Payer: Self-pay

## 2018-12-26 ENCOUNTER — Ambulatory Visit (INDEPENDENT_AMBULATORY_CARE_PROVIDER_SITE_OTHER): Payer: Managed Care, Other (non HMO)

## 2018-12-26 DIAGNOSIS — I739 Peripheral vascular disease, unspecified: Secondary | ICD-10-CM | POA: Diagnosis not present

## 2019-01-04 ENCOUNTER — Ambulatory Visit (INDEPENDENT_AMBULATORY_CARE_PROVIDER_SITE_OTHER): Payer: Managed Care, Other (non HMO) | Admitting: Cardiology

## 2019-01-04 ENCOUNTER — Encounter: Payer: Self-pay | Admitting: Cardiology

## 2019-01-04 ENCOUNTER — Other Ambulatory Visit: Payer: Self-pay

## 2019-01-04 VITALS — BP 153/90 | HR 83 | Ht 67.0 in | Wt 205.0 lb

## 2019-01-04 DIAGNOSIS — I739 Peripheral vascular disease, unspecified: Secondary | ICD-10-CM

## 2019-01-04 DIAGNOSIS — I10 Essential (primary) hypertension: Secondary | ICD-10-CM

## 2019-01-04 DIAGNOSIS — I2510 Atherosclerotic heart disease of native coronary artery without angina pectoris: Secondary | ICD-10-CM

## 2019-01-04 DIAGNOSIS — E1140 Type 2 diabetes mellitus with diabetic neuropathy, unspecified: Secondary | ICD-10-CM

## 2019-01-04 DIAGNOSIS — Z794 Long term (current) use of insulin: Secondary | ICD-10-CM

## 2019-01-04 MED ORDER — EZETIMIBE 10 MG PO TABS: 10.0000 mg | ORAL_TABLET | Freq: Every day | ORAL | 2 refills | Status: DC

## 2019-01-04 MED ORDER — AMLODIPINE BESYLATE 5 MG PO TABS: 5.0000 mg | ORAL_TABLET | Freq: Every day | ORAL | 2 refills | Status: DC

## 2019-01-04 NOTE — Progress Notes (Signed)
Primary Physician/Referring:  Verlon Au, MD  Patient ID: Vickie Little, female    DOB: March 12, 1965, 53 y.o.   MRN: [id number]  Chief Complaint  Patient presents with   PAD    5 month f/u   Coronary Artery Disease    HPI: Vickie Little  is a 53 y.o. female  with hypertension, former tobacco use, diabetes, hyperlipidemia, CAD with NSTEMI in 2013 and underwent PCI to LAD  at that time. She has severe peripheral artery disease and also claudication involving PAD and neurogenic lumbar claudication, symptoms of claudication had improved since being on Pletal.    She is here for 6 month follow up and to discuss recent ABI results. For the last 1 month has not had any significant leg pain. She has actually not even had to take Gabapentin. No chest pain or dyspnea on exertion.   States diabetes has not been as well controlled recently due to eating more due to being at home more. She was supposed to start Trulicity, but has decided to not start as of yet.  Past Medical History:  Diagnosis Date   Arthritis    CAD (coronary artery disease), native coronary artery 07/30/2018   Diabetes mellitus without complication (HCC)    type II    Heart attack (HCC)    Hypertension    NSTEMI (non-ST elevated myocardial infarction) (HCC) 02/08/2012    Past Surgical History:  Procedure Laterality Date   CORONARY ANGIOPLASTY WITH STENT PLACEMENT     LEFT HEART CATHETERIZATION WITH CORONARY ANGIOGRAM N/A 02/13/2012   Procedure: LEFT HEART CATHETERIZATION WITH CORONARY ANGIOGRAM;  Surgeon: Donato Schultz, MD;  Location: [facility] CATH LAB;  Service: Cardiovascular;  Laterality: N/A;   LUMBAR LAMINECTOMY/DECOMPRESSION MICRODISCECTOMY Left 04/20/2016   Procedure: 1. central decompression for spinal stenosis   1.central decompression for spinal stenosis L5-S1  2.foramionotomy L5 root and S1nerve root left       3. Microdisectomy L5-S1 left  ;  Surgeon: Ranee Gosselin, MD;  Location: [facility] ORS;   Service: Orthopedics;  Laterality: Left;    Social History   Socioeconomic History   Marital status: Married    Spouse name: Not on file   Number of children: 1   Years of education: Not on file   Highest education level: Not on file  Occupational History   Occupation: Magazine features editor: [employer]    Comment: [employer]  Social Needs   Physicist, medical strain: Not on file   Food insecurity    Worry: Not on file    Inability: Not on file   Transportation needs    Medical: Not on file    Non-medical: Not on file  Tobacco Use   Smoking status: Former Smoker    Packs/day: 0.00    Years: 20.00    Pack years: 0.00    Quit date: 08/22/2015    Years since quitting: 3.3   Smokeless tobacco: Never Used  Substance and Sexual Activity   Alcohol use: Yes    Comment: socially. 2-3 beers on weekend.   Drug use: No   Sexual activity: Yes  Lifestyle   Physical activity    Days per week: Not on file    Minutes per session: Not on file   Stress: Not on file  Relationships   Social connections    Talks on phone: Not on file    Gets together: Not on file    Attends religious service: Not on  file    Active member of club or organization: Not on file    Attends meetings of clubs or organizations: Not on file    Relationship status: Not on file   Intimate partner violence    Fear of current or ex partner: Not on file    Emotionally abused: Not on file    Physically abused: Not on file    Forced sexual activity: Not on file  Other Topics Concern   Not on file  Social History Narrative   Not on file    Review of Systems  Constitution: Negative for chills, decreased appetite, malaise/fatigue and weight gain.  Cardiovascular: Negative for chest pain, claudication, dyspnea on exertion, leg swelling and syncope.  Endocrine: Negative for cold intolerance and heat intolerance.  Hematologic/Lymphatic: Does not bruise/bleed easily.    Musculoskeletal: Negative for joint swelling and muscle weakness.  Gastrointestinal: Negative for abdominal pain, anorexia, change in bowel habit, hematochezia and melena.  Neurological: Negative for headaches, light-headedness, numbness and paresthesias.  Psychiatric/Behavioral: Negative for depression and substance abuse.  All other systems reviewed and are negative.     Objective  Blood pressure (!) 153/90, pulse 83, height 5\' 7"  (1.702 m), weight 205 lb (93 kg), SpO2 98 %. Body mass index is 32.11 kg/m.    Physical Exam  Constitutional: She appears well-developed and well-nourished. No distress.  HENT:  Head: Atraumatic.  Eyes: Conjunctivae are normal.  Neck: Neck supple. No JVD present. No thyromegaly present.  Cardiovascular: Normal rate, regular rhythm, normal heart sounds and intact distal pulses. Exam reveals no gallop.  No murmur heard. Pulses:      Carotid pulses are on the right side with bruit and on the left side with bruit.      Femoral pulses are 2+ on the right side and 2+ on the left side.      Popliteal pulses are 1+ on the right side and 1+ on the left side.       Dorsalis pedis pulses are 1+ on the right side and 0 on the left side.       Posterior tibial pulses are 0 on the right side and 1+ on the left side.  No edema   Pulmonary/Chest: Effort normal and breath sounds normal.  Abdominal: Soft. Bowel sounds are normal.  Musculoskeletal: Normal range of motion.  Neurological: She is alert.  Skin: Skin is warm and dry.  Psychiatric: She has a normal mood and affect.   Radiology: No results found.  Laboratory examination:    CMP Latest Ref Rng & Units 11/02/2018 11/02/2018 04/15/2016  Glucose 70 - 99 mg/dL - 187(H) 227(H)  BUN 6 - 23 mg/dL - 13 11  Creatinine 0.40 - 1.20 mg/dL - 0.58 0.67  Sodium 135 - 145 mEq/L - 134(L) 135  Potassium 3.5 - 5.1 mEq/L - 4.0 3.9  Chloride 96 - 112 mEq/L - 99 102  CO2 19 - 32 mEq/L - 27 25  Calcium 8.7 - 10.2 mg/dL 9.6  9.5 9.8  Total Protein 6.0 - 8.3 g/dL - 7.0 8.1  Total Bilirubin 0.2 - 1.2 mg/dL - 0.7 0.9  Alkaline Phos 39 - 117 IU/L 292(H) 279(H) 245(H)  AST 0 - 37 U/L - 14 17  ALT 0 - 35 U/L - 18 25   CBC Latest Ref Rng & Units 04/15/2016 01/23/2016 12/14/2014  WBC 4.0 - 10.5 K/uL 5.9 - 6.1  Hemoglobin 12.0 - 15.0 g/dL 12.3 13.6 13.5  Hematocrit 36.0 - 46.0 %  36.6 40.0 40.0  Platelets 150 - 400 K/uL 286 - -   Lipid Panel     Component Value Date/Time   CHOL 160 08/04/2014 1437   TRIG 107 08/04/2014 1437   HDL 49 08/04/2014 1437   CHOLHDL 3.3 08/04/2014 1437   VLDL 21 08/04/2014 1437   LDLCALC 90 08/04/2014 1437   HEMOGLOBIN A1C Lab Results  Component Value Date   HGBA1C 10.5 (H) 04/15/2016   MPG 255 04/15/2016   TSH Recent Labs    11/02/18 1449  TSH 0.62    PRN Meds:. Medications Discontinued During This Encounter  Medication Reason   amLODipine (NORVASC) 10 MG tablet Error   aspirin EC 325 MG tablet Error   gabapentin (NEURONTIN) 300 MG capsule Error   Current Meds  Medication Sig   aspirin EC 81 MG tablet Take 81 mg by mouth daily.   atorvastatin (LIPITOR) 20 MG tablet Take 1 tablet (20 mg total) by mouth daily.   cholecalciferol (VITAMIN D3) 25 MCG (1000 UT) tablet Take 1,000 Units by mouth daily.   cilostazol (PLETAL) 50 MG tablet TAKE 1 TABLET BY MOUTH TWICE DAILY   docusate sodium (COLACE) 100 MG capsule Take 1 capsule (100 mg total) by mouth 2 (two) times daily.   empagliflozin (JARDIANCE) 25 MG TABS tablet Take 1 tablet by mouth daily.   Insulin Glargine (BASAGLAR KWIKPEN) 100 UNIT/ML SOPN Inject 25 Units into the skin daily. Patient takes in am   metFORMIN (GLUCOPHAGE) 500 MG tablet Take 1,000 mg by mouth 2 (two) times daily with a meal.    Multiple Vitamin (MULTIVITAMIN WITH MINERALS) TABS tablet Take 1 tablet by mouth daily. Hasn't had in a week   Current Facility-Administered Medications for the 01/04/19 encounter (Office Visit) with Toniann FailKelley, Foster Sonnier  Haynes, NP  Medication   gi cocktail (Maalox,Lidocaine,Donnatal)    Cardiac Studies:   Study Result  ABI 12/26/2018: This exam reveals mildly decreased perfusion of the right lower extremity, noted at the dorsalis pedis artery level (ABI 0.81). This exam reveals moderately decreased perfusion of the left lower extremity, noted at the dorsalis pedis artery level (ABI 0.69).  Compared to lower extremity arterial duplex/ABI done on 01/10/2018, no significant change.    Coronary angiogram 02/13/2012:  Mid LAD Overlapping 3.0 x 16 mm and2.75 x 38 mm Promus DES.  Diagonal vessels with ostial 95% stenosis, small calibered.  Mild disease in circumflex and right coronary artery.  EF 55%.  Lexiscan sestamibi stress test 04/11/2016: 1. The resting electrocardiogram demonstrated normal sinus rhythm, normal resting conduction, no resting arrhythmias and normal rest repolarization.  Low voltage and poor R progression. Stress EKG is non-diagnostic for ischemia as it a pharmacologic stress using Lexiscan. Stress symptoms included dyspnea. 2. Myocardial perfusion imaging is normal. Overall left ventricular systolic function was normal without regional wall motion abnormalities. The left ventricular ejection fraction was 66%.  This is a low risk study.  Lower extremity arterial duplex 01/10/2018: Diffuse calcific plaque bilaterally. Biphasic waveform right lower extremity without high grade stenosis. Right PT appears occluded. Left proximal SFA >50% stenosis with calcific plaque.  This exam reveals mildly decreased perfusion of the right lower extremity, noted at the dorsalis pedis and post tibial artery level (ABI 0.86) and moderately decreased perfusion of the left lower extremity, noted at the dorsalis pedis and post tibial artery level (ABI 0.68).  Compared to the study done on 05/24/2016, left SFA stenosis is new.  Otherwise no significant change.  Carotid artery duplex 01/10/2018: Stenosis  in the  bilateral external carotid artery (<50%) with mild heterogeneous plaque. Source of bruit. Antegrade right vertebral artery flow. Antegrade left vertebral artery flow. Follow up studies when clinically indicated.   Assessment   Coronary artery disease involving native coronary artery of native heart without angina pectoris - Plan: EKG 12-Lead  PAD (peripheral artery disease) (HCC)  Primary hypertension  Type 2 diabetes mellitus with diabetic neuropathy, with long-term current use of insulin (HCC)  EKG 07/30/2018: Normal sinus rhythm at 81 bpm, left atrial enlargement, normal axis, anterior infarct old. No evidence of ischemia.   Recommendations:   Patient is here on 46-month follow-up visit for PAD.  By recent ABI, PAD has been stable.  She is not having any symptoms of claudication.  We will continue with Pletal.  She also likely has some diabetic neuropathy, but is actually not had to use any gabapentin for the last 1 month.  Her diabetes does appear to be uncontrolled, she is closely being followed by Dr. Leavy Cella for this.  I reviewed her recent lipids, LDL continues to be not at goal of less than.  I will add Zetia 10 mg daily.  She is to see Dr. Leavy Cella in February, will request lipid panel be repeated at that time for follow-up.  No symptoms of angina or clinical evidence of heart failure.  Her blood pressure is elevated again today.  I will start amlodipine 5 mg daily.  Encouraged her to continue with daily walking and also needed diet modifications to help with better diabetes control and also hypertension.  I will see her back in 6 weeks for follow-up on hypertension.  Toniann Fail, MSN, APRN, FNP-C [facility] Office: [phone] Fax: [phone]

## 2019-02-18 ENCOUNTER — Ambulatory Visit: Payer: Managed Care, Other (non HMO) | Admitting: Cardiology

## 2019-02-21 ENCOUNTER — Ambulatory Visit: Payer: Managed Care, Other (non HMO) | Admitting: Cardiology

## 2019-02-28 ENCOUNTER — Ambulatory Visit: Payer: BC Managed Care – PPO | Admitting: Cardiology

## 2019-02-28 ENCOUNTER — Encounter: Payer: Self-pay | Admitting: Cardiology

## 2019-02-28 ENCOUNTER — Other Ambulatory Visit: Payer: Self-pay

## 2019-02-28 VITALS — BP 146/86 | HR 78 | Temp 97.5°F | Ht 67.0 in | Wt 206.0 lb

## 2019-02-28 DIAGNOSIS — I739 Peripheral vascular disease, unspecified: Secondary | ICD-10-CM | POA: Diagnosis not present

## 2019-02-28 DIAGNOSIS — I10 Essential (primary) hypertension: Secondary | ICD-10-CM | POA: Diagnosis not present

## 2019-02-28 DIAGNOSIS — I2510 Atherosclerotic heart disease of native coronary artery without angina pectoris: Secondary | ICD-10-CM

## 2019-02-28 DIAGNOSIS — E7800 Pure hypercholesterolemia, unspecified: Secondary | ICD-10-CM

## 2019-02-28 MED ORDER — AMLODIPINE BESYLATE 10 MG PO TABS: 10.0000 mg | ORAL_TABLET | Freq: Every day | ORAL | 2 refills | Status: DC

## 2019-02-28 NOTE — Progress Notes (Addendum)
Primary Physician/Referring:  Verlon Au, MD  Patient ID: Vickie Little, female    DOB: 1965/03/20, 54 y.o.   MRN: [id number]  Chief Complaint  Patient presents with   Hypertension   Coronary Artery Disease   Follow-up    6 week    HPI: Vickie Little  is a 54 y.o. female  with hypertension, former tobacco use, diabetes, hyperlipidemia, CAD with NSTEMI in 2013 and underwent PCI to LAD  at that time. She has severe peripheral artery disease and also claudication involving PAD and neurogenic lumbar claudication, symptoms of claudication had improved since being on Pletal.    She is here on 6 week follow up for hypertension. Overall feeling well. She is tolerating amlodipine and zetia well without side effects. She has noted that her blood pressure has improved, but is still slightly elevated. She is working to try to lose weight, but is frustrated with not having any significant success.   Diabetes is better controlled. She was supposed to start Trulicity, but has decided to not start as of yet.  Past Medical History:  Diagnosis Date   Arthritis    CAD (coronary artery disease), native coronary artery 07/30/2018   Diabetes mellitus without complication (HCC)    type II    Heart attack (HCC)    Hypertension    NSTEMI (non-ST elevated myocardial infarction) (HCC) 02/08/2012    Past Surgical History:  Procedure Laterality Date   CORONARY ANGIOPLASTY WITH STENT PLACEMENT     LEFT HEART CATHETERIZATION WITH CORONARY ANGIOGRAM N/A 02/13/2012   Procedure: LEFT HEART CATHETERIZATION WITH CORONARY ANGIOGRAM;  Surgeon: Donato Schultz, MD;  Location: [facility] CATH LAB;  Service: Cardiovascular;  Laterality: N/A;   LUMBAR LAMINECTOMY/DECOMPRESSION MICRODISCECTOMY Left 04/20/2016   Procedure: 1. central decompression for spinal stenosis   1.central decompression for spinal stenosis L5-S1  2.foramionotomy L5 root and S1nerve root left       3. Microdisectomy L5-S1 left  ;   Surgeon: Ranee Gosselin, MD;  Location: [facility];  Service: Orthopedics;  Laterality: Left;    Social History   Socioeconomic History   Marital status: Married    Spouse name: Not on file   Number of children: 1   Years of education: Not on file   Highest education level: Not on file  Occupational History   Occupation: Magazine features editor: [employer]    Comment: [employer]  Tobacco Use   Smoking status: Former Smoker    Packs/day: 0.00    Years: 20.00    Pack years: 0.00    Quit date: 08/22/2015    Years since quitting: 3.5   Smokeless tobacco: Never Used  Substance and Sexual Activity   Alcohol use: Yes    Comment: socially. 2-3 beers on weekend.   Drug use: No   Sexual activity: Yes  Other Topics Concern   Not on file  Social History Narrative   Not on file   Social Determinants of Health   Financial Resource Strain:    Difficulty of Paying Living Expenses: Not on file  Food Insecurity:    Worried About Running Out of Food in the Last Year: Not on file   Ran Out of Food in the Last Year: Not on file  Transportation Needs:    Lack of Transportation (Medical): Not on file   Lack of Transportation (Non-Medical): Not on file  Physical Activity:    Days of Exercise per Week: Not on file  Minutes of Exercise per Session: Not on file  Stress:    Feeling of Stress : Not on file  Social Connections:    Frequency of Communication with Friends and Family: Not on file   Frequency of Social Gatherings with Friends and Family: Not on file   Attends Religious Services: Not on file   Active Member of Clubs or Organizations: Not on file   Attends Banker Meetings: Not on file   Marital Status: Not on file  Intimate Partner Violence:    Fear of Current or Ex-Partner: Not on file   Emotionally Abused: Not on file   Physically Abused: Not on file   Sexually Abused: Not on file    Review of Systems   Constitution: Negative for chills, decreased appetite, malaise/fatigue and weight gain.  Cardiovascular: Negative for chest pain, claudication, dyspnea on exertion, leg swelling and syncope.  Endocrine: Negative for cold intolerance and heat intolerance.  Hematologic/Lymphatic: Does not bruise/bleed easily.  Musculoskeletal: Negative for joint swelling and muscle weakness.  Gastrointestinal: Negative for abdominal pain, anorexia, change in bowel habit, hematochezia and melena.  Neurological: Negative for headaches, light-headedness, numbness and paresthesias.  Psychiatric/Behavioral: Negative for depression and substance abuse.  All other systems reviewed and are negative.     Objective  Blood pressure (!) 146/86, pulse 78, temperature (!) 97.5 F (36.4 C), height 5\' 7"  (1.702 m), weight 206 lb (93.4 kg), SpO2 98 %. Body mass index is 32.26 kg/m.    Physical Exam  Constitutional: She appears well-developed and well-nourished. No distress.  HENT:  Head: Atraumatic.  Eyes: Conjunctivae are normal.  Neck: No JVD present. No thyromegaly present.  Cardiovascular: Normal rate, regular rhythm, normal heart sounds and intact distal pulses. Exam reveals no gallop.  No murmur heard. Pulses:      Carotid pulses are on the right side with bruit and on the left side with bruit.      Femoral pulses are 2+ on the right side and 2+ on the left side.      Popliteal pulses are 1+ on the right side and 1+ on the left side.       Dorsalis pedis pulses are 1+ on the right side and 0 on the left side.       Posterior tibial pulses are 0 on the right side and 1+ on the left side.  No edema   Pulmonary/Chest: Effort normal and breath sounds normal.  Abdominal: Soft. Bowel sounds are normal.  Musculoskeletal:        General: Normal range of motion.     Cervical back: Neck supple.  Neurological: She is alert.  Skin: Skin is warm and dry.  Psychiatric: She has a normal mood and affect.    Radiology: No results found.  Laboratory examination:    CMP Latest Ref Rng & Units 11/02/2018 11/02/2018 04/15/2016  Glucose 70 - 99 mg/dL - 04/17/2016) 462(M)  BUN 6 - 23 mg/dL - 13 11  Creatinine 638(T - 1.20 mg/dL - 7.71 1.65  Sodium 7.90 - 145 mEq/L - 134(L) 135  Potassium 3.5 - 5.1 mEq/L - 4.0 3.9  Chloride 96 - 112 mEq/L - 99 102  CO2 19 - 32 mEq/L - 27 25  Calcium 8.7 - 10.2 mg/dL 9.6 9.5 9.8  Total Protein 6.0 - 8.3 g/dL - 7.0 8.1  Total Bilirubin 0.2 - 1.2 mg/dL - 0.7 0.9  Alkaline Phos 39 - 117 IU/L 292(H) 279(H) 245(H)  AST 0 - 37 U/L -  14 17  ALT 0 - 35 U/L - 18 25   CBC Latest Ref Rng & Units 04/15/2016 01/23/2016 12/14/2014  WBC 4.0 - 10.5 K/uL 5.9 - 6.1  Hemoglobin 12.0 - 15.0 g/dL 12.3 13.6 13.5  Hematocrit 36.0 - 46.0 % 36.6 40.0 40.0  Platelets 150 - 400 K/uL 286 - -   Lipid Panel     Component Value Date/Time   CHOL 160 08/04/2014 1437   TRIG 107 08/04/2014 1437   HDL 49 08/04/2014 1437   CHOLHDL 3.3 08/04/2014 1437   VLDL 21 08/04/2014 1437   LDLCALC 90 08/04/2014 1437   HEMOGLOBIN A1C Lab Results  Component Value Date   HGBA1C 10.5 (H) 04/15/2016   MPG 255 04/15/2016   TSH Recent Labs    11/02/18 1449  TSH 0.62    PRN Meds:. Medications Discontinued During This Encounter  Medication Reason   docusate sodium (COLACE) 100 MG capsule Error   amLODipine (NORVASC) 5 MG tablet Discontinued by provider   Current Meds  Medication Sig   aspirin EC 81 MG tablet Take 81 mg by mouth daily.   atorvastatin (LIPITOR) 20 MG tablet Take 1 tablet (20 mg total) by mouth daily.   cholecalciferol (VITAMIN D3) 25 MCG (1000 UT) tablet Take 1,000 Units by mouth daily.   cilostazol (PLETAL) 50 MG tablet TAKE 1 TABLET BY MOUTH TWICE DAILY   empagliflozin (JARDIANCE) 25 MG TABS tablet Take 1 tablet by mouth daily.   ezetimibe (ZETIA) 10 MG tablet Take 1 tablet (10 mg total) by mouth daily.   Insulin Glargine (BASAGLAR KWIKPEN) 100 UNIT/ML SOPN Inject 25  Units into the skin daily. Patient takes in am   metFORMIN (GLUCOPHAGE) 500 MG tablet Take 1,000 mg by mouth 2 (two) times daily with a meal.    Multiple Vitamin (MULTIVITAMIN WITH MINERALS) TABS tablet Take 1 tablet by mouth daily. Hasn't had in a week   [DISCONTINUED] amLODipine (NORVASC) 5 MG tablet Take 1 tablet (5 mg total) by mouth daily.   [DISCONTINUED] docusate sodium (COLACE) 100 MG capsule Take 1 capsule (100 mg total) by mouth 2 (two) times daily.   Current Facility-Administered Medications for the 02/28/19 encounter (Office Visit) with Miquel Dunn, NP  Medication   gi cocktail (Maalox,Lidocaine,Donnatal)    Cardiac Studies:   Study Result  ABI 12/26/2018: This exam reveals mildly decreased perfusion of the right lower extremity, noted at the dorsalis pedis artery level (ABI 0.81). This exam reveals moderately decreased perfusion of the left lower extremity, noted at the dorsalis pedis artery level (ABI 0.69).  Compared to lower extremity arterial duplex/ABI done on 01/10/2018, no significant change.    Coronary angiogram 02/13/2012:  Mid LAD Overlapping 3.0 x 16 mm and2.75 x 38 mm Promus DES.  Diagonal vessels with ostial 95% stenosis, small calibered.  Mild disease in circumflex and right coronary artery.  EF 55%.  Lexiscan sestamibi stress test 04/11/2016: 1. The resting electrocardiogram demonstrated normal sinus rhythm, normal resting conduction, no resting arrhythmias and normal rest repolarization.  Low voltage and poor R progression. Stress EKG is non-diagnostic for ischemia as it a pharmacologic stress using Lexiscan. Stress symptoms included dyspnea. 2. Myocardial perfusion imaging is normal. Overall left ventricular systolic function was normal without regional wall motion abnormalities. The left ventricular ejection fraction was 66%.  This is a low risk study.  Lower extremity arterial duplex 01/10/2018: Diffuse calcific plaque bilaterally. Biphasic  waveform right lower extremity without high grade stenosis. Right PT appears occluded. Left  proximal SFA >50% stenosis with calcific plaque.  This exam reveals mildly decreased perfusion of the right lower extremity, noted at the dorsalis pedis and post tibial artery level (ABI 0.86) and moderately decreased perfusion of the left lower extremity, noted at the dorsalis pedis and post tibial artery level (ABI 0.68).  Compared to the study done on 05/24/2016, left SFA stenosis is new.  Otherwise no significant change.  Carotid artery duplex 01/10/2018: Stenosis in the bilateral external carotid artery (<50%) with mild heterogeneous plaque. Source of bruit. Antegrade right vertebral artery flow. Antegrade left vertebral artery flow. Follow up studies when clinically indicated.   Assessment   Primary hypertension  Hypercholesteremia  Coronary artery disease involving native coronary artery of native heart without angina pectoris  PAD (peripheral artery disease) (HCC)  EKG 07/30/2018: Normal sinus rhythm at 81 bpm, left atrial enlargement, normal axis, anterior infarct old. No evidence of ischemia.   Recommendations:   Patient is here for 6 week follow up for HTN. Blood pressure has improved with amlodipine, but does continue to be slightly elevated. Will increase amlodipine to 10 mg daily. Advised her to notify me for any leg swelling. She has not had any chest pain or dyspnea. Symptoms of claudication overall continue to be significantly better with Pletal. Will continue the same.   She is to follow up with her PCP in Feb and will have lipids performed at that time. Continue with Zetia and Lipitor 20 mg daily. I will see her back in 8 weeks for follow up on HTN.   Toniann Fail, MSN, APRN, FNP-C [facility] Office: [phone] Fax: [phone]

## 2019-03-13 MED ORDER — CILOSTAZOL 50 MG PO TABS: 50.0000 mg | ORAL_TABLET | Freq: Two times a day (BID) | ORAL | 6 refills | Status: DC

## 2019-03-13 NOTE — Addendum Note (Signed)
Addended by: Suzy Bouchard on: 03/13/2019 08:02 AM   Modules accepted: Orders

## 2019-03-23 ENCOUNTER — Other Ambulatory Visit: Payer: Self-pay | Admitting: Cardiology

## 2019-04-27 ENCOUNTER — Ambulatory Visit: Payer: Self-pay | Attending: Internal Medicine

## 2019-04-27 DIAGNOSIS — Z23 Encounter for immunization: Secondary | ICD-10-CM | POA: Insufficient documentation

## 2019-04-27 NOTE — Progress Notes (Signed)
   Covid-19 Vaccination Clinic  Name:  Vickie Little    MRN: [id number] DOB: November 20, 1965  04/27/2019  Vickie Little was observed post Covid-19 immunization for 15 minutes without incident. She was provided with Vaccine Information Sheet and instruction to access the V-Safe system.   Vickie Little was instructed to call 911 with any severe reactions post vaccine: Marland Kitchen Difficulty breathing  . Swelling of face and throat  . A fast heartbeat  . A bad rash all over body  . Dizziness and weakness   Immunizations Administered    Name Date Dose VIS Date Route   Pfizer COVID-19 Vaccine 04/27/2019  1:08 PM 0.3 mL 02/01/2019 Intramuscular   Manufacturer: [employer], [employer]   Lot: [id number]   NDC: [address]-4

## 2019-04-29 ENCOUNTER — Ambulatory Visit: Payer: Managed Care, Other (non HMO) | Admitting: Cardiology

## 2019-05-06 ENCOUNTER — Ambulatory Visit: Payer: BC Managed Care – PPO | Admitting: Cardiology

## 2019-05-18 ENCOUNTER — Ambulatory Visit: Payer: Self-pay | Attending: Internal Medicine

## 2019-05-18 DIAGNOSIS — Z23 Encounter for immunization: Secondary | ICD-10-CM

## 2019-05-18 NOTE — Progress Notes (Signed)
   Covid-19 Vaccination Clinic  Name:  ZAURIA DOMBEK    MRN: [id number] DOB: 1965-08-05  05/18/2019  Ms. Stacks was observed post Covid-19 immunization for 15 minutes without incident. She was provided with Vaccine Information Sheet and instruction to access the V-Safe system.   Ms. Bryner was instructed to call 911 with any severe reactions post vaccine: Marland Kitchen Difficulty breathing  . Swelling of face and throat  . A fast heartbeat  . A bad rash all over body  . Dizziness and weakness   Immunizations Administered    Name Date Dose VIS Date Route   Pfizer COVID-19 Vaccine 05/18/2019  1:25 PM 0.3 mL 02/01/2019 Intramuscular   Manufacturer: [employer], [employer]   Lot: [id number]   NDC: [address]-3

## 2019-05-29 ENCOUNTER — Other Ambulatory Visit: Payer: Self-pay | Admitting: Cardiology

## 2019-06-22 ENCOUNTER — Other Ambulatory Visit: Payer: Self-pay | Admitting: Cardiology

## 2019-10-11 IMAGING — NM NM BONE WHOLE BODY
2 series · 2 of 2 positions shown · non-contrast
Comparison: None

Radiographic correlation: Lumbar myelogram 03/22/2016

CLINICAL DATA: Elevated alkaline phosphatase, history coronary
artery disease post NSTEMI, type II diabetes mellitus, hypertension

EXAM:
NUCLEAR MEDICINE WHOLE BODY BONE SCAN
TECHNIQUE: Whole body anterior and posterior images were obtained approximately
3 hours after intravenous injection of radiopharmaceutical.
RADIOPHARMACEUTICALS:  At 20.0 mCi Vechnetium-AAm MDP IV

[Series 1: whole body · 2.66mm/px · 1 of 1 slices shown (1 of 2)]
[im 1/1]
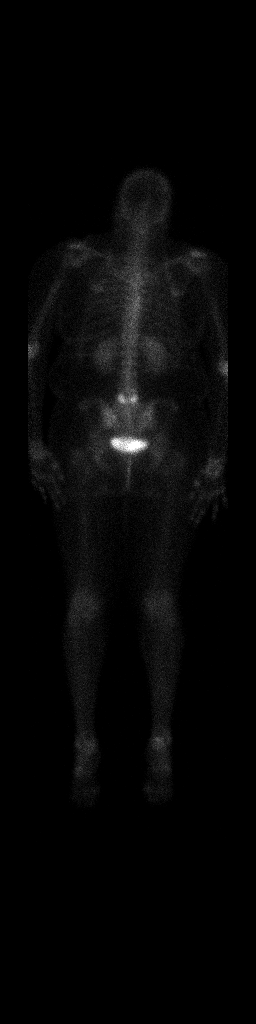

[Series 1: whole body · 2.66mm/px · 1 of 1 slices shown (2 of 2)]
[im 1/1]
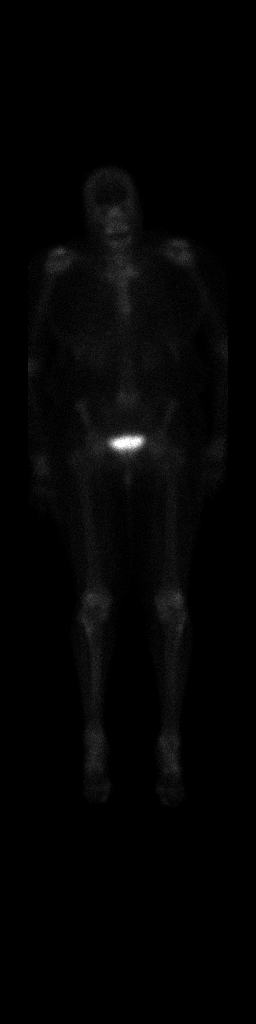

[2 of 2 positions shown; findings below may reference images not displayed]

FINDINGS: Uptake at the shoulders, sternoclavicular joints, elbows, wrists,
and knees, typically degenerative.

Uptake bilaterally at the lower lumbar spine, approximately L4-L5,
likely degenerative; this roughly corresponds with facet
degenerative changes on prior CT.

No additional sites of worrisome osseous tracer accumulation are
identified.

Expected urinary tract and soft tissue distribution of tracer.
IMPRESSION: Scattered areas of degenerative type uptake as above.

Otherwise negative exam.

Specifically, no scintigraphic evidence of osseous metastatic
disease or Paget's disease.

## 2019-11-12 ENCOUNTER — Other Ambulatory Visit: Payer: Self-pay

## 2020-06-09 ENCOUNTER — Other Ambulatory Visit: Payer: Self-pay

## 2020-06-11 ENCOUNTER — Other Ambulatory Visit: Payer: Self-pay

## 2020-06-17 ENCOUNTER — Other Ambulatory Visit: Payer: Self-pay

## 2020-06-17 ENCOUNTER — Encounter: Payer: Self-pay | Admitting: Student

## 2020-06-17 ENCOUNTER — Ambulatory Visit: Payer: BC Managed Care – PPO | Admitting: Student

## 2020-06-17 VITALS — BP 149/91 | HR 79 | Temp 98.3°F | Ht 67.0 in | Wt 186.0 lb

## 2020-06-17 DIAGNOSIS — I2510 Atherosclerotic heart disease of native coronary artery without angina pectoris: Secondary | ICD-10-CM

## 2020-06-17 DIAGNOSIS — I10 Essential (primary) hypertension: Secondary | ICD-10-CM

## 2020-06-17 DIAGNOSIS — E7800 Pure hypercholesterolemia, unspecified: Secondary | ICD-10-CM

## 2020-06-17 DIAGNOSIS — Z794 Long term (current) use of insulin: Secondary | ICD-10-CM

## 2020-06-17 DIAGNOSIS — I739 Peripheral vascular disease, unspecified: Secondary | ICD-10-CM

## 2020-06-17 MED ORDER — VALSARTAN 40 MG PO TABS: 40.0000 mg | ORAL_TABLET | Freq: Every day | ORAL | 3 refills | Status: DC

## 2020-06-17 MED ORDER — CILOSTAZOL 50 MG PO TABS: 50.0000 mg | ORAL_TABLET | Freq: Two times a day (BID) | ORAL | 3 refills | Status: DC

## 2020-06-17 NOTE — Patient Instructions (Signed)
Blood work in 1 week at [facility]

## 2020-06-17 NOTE — Progress Notes (Signed)
Primary Physician/Referring:  Bartholome Bill, MD  Patient ID: Vickie Little, female    DOB: 10/30/1965, 55 y.o.   MRN: [id number]  Chief Complaint  Patient presents with  . Hypertension  . Coronary Artery Disease  . Medication Refill  . Follow-up    HPI: Vickie Little  is a 55 y.o. female  with hypertension, former tobacco use, diabetes, hyperlipidemia, CAD with NSTEMI in 2013 and underwent PCI to LAD  at that time. She has severe peripheral artery disease and also claudication involving PAD and neurogenic lumbar claudication, symptoms of claudication had improved since being on Pletal.    Patient last seen in our office on 02/28/2019 by Binnie Kand, FNP.  At that visit increased amlodipine to 10 mg daily and recommended 8-week follow-up of hypertension.  Unfortunately patient was lost to follow-up until now.  Patient now presents for follow-up of hypertension, hyperlipidemia, CAD, and PAD.  Since last visit patient has discontinued amlodipine.  She has also been without her Pletal for the last 3 weeks.  Patient states overall she is feeling well.  Denies chest pain, palpitations, syncope, near syncope, dyspnea or claudication.  At last visit with PCP patient's A1c was 8.0%.  Notably patient has known history of CAD, however she is not on beta-blocker or ARB/ACE therapy.  I thoroughly reviewed her chart identified no contraindication to either of these medications.  It is unclear why patient is not on beta-blocker or ARB therapy.  Patient currently is active at work and walks approximately 30 minutes 2-3 times per week around her neighborhood without issue.  She quit smoking several years ago, however she started vaping over the last several months.  Past Medical History:  Diagnosis Date  . Arthritis   . CAD (coronary artery disease), native coronary artery 07/30/2018  . Diabetes mellitus without complication ([place])    type II   . Heart attack ([place])   . Hypertension   .  NSTEMI (non-ST elevated myocardial infarction) ([place]) 02/08/2012   Past Surgical History:  Procedure Laterality Date  . CORONARY ANGIOPLASTY WITH STENT PLACEMENT    . LEFT HEART CATHETERIZATION WITH CORONARY ANGIOGRAM N/A 02/13/2012   Procedure: LEFT HEART CATHETERIZATION WITH CORONARY ANGIOGRAM;  Surgeon: Candee Furbish, MD;  Location: [facility] CATH LAB;  Service: Cardiovascular;  Laterality: N/A;  . LUMBAR LAMINECTOMY/DECOMPRESSION MICRODISCECTOMY Left 04/20/2016   Procedure: 1. central decompression for spinal stenosis   1.central decompression for spinal stenosis L5-S1  2.foramionotomy L5 root and S1nerve root left       3. Microdisectomy L5-S1 left  ;  Surgeon: Latanya Maudlin, MD;  Location: [facility] ORS;  Service: Orthopedics;  Laterality: Left;   Family History  Adopted: Yes  Problem Relation Age of Onset  . Diabetes Father    Social History   Tobacco Use  . Smoking status: Former Smoker    Packs/day: 0.00    Years: 20.00    Pack years: 0.00    Quit date: 08/22/2015    Years since quitting: 4.8  . Smokeless tobacco: Never Used  Substance Use Topics  . Alcohol use: Yes    Comment: socially. 2-3 beers on weekend.  Marital Status: Married  ROS   Review of Systems  Constitutional: Negative for malaise/fatigue and weight gain.  Cardiovascular: Negative for chest pain, claudication, leg swelling, near-syncope, orthopnea, palpitations, paroxysmal nocturnal dyspnea and syncope.  Respiratory: Negative for shortness of breath.   Hematologic/Lymphatic: Does not bruise/bleed easily.  Gastrointestinal: Negative for melena.  Neurological: Negative for  dizziness and weakness.      Objective  Blood pressure (!) 149/91, pulse 79, temperature 98.3 F (36.8 C), height _0  (1.702 m), weight 186 lb (84.4 kg), SpO2 99 %. Body mass index is 29.13 kg/m.    Physical Exam Constitutional:      General: She is not in acute distress.    Appearance: She is well-developed.  Neck:     Thyroid: No  thyromegaly.     Vascular: No JVD.  Cardiovascular:     Rate and Rhythm: Normal rate and regular rhythm.     Pulses: Intact distal pulses.          Carotid pulses are on the right side with bruit and on the left side with bruit.      Femoral pulses are 2+ on the right side and 2+ on the left side.      Popliteal pulses are 1+ on the right side and 1+ on the left side.       Dorsalis pedis pulses are 1+ on the right side and 0 on the left side.       Posterior tibial pulses are 0 on the right side and 1+ on the left side.     Heart sounds: Normal heart sounds. No murmur heard. No gallop.   Pulmonary:     Effort: Pulmonary effort is normal.     Breath sounds: Normal breath sounds. No wheezing or rales.  Abdominal:     General: Bowel sounds are normal.     Palpations: Abdomen is soft.  Musculoskeletal:     Cervical back: Neck supple.     Right lower leg: No edema.     Left lower leg: No edema.  Skin:    General: Skin is warm and dry.  Neurological:     Mental Status: She is alert.    Laboratory examination:    CMP Latest Ref Rng & Units 11/02/2018 11/02/2018 04/15/2016  Glucose 70 - 99 mg/dL - 187(H) 227(H)  BUN 6 - 23 mg/dL - 13 11  Creatinine 0.40 - 1.20 mg/dL - 0.58 0.67  Sodium 135 - 145 mEq/L - 134(L) 135  Potassium 3.5 - 5.1 mEq/L - 4.0 3.9  Chloride 96 - 112 mEq/L - 99 102  CO2 19 - 32 mEq/L - 27 25  Calcium 8.7 - 10.2 mg/dL 9.6 9.5 9.8  Total Protein 6.0 - 8.3 g/dL - 7.0 8.1  Total Bilirubin 0.2 - 1.2 mg/dL - 0.7 0.9  Alkaline Phos 39 - 117 IU/L 292(H) 279(H) 245(H)  AST 0 - 37 U/L - 14 17  ALT 0 - 35 U/L - 18 25   CBC Latest Ref Rng & Units 04/15/2016 01/23/2016 12/14/2014  WBC 4.0 - 10.5 K/uL 5.9 - 6.1  Hemoglobin 12.0 - 15.0 g/dL 12.3 13.6 13.5  Hematocrit 36.0 - 46.0 % 36.6 40.0 40.0  Platelets 150 - 400 K/uL 286 - -   Lipid Panel     Component Value Date/Time   CHOL 160 08/04/2014 1437   TRIG 107 08/04/2014 1437   HDL 49 08/04/2014 1437   CHOLHDL 3.3  08/04/2014 1437   VLDL 21 08/04/2014 1437   LDLCALC 90 08/04/2014 1437   HEMOGLOBIN A1C Lab Results  Component Value Date   HGBA1C 10.5 (H) 04/15/2016   MPG 255 04/15/2016   TSH No results for input(s): TSH in the last 8760 hours.  External labs: 05/29/2020: Sodium 137, potassium 4.0, BUN 15, glucose 131, creatinine 0.68, alk phos 285 (  high), AST 20, ALT 23, GFR >90 Total cholesterol 133, triglycerides 67, HDL 52, LDL 68 A1c 8.0% Hemoglobin 13.5, hematocrit 41.9, MCV 82.0, platelets 227  Medications and Allergies  No Known Allergies Current Outpatient Medications  Medication Instructions  . aspirin EC 81 mg, Oral, Daily  . atorvastatin (LIPITOR) 20 MG tablet TAKE 1 TABLET(20 MG) BY MOUTH DAILY  . Basaglar KwikPen 25 Units, Subcutaneous, Daily, Patient takes in am  . cholecalciferol (VITAMIN D3) 25 MCG (1000 UNIT) tablet Vitamin D  . cilostazol (PLETAL) 50 mg, Oral, 2 times daily  . dapagliflozin propanediol (FARXIGA) 10 mg, Oral, Daily  . ezetimibe (ZETIA) 10 mg, Oral, Daily  . metFORMIN (GLUCOPHAGE) 1,000 mg, Oral, 2 times daily with meals  . Multiple Vitamin (MULTIVITAMIN WITH MINERALS) TABS tablet 1 tablet, Oral, Daily, Hasn't had in a week  . TRULICITY 4.5 OJ/5.0KX SOPN Subcutaneous  . valsartan (DIOVAN) 40 mg, Oral, Daily    Radiology   No results found.  Cardiac Studies:   ABI 12/26/2018: This exam reveals mildly decreased perfusion of the right lower extremity, noted at the dorsalis pedis artery level (ABI 0.81). This exam reveals moderately decreased perfusion of the left lower extremity, noted at the dorsalis pedis artery level (ABI 0.69).  Compared to lower extremity arterial duplex/ABI done on 01/10/2018, no significant change.  Coronary angiogram 02/13/2012:  Mid LAD Overlapping 3.0 x 16 mm and2.75 x 38 mm Promus DES.  Diagonal vessels with ostial 95% stenosis, small calibered.  Mild disease in circumflex and right coronary artery.  EF 55%.  Lexiscan  sestamibi stress test 04/11/2016: 1. The resting electrocardiogram demonstrated normal sinus rhythm, normal resting conduction, no resting arrhythmias and normal rest repolarization.  Low voltage and poor R progression. Stress EKG is non-diagnostic for ischemia as it a pharmacologic stress using Lexiscan. Stress symptoms included dyspnea. 2. Myocardial perfusion imaging is normal. Overall left ventricular systolic function was normal without regional wall motion abnormalities. The left ventricular ejection fraction was 66%.  This is a low risk study.  Lower extremity arterial duplex 01/10/2018: Diffuse calcific plaque bilaterally. Biphasic waveform right lower extremity without high grade stenosis. Right PT appears occluded. Left proximal SFA >50% stenosis with calcific plaque.  This exam reveals mildly decreased perfusion of the right lower extremity, noted at the dorsalis pedis and post tibial artery level (ABI 0.86) and moderately decreased perfusion of the left lower extremity, noted at the dorsalis pedis and post tibial artery level (ABI 0.68).  Compared to the study done on 05/24/2016, left SFA stenosis is new.  Otherwise no significant change.  Carotid artery duplex 01/10/2018: Stenosis in the bilateral external carotid artery (<50%) with mild heterogeneous plaque. Source of bruit. Antegrade right vertebral artery flow. Antegrade left vertebral artery flow. Follow up studies when clinically indicated.   EKG   EKG 06/17/2020: Sinus rhythm at a rate of 79 bpm.  Left atrial enlargement.  Normal axis.  Poor progression, cannot exclude anteroseptal infarct old.  No evidence of ischemia or underlying injury pattern.  EKG 07/30/2018: Normal sinus rhythm at 81 bpm, left atrial enlargement, normal axis, anterior infarct old. No evidence of ischemia.   Assessment   Primary hypertension - Plan: EKG 38-HWEX, Basic metabolic panel  Hypercholesteremia  Coronary artery disease involving native  coronary artery of native heart without angina pectoris  PAD (peripheral artery disease) (H[facility])  Type 2 diabetes mellitus with diabetic neuropathy, with long-term current use of insulin ([place]) Meds ordered this encounter  Medications  . cilostazol (PLETAL) 50  MG tablet    Sig: Take 1 tablet (50 mg total) by mouth 2 (two) times daily.    Dispense:  180 tablet    Refill:  3  . valsartan (DIOVAN) 40 MG tablet    Sig: Take 1 tablet (40 mg total) by mouth daily.    Dispense:  30 tablet    Refill:  3   Medications Discontinued During This Encounter  Medication Reason  . amLODipine (NORVASC) 10 MG tablet Error  . cholecalciferol (VITAMIN D3) 25 MCG (9688 UT) tablet Duplicate  . empagliflozin (JARDIANCE) 25 MG TABS tablet Duplicate  . cilostazol (PLETAL) 50 MG tablet Reorder    Recommendations:   HARMONIE VERRASTRO  is a 55 y.o. female  with hypertension, former tobacco use, diabetes, hyperlipidemia, CAD with NSTEMI in 2013 and underwent PCI to LAD  at that time. She has severe peripheral artery disease and also claudication involving PAD and neurogenic lumbar claudication, symptoms of claudication had improved since being on Pletal.    Patient last seen in our office on 02/28/2019 by Binnie Kand, FNP.  At that visit increased amlodipine to 10 mg daily and recommended 8-week follow-up of hypertension.  Unfortunately patient was lost to follow-up until now.  Patient now presents for follow-up of hypertension, hyperlipidemia, CAD, and PAD.  Patient is doing well overall with stable physical exam in regard to PAD and no significant claudication symptoms.  We will refill Pletal.  In regard to CAD, patient is taking aspirin and atorvastatin.  I personally reviewed external labs, lipids are well controlled.  We will continue aspirin and atorvastatin.  However is not on beta-blocker therapy for ACE/ARB.  I thoroughly reviewed the record and seen contraindication to these medications, and patient does not  recall previous intolerances.  We will therefore start valsartan 80 mg daily and repeat BMP in 1 week.  Patient will monitor blood pressure on a daily basis at home and keep a written log.  Patient will call with blood pressure readings in 1-2 weeks.  If blood pressure allows will add metoprolol succinate.  Notably patient's blood pressure is elevated at today's visit, valsartan and eventually metoprolol will benefit patient in reducing cardiovascular risk as well as improving blood pressure control.  Follow-up in 6 weeks, sooner if needed, for hypertension and CAD.   Alethia Berthold, PA-C 06/17/2020, 3:15 PM Office: [phone]

## 2020-07-04 LAB — BASIC METABOLIC PANEL
BUN/Creatinine Ratio: 20 (ref 9–23)
BUN: 16 mg/dL (ref 6–24)
CO2: 23 mmol/L (ref 20–29)
Calcium: 10 mg/dL (ref 8.7–10.2)
Chloride: 101 mmol/L (ref 96–106)
Creatinine, Ser: 0.79 mg/dL (ref 0.57–1.00)
Glucose: 136 mg/dL — ABNORMAL HIGH (ref 65–99)
Potassium: 4.4 mmol/L (ref 3.5–5.2)
Sodium: 141 mmol/L (ref 134–144)
eGFR: 90 mL/min/{1.73_m2} (ref >59)

## 2020-07-06 NOTE — Progress Notes (Signed)
Please inform patient renal function electrolytes are stable.  Continue current medications

## 2020-07-07 NOTE — Progress Notes (Signed)
Called pt no answer, left a vm

## 2020-07-07 NOTE — Progress Notes (Signed)
Called patient, Vickie Little, LMAM.

## 2020-07-29 ENCOUNTER — Ambulatory Visit: Payer: BC Managed Care – PPO | Admitting: Student

## 2020-08-03 ENCOUNTER — Ambulatory Visit: Payer: BC Managed Care – PPO | Admitting: Student

## 2020-08-03 ENCOUNTER — Encounter: Payer: Self-pay | Admitting: Student

## 2020-08-03 ENCOUNTER — Other Ambulatory Visit: Payer: Self-pay

## 2020-08-03 VITALS — BP 139/81 | HR 87 | Temp 98.5°F | Resp 16 | Ht 67.0 in | Wt 186.8 lb

## 2020-08-03 DIAGNOSIS — I10 Essential (primary) hypertension: Secondary | ICD-10-CM

## 2020-08-03 DIAGNOSIS — I739 Peripheral vascular disease, unspecified: Secondary | ICD-10-CM

## 2020-08-03 DIAGNOSIS — Z794 Long term (current) use of insulin: Secondary | ICD-10-CM

## 2020-08-03 DIAGNOSIS — I2510 Atherosclerotic heart disease of native coronary artery without angina pectoris: Secondary | ICD-10-CM

## 2020-08-03 MED ORDER — METOPROLOL SUCCINATE ER 25 MG PO TB24: 25.0000 mg | ORAL_TABLET | Freq: Every day | ORAL | 3 refills | Status: DC

## 2020-08-03 NOTE — Patient Instructions (Signed)
Please notify the office if BP remains >130/80 mmHg.

## 2020-08-03 NOTE — Progress Notes (Signed)
Primary Physician/Referring:  Bartholome Bill, MD  Patient ID: Vickie Little, female    DOB: 11/09/1965, 55 y.o.   MRN: [id number]  Chief Complaint  Patient presents with   Hypertension   Coronary Artery Disease   Follow-up    6 weeks    HPI: Vickie Little  is a 55 y.o. female  with hypertension, former tobacco use, diabetes, hyperlipidemia, CAD with NSTEMI in 2013 and underwent PCI to LAD  at that time. She has severe peripheral artery disease and also claudication involving PAD and neurogenic lumbar claudication, symptoms of claudication had improved since being on Pletal.    Patient presents for 6-week follow-up of hypertension.  At last visit added valsartan 80 mg daily and repeat BMP showed stable renal function and electrolytes.  Also advised patient to continue to hold amlodipine at this time.  Patient has also restarted Pletal since last visit and symptoms of claudication remain well controlled. Denies chest pain, palpitations, syncope, near syncope, dyspnea or claudication.   Patient recently got back from a trip to [place] where she did lots of walking without issue.  She remains active walking in her neighborhood approximately 30 minutes 2-3 times per week without issue.  Notably patient has known history of CAD, however she is not on beta-blocker therapy.   Unfortunately patient does not have home blood pressure readings available as her home blood pressure cuff needs new batteries at this time.  Past Medical History:  Diagnosis Date   Arthritis    CAD (coronary artery disease), native coronary artery 07/30/2018   Diabetes mellitus without complication ([place])    type II    Heart attack ([place])    Hypertension    NSTEMI (non-ST elevated myocardial infarction) ([place]) 02/08/2012   Past Surgical History:  Procedure Laterality Date   CORONARY ANGIOPLASTY WITH STENT PLACEMENT     LEFT HEART CATHETERIZATION WITH CORONARY ANGIOGRAM N/A 02/13/2012   Procedure: LEFT  HEART CATHETERIZATION WITH CORONARY ANGIOGRAM;  Surgeon: Candee Furbish, MD;  Location: [facility] CATH LAB;  Service: Cardiovascular;  Laterality: N/A;   LUMBAR LAMINECTOMY/DECOMPRESSION MICRODISCECTOMY Left 04/20/2016   Procedure: 1. central decompression for spinal stenosis   1.central decompression for spinal stenosis L5-S1  2.foramionotomy L5 root and S1nerve root left       3. Microdisectomy L5-S1 left  ;  Surgeon: Latanya Maudlin, MD;  Location: [facility] ORS;  Service: Orthopedics;  Laterality: Left;   Family History  Adopted: Yes  Problem Relation Age of Onset   Diabetes Father    Social History   Tobacco Use   Smoking status: Former    Packs/day: 0.00    Years: 20.00    Pack years: 0.00    Types: Cigarettes    Quit date: 08/22/2015    Years since quitting: 4.9   Smokeless tobacco: Never  Substance Use Topics   Alcohol use: Yes    Comment: socially. 2-3 beers on weekend.  Marital Status: Married  ROS   Review of Systems  Cardiovascular:  Negative for chest pain, claudication, leg swelling, near-syncope, orthopnea, palpitations, paroxysmal nocturnal dyspnea and syncope.  Respiratory:  Negative for shortness of breath.   Neurological:  Negative for dizziness.     Objective  Blood pressure 139/81, pulse 87, temperature 98.5 F (36.9 C), temperature source Temporal, resp. rate 16, height _0  (1.702 m), weight 186 lb 12.8 oz (84.7 kg), SpO2 98 %. Body mass index is 29.26 kg/m.    Physical Exam Constitutional:  General: She is not in acute distress.    Appearance: She is well-developed.  Neck:     Thyroid: No thyromegaly.     Vascular: No JVD.  Cardiovascular:     Rate and Rhythm: Normal rate and regular rhythm.     Pulses: Intact distal pulses.          Carotid pulses are  on the right side with bruit and  on the left side with bruit.      Femoral pulses are 2+ on the right side and 2+ on the left side.      Popliteal pulses are 1+ on the right side and 1+ on the left side.        Dorsalis pedis pulses are 1+ on the right side and 0 on the left side.       Posterior tibial pulses are 0 on the right side and 1+ on the left side.     Heart sounds: Normal heart sounds. No murmur heard.   No gallop.  Pulmonary:     Effort: Pulmonary effort is normal.     Breath sounds: Normal breath sounds. No wheezing or rales.  Musculoskeletal:     Cervical back: Neck supple.     Right lower leg: No edema.     Left lower leg: No edema.  Neurological:     Mental Status: She is alert.   Laboratory examination:    CMP Latest Ref Rng & Units 07/03/2020 11/02/2018 11/02/2018  Glucose 65 - 99 mg/dL 136(H) - 187(H)  BUN 6 - 24 mg/dL 16 - 13  Creatinine 0.57 - 1.00 mg/dL 0.79 - 0.58  Sodium 134 - 144 mmol/L 141 - 134(L)  Potassium 3.5 - 5.2 mmol/L 4.4 - 4.0  Chloride 96 - 106 mmol/L 101 - 99  CO2 20 - 29 mmol/L 23 - 27  Calcium 8.7 - 10.2 mg/dL 10.0 9.6 9.5  Total Protein 6.0 - 8.3 g/dL - - 7.0  Total Bilirubin 0.2 - 1.2 mg/dL - - 0.7  Alkaline Phos 39 - 117 IU/L - 292(H) 279(H)  AST 0 - 37 U/L - - 14  ALT 0 - 35 U/L - - 18   CBC Latest Ref Rng & Units 04/15/2016 01/23/2016 12/14/2014  WBC 4.0 - 10.5 K/uL 5.9 - 6.1  Hemoglobin 12.0 - 15.0 g/dL 12.3 13.6 13.5  Hematocrit 36.0 - 46.0 % 36.6 40.0 40.0  Platelets 150 - 400 K/uL 286 - -   Lipid Panel     Component Value Date/Time   CHOL 160 08/04/2014 1437   TRIG 107 08/04/2014 1437   HDL 49 08/04/2014 1437   CHOLHDL 3.3 08/04/2014 1437   VLDL 21 08/04/2014 1437   LDLCALC 90 08/04/2014 1437   HEMOGLOBIN A1C Lab Results  Component Value Date   HGBA1C 10.5 (H) 04/15/2016   MPG 255 04/15/2016   TSH No results for input(s): TSH in the last 8760 hours.  External labs: 05/29/2020: Sodium 137, potassium 4.0, BUN 15, glucose 131, creatinine 0.68, alk phos 285 (high), AST 20, ALT 23, GFR >90 Total cholesterol 133, triglycerides 67, HDL 52, LDL 68 A1c 8.0% Hemoglobin 13.5, hematocrit 41.9, MCV 82.0, platelets 227 Allergies  No  Known Allergies    Medications Prior to Visit:   Outpatient Medications Prior to Visit  Medication Sig Dispense Refill   aspirin EC 81 MG tablet Take 81 mg by mouth daily.     atorvastatin (LIPITOR) 20 MG tablet TAKE 1 TABLET(20 MG) BY MOUTH DAILY  90 tablet 1   cholecalciferol (VITAMIN D3) 25 MCG (1000 UNIT) tablet Vitamin D     cilostazol (PLETAL) 50 MG tablet Take 1 tablet (50 mg total) by mouth 2 (two) times daily. 180 tablet 3   dapagliflozin propanediol (FARXIGA) 10 MG TABS tablet Take 10 mg by mouth daily.     ezetimibe (ZETIA) 10 MG tablet Take 1 tablet (10 mg total) by mouth daily. 30 tablet 2   Insulin Glargine (BASAGLAR KWIKPEN) 100 UNIT/ML SOPN Inject 25 Units into the skin daily. Patient takes in am     metFORMIN (GLUCOPHAGE) 500 MG tablet Take 1,000 mg by mouth 2 (two) times daily with a meal.      Multiple Vitamin (MULTIVITAMIN WITH MINERALS) TABS tablet Take 1 tablet by mouth daily. Hasn't had in a week     TRULICITY 4.5 FM/3.8GY SOPN Inject into the skin.     valsartan (DIOVAN) 40 MG tablet Take 1 tablet (40 mg total) by mouth daily. 30 tablet 3   gi cocktail (Maalox,Lidocaine,Donnatal)      No facility-administered medications prior to visit.   Final Medications at End of Visit    Current Meds  Medication Sig   metoprolol succinate (TOPROL XL) 25 MG 24 hr tablet Take 1 tablet (25 mg total) by mouth daily.   Radiology   No results found.  Cardiac Studies:   ABI 12/26/2018: This exam reveals mildly decreased perfusion of the right lower extremity, noted at the dorsalis pedis artery level (ABI 0.81). This exam reveals moderately decreased perfusion of the left lower extremity, noted at the dorsalis pedis artery level (ABI 0.69).  Compared to lower extremity arterial duplex/ABI done on 01/10/2018, no significant change.  Coronary angiogram 02/13/2012:  Mid LAD Overlapping 3.0 x 16 mm and2.75 x 38 mm Promus DES.  Diagonal vessels with ostial 95% stenosis, small  calibered.  Mild disease in circumflex and right coronary artery.  EF 55%.  Lexiscan sestamibi stress test 04/11/2016: 1. The resting electrocardiogram demonstrated normal sinus rhythm, normal resting conduction, no resting arrhythmias and normal rest repolarization.  Low voltage and poor R progression. Stress EKG is non-diagnostic for ischemia as it a pharmacologic stress using Lexiscan. Stress symptoms included dyspnea. 2. Myocardial perfusion imaging is normal. Overall left ventricular systolic function was normal without regional wall motion abnormalities. The left ventricular ejection fraction was 66%.  This is a low risk study.  Lower extremity arterial duplex 01/10/2018: Diffuse calcific plaque bilaterally. Biphasic waveform right lower extremity without high grade stenosis. Right PT appears occluded. Left proximal SFA >50% stenosis with calcific plaque.  This exam reveals mildly decreased perfusion of the right lower extremity, noted at the dorsalis pedis and post tibial artery level (ABI 0.86) and moderately decreased perfusion of the left lower extremity, noted at the dorsalis pedis and post tibial artery level (ABI 0.68).  Compared to the study done on 05/24/2016, left SFA stenosis is new.  Otherwise no significant change.  Carotid artery duplex 01/10/2018: Stenosis in the bilateral external carotid artery (<50%) with mild heterogeneous plaque. Source of bruit. Antegrade right vertebral artery flow. Antegrade left vertebral artery flow. Follow up studies when clinically indicated.   EKG   EKG 06/17/2020: Sinus rhythm at a rate of 79 bpm.  Left atrial enlargement.  Normal axis.  Poor progression, cannot exclude anteroseptal infarct old.  No evidence of ischemia or underlying injury pattern.  EKG 07/30/2018: Normal sinus rhythm at 81 bpm, left atrial enlargement, normal axis, anterior infarct old. No evidence  of ischemia.   Assessment   Primary hypertension  Coronary artery disease  involving native coronary artery of native heart without angina pectoris  PAD (peripheral artery disease) ([place])  Type 2 diabetes mellitus with diabetic neuropathy, with long-term current use of insulin ([place]) Meds ordered this encounter  Medications   metoprolol succinate (TOPROL XL) 25 MG 24 hr tablet    Sig: Take 1 tablet (25 mg total) by mouth daily.    Dispense:  30 tablet    Refill:  3   Medications Discontinued During This Encounter  Medication Reason   gi cocktail (Maalox,Lidocaine,Donnatal) Error    Recommendations:   Vickie Little  is a 55 y.o. female  with hypertension, former tobacco use, diabetes, hyperlipidemia, CAD with NSTEMI in 2013 and underwent PCI to LAD  at that time. She has severe peripheral artery disease and also claudication involving PAD and neurogenic lumbar claudication, symptoms of claudication had improved since being on Pletal.    Patient presents for 6-week follow-up of CAD and hypertension.  At last visit added valsartan 80 mg daily, patient's repeat BMP revealed stable renal function electrolytes.  Patient is feeling well overall without specific complaints today.  Fortunately blood pressure readings are not available, but patient's blood pressure remains elevated above goal of <130/80 mmHg in the office today.  We will therefore start patient on metoprolol succinate 25 mg daily.  Addition of beta-blocker therapy will also be beneficial for him CAD standpoint.  Patient now on guideline directed medical therapy including aspirin, atorvastatin, valsartan, and metoprolol.  Patient will continue to monitor blood pressure on a regular basis at home and notify our office if it remains >130/80 mmHg.  Patient denies symptoms of claudication at this time, will continue aspirin and Pletal. Patient is otherwise stable from cardiovascular standpoint.   Follow-up in 6 months, sooner if needed, for hypertension, PAD, and CAD.   Alethia Berthold, PA-C 08/03/2020,  1:34 PM Office: [phone]

## 2020-12-23 ENCOUNTER — Other Ambulatory Visit: Payer: Self-pay | Admitting: Student

## 2021-02-02 ENCOUNTER — Ambulatory Visit: Payer: BC Managed Care – PPO | Admitting: Student

## 2021-02-08 NOTE — Progress Notes (Signed)
Primary Physician/Referring:  Bartholome Bill, MD  Patient ID: Vickie Little, female    DOB: [birth year], 56 y.o.   MRN: [id number]  Chief Complaint  Patient presents with   Hypertension   Follow-up   Coronary Artery Disease    HPI: Vickie Little  is a 55 y.o. female  with hypertension, former tobacco use, diabetes, hyperlipidemia, CAD with NSTEMI in 2013 and underwent PCI to LAD  at that time. She has severe peripheral artery disease and also claudication involving PAD and neurogenic lumbar claudication, symptoms of claudication had improved since being on Pletal.    Patient presents for 47-monthfollow-up of hypertension, PAD, CAD.  Last office visit started patient on Toprol-XL 25 mg daily.  Patient is feeling well overall without specific complaints.  She is tolerating medications without issue. Denies chest pain, palpitations, syncope, near syncope, dyspnea or claudication.   Patient's blood pressure is elevated in the office today.  She notes that she has gained approximately 5 pounds over the last several weeks and admits to high sodium diet.  Past Medical History:  Diagnosis Date   Arthritis    CAD (coronary artery disease), native coronary artery 07/30/2018   Diabetes mellitus without complication ([place]    type II    Heart attack ([place]    Hypertension    NSTEMI (non-ST elevated myocardial infarction) ([facility] 02/08/2012   Past Surgical History:  Procedure Laterality Date   CORONARY ANGIOPLASTY WITH STENT PLACEMENT     LEFT HEART CATHETERIZATION WITH CORONARY ANGIOGRAM N/A 02/13/2012   Procedure: LEFT HEART CATHETERIZATION WITH CORONARY ANGIOGRAM;  Surgeon: MCandee Furbish MD;  [facility];  Service: Cardiovascular;  Laterality: N/A;   LUMBAR LAMINECTOMY/DECOMPRESSION MICRODISCECTOMY Left 04/20/2016   Procedure: 1. central decompression for spinal stenosis   1.central decompression for spinal stenosis L5-S1  2.foramionotomy L5 root and S1nerve root left        3. Microdisectomy L5-S1 left  ;  Surgeon: RLatanya Maudlin MD;  Location: [facility];  Service: Orthopedics;  Laterality: Left;   Family History  Adopted: Yes  Problem Relation Age of Onset   Diabetes Father    Social History   Tobacco Use   Smoking status: Former    Packs/day: 0.00    Years: 20.00    Pack years: 0.00    Types: Cigarettes    Quit date: 08/22/2015    Years since quitting: 5.4   Smokeless tobacco: Never  Substance Use Topics   Alcohol use: Yes    Comment: socially. 2-3 beers on weekend.  Marital Status: Married  ROS   Review of Systems  Constitutional: Negative for malaise/fatigue and weight gain.  Cardiovascular:  Negative for chest pain, claudication, leg swelling, near-syncope, orthopnea, palpitations, paroxysmal nocturnal dyspnea and syncope.  Respiratory:  Negative for shortness of breath.   Neurological:  Negative for dizziness.     Objective  Blood pressure (!) 138/93, pulse 82, temperature 97.8 F (36.6 C), temperature source Temporal, height 5' 7" (1.702 m), weight 186 lb (84.4 kg), SpO2 98 %. Body mass index is 29.13 kg/m.    Physical Exam Vitals reviewed.  Constitutional:      General: She is not in acute distress.    Appearance: She is well-developed.  Neck:     Vascular: No JVD.  Cardiovascular:     Rate and Rhythm: Normal rate and regular rhythm.     Pulses: Intact distal pulses.          Carotid pulses  are  on the right side with bruit and  on the left side with bruit.      Femoral pulses are 2+ on the right side and 2+ on the left side.      Popliteal pulses are 1+ on the right side and 1+ on the left side.       Dorsalis pedis pulses are 1+ on the right side and 0 on the left side.       Posterior tibial pulses are 0 on the right side and 1+ on the left side.     Heart sounds: Normal heart sounds. No murmur heard.   No gallop.  Pulmonary:     Effort: Pulmonary effort is normal.     Breath sounds: Normal breath sounds. No wheezing or  rales.  Musculoskeletal:     Right lower leg: No edema.     Left lower leg: No edema.  Neurological:     Mental Status: She is alert.   Laboratory examination:    CMP Latest Ref Rng & Units 07/03/2020 11/02/2018 11/02/2018  Glucose 65 - 99 mg/dL 136(H) - 187(H)  BUN 6 - 24 mg/dL 16 - 13  Creatinine 0.57 - 1.00 mg/dL 0.79 - 0.58  Sodium 134 - 144 mmol/L 141 - 134(L)  Potassium 3.5 - 5.2 mmol/L 4.4 - 4.0  Chloride 96 - 106 mmol/L 101 - 99  CO2 20 - 29 mmol/L 23 - 27  Calcium 8.7 - 10.2 mg/dL 10.0 9.6 9.5  Total Protein 6.0 - 8.3 g/dL - - 7.0  Total Bilirubin 0.2 - 1.2 mg/dL - - 0.7  Alkaline Phos 39 - 117 IU/L - 292(H) 279(H)  AST 0 - 37 U/L - - 14  ALT 0 - 35 U/L - - 18   CBC Latest Ref Rng & Units 04/15/2016 01/23/2016 12/14/2014  WBC 4.0 - 10.5 K/uL 5.9 - 6.1  Hemoglobin 12.0 - 15.0 g/dL 12.3 13.6 13.5  Hematocrit 36.0 - 46.0 % 36.6 40.0 40.0  Platelets 150 - 400 K/uL 286 - -   Lipid Panel     Component Value Date/Time   CHOL 160 08/04/2014 1437   TRIG 107 08/04/2014 1437   HDL 49 08/04/2014 1437   CHOLHDL 3.3 08/04/2014 1437   VLDL 21 08/04/2014 1437   LDLCALC 90 08/04/2014 1437   HEMOGLOBIN A1C Lab Results  Component Value Date   HGBA1C 10.5 (H) 04/15/2016   MPG 255 04/15/2016   TSH No results for input(s): TSH in the last 8760 hours.  External labs: 05/29/2020: Sodium 137, potassium 4.0, BUN 15, glucose 131, creatinine 0.68, alk phos 285 (high), AST 20, ALT 23, GFR >90 Total cholesterol 133, triglycerides 67, HDL 52, LDL 68 A1c 8.0% Hemoglobin 13.5, hematocrit 41.9, MCV 82.0, platelets 227  Allergies  No Known Allergies   Medications Prior to Visit:   Outpatient Medications Prior to Visit  Medication Sig Dispense Refill   aspirin EC 81 MG tablet Take 81 mg by mouth daily.     atorvastatin (LIPITOR) 20 MG tablet TAKE 1 TABLET(20 MG) BY MOUTH DAILY 90 tablet 1   cholecalciferol (VITAMIN D3) 25 MCG (1000 UNIT) tablet Vitamin D     cilostazol (PLETAL) 50 MG  tablet Take 1 tablet (50 mg total) by mouth 2 (two) times daily. 180 tablet 3   dapagliflozin propanediol (FARXIGA) 10 MG TABS tablet Take 10 mg by mouth daily.     ezetimibe (ZETIA) 10 MG tablet Take 1 tablet (10 mg total) by mouth  daily. 30 tablet 2   metFORMIN (GLUCOPHAGE) 500 MG tablet Take 1,000 mg by mouth 2 (two) times daily with a meal.      metoprolol succinate (TOPROL-XL) 25 MG 24 hr tablet TAKE 1 TABLET(25 MG) BY MOUTH DAILY 30 tablet 3   Multiple Vitamin (MULTIVITAMIN WITH MINERALS) TABS tablet Take 1 tablet by mouth daily. Hasn't had in a week     Phenylephrine-DM-GG (TUSSIN CF PO) Take by mouth.     TRULICITY 4.5 CX/5.0HK SOPN Inject into the skin.     valsartan (DIOVAN) 40 MG tablet Take 1 tablet (40 mg total) by mouth daily. 30 tablet 3   Insulin Glargine (BASAGLAR KWIKPEN) 100 UNIT/ML SOPN Inject 25 Units into the skin daily. Patient takes in am     No facility-administered medications prior to visit.   Final Medications at End of Visit    Current Meds  Medication Sig   aspirin EC 81 MG tablet Take 81 mg by mouth daily.   atorvastatin (LIPITOR) 20 MG tablet TAKE 1 TABLET(20 MG) BY MOUTH DAILY   cholecalciferol (VITAMIN D3) 25 MCG (1000 UNIT) tablet Vitamin D   cilostazol (PLETAL) 50 MG tablet Take 1 tablet (50 mg total) by mouth 2 (two) times daily.   dapagliflozin propanediol (FARXIGA) 10 MG TABS tablet Take 10 mg by mouth daily.   ezetimibe (ZETIA) 10 MG tablet Take 1 tablet (10 mg total) by mouth daily.   metFORMIN (GLUCOPHAGE) 500 MG tablet Take 1,000 mg by mouth 2 (two) times daily with a meal.    metoprolol succinate (TOPROL-XL) 25 MG 24 hr tablet TAKE 1 TABLET(25 MG) BY MOUTH DAILY   Multiple Vitamin (MULTIVITAMIN WITH MINERALS) TABS tablet Take 1 tablet by mouth daily. Hasn't had in a week   Phenylephrine-DM-GG (TUSSIN CF PO) Take by mouth.   TRULICITY 4.5 UV/7.5YN SOPN Inject into the skin.   valsartan (DIOVAN) 40 MG tablet Take 1 tablet (40 mg total) by mouth  daily.   [DISCONTINUED] Insulin Glargine (BASAGLAR KWIKPEN) 100 UNIT/ML SOPN Inject 25 Units into the skin daily. Patient takes in am   Radiology   No results found.  Cardiac Studies:   ABI 12/26/2018: This exam reveals mildly decreased perfusion of the right lower extremity, noted at the dorsalis pedis artery level (ABI 0.81). This exam reveals moderately decreased perfusion of the left lower extremity, noted at the dorsalis pedis artery level (ABI 0.69).  Compared to lower extremity arterial duplex/ABI done on 01/10/2018, no significant change.  Coronary angiogram 02/13/2012:  Mid LAD Overlapping 3.0 x 16 mm and2.75 x 38 mm Promus DES.  Diagonal vessels with ostial 95% stenosis, small calibered.  Mild disease in circumflex and right coronary artery.  EF 55%.  Lexiscan sestamibi stress test 04/11/2016: 1. The resting electrocardiogram demonstrated normal sinus rhythm, normal resting conduction, no resting arrhythmias and normal rest repolarization.  Low voltage and poor R progression. Stress EKG is non-diagnostic for ischemia as it a pharmacologic stress using Lexiscan. Stress symptoms included dyspnea. 2. Myocardial perfusion imaging is normal. Overall left ventricular systolic function was normal without regional wall motion abnormalities. The left ventricular ejection fraction was 66%.  This is a low risk study.  Lower extremity arterial duplex 01/10/2018: Diffuse calcific plaque bilaterally. Biphasic waveform right lower extremity without high grade stenosis. Right PT appears occluded. Left proximal SFA >50% stenosis with calcific plaque.  This exam reveals mildly decreased perfusion of the right lower extremity, noted at the dorsalis pedis and post tibial artery level (ABI  0.86) and moderately decreased perfusion of the left lower extremity, noted at the dorsalis pedis and post tibial artery level (ABI 0.68).  Compared to the study done on 05/24/2016, left SFA stenosis is new.  Otherwise  no significant change.  Carotid artery duplex 01/10/2018: Stenosis in the bilateral external carotid artery (<50%) with mild heterogeneous plaque. Source of bruit. Antegrade right vertebral artery flow. Antegrade left vertebral artery flow. Follow up studies when clinically indicated.   EKG  02/09/2021: Sinus rhythm rate of 83 bpm.  Normal axis.  Left atrial enlargement.  Poor refreshing, cannot exclude anteroseptal infarct old.  Compared EKG 06/17/2020, no significant change.  07/30/2018: Normal sinus rhythm at 81 bpm, left atrial enlargement, normal axis, anterior infarct old. No evidence of ischemia.   Assessment   Primary hypertension - Plan: EKG 12-Lead  Coronary artery disease involving native coronary artery of native heart without angina pectoris - Plan: EKG 12-Lead  PAD (peripheral artery disease) ([facility]) No orders of the defined types were placed in this encounter.  Medications Discontinued During This Encounter  Medication Reason   Insulin Glargine (BASAGLAR KWIKPEN) 100 UNIT/ML SOPN Change in therapy    Recommendations:   Vickie Little  is a 55 y.o. female  with hypertension, former tobacco use, diabetes, hyperlipidemia, CAD with NSTEMI in 2013 and underwent PCI to LAD  at that time. She has severe peripheral artery disease and also claudication involving PAD and neurogenic lumbar claudication, symptoms of claudication had improved since being on Pletal.    Patient presents for 23-monthfollow-up of hypertension, PAD, CAD.  Last office visit started patient on Toprol-XL 25 mg daily.  Patient is feeling well overall without specific complaints.  She is tolerating medications without issue.  Notably her blood pressure is mildly elevated in the office today.  She admits to high sodium intake and recent weight gain.  Discussed at length with patient regarding diet and lifestyle modifications including weight loss, patient appears motivated to make changes.  Will not make  medication changes at this time, rather Will first attempt diet changes and weight loss.  Patient will monitor her blood pressure regularly at home and notify our office if it remains >130/80 mmHg.  Patient denies symptoms of claudication at this time, will continue aspirin and Pletal. Patient is otherwise stable from cardiovascular standpoint.   Follow-up in 1 year, sooner if needed, for hypertension, PAD, and CAD.   CAlethia Berthold PA-C 02/09/2021, 10:37 AM Office: [phone]

## 2021-02-09 ENCOUNTER — Encounter: Payer: Self-pay | Admitting: Student

## 2021-02-09 ENCOUNTER — Ambulatory Visit: Payer: BC Managed Care – PPO | Admitting: Student

## 2021-02-09 ENCOUNTER — Other Ambulatory Visit: Payer: Self-pay

## 2021-02-09 VITALS — BP 138/93 | HR 82 | Temp 97.8°F | Ht 67.0 in | Wt 186.0 lb

## 2021-02-09 DIAGNOSIS — I10 Essential (primary) hypertension: Secondary | ICD-10-CM

## 2021-02-09 DIAGNOSIS — I2510 Atherosclerotic heart disease of native coronary artery without angina pectoris: Secondary | ICD-10-CM

## 2021-02-09 DIAGNOSIS — I739 Peripheral vascular disease, unspecified: Secondary | ICD-10-CM

## 2021-03-28 ENCOUNTER — Other Ambulatory Visit: Payer: Self-pay | Admitting: Student

## 2021-08-20 ENCOUNTER — Other Ambulatory Visit: Payer: Self-pay | Admitting: Student

## 2021-08-30 ENCOUNTER — Ambulatory Visit: Payer: BC Managed Care – PPO | Admitting: Internal Medicine

## 2021-08-30 ENCOUNTER — Encounter: Payer: Self-pay | Admitting: Internal Medicine

## 2021-08-30 VITALS — BP 136/80 | HR 60 | Ht 67.0 in | Wt 186.0 lb

## 2021-08-30 DIAGNOSIS — R748 Abnormal levels of other serum enzymes: Secondary | ICD-10-CM

## 2021-08-30 DIAGNOSIS — M859 Disorder of bone density and structure, unspecified: Secondary | ICD-10-CM

## 2021-08-30 LAB — PHOSPHORUS: Phosphorus: 3.7 mg/dL (ref 2.3–4.6)

## 2021-08-30 LAB — T4, FREE: Free T4: 0.96 ng/dL (ref 0.60–1.60)

## 2021-08-30 LAB — TSH: TSH: 0.58 u[IU]/mL (ref 0.35–5.50)

## 2021-08-30 LAB — VITAMIN D 25 HYDROXY (VIT D DEFICIENCY, FRACTURES): VITD: 32.62 ng/mL (ref 30.00–100.00)

## 2021-08-30 LAB — MAGNESIUM: Magnesium: 1.6 mg/dL (ref 1.5–2.5)

## 2021-08-30 NOTE — Progress Notes (Unsigned)
Name: Vickie Little  MRN/ DOB: [id number], 09/06/1965    Age/ Sex: 56 y.o., female    PCP: Verlon Au, MD   Reason for Endocrinology Evaluation: Elevated Alkaline Phophatase     Date of Initial Endocrinology Evaluation: 11/02/2018    HPI: [name] is a 56 y.o. female with a past medical history of HTN, Dyslipidemia, Hx of pancreatitis, Diabetes and CAD. The patient presented for initial endocrinology clinic visit on 11/02/2018 for consultative assistance with her elevated alkaline phosphatase.   Pt was referred here for evaluation of elevated alkaline phosphatase  On her initial visit to our clinic vitamin D deficiency has resolved  Alkaline phosphatase assess times showed elevated bone fracture at 75% with normal intestinal fraction of 4% and liver fraction 21% DXA scan showed low bone density  TFTs, calcium, and PTH have all come back normal   Bone scan in October 2020 showed degenerative joint disease, otherwise exam was normal  She was noted to have a low vitamin D in 08/2018   She is adopted   Sister with elevated ? LFT's , maternal uncle as well    She was lost to follow up until her return in 08/2021  Most recent Alk. Phos was 308 on 07/06/2021     SUBJECTIVE:    Today (08/30/21):  [name]   She has hand pain, and knee pains , aching in nature  No recent bone surgery  Denies bone fractures  Denies nausea but has occasional constipation with diarrhea   She continues on Vitamin D 2000 iu daily  She is not on calcium except through MVI    BG's have been in the low 100's     HISTORY:  Past Medical History:  Past Medical History:  Diagnosis Date   Arthritis    CAD (coronary artery disease), native coronary artery 07/30/2018   Diabetes mellitus without complication (HCC)    type II    Heart attack (HCC)    Hypertension    NSTEMI (non-ST elevated myocardial infarction) (HCC) 02/08/2012   Past Surgical History:  Past  Surgical History:  Procedure Laterality Date   CORONARY ANGIOPLASTY WITH STENT PLACEMENT     LEFT HEART CATHETERIZATION WITH CORONARY ANGIOGRAM N/A 02/13/2012   Procedure: LEFT HEART CATHETERIZATION WITH CORONARY ANGIOGRAM;  Surgeon: Donato Schultz, MD;  Location: [facility] CATH LAB;  Service: Cardiovascular;  Laterality: N/A;   LUMBAR LAMINECTOMY/DECOMPRESSION MICRODISCECTOMY Left 04/20/2016   Procedure: 1. central decompression for spinal stenosis   1.central decompression for spinal stenosis L5-S1  2.foramionotomy L5 root and S1nerve root left       3. Microdisectomy L5-S1 left  ;  Surgeon: Ranee Gosselin, MD;  Location: [facility];  Service: Orthopedics;  Laterality: Left;    Social History:  reports that she quit smoking about 6 years ago. Her smoking use included cigarettes. She has never used smokeless tobacco. She reports current alcohol use. She reports that she does not use drugs. Family History: family history includes Diabetes in her father. She was adopted.   HOME MEDICATIONS: Allergies as of 08/30/2021   No Known Allergies      Medication List        Accurate as of August 30, 2021  9:25 AM. If you have any questions, ask your nurse or doctor.          aspirin EC 81 MG tablet Take 81 mg by mouth daily.   atorvastatin 20 MG tablet Commonly known as: LIPITOR TAKE  1 TABLET(20 MG) BY MOUTH DAILY   cholecalciferol 25 MCG (1000 UNIT) tablet Commonly known as: VITAMIN D3 Vitamin D   cilostazol 50 MG tablet Commonly known as: PLETAL TAKE 1 TABLET(50 MG) BY MOUTH TWICE DAILY   dapagliflozin propanediol 10 MG Tabs tablet Commonly known as: FARXIGA Take 10 mg by mouth daily.   ezetimibe 10 MG tablet Commonly known as: ZETIA Take 1 tablet (10 mg total) by mouth daily.   metFORMIN 500 MG tablet Commonly known as: GLUCOPHAGE Take 1,000 mg by mouth 2 (two) times daily with a meal.   metoprolol succinate 25 MG 24 hr tablet Commonly known as: TOPROL-XL TAKE 1 TABLET(25 MG) BY MOUTH  DAILY   multivitamin with minerals Tabs tablet Take 1 tablet by mouth daily. Hasn't had in a week   Trulicity 4.5 YJ/8.5UD Sopn Generic drug: Dulaglutide Inject into the skin.   TUSSIN CF PO Take by mouth.   valsartan 40 MG tablet Commonly known as: DIOVAN Take 1 tablet (40 mg total) by mouth daily.             OBJECTIVE:  VS: BP 136/80 (BP Location: Left Arm, Patient Position: Sitting, Cuff Size: Small)   Pulse 60   Ht $R'5\' 7"'hL$  (1.702 m)   Wt 186 lb (84.4 kg)   SpO2 97%   BMI 29.13 kg/m    Wt Readings from Last 3 Encounters:  08/30/21 186 lb (84.4 kg)  02/09/21 186 lb (84.4 kg)  08/03/20 186 lb 12.8 oz (84.7 kg)     EXAM: General: Pt appears well and is in NAD  Neck: General: Supple without adenopathy. Thyroid: Thyroid size normal.  No goiter or nodules appreciated.  Lungs: Clear with good BS bilat with no rales, rhonchi, or wheezes  Heart: Auscultation: RRR.  Abdomen: Normoactive bowel sounds, soft, nontender, without masses or organomegaly palpable  Extremities:  BL LE: No pretibial edema normal ROM and strength.  Mental Status: Judgment, insight: Intact Orientation: Oriented to time, place, and person Mood and affect: No depression, anxiety, or agitation     DATA REVIEWED: 08/30/18  Alk Phos 314 (35-104) Liver 1% 17.2 (27.8-76.3%)                 58.1 (16.2-70.2) Liver2 %   0.0 Bone % 82.8 (19.1-67.7)  Bone 279.9     (12.1-42.7) IU/L  Intestinal %   0     DXA 10/01/2018 AP LUMBAR SPINE L1-L3  Bone Mineral Density (BMD):  0.988 g/cm2  Young Adult T-Score:  -0.3  Z-Score:  0.5  LEFT FEMUR TOTAL  Bone Mineral Density (BMD):  0.791 g/cm2  Young Adult T-Score: -1.2  Z-Score:  -0.7   ASSESSMENT/PLAN/RECOMMENDATIONS:   Elevated Alkaline Phosphatase:   - In  this patient it is of bone origin based on labs results from PCP's office.  - Elevated Alkaline phosphatase of bone origin is due to increase osteoblastic activity.  - Causes  include : hyperthyroidism, hyperparathyroidism, osteomalacia , recent fractures, paget's disease or familial causes. Which the patient had none in the past except for a maternal FH of elevated LFTs  - Based on her recent labs that exclude hyperparathyroidism and hyperthyroidism - DXA scan shows osteopenia, she is due for a repeat , will order to include distal radius  - Bone scan in 2020 showed DJD  otherwise normal      2. Hx Vitamin D Deficiency :   - Resolved  - Will reduce Vitamin D as below , she is currently  on > 3000 iu daily of vitamin D in addition to spending 20-30 minutes in direct sun - Pt advised to avoid direct sun exposure due to increased risk of skin cancer.   Medications: Vitamin D 2000 iu daily   3. Low Bone density :  - Encouraged optimization of calcium and vitamin D - Will place order for Premier    F/u in 4 months       Signed electronically by: Mack Guise, MD  [facility] Endocrinology  [facility] [address] [place], [place] [address] Phone: [phone] FAX: [phone]   CC: [address], [place] [address] Phone: [phone] Fax: [phone]   Return to Endocrinology clinic as below: Future Appointments  Date Time Provider [place]  08/30/2021  9:30 AM Margean Korell, Melanie Crazier, MD [facility] None  02/09/2022 10:00 AM [place], Gerline Legacy, PA-C [facility] None

## 2021-09-02 ENCOUNTER — Telehealth: Payer: Self-pay | Admitting: Internal Medicine

## 2021-09-02 LAB — ALKALINE PHOSPHATASE, ISOENZYMES
Alkaline Phosphatase: 368 IU/L — ABNORMAL HIGH (ref 44–121)
BONE FRACTION: 69 % — ABNORMAL HIGH (ref 14–68)
INTESTINAL FRAC.: 5 % (ref 0–18)
LIVER FRACTION: 26 % (ref 18–85)

## 2021-09-02 NOTE — Telephone Encounter (Signed)
Attempted to call the patient on 09/02/2021 at 1550    Left a message for call back   Abby Raelyn Mora, MD  [facility] Endocrinology  [facility] [address] [place], [place] [address] Phone: [phone] FAX: [phone]

## 2021-09-03 ENCOUNTER — Telehealth: Payer: Self-pay

## 2021-09-03 NOTE — Telephone Encounter (Signed)
Patient called back for lab results and will for a return call.

## 2021-09-03 NOTE — Telephone Encounter (Signed)
Tried to contact patient no answer 

## 2021-09-04 LAB — C-TERMINAL TELOPEPTIDE: C-Telopeptide (CTx): 304 pg/mL

## 2021-09-04 LAB — PARATHYROID HORMONE, INTACT (NO CA): PTH: 20 pg/mL (ref 16–77)

## 2021-09-06 NOTE — Telephone Encounter (Signed)
Patient advised and order was Premier for scheduling

## 2022-01-10 NOTE — Progress Notes (Deleted)
Name: Vickie Little  MRN/ DOB: [id number], [birth year]    Age/ Sex: 56 y.o., female    PCP: Bartholome Bill, MD   Reason for Endocrinology Evaluation: Elevated Alkaline Phophatase     Date of Initial Endocrinology Evaluation: 11/02/2018    HPI: [name] is a 56 y.o. female with a past medical history of HTN, Dyslipidemia, Hx of pancreatitis, Diabetes and CAD. The patient presented for initial endocrinology clinic visit on 11/02/2018 for consultative assistance with her elevated alkaline phosphatase.   Pt was referred here for evaluation of elevated alkaline phosphatase  On her initial visit to our clinic vitamin D deficiency has resolved  Alkaline phosphatase assess times showed elevated bone fracture at 75% with normal intestinal fraction of 4% and liver fraction 21% DXA scan showed low bone density  TFTs, calcium, and PTH have all come back normal   Bone scan in October 2020 showed degenerative joint disease, otherwise exam was normal  She was noted to have a low vitamin D in 08/2018   She is adopted   Sister with elevated ? LFT's , maternal uncle as well    She was lost to follow up until her return in 08/2021  Most recent Alk. Phos was 308 on 07/06/2021     SUBJECTIVE:    Today (01/10/22):  Vickie Little   She has hand pain, and knee pains , aching in nature  No recent bone surgery  Denies bone fractures  Denies nausea but has occasional constipation with diarrhea   She continues on Vitamin D 2000 iu daily  She is not on calcium except through MVI    BG's have been in the low 100's     HISTORY:  Past Medical History:  Past Medical History:  Diagnosis Date   Arthritis    CAD (coronary artery disease), native coronary artery 07/30/2018   Diabetes mellitus without complication ([place])    type II    Heart attack ([place])    Hypertension    NSTEMI (non-ST elevated myocardial infarction) ([place]) 02/08/2012   Past Surgical History:  Past  Surgical History:  Procedure Laterality Date   CORONARY ANGIOPLASTY WITH STENT PLACEMENT     LEFT HEART CATHETERIZATION WITH CORONARY ANGIOGRAM N/A 02/13/2012   Procedure: LEFT HEART CATHETERIZATION WITH CORONARY ANGIOGRAM;  Surgeon: Candee Furbish, MD;  Location[facility] CATH LAB;  Service: Cardiovascular;  Laterality: N/A;   LUMBAR LAMINECTOMY/DECOMPRESSION MICRODISCECTOMY Left 04/20/2016   Procedure: 1. central decompression for spinal stenosis   1.central decompression for spinal stenosis L5-S1  2.foramionotomy L5 root and S1nerve root left       3. Microdisectomy L5-S1 left  ;  Surgeon: Latanya Maudlin, MD;  Location: [facility];  Service: Orthopedics;  Laterality: Left;    Social History:  reports that she quit smoking about 6 years ago. Her smoking use included cigarettes. She has never used smokeless tobacco. She reports current alcohol use. She reports that she does not use drugs. Family History: family history includes Diabetes in her father. She was adopted.   HOME MEDICATIONS: Allergies as of 01/11/2022   No Known Allergies      Medication List        Accurate as of January 10, 2022  3:28 PM. If you have any questions, ask your nurse or doctor.          aspirin EC 81 MG tablet Take 81 mg by mouth daily.   atorvastatin 20 MG tablet Commonly known as: LIPITOR TAKE  1 TABLET(20 MG) BY MOUTH DAILY   cholecalciferol 25 MCG (1000 UNIT) tablet Commonly known as: VITAMIN D3 Vitamin D   cilostazol 50 MG tablet Commonly known as: PLETAL TAKE 1 TABLET(50 MG) BY MOUTH TWICE DAILY   dapagliflozin propanediol 10 MG Tabs tablet Commonly known as: FARXIGA Take 10 mg by mouth daily.   ezetimibe 10 MG tablet Commonly known as: ZETIA Take 1 tablet (10 mg total) by mouth daily.   metFORMIN 500 MG tablet Commonly known as: GLUCOPHAGE Take 1,000 mg by mouth 2 (two) times daily with a meal.   metoprolol succinate 25 MG 24 hr tablet Commonly known as: TOPROL-XL TAKE 1 TABLET(25 MG) BY  MOUTH DAILY   multivitamin with minerals Tabs tablet Take 1 tablet by mouth daily. Hasn't had in a week   Trulicity 4.5 NG/2.9BM Sopn Generic drug: Dulaglutide Inject into the skin.   TUSSIN CF PO Take by mouth.   valsartan 40 MG tablet Commonly known as: DIOVAN Take 1 tablet (40 mg total) by mouth daily.             OBJECTIVE:  VS: There were no vitals taken for this visit.   Wt Readings from Last 3 Encounters:  08/30/21 186 lb (84.4 kg)  02/09/21 186 lb (84.4 kg)  08/03/20 186 lb 12.8 oz (84.7 kg)     EXAM: General: Pt appears well and is in NAD  Neck: General: Supple without adenopathy. Thyroid: Thyroid size normal.  No goiter or nodules appreciated.  Lungs: Clear with good BS bilat with no rales, rhonchi, or wheezes  Heart: Auscultation: RRR.  Abdomen: Normoactive bowel sounds, soft, nontender, without masses or organomegaly palpable  Extremities:  BL LE: No pretibial edema normal ROM and strength.  Mental Status: Judgment, insight: Intact Orientation: Oriented to time, place, and person Mood and affect: No depression, anxiety, or agitation     DATA REVIEWED:   Latest Reference Range & Units 08/30/21 09:48  Phosphorus 2.3 - 4.6 mg/dL 3.7  Magnesium 1.5 - 2.5 mg/dL 1.6  Alkaline Phosphatase 44 - 121 IU/L 368 (H)  BONE FRACTION  WILL FOLLOW (P)  INTESTINAL FRAC.  WILL FOLLOW (P)  LIVER FRACTION  WILL FOLLOW (P)  VITD 30.00 - 100.00 ng/mL 32.62  PTH, Intact 16 - 77 pg/mL 20  TSH 0.35 - 5.50 uIU/mL 0.58  T4,Free(Direct) 0.60 - 1.60 ng/dL 0.96        08/30/2018  Alk Phos 314 (35-104) Liver 1% 17.2 (27.8-76.3%)                 58.1 (16.2-70.2) Liver2 %   0.0 Bone % 82.8 (19.1-67.7)  Bone 279.9     (12.1-42.7) IU/L  Intestinal %   0     DXA 10/01/2018 AP LUMBAR SPINE L1-L3  Bone Mineral Density (BMD):  0.988 g/cm2  Young Adult T-Score:  -0.3  Z-Score:  0.5  LEFT FEMUR TOTAL  Bone Mineral Density (BMD):  0.791 g/cm2  Young Adult  T-Score: -1.2  Z-Score:  -0.7   ASSESSMENT/PLAN/RECOMMENDATIONS:   Elevated Alkaline Phosphatase:   - In  this patient it is of bone origin based on labs results from PCP's office.  - Elevated Alkaline phosphatase of bone origin is due to increase osteoblastic activity.  - Causes include : hyperthyroidism, hyperparathyroidism, osteomalacia , recent fractures, paget's disease or familial causes. Which the patient had none in the past except for a maternal FH of elevated LFTs  - Based on her recent labs that exclude hyperparathyroidism and  hyperthyroidism - DXA scan shows osteopenia, she is due for a repeat , will order to include distal radius  - Bone scan in 2020 showed DJD  otherwise normal      2. Hx Vitamin D Deficiency :   - Resolved   Medications: Vitamin D 2000 iu daily   3. Low Bone density :  - Encouraged optimization of calcium and vitamin D - Will place order for Premier    F/u in 4 months       Signed electronically by: Mack Guise, MD  [facility] Endocrinology  [facility] [address] [place], [place] [address] Phone: [phone] FAX: [phone]   CC: [address], [place] [address] Phone: [phone] Fax: [phone]   Return to Endocrinology clinic as below: Future Appointments  Date Time Provider [place]  01/11/2022  8:50 AM Jaidence Geisler, Melanie Crazier, MD [facility] None

## 2022-01-11 ENCOUNTER — Ambulatory Visit: Payer: BC Managed Care – PPO | Admitting: Internal Medicine

## 2022-02-09 ENCOUNTER — Encounter: Payer: Self-pay | Admitting: Internal Medicine

## 2022-02-09 ENCOUNTER — Ambulatory Visit: Payer: BC Managed Care – PPO | Admitting: Internal Medicine

## 2022-02-09 ENCOUNTER — Ambulatory Visit: Payer: BC Managed Care – PPO | Admitting: Student

## 2022-02-09 VITALS — BP 145/82 | HR 81 | Ht 67.0 in | Wt 191.0 lb

## 2022-02-09 DIAGNOSIS — E1140 Type 2 diabetes mellitus with diabetic neuropathy, unspecified: Secondary | ICD-10-CM

## 2022-02-09 DIAGNOSIS — I10 Essential (primary) hypertension: Secondary | ICD-10-CM

## 2022-02-09 DIAGNOSIS — I2510 Atherosclerotic heart disease of native coronary artery without angina pectoris: Secondary | ICD-10-CM

## 2022-02-09 NOTE — Progress Notes (Signed)
Primary Physician/Referring:  Bartholome Bill, MD  Patient ID: Vickie Little, female    DOB: [birth year], 56 y.o.   MRN: [id number]  Chief Complaint  Patient presents with   PAD   Coronary Artery Disease   Hypertension    HPI: Vickie Little  is a 56 y.o. female  with hypertension, former tobacco use, diabetes, hyperlipidemia, CAD with NSTEMI in 2013 and underwent PCI to LAD  at that time. She has severe peripheral artery disease and also claudication involving PAD and neurogenic lumbar claudication, symptoms of claudication had improved since being on Pletal.    Patient presents for follow-up visit. She has been doing well since her last visit. She has been watching her diet and trying to lose weight. Her pressure is slightly elevated here today but at home she always runs in the 120s/70s. When she saw her PCP recently, her blood pressure was very well controlled. She just came from work and had a very stressful day so that is likely why her pressure is slightly high right now. Patient denies chest pain, shortness of breath, palpitations, diaphoresis, syncope, edema, PND, orthopnea.   Past Medical History:  Diagnosis Date   Arthritis    CAD (coronary artery disease), native coronary artery 07/30/2018   Diabetes mellitus without complication ([place])    type II    Heart attack ([place])    Hypertension    NSTEMI (non-ST elevated myocardial infarction) ([place]) 02/08/2012   Past Surgical History:  Procedure Laterality Date   CORONARY ANGIOPLASTY WITH STENT PLACEMENT     LEFT HEART CATHETERIZATION WITH CORONARY ANGIOGRAM N/A 02/13/2012   Procedure: LEFT HEART CATHETERIZATION WITH CORONARY ANGIOGRAM;  Surgeon: Candee Furbish, MD;  Location: [facility] CATH LAB;  Service: Cardiovascular;  Laterality: N/A;   LUMBAR LAMINECTOMY/DECOMPRESSION MICRODISCECTOMY Left 04/20/2016   Procedure: 1. central decompression for spinal stenosis   1.central decompression for spinal stenosis L5-S1  2.foramionotomy L5  root and S1nerve root left       3. Microdisectomy L5-S1 left  ;  Surgeon: Latanya Maudlin, MD;  Location: [facility] ORS;  Service: Orthopedics;  Laterality: Left;   Family History  Adopted: Yes  Problem Relation Age of Onset   Diabetes Father    Social History   Tobacco Use   Smoking status: Former    Packs/day: 0.00    Years: 20.00    Total pack years: 0.00    Types: Cigarettes    Quit date: 08/22/2015    Years since quitting: 6.4   Smokeless tobacco: Never  Substance Use Topics   Alcohol use: Yes    Comment: socially. 2-3 beers on weekend.  Marital Status: Married  ROS   Review of Systems  Constitutional: Negative for malaise/fatigue and weight gain.  Cardiovascular:  Negative for chest pain, claudication, leg swelling, near-syncope, orthopnea, palpitations, paroxysmal nocturnal dyspnea and syncope.  Respiratory:  Negative for shortness of breath.   Neurological:  Negative for dizziness.      Objective  Blood pressure (!) 145/82, pulse 81, height 5' 7" (1.702 m), weight 191 lb (86.6 kg), SpO2 98 %. Body mass index is 29.91 kg/m.    Physical Exam Vitals reviewed.  Constitutional:      General: She is not in acute distress.    Appearance: She is well-developed.  Neck:     Vascular: No JVD.  Cardiovascular:     Rate and Rhythm: Normal rate and regular rhythm.     Pulses: Intact distal pulses.  Carotid pulses are  on the right side with bruit and  on the left side with bruit.      Femoral pulses are 2+ on the right side and 2+ on the left side.      Popliteal pulses are 1+ on the right side and 1+ on the left side.       Dorsalis pedis pulses are 1+ on the right side and 0 on the left side.       Posterior tibial pulses are 0 on the right side and 1+ on the left side.     Heart sounds: Normal heart sounds. No murmur heard.    No gallop.  Pulmonary:     Effort: Pulmonary effort is normal.     Breath sounds: Normal breath sounds. No wheezing or rales.   Musculoskeletal:     Right lower leg: No edema.     Left lower leg: No edema.  Neurological:     Mental Status: She is alert.    Laboratory examination:       Latest Ref Rng & Units 08/30/2021    9:48 AM 07/03/2020    8:58 AM 11/02/2018    2:49 PM  CMP  Glucose 65 - 99 mg/dL  136  187   BUN 6 - 24 mg/dL  16  13   Creatinine 0.57 - 1.00 mg/dL  0.79  0.58   Sodium 134 - 144 mmol/L  141  134   Potassium 3.5 - 5.2 mmol/L  4.4  4.0   Chloride 96 - 106 mmol/L  101  99   CO2 20 - 29 mmol/L  23  27   Calcium 8.7 - 10.2 mg/dL  10.0  9.6    9.5   Total Protein 6.0 - 8.3 g/dL   7.0   Total Bilirubin 0.2 - 1.2 mg/dL   0.7   Alkaline Phos 44 - 121 IU/L 368   279    292   AST 0 - 37 U/L   14   ALT 0 - 35 U/L   18       Latest Ref Rng & Units 04/15/2016    9:57 AM 01/23/2016   12:10 PM 12/14/2014   12:51 PM  CBC  WBC 4.0 - 10.5 K/uL 5.9   6.1   Hemoglobin 12.0 - 15.0 g/dL 12.3  13.6  13.5   Hematocrit 36.0 - 46.0 % 36.6  40.0  40.0   Platelets 150 - 400 K/uL 286      Lipid Panel     Component Value Date/Time   CHOL 160 08/04/2014 1437   TRIG 107 08/04/2014 1437   HDL 49 08/04/2014 1437   CHOLHDL 3.3 08/04/2014 1437   VLDL 21 08/04/2014 1437   LDLCALC 90 08/04/2014 1437   HEMOGLOBIN A1C Lab Results  Component Value Date   HGBA1C 10.5 (H) 04/15/2016   MPG 255 04/15/2016   TSH Recent Labs    08/30/21 0948  TSH 0.58    External labs: 05/29/2020: Sodium 137, potassium 4.0, BUN 15, glucose 131, creatinine 0.68, alk phos 285 (high), AST 20, ALT 23, GFR >90 Total cholesterol 133, triglycerides 67, HDL 52, LDL 68 A1c 8.0% Hemoglobin 13.5, hematocrit 41.9, MCV 82.0, platelets 227  Allergies  No Known Allergies   Medications Prior to Visit:   Outpatient Medications Prior to Visit  Medication Sig Dispense Refill   aspirin EC 81 MG tablet Take 81 mg by mouth daily.     atorvastatin (LIPITOR) 20  MG tablet TAKE 1 TABLET(20 MG) BY MOUTH DAILY 90 tablet 1    cholecalciferol (VITAMIN D3) 25 MCG (1000 UNIT) tablet Vitamin D     cilostazol (PLETAL) 50 MG tablet TAKE 1 TABLET(50 MG) BY MOUTH TWICE DAILY 180 tablet 3   dapagliflozin propanediol (FARXIGA) 10 MG TABS tablet Take 10 mg by mouth daily.     ezetimibe (ZETIA) 10 MG tablet Take 1 tablet (10 mg total) by mouth daily. 30 tablet 2   metFORMIN (GLUCOPHAGE) 500 MG tablet Take 1,000 mg by mouth 2 (two) times daily with a meal.      metoprolol succinate (TOPROL-XL) 25 MG 24 hr tablet TAKE 1 TABLET(25 MG) BY MOUTH DAILY 30 tablet 3   Multiple Vitamin (MULTIVITAMIN WITH MINERALS) TABS tablet Take 1 tablet by mouth daily. Hasn't had in a week     Phenylephrine-DM-GG (TUSSIN CF PO) Take by mouth.     TRULICITY 4.5 TM/1.9QQ SOPN Inject into the skin.     valsartan (DIOVAN) 40 MG tablet Take 1 tablet (40 mg total) by mouth daily. 30 tablet 3   No facility-administered medications prior to visit.   Final Medications at End of Visit    Current Meds  Medication Sig   aspirin EC 81 MG tablet Take 81 mg by mouth daily.   atorvastatin (LIPITOR) 20 MG tablet TAKE 1 TABLET(20 MG) BY MOUTH DAILY   cholecalciferol (VITAMIN D3) 25 MCG (1000 UNIT) tablet Vitamin D   cilostazol (PLETAL) 50 MG tablet TAKE 1 TABLET(50 MG) BY MOUTH TWICE DAILY   dapagliflozin propanediol (FARXIGA) 10 MG TABS tablet Take 10 mg by mouth daily.   ezetimibe (ZETIA) 10 MG tablet Take 1 tablet (10 mg total) by mouth daily.   metFORMIN (GLUCOPHAGE) 500 MG tablet Take 1,000 mg by mouth 2 (two) times daily with a meal.    metoprolol succinate (TOPROL-XL) 25 MG 24 hr tablet TAKE 1 TABLET(25 MG) BY MOUTH DAILY   Multiple Vitamin (MULTIVITAMIN WITH MINERALS) TABS tablet Take 1 tablet by mouth daily. Hasn't had in a week   Phenylephrine-DM-GG (TUSSIN CF PO) Take by mouth.   TRULICITY 4.5 IW/9.7LG SOPN Inject into the skin.   valsartan (DIOVAN) 40 MG tablet Take 1 tablet (40 mg total) by mouth daily.   Radiology   No results found.  Cardiac  Studies:   ABI 12/26/2018: This exam reveals mildly decreased perfusion of the right lower extremity, noted at the dorsalis pedis artery level (ABI 0.81). This exam reveals moderately decreased perfusion of the left lower extremity, noted at the dorsalis pedis artery level (ABI 0.69).  Compared to lower extremity arterial duplex/ABI done on 01/10/2018, no significant change.  Coronary angiogram 02/13/2012:  Mid LAD Overlapping 3.0 x 16 mm and2.75 x 38 mm Promus DES.  Diagonal vessels with ostial 95% stenosis, small calibered.  Mild disease in circumflex and right coronary artery.  EF 55%.  Lexiscan sestamibi stress test 04/11/2016: 1. The resting electrocardiogram demonstrated normal sinus rhythm, normal resting conduction, no resting arrhythmias and normal rest repolarization.  Low voltage and poor R progression. Stress EKG is non-diagnostic for ischemia as it a pharmacologic stress using Lexiscan. Stress symptoms included dyspnea. 2. Myocardial perfusion imaging is normal. Overall left ventricular systolic function was normal without regional wall motion abnormalities. The left ventricular ejection fraction was 66%.  This is a low risk study.  Lower extremity arterial duplex 01/10/2018: Diffuse calcific plaque bilaterally. Biphasic waveform right lower extremity without high grade stenosis. Right PT appears occluded. Left  proximal SFA >50% stenosis with calcific plaque.  This exam reveals mildly decreased perfusion of the right lower extremity, noted at the dorsalis pedis and post tibial artery level (ABI 0.86) and moderately decreased perfusion of the left lower extremity, noted at the dorsalis pedis and post tibial artery level (ABI 0.68).  Compared to the study done on 05/24/2016, left SFA stenosis is new.  Otherwise no significant change.  Carotid artery duplex 01/10/2018: Stenosis in the bilateral external carotid artery (<50%) with mild heterogeneous plaque. Source of bruit. Antegrade  right vertebral artery flow. Antegrade left vertebral artery flow. Follow up studies when clinically indicated.   EKG   02/09/2022: Sinus Rhythm. Normal axis. LAE. Cannot exclude old anteroseptal infarct. Unchanged from prior  02/09/2021: Sinus rhythm rate of 83 bpm.  Normal axis.  Left atrial enlargement.  Poor refreshing, cannot exclude anteroseptal infarct old.  Compared EKG 06/17/2020, no significant change.  07/30/2018: Normal sinus rhythm at 81 bpm, left atrial enlargement, normal axis, anterior infarct old. No evidence of ischemia.   Assessment   Coronary artery disease involving native coronary artery of native heart without angina pectoris - Plan: EKG 12-Lead  Type 2 diabetes mellitus with diabetic neuropathy, with long-term current use of insulin ([place])  Primary hypertension No orders of the defined types were placed in this encounter.  There are no discontinued medications.   Recommendations:   Vickie Little  is a 56 y.o. female  with hypertension, former tobacco use, diabetes, hyperlipidemia, CAD with NSTEMI in 2013 and underwent PCI to LAD  at that time. She has severe peripheral artery disease and also claudication involving PAD and neurogenic lumbar claudication, symptoms of claudication had improved since being on Pletal.    Coronary artery disease involving native coronary artery of native heart without angina pectoris Continue baby aspirin, statin, farxiga, toprol, valsartan   Type 2 diabetes mellitus with diabetic neuropathy, with long-term current use of insulin ([place]) Continue current regiment Managed by primary   Primary hypertension Will not make medication changes at this time Continue current cardiac medications. Encourage low-sodium diet, less than 2000 mg daily.   Patient checks BP at home and it is always <130/80 mmHg.   Follow-up in 1 year, sooner if needed, for hypertension, PAD, and CAD.     Vickie Flock, DO, [facility] 02/09/2022, 1:15  PM Office: [phone]

## 2022-03-17 ENCOUNTER — Ambulatory Visit
Admission: EM | Admit: 2022-03-17 | Discharge: 2022-03-17 | Disposition: A | Discharge status: Home / Self Care | Payer: 59 | Attending: Physician Assistant | Admitting: Physician Assistant

## 2022-03-17 ENCOUNTER — Encounter: Payer: Self-pay | Admitting: Emergency Medicine

## 2022-03-17 DIAGNOSIS — R040 Epistaxis: Secondary | ICD-10-CM

## 2022-03-17 MED ORDER — FLUTICASONE PROPIONATE 50 MCG/ACT NA SUSP: 1.0000 | Freq: Every day | NASAL | 0 refills | Status: DC

## 2022-03-17 NOTE — ED Provider Notes (Signed)
[facility]    CSN: [id number] Arrival date & time: 03/17/22  0806      History   Chief Complaint Chief Complaint  Patient presents with   Epistaxis    HPI Vickie Little is a 57 y.o. female.   Patient here today for evaluation of nose bleeds that have occurred intermittently over the last week She reports nosebleeds are only present to her left nostril. She is recovering from recent URI. She denies any fever or other symptoms. She has not tried treatment other than applying pressure and bending head forward. She reports that nosebleeds have lasted less than 5 minutes.   The history is provided by the patient.  Epistaxis Associated symptoms: congestion   Associated symptoms: no cough, no fever and no sore throat     Past Medical History:  Diagnosis Date   Arthritis    CAD (coronary artery disease), native coronary artery 07/30/2018   Diabetes mellitus without complication ([facility])    type II    Heart attack ([facility])    Hypertension    NSTEMI (non-ST elevated myocardial infarction) ([facility]) 02/08/2012    Patient Active Problem List   Diagnosis Date Noted   Elevated alkaline phosphatase level 08/30/2021   CAD (coronary artery disease), native coronary artery 07/30/2018   Spinal stenosis, lumbar region with neurogenic claudication 04/20/2016   Trichomonas vaginitis 06/23/2015   Diabetes mellitus type II, uncontrolled 02/10/2012   Hypertriglyceridemia 02/09/2012   UTI (lower urinary tract infection) 02/09/2012    Past Surgical History:  Procedure Laterality Date   CORONARY ANGIOPLASTY WITH STENT PLACEMENT     LEFT HEART CATHETERIZATION WITH CORONARY ANGIOGRAM N/A 02/13/2012   Procedure: LEFT HEART CATHETERIZATION WITH CORONARY ANGIOGRAM;  Surgeon: Donato Schultz, MD;  [facility] CATH LAB;  Service: Cardiovascular;  Laterality: N/A;   LUMBAR LAMINECTOMY/DECOMPRESSION MICRODISCECTOMY Left 04/20/2016   Procedure: 1. central decompression for spinal stenosis    1.central decompression for spinal stenosis L5-S1  2.foramionotomy L5 root and S1nerve root left       3. Microdisectomy L5-S1 left  ;  Surgeon: Ranee Gosselin, MD;  Location: [facility] ORS;  Service: Orthopedics;  Laterality: Left;    OB History   No obstetric history on file.      Home Medications    Prior to Admission medications   Medication Sig Start Date End Date Taking? Authorizing Provider  aspirin EC 81 MG tablet Take 81 mg by mouth daily.   Yes [provider]  atorvastatin (LIPITOR) 20 MG tablet TAKE 1 TABLET(20 MG) BY MOUTH DAILY 06/24/19  Yes Yates Decamp, MD  cholecalciferol (VITAMIN D3) 25 MCG (1000 UNIT) tablet Vitamin D   Yes [provider]  cilostazol (PLETAL) 50 MG tablet TAKE 1 TABLET(50 MG) BY MOUTH TWICE DAILY 08/20/21  Yes Cantwell, Celeste C, PA-C  dapagliflozin propanediol (FARXIGA) 10 MG TABS tablet Take 10 mg by mouth daily. 12/27/19  Yes [provider]  fluticasone (FLONASE) 50 MCG/ACT nasal spray Place 1 spray into both nostrils daily. 03/17/22  Yes Tomi Bamberger, PA-C  metFORMIN (GLUCOPHAGE) 500 MG tablet Take 1,000 mg by mouth 2 (two) times daily with a meal.    Yes [provider]  metoprolol succinate (TOPROL-XL) 25 MG 24 hr tablet TAKE 1 TABLET(25 MG) BY MOUTH DAILY 03/29/21  Yes Cantwell, Celeste C, PA-C  TRULICITY 4.5 MG/0.5ML SOPN Inject into the skin. 06/14/20  Yes [provider]  ezetimibe (ZETIA) 10 MG tablet Take 1 tablet (10 mg total) by mouth  daily. 01/04/19 02/09/22  Miquel Dunn, NP  Multiple Vitamin (MULTIVITAMIN WITH MINERALS) TABS tablet Take 1 tablet by mouth daily. Hasn't had in a week    [provider]  Phenylephrine-DM-GG (TUSSIN CF PO) Take by mouth.    [provider]    Family History Family History  Adopted: Yes  Problem Relation Age of Onset   Diabetes Father     Social History Social History   Tobacco Use   Smoking status: Former    Packs/day: 0.00    Years:  20.00    Total pack years: 0.00    Types: Cigarettes    Quit date: 08/22/2015    Years since quitting: 6.5   Smokeless tobacco: Never  Vaping Use   Vaping Use: Former  Substance Use Topics   Alcohol use: Yes    Comment: socially. 2-3 beers on weekend.   Drug use: No     Allergies   Patient has no known allergies.   Review of Systems Review of Systems  Constitutional:  Negative for chills and fever.  HENT:  Positive for congestion and nosebleeds. Negative for ear pain and sore throat.   Eyes:  Negative for discharge and redness.  Respiratory:  Negative for cough, shortness of breath and wheezing.   Gastrointestinal:  Negative for diarrhea, nausea and vomiting.     Physical Exam Triage Vital Signs ED Triage Vitals  Enc Vitals Group     BP      Pulse      Resp      Temp      Temp src      SpO2      Weight      Height      Head Circumference      Peak Flow      Pain Score      Pain Loc      Pain Edu?      Excl. in [place]?    No data found.  Updated Vital Signs BP (!) 148/86 (BP Location: Right Arm)   Pulse 76   Temp 97.8 F (36.6 C) (Oral)   Resp 16   LMP 03/25/2016   SpO2 96%      Physical Exam Vitals and nursing note reviewed.  Constitutional:      General: She is not in acute distress.    Appearance: Normal appearance. She is not ill-appearing.  HENT:     Head: Normocephalic and atraumatic.     Right Ear: Tympanic membrane normal.     Left Ear: Tympanic membrane normal.     Nose:     Comments: Bilateral turbinates inflamed    Mouth/Throat:     Mouth: Mucous membranes are moist.     Pharynx: No oropharyngeal exudate or posterior oropharyngeal erythema.  Eyes:     Conjunctiva/sclera: Conjunctivae normal.  Cardiovascular:     Rate and Rhythm: Normal rate.  Pulmonary:     Effort: Pulmonary effort is normal. No respiratory distress.  Skin:    General: Skin is warm and dry.  Neurological:     Mental Status: She is alert.  Psychiatric:         Mood and Affect: Mood normal.        Thought Content: Thought content normal.      UC Treatments / Results  Labs (all labs ordered are listed, but only abnormal results are displayed) Labs Reviewed - No data to display  EKG   Radiology No results found.  Procedures Procedures (including critical care time)  Medications Ordered in UC Medications - No data to display  Initial Impression / Assessment and Plan / UC Course  I have reviewed the triage vital signs and the nursing notes.  Pertinent labs & imaging results that were available during my care of the patient were reviewed by me and considered in my medical decision making (see chart for details).    Seemingly benign presentation of unilateral nosebleeds post URI. Will trial humidifier and flonase for congestion in hopes to alleviate further nosebleeds. Encouraged follow up with any further concerns.   Final Clinical Impressions(s) / UC Diagnoses   Final diagnoses:  Epistaxis   Discharge Instructions   None    ED Prescriptions     Medication Sig Dispense Auth. Provider   fluticasone (FLONASE) 50 MCG/ACT nasal spray Place 1 spray into both nostrils daily. 16 g Francene Finders, PA-C      PDMP not reviewed this encounter.   Francene Finders, PA-C 03/17/22 [phone]

## 2022-03-17 NOTE — ED Triage Notes (Signed)
Noticed new nose bleeds over the last week. Recently recovering from a URI. Denies any nasal spray usage

## 2022-04-25 ENCOUNTER — Ambulatory Visit
Admission: EM | Admit: 2022-04-25 | Discharge: 2022-04-25 | Disposition: A | Discharge status: Home / Self Care | Payer: 59 | Attending: Internal Medicine | Admitting: Internal Medicine

## 2022-04-25 DIAGNOSIS — J069 Acute upper respiratory infection, unspecified: Secondary | ICD-10-CM

## 2022-04-25 DIAGNOSIS — R051 Acute cough: Secondary | ICD-10-CM | POA: Diagnosis not present

## 2022-04-25 MED ORDER — BENZONATATE 100 MG PO CAPS: 100.0000 mg | ORAL_CAPSULE | Freq: Three times a day (TID) | ORAL | 0 refills | Status: DC | PRN

## 2022-04-25 MED ORDER — AMOXICILLIN-POT CLAVULANATE 875-125 MG PO TABS: 1.0000 | ORAL_TABLET | Freq: Two times a day (BID) | ORAL | 0 refills | Status: DC

## 2022-04-25 MED ORDER — CETIRIZINE HCL 10 MG PO TABS: 10.0000 mg | ORAL_TABLET | Freq: Every day | ORAL | 0 refills | Status: DC

## 2022-04-25 NOTE — Discharge Instructions (Signed)
I have prescribed an antibiotic, cough medication, allergy medication to alleviate your symptoms.  Please follow-up if symptoms persist or worsen.

## 2022-04-25 NOTE — ED Provider Notes (Signed)
[facility]    CSN: [id number] Arrival date & time: 04/25/22  1526      History   Chief Complaint Chief Complaint  Patient presents with   Nasal Congestion   Cough    HPI VELECIA KEGLER is a 57 y.o. female.   Patient presents with cough, nasal congestion, ear pain and pressure that has been present for 1 month.  Cough is mainly dry.  Patient reports that nasal mucus has been yellow to orange in color.  Patient denies any fevers or known sick contacts.  Denies chest pain, shortness of breath, gastrointestinal symptoms.  Patient has taken over-the-counter cold and flu medications with minimal improvement in symptoms.  Patient reports that she has been intermittently sick over the past 2 to 3 months and was treated with azithromycin in December.    Cough   Past Medical History:  Diagnosis Date   Arthritis    CAD (coronary artery disease), native coronary artery 07/30/2018   Diabetes mellitus without complication ([place])    type II    Heart attack ([place])    Hypertension    NSTEMI (non-ST elevated myocardial infarction) ([place]) 02/08/2012    Patient Active Problem List   Diagnosis Date Noted   Elevated alkaline phosphatase level 08/30/2021   CAD (coronary artery disease), native coronary artery 07/30/2018   Spinal stenosis, lumbar region with neurogenic claudication 04/20/2016   Trichomonas vaginitis 06/23/2015   Diabetes mellitus type II, uncontrolled 02/10/2012   Hypertriglyceridemia 02/09/2012   UTI (lower urinary tract infection) 02/09/2012    Past Surgical History:  Procedure Laterality Date   CORONARY ANGIOPLASTY WITH STENT PLACEMENT     LEFT HEART CATHETERIZATION WITH CORONARY ANGIOGRAM N/A 02/13/2012   Procedure: LEFT HEART CATHETERIZATION WITH CORONARY ANGIOGRAM;  Surgeon: Candee Furbish, MD;  Location: [facility] CATH LAB;  Service: Cardiovascular;  Laterality: N/A;   LUMBAR LAMINECTOMY/DECOMPRESSION MICRODISCECTOMY Left 04/20/2016   Procedure: 1. central  decompression for spinal stenosis   1.central decompression for spinal stenosis L5-S1  2.foramionotomy L5 root and S1nerve root left       3. Microdisectomy L5-S1 left  ;  Surgeon: Latanya Maudlin, MD;  Location: [facility] ORS;  Service: Orthopedics;  Laterality: Left;    OB History   No obstetric history on file.      Home Medications    Prior to Admission medications   Medication Sig Start Date End Date Taking? Authorizing Provider  amoxicillin-clavulanate (AUGMENTIN) 875-125 MG tablet Take 1 tablet by mouth every 12 (twelve) hours. 04/25/22  Yes Sherrye Puga, Hildred Alamin E, FNP  benzonatate (TESSALON) 100 MG capsule Take 1 capsule (100 mg total) by mouth every 8 (eight) hours as needed for cough. 04/25/22  Yes Jesstin Studstill, Michele Rockers, FNP  cetirizine (ZYRTEC) 10 MG tablet Take 1 tablet (10 mg total) by mouth daily. 04/25/22  Yes Oswaldo Conroy E, FNP  aspirin EC 81 MG tablet Take 81 mg by mouth daily.    [provider]  atorvastatin (LIPITOR) 20 MG tablet TAKE 1 TABLET(20 MG) BY MOUTH DAILY 06/24/19   Adrian Prows, MD  cholecalciferol (VITAMIN D3) 25 MCG (1000 UNIT) tablet Vitamin D    [provider]  cilostazol (PLETAL) 50 MG tablet TAKE 1 TABLET(50 MG) BY MOUTH TWICE DAILY 08/20/21   Cantwell, Celeste C, PA-C  dapagliflozin propanediol (FARXIGA) 10 MG TABS tablet Take 10 mg by mouth daily. 12/27/19   [provider]  ezetimibe (ZETIA) 10 MG tablet Take 1 tablet (10 mg total) by mouth daily. 01/04/19 02/09/22  Miquel Dunn, NP  fluticasone [facility]) 50 MCG/ACT nasal spray Place 1 spray into both nostrils daily. 03/17/22   Francene Finders, PA-C  metFORMIN (GLUCOPHAGE) 500 MG tablet Take 1,000 mg by mouth 2 (two) times daily with a meal.     [provider]  metoprolol succinate (TOPROL-XL) 25 MG 24 hr tablet TAKE 1 TABLET(25 MG) BY MOUTH DAILY 03/29/21   Cantwell, Celeste C, PA-C  Multiple Vitamin (MULTIVITAMIN WITH MINERALS) TABS tablet Take 1 tablet by mouth daily. Hasn't had in a week     [provider]  Phenylephrine-DM-GG (TUSSIN CF PO) Take by mouth.    [provider]  TRULICITY 4.5 0000000 SOPN Inject into the skin. 06/14/20   [provider]    Family History Family History  Adopted: Yes  Problem Relation Age of Onset   Diabetes Father     Social History Social History   Tobacco Use   Smoking status: Former    Packs/day: 0.00    Years: 20.00    Total pack years: 0.00    Types: Cigarettes    Quit date: 08/22/2015    Years since quitting: 6.6   Smokeless tobacco: Never  Vaping Use   Vaping Use: Former  Substance Use Topics   Alcohol use: Yes    Comment: socially. 2-3 beers on weekend.   Drug use: No     Allergies   Patient has no known allergies.   Review of Systems Review of Systems Per HPI  Physical Exam Triage Vital Signs ED Triage Vitals  Enc Vitals Group     BP 04/25/22 1706 (!) 146/91     Pulse Rate 04/25/22 1706 70     Resp 04/25/22 1706 12     Temp 04/25/22 1706 98.1 F (36.7 C)     Temp Source 04/25/22 1706 Oral     SpO2 04/25/22 1706 98 %     Weight --      Height --      Head Circumference --      Peak Flow --      Pain Score 04/25/22 1707 0     Pain Loc --      Pain Edu? --      Excl. in [place]? --    No data found.  Updated Vital Signs BP (!) 146/91 (BP Location: Left Arm)   Pulse 70   Temp 98.1 F (36.7 C) (Oral)   Resp 12   LMP 03/25/2016   SpO2 98%   Visual Acuity Right Eye Distance:   Left Eye Distance:   Bilateral Distance:    Right Eye Near:   Left Eye Near:    Bilateral Near:     Physical Exam Constitutional:      General: She is not in acute distress.    Appearance: Normal appearance. She is not toxic-appearing or diaphoretic.  HENT:     Head: Normocephalic and atraumatic.     Right Ear: Tympanic membrane and ear canal normal.     Left Ear: Tympanic membrane and ear canal normal.     Nose: Congestion present.     Mouth/Throat:     Mouth: Mucous membranes are  moist.     Pharynx: No posterior oropharyngeal erythema.  Eyes:     Extraocular Movements: Extraocular movements intact.     Conjunctiva/sclera: Conjunctivae normal.     Pupils: Pupils are equal, round, and reactive to light.  Cardiovascular:     Rate and Rhythm: Normal rate  and regular rhythm.     Pulses: Normal pulses.     Heart sounds: Normal heart sounds.  Pulmonary:     Effort: Pulmonary effort is normal. No respiratory distress.     Breath sounds: Normal breath sounds. No stridor. No wheezing, rhonchi or rales.  Abdominal:     General: Abdomen is flat. Bowel sounds are normal.     Palpations: Abdomen is soft.  Musculoskeletal:        General: Normal range of motion.     Cervical back: Normal range of motion.  Skin:    General: Skin is warm and dry.  Neurological:     General: No focal deficit present.     Mental Status: She is alert and oriented to person, place, and time. Mental status is at baseline.  Psychiatric:        Mood and Affect: Mood normal.        Behavior: Behavior normal.      UC Treatments / Results  Labs (all labs ordered are listed, but only abnormal results are displayed) Labs Reviewed - No data to display  EKG   Radiology No results found.  Procedures Procedures (including critical care time)  Medications Ordered in UC Medications - No data to display  Initial Impression / Assessment and Plan / UC Course  I have reviewed the triage vital signs and the nursing notes.  Pertinent labs & imaging results that were available during my care of the patient were reviewed by me and considered in my medical decision making (see chart for details).     Given duration of symptoms, I am concerned for acute sinusitis.  Will opt to treat with Augmentin antibiotic.  Will also add cetirizine antihistamine and cough medication.  There are no adventitious lung sounds on exam so do not think that chest imaging is necessary.  Advised following up with ENT  specialty at provided contact information given patient has been intermittently having upper respiratory symptoms over the past 2 to 3 months.  Advised strict return precautions.  Patient verbalized understanding was agreeable with plan. Final Clinical Impressions(s) / UC Diagnoses   Final diagnoses:  Acute upper respiratory infection  Acute cough     Discharge Instructions      I have prescribed an antibiotic, cough medication, allergy medication to alleviate your symptoms.  Please follow-up if symptoms persist or worsen.    ED Prescriptions     Medication Sig Dispense Auth. Provider   amoxicillin-clavulanate (AUGMENTIN) 875-125 MG tablet Take 1 tablet by mouth every 12 (twelve) hours. 14 tablet [place], [place] E, [place]   benzonatate (TESSALON) 100 MG capsule Take 1 capsule (100 mg total) by mouth every 8 (eight) hours as needed for cough. 21 capsule [place], [place] E, [place]   cetirizine (ZYRTEC) 10 MG tablet Take 1 tablet (10 mg total) by mouth daily. 30 tablet [place], Michele Rockers, [place]      PDMP not reviewed this encounter.   Teodora Medici, [place] 04/25/22 1739

## 2022-04-25 NOTE — ED Triage Notes (Signed)
Pt presents with continued cough, congestion, and ear pressure x 1 month.

## 2022-05-23 ENCOUNTER — Encounter: Payer: Self-pay | Admitting: Internal Medicine

## 2022-05-23 ENCOUNTER — Ambulatory Visit: Payer: 59 | Admitting: Internal Medicine

## 2022-05-23 VITALS — BP 124/70 | HR 76 | Ht 67.0 in | Wt 187.0 lb

## 2022-05-23 DIAGNOSIS — R748 Abnormal levels of other serum enzymes: Secondary | ICD-10-CM | POA: Diagnosis not present

## 2022-05-23 DIAGNOSIS — M859 Disorder of bone density and structure, unspecified: Secondary | ICD-10-CM | POA: Diagnosis not present

## 2022-05-23 DIAGNOSIS — E559 Vitamin D deficiency, unspecified: Secondary | ICD-10-CM | POA: Diagnosis not present

## 2022-05-23 NOTE — Progress Notes (Unsigned)
Name: Vickie Little  MRN/ DOB: [id number], [birth year]    Age/ Sex: 57 y.o., female    PCP: Bartholome Bill, MD   Reason for Endocrinology Evaluation: Elevated Alkaline Phophatase     Date of Initial Endocrinology Evaluation: 11/02/2018    HPI: [name] is a 57 y.o. female with a past medical history of HTN, Dyslipidemia, Hx of pancreatitis, Diabetes and CAD. The patient presented for initial endocrinology clinic visit on 11/02/2018 for consultative assistance with her elevated alkaline phosphatase.   Pt was referred here for evaluation of elevated alkaline phosphatase  Patient with history of ankle fracture in her 32s, due to after falling in a hole On her initial visit to our clinic vitamin D deficiency has resolved  Alkaline phosphatase assess times showed elevated bone fracture at 75% with normal intestinal fraction of 4% and liver fraction 21% DXA scan showed low bone density  TFTs, calcium, and PTH have all come back normal   Bone scan in October 2020 showed degenerative joint disease, otherwise exam was normal   She is adopted but found out that her sister and her mother have elevated alkaline phosphatase   She was lost to follow up until her return in 08/2021   SUBJECTIVE:    Today (05/23/22):  Ms. [name] is here for a follow up on elevated alkaline phosphatase and low bone density    Denies recent falls or broken bones  Denies recent aches or pains  Has occasional constipation which she is increasing fiber and water intake  Has rare nausea  Has left shoulder cramps and spasms  Has rare tingling sensation down her back   She continues on Vitamin D 2000 iu daily  She is not on calcium except through [facility]   She is on Zyrtec for allergies     HISTORY:  Past Medical History:  Past Medical History:  Diagnosis Date   Arthritis    CAD (coronary artery disease), native coronary artery 07/30/2018   Diabetes mellitus without complication     type II    Heart attack    Hypertension    NSTEMI (non-ST elevated myocardial infarction) 02/08/2012   Past Surgical History:  Past Surgical History:  Procedure Laterality Date   CORONARY ANGIOPLASTY WITH STENT PLACEMENT     LEFT HEART CATHETERIZATION WITH CORONARY ANGIOGRAM N/A 02/13/2012   Procedure: LEFT HEART CATHETERIZATION WITH CORONARY ANGIOGRAM;  Surgeon: Candee Furbish, MD;  Location: [facility] CATH LAB;  Service: Cardiovascular;  Laterality: N/A;   LUMBAR LAMINECTOMY/DECOMPRESSION MICRODISCECTOMY Left 04/20/2016   Procedure: 1. central decompression for spinal stenosis   1.central decompression for spinal stenosis L5-S1  2.foramionotomy L5 root and S1nerve root left       3. Microdisectomy L5-S1 left  ;  Surgeon: Latanya Maudlin, MD;  Location: [facility] ORS;  Service: Orthopedics;  Laterality: Left;    Social History:  reports that she quit smoking about 6 years ago. Her smoking use included cigarettes. She has never used smokeless tobacco. She reports current alcohol use. She reports that she does not use drugs. Family History: family history includes Diabetes in her father. She was adopted.   HOME MEDICATIONS: Allergies as of 05/23/2022   No Known Allergies      Medication List        Accurate as of May 23, 2022  1:40 PM. If you have any questions, ask your nurse or doctor.          STOP taking these medications  amoxicillin-clavulanate 875-125 MG tablet Commonly known as: AUGMENTIN Stopped by: Dorita Sciara, MD   benzonatate 100 MG capsule Commonly known as: TESSALON Stopped by: Dorita Sciara, MD       TAKE these medications    aspirin EC 81 MG tablet Take 81 mg by mouth daily.   atorvastatin 20 MG tablet Commonly known as: LIPITOR TAKE 1 TABLET(20 MG) BY MOUTH DAILY   cetirizine 10 MG tablet Commonly known as: ZYRTEC Take 1 tablet (10 mg total) by mouth daily.   cholecalciferol 25 MCG (1000 UNIT) tablet Commonly known as: VITAMIN D3 Vitamin  D   cilostazol 50 MG tablet Commonly known as: PLETAL TAKE 1 TABLET(50 MG) BY MOUTH TWICE DAILY   dapagliflozin propanediol 10 MG Tabs tablet Commonly known as: FARXIGA Take 10 mg by mouth daily.   ezetimibe 10 MG tablet Commonly known as: ZETIA Take 1 tablet (10 mg total) by mouth daily.   fluticasone 50 MCG/ACT nasal spray Commonly known as: FLONASE Place 1 spray into both nostrils daily.   glimepiride 2 MG tablet Commonly known as: AMARYL Take 2 mg by mouth daily with breakfast.   metFORMIN 500 MG tablet Commonly known as: GLUCOPHAGE Take 1,000 mg by mouth 2 (two) times daily with a meal.   metoprolol succinate 25 MG 24 hr tablet Commonly known as: TOPROL-XL TAKE 1 TABLET(25 MG) BY MOUTH DAILY   multivitamin with minerals Tabs tablet Take 1 tablet by mouth daily. Hasn't had in a week   Trulicity 4.5 0000000 Sopn Generic drug: Dulaglutide Inject into the skin.   TUSSIN CF PO Take by mouth.             OBJECTIVE:  VS: BP 124/70 (BP Location: Left Arm, Patient Position: Sitting, Cuff Size: Large)   Pulse 76   Ht 5\' 7"  (1.702 m)   Wt 187 lb (84.8 kg)   LMP 03/25/2016   SpO2 95%   BMI 29.29 kg/m    Wt Readings from Last 3 Encounters:  05/23/22 187 lb (84.8 kg)  02/09/22 191 lb (86.6 kg)  08/30/21 186 lb (84.4 kg)     EXAM: General: Pt appears well and is in NAD  Neck: General: Supple without adenopathy. Thyroid: Thyroid size normal.  No goiter or nodules appreciated.  Lungs: Clear with good BS bilat   Heart: Auscultation: RRR.  Abdomen: Normoactive bowel sounds, soft, nontender, without masses or organomegaly palpable  Extremities:  BL LE: No pretibial edema normal ROM and strength.  Mental Status: Judgment, insight: Intact Orientation: Oriented to time, place, and person Mood and affect: No depression, anxiety, or agitation     DATA REVIEWED:  Latest Reference Range & Units 08/30/21 09:48  BONE FRACTION 14 - 68 % 69 (H)  INTESTINAL  FRAC. 0 - 18 % 5  LIVER FRACTION 18 - 85 % 26  VITD 30.00 - 100.00 ng/mL 32.62     Latest Reference Range & Units 08/30/21 09:48  C-Telopeptide (CTx)  pg/mL 304        08/30/2018  Alk Phos 314 (35-104) Liver 1% 17.2 (27.8-76.3%)                 58.1 (16.2-70.2) Liver2 %   0.0 Bone % 82.8 (19.1-67.7)  Bone 279.9     (12.1-42.7) IU/L  Intestinal %   0   DXA 09/22/2021  AP spine (L1-L3) T score -0.9 Z-score 0.1   LFN  T score -1.6 Z-score -0.6  Left total hip  T score -  1.5 Z score -0.9   FRAX MOF 11% , Hip 1%   DXA 10/01/2018 AP LUMBAR SPINE L1-L3  Bone Mineral Density (BMD):  0.988 g/cm2  Young Adult T-Score:  -0.3  Z-Score:  0.5  LEFT FEMUR TOTAL  Bone Mineral Density (BMD):  0.791 g/cm2  Young Adult T-Score: -1.2  Z-Score:  -0.7   ASSESSMENT/PLAN/RECOMMENDATIONS:   Elevated Alkaline Phosphatase:    - Elevated Alkaline phosphatase of bone origin is due to increase osteoblastic activity.  -Her lab work has excluded hyperthyroidis, hyperparathyroidism, osteomalacia - DXA scan shows osteopenia - Bone scan in 2020 showed DJD  otherwise normal (with no evidence of Paget's disease) -We will proceed with 24-hour urinary collection for calcium and cortisol -Patient will return for fasting labs to include CMP, PTH, vitamin D, phosphorus and magnesium -Patient understands that she will return for fasting labs  2. Hx Vitamin D Deficiency :   - Resolved   Medications: Vitamin D 2000 iu daily   3. Low Bone density :  - Encouraged optimization of calcium and vitamin D - will be due for repeat DXA scan 09/2023  F/u in 6 months       Signed electronically by: Mack Guise, MD  [facility] Endocrinology  [address] [place], [place] [address] Phone: [phone] FAX: [phone]   CC: [address], [place] [address] Phone: [phone] Fax:  [phone]   Return to Endocrinology clinic as below: Future Appointments  Date Time Provider [place]  02/10/2023  1:00 PM Custovic, Collene Mares, DO [facility] None

## 2022-05-23 NOTE — Patient Instructions (Signed)

## 2022-05-30 ENCOUNTER — Other Ambulatory Visit (INDEPENDENT_AMBULATORY_CARE_PROVIDER_SITE_OTHER): Payer: 59

## 2022-05-30 ENCOUNTER — Other Ambulatory Visit: Payer: Self-pay | Admitting: Internal Medicine

## 2022-05-30 DIAGNOSIS — R748 Abnormal levels of other serum enzymes: Secondary | ICD-10-CM

## 2022-05-30 LAB — CBC
HCT: 41.6 % (ref 36.0–46.0)
Hemoglobin: 13.3 g/dL (ref 12.0–15.0)
MCHC: 32 g/dL (ref 30.0–36.0)
MCV: 80.8 fl (ref 78.0–100.0)
Platelets: 238 10*3/uL (ref 150.0–400.0)
RBC: 5.15 Mil/uL — ABNORMAL HIGH (ref 3.87–5.11)
RDW: 15.1 % (ref 11.5–15.5)
WBC: 4.7 10*3/uL (ref 4.0–10.5)

## 2022-05-30 LAB — VITAMIN D 25 HYDROXY (VIT D DEFICIENCY, FRACTURES): VITD: 32.32 ng/mL (ref 30.00–100.00)

## 2022-05-30 LAB — PHOSPHORUS: Phosphorus: 4.5 mg/dL (ref 2.3–4.6)

## 2022-05-30 LAB — MAGNESIUM: Magnesium: 1.8 mg/dL (ref 1.5–2.5)

## 2022-05-31 LAB — PARATHYROID HORMONE, INTACT (NO CA): PTH: 27 pg/mL (ref 16–77)

## 2022-06-02 LAB — CALCIUM, URINE, 24 HOUR: Calcium, 24H Urine: 80 mg/24 h

## 2022-06-02 LAB — VITAMIN A: Vitamin A (Retinoic Acid): 88 ug/dL (ref 38–98)

## 2022-06-04 LAB — VITAMIN D 1,25 DIHYDROXY
Vitamin D 1, 25 (OH)2 Total: 33 pg/mL (ref 18–72)
Vitamin D2 1, 25 (OH)2: <8 pg/mL
Vitamin D3 1, 25 (OH)2: 33 pg/mL

## 2022-06-04 LAB — CORTISOL, URINE, 24 HOUR
24 Hour urine volume (VMAHVA): 1450 mL
CREATININE, URINE: 0.76 g/(24.h) (ref 0.50–2.15)
Cortisol (Ur), Free: 31.8 mcg/24 h (ref 4.0–50.0)

## 2022-06-04 LAB — PHOSPHORUS, URINE, RANDOM: PHSOPHATE, RANDOM URINE: 23 mg/dL

## 2022-06-04 LAB — CREATININE, URINE, RANDOM: Creatinine, Urine: 54 mg/dL (ref 20–275)

## 2022-06-06 ENCOUNTER — Telehealth: Payer: Self-pay

## 2022-06-06 LAB — FIBROBLAST GROWTH FACTOR 23: FGF-23 Fibroblast Growth Factor: 83 RU/mL

## 2022-06-06 NOTE — Telephone Encounter (Signed)
Patient calling for lab results  from 05/30/22

## 2022-06-07 NOTE — Telephone Encounter (Signed)
LMTCB

## 2022-06-08 NOTE — Telephone Encounter (Signed)
Patient will wait until she see her pcp in May and if levels are still evaluated she will contact us to place referral

## 2022-07-29 ENCOUNTER — Ambulatory Visit: Payer: 59 | Admitting: Internal Medicine

## 2022-07-29 ENCOUNTER — Encounter: Payer: Self-pay | Admitting: Internal Medicine

## 2022-07-29 VITALS — BP 142/82 | HR 83 | Ht 67.0 in | Wt 192.0 lb

## 2022-07-29 DIAGNOSIS — M859 Disorder of bone density and structure, unspecified: Secondary | ICD-10-CM | POA: Diagnosis not present

## 2022-07-29 DIAGNOSIS — E559 Vitamin D deficiency, unspecified: Secondary | ICD-10-CM

## 2022-07-29 DIAGNOSIS — R748 Abnormal levels of other serum enzymes: Secondary | ICD-10-CM | POA: Diagnosis not present

## 2022-07-29 NOTE — Progress Notes (Signed)
Name: Vickie Little  MRN/ DOB: [id number], 06-Jan-1966    Age/ Sex: 57 y.o., female    PCP: Verlon Au, MD   Reason for Endocrinology Evaluation: Elevated Alkaline Phophatase     Date of Initial Endocrinology Evaluation: 11/02/2018    HPI: Vickie Little is a 57 y.o. female with a past medical history of HTN, Dyslipidemia, Hx of pancreatitis, Diabetes and CAD. The patient presented for initial endocrinology clinic visit on 11/02/2018 for consultative assistance with her elevated alkaline phosphatase.   Pt was referred here for evaluation of elevated alkaline phosphatase  Patient with history of ankle fracture in her 62s, due to after falling in a hole On her initial visit to our clinic vitamin D deficiency has resolved  Alkaline phosphatase isozymes  showed elevated bone fracture at 75% with normal intestinal fraction of 4% and liver fraction 21% DXA scan showed low bone density  Bone scan in October 2020 showed degenerative joint disease, otherwise exam was normal   She is adopted but found out that her sister and her mother have elevated alkaline phosphatase.  Sister was subsequently diagnosed with primary biliary cholangitis   She was lost to follow up until her return in 08/2021   Patient continued with elevation of alkaline phosphatase of bone origin, phosphorus, magnesium, PTH, calcium, vitamin D, FGF 23, 24-hour urine calcium and cortisol have all come back normal   SUBJECTIVE:    Today (07/29/22):  Vickie Little is here for a follow up on elevated alkaline phosphatase and low bone density   Sister with elevated Alkaline phosphatase but found to Primary biliary cholangitis  Denies recent falls or fractures  Denies recent aches or pains  Constipation has resolved  Continues with  rare nausea  Has dry skin  No tingling, numbness No muscle spasm or cramps  Vitamin D 1000 iu daily  Calcium 500 mg daily  She is On MVI      HISTORY:  Past  Medical History:  Past Medical History:  Diagnosis Date   Arthritis    CAD (coronary artery disease), native coronary artery 07/30/2018   Diabetes mellitus without complication (HCC)    type II    Heart attack (HCC)    Hypertension    NSTEMI (non-ST elevated myocardial infarction) (HCC) 02/08/2012   Past Surgical History:  Past Surgical History:  Procedure Laterality Date   CORONARY ANGIOPLASTY WITH STENT PLACEMENT     LEFT HEART CATHETERIZATION WITH CORONARY ANGIOGRAM N/A 02/13/2012   Procedure: LEFT HEART CATHETERIZATION WITH CORONARY ANGIOGRAM;  Surgeon: Donato Schultz, MD;  Location: [facility] CATH LAB;  Service: Cardiovascular;  Laterality: N/A;   LUMBAR LAMINECTOMY/DECOMPRESSION MICRODISCECTOMY Left 04/20/2016   Procedure: 1. central decompression for spinal stenosis   1.central decompression for spinal stenosis L5-S1  2.foramionotomy L5 root and S1nerve root left       3. Microdisectomy L5-S1 left  ;  Surgeon: Ranee Gosselin, MD;  Location: [facility]S;  Service: Orthopedics;  Laterality: Left;    Social History:  reports that she quit smoking about 6 years ago. Her smoking use included cigarettes. She has never used smokeless tobacco. She reports current alcohol use. She reports that she does not use drugs. Family History: family history includes Diabetes in her father. She was adopted.   HOME MEDICATIONS: Allergies as of 07/29/2022   No Known Allergies      Medication List        Accurate as of July 29, 2022  3:55 PM. If  you have any questions, ask your nurse or doctor.          aspirin EC 81 MG tablet Take 81 mg by mouth daily.   atorvastatin 20 MG tablet Commonly known as: LIPITOR TAKE 1 TABLET(20 MG) BY MOUTH DAILY   cetirizine 10 MG tablet Commonly known as: ZYRTEC Take 1 tablet (10 mg total) by mouth daily.   cholecalciferol 25 MCG (1000 UNIT) tablet Commonly known as: VITAMIN D3 Vitamin D   cilostazol 50 MG tablet Commonly known as: PLETAL TAKE 1 TABLET(50 MG) BY  MOUTH TWICE DAILY   dapagliflozin propanediol 10 MG Tabs tablet Commonly known as: FARXIGA Take 10 mg by mouth daily.   ezetimibe 10 MG tablet Commonly known as: ZETIA Take 1 tablet (10 mg total) by mouth daily.   fluticasone 50 MCG/ACT nasal spray Commonly known as: FLONASE Place 1 spray into both nostrils daily.   glimepiride 2 MG tablet Commonly known as: AMARYL Take 2 mg by mouth daily with breakfast.   metFORMIN 500 MG tablet Commonly known as: GLUCOPHAGE Take 1,000 mg by mouth 2 (two) times daily with a meal.   metoprolol succinate 25 MG 24 hr tablet Commonly known as: TOPROL-XL TAKE 1 TABLET(25 MG) BY MOUTH DAILY   multivitamin with minerals Tabs tablet Take 1 tablet by mouth daily. Hasn't had in a week   Trulicity 4.5 MG/0.5ML Sopn Generic drug: Dulaglutide Inject into the skin.   TUSSIN CF PO Take by mouth.             OBJECTIVE:  VS: BP (!) 142/82 (BP Location: Left Arm, Patient Position: Sitting, Cuff Size: Normal)   Pulse 83   Ht 5\' 7"  (1.702 m)   Wt 192 lb (87.1 kg)   LMP 03/25/2016   SpO2 99%   BMI 30.07 kg/m    Wt Readings from Last 3 Encounters:  07/29/22 192 lb (87.1 kg)  05/23/22 187 lb (84.8 kg)  02/09/22 191 lb (86.6 kg)     EXAM: General: Pt appears well and is in NAD  Neck: General: Supple without adenopathy. Thyroid: Thyroid size normal.  No goiter or nodules appreciated.  Lungs: Clear with good BS bilat   Heart: Auscultation: RRR.  Abdomen: Soft, nontender  Extremities:  BL LE: No pretibial edema   Mental Status: Judgment, insight: Intact Orientation: Oriented to time, place, and person Mood and affect: No depression, anxiety, or agitation     DATA REVIEWED:   Latest Reference Range & Units 05/30/22 08:34  Phosphorus 2.3 - 4.6 mg/dL 4.5  Magnesium 1.5 - 2.5 mg/dL 1.8     Latest Reference Range & Units 05/30/22 08:34  VITD 30.00 - 100.00 ng/mL 32.32  Vitamin A (Retinoic Acid) 38 - 98 mcg/dL 88  Vitamin D 1,  25 (OH) Total 18 - 72 pg/mL 33  Vitamin D2 1, 25 (OH) pg/mL <8  Vitamin D3 1, 25 (OH) pg/mL 33  WBC 4.0 - 10.5 K/uL 4.7  RBC 3.87 - 5.11 Mil/uL 5.15 (H)  Hemoglobin 12.0 - 15.0 g/dL 16.1  HCT 09.6 - 04.5 % 41.6  MCV 78.0 - 100.0 fl 80.8  MCHC 30.0 - 36.0 g/dL 40.9  RDW 81.1 - 91.4 % 15.1  Platelets 150.0 - 400.0 K/uL 238.0    Latest Reference Range & Units 05/30/22 08:34  PTH, Intact 16 - 77 pg/mL 27    Latest Reference Range & Units 05/30/22 08:34  Creatinine, Urine 0.50 - 2.15 g/24 h 20 - 275 mg/dL 7.82 54  Calcium, 24H Urine mg/24 h 80    Latest Reference Range & Units 05/30/22 08:34 05/30/22 09:28  24 Hour urine volume (VMAHVA) mL 1,450   Cortisol (Ur), Free 4.0 - 50.0 mcg/24 h 31.8   FGF-23 Fibroblast Growth Factor RU/mL  83  PHOSPHATE, RANDOM URINE  mg/dL 23    Phosphatase 44 - 121 IU/L 384 High   Alkaline Phosphatase Liver Fraction 18 - 85 % 19  Alkaline Phosphatase Bone Fraction 14 - 68 % 79 High   Alkaline Phosphatase Intestine Fraction 0 - 18 % 2      Latest Reference Range & Units 08/30/21 09:48  BONE FRACTION 14 - 68 % 69 (H)  INTESTINAL FRAC. 0 - 18 % 5  LIVER FRACTION 18 - 85 % 26  VITD 30.00 - 100.00 ng/mL 32.62     Latest Reference Range & Units 08/30/21 09:48  C-Telopeptide (CTx)  pg/mL 304        08/30/2018  Alk Phos 314 (35-104) Liver 1% 17.2 (27.8-76.3%)                 58.1 (16.2-70.2) Liver2 %   0.0 Bone % 82.8 (19.1-67.7)  Bone 279.9     (12.1-42.7) IU/L  Intestinal %   0   DXA 09/22/2021  AP spine (L1-L3) T score -0.9 Z-score 0.1   LFN  T score -1.6 Z-score -0.6  Left total hip  T score -1.5 Z score -0.9   FRAX MOF 11% , Hip 1%   DXA 10/01/2018 AP LUMBAR SPINE L1-L3  Bone Mineral Density (BMD):  0.988 g/cm2  Young Adult T-Score:  -0.3  Z-Score:  0.5  LEFT FEMUR TOTAL  Bone Mineral Density (BMD):  0.791 g/cm2  Young Adult T-Score: -1.2  Z-Score:  -0.7   ASSESSMENT/PLAN/RECOMMENDATIONS:   Elevated  Alkaline Phosphatase:    -Alkaline phosphatase remains elevated with bone origin -She is concerned today because her sister who has had a history of elevated alkaline phosphatase was diagnosed with primary biliary cholangitis, I did assure the patient that her alkaline phosphatase is of bone origin and not of hepatic origin - DXA scan shows osteopenia - Bone scan in 2020 showed DJD  otherwise normal (with no evidence of Paget's disease) -Spot urine for phosphorus was normal at 23 mg/DL,Normal serum phosphorus 4.5 mg/DL, normal magnesium 1.8 Mg/DL, FGF 23 at 83 IU/mL, vitamin D 1, 25 dihydroxy vitamin D 33 PG/mL,Normal vitamin A, normal PTH 27 PG/mL, 24-hour urinary cortisol 31.8 -Repeat alkaline phosphatase isoenzymes 06/2022 continue to show the majority is of bone origin at 79%, liver fraction 19%, and does not fraction 2% -At this point I have exhausted all workup that I am knowledgeable off, and I will refer her to [facility] for second opinion  2. Hx Vitamin D Deficiency :   - Resolved  -Patient advised to increase vitamin D  Medications: Increase Vitamin D 2000 iu daily   3. Low Bone density :  - Encouraged optimization of calcium and vitamin D - will be due for repeat DXA scan 09/2023    I spent 50 minutes preparing to see the patient by review of recent labs, imaging and procedures, obtaining and reviewing separately obtained history, communicating with the patient/family or caregiver, ordering medications, tests or procedures, and documenting clinical information in the EHR including the differential Dx, treatment, and any further evaluation and other management   Signed electronically by: Lyndle Herrlich, MD  [facility]  [facility] [address] [place], [place] [address] Phone: [phone] FAX: [phone]   CC: Verlon Au, MD [address] [place] [address] Phone:  [phone] Fax: [phone]   Return to Endocrinology clinic as below: Future Appointments  Date Time Provider Department Center  11/23/2022  1:20 PM Keevon Henney, Konrad Dolores, MD [facility] None  02/10/2023  1:00 PM Custovic, Rozell Searing, DO [facility] None

## 2022-09-13 ENCOUNTER — Ambulatory Visit
Admission: EM | Admit: 2022-09-13 | Discharge: 2022-09-13 | Disposition: A | Discharge status: Home / Self Care | Payer: 59 | Attending: Family Medicine | Admitting: Family Medicine

## 2022-09-13 DIAGNOSIS — Z20822 Contact with and (suspected) exposure to covid-19: Secondary | ICD-10-CM | POA: Diagnosis not present

## 2022-09-13 DIAGNOSIS — J069 Acute upper respiratory infection, unspecified: Secondary | ICD-10-CM | POA: Insufficient documentation

## 2022-09-13 NOTE — Discharge Instructions (Addendum)
Recommend symptom treatment with cough and cold medication. Hydrate well with fluids. Your COVID test results will be available within 24 hours our office will call you only if your results are positive.  All results are viewable via MyChart. If you feel at any point your symptoms worsen return for evaluation if your symptoms become severe and include any difficulty breathing or involve any form of chest pain go to the emergency department.

## 2022-09-13 NOTE — ED Triage Notes (Signed)
Patient here today with c/o runny nose, headache, chills, and cough since yesterday. She went to [place] through the 15th to the 21st. Patient states that 20 people went and 6 of her family members tested positive for Covid.

## 2022-09-13 NOTE — ED Provider Notes (Signed)
[facility]    CSN: [id number] Arrival date & time: 09/13/22  1055      History   Chief Complaint Chief Complaint  Patient presents with   Cough    HPI Vickie Little is a 57 y.o. female.   HPI Patient presents with URI symptoms including cough, sore throat, otalgia, nasal congestion, runny nose, and sinus pressure. Denies worrisome symptoms of shortness of breath, nausea, or abdominal pain.  She is recent exposure to COVID as she was at a family event and at least 6 members have already tested positive for COVID. Past Medical History:  Diagnosis Date   Arthritis    CAD (coronary artery disease), native coronary artery 07/30/2018   Diabetes mellitus without complication (HCC)    type II    Heart attack (HCC)    Hypertension    NSTEMI (non-ST elevated myocardial infarction) (HCC) 02/08/2012    Patient Active Problem List   Diagnosis Date Noted   Elevated alkaline phosphatase level 08/30/2021   CAD (coronary artery disease), native coronary artery 07/30/2018   Spinal stenosis, lumbar region with neurogenic claudication 04/20/2016   Trichomonas vaginitis 06/23/2015   Diabetes mellitus type II, uncontrolled 02/10/2012   Hypertriglyceridemia 02/09/2012   UTI (lower urinary tract infection) 02/09/2012    Past Surgical History:  Procedure Laterality Date   CORONARY ANGIOPLASTY WITH STENT PLACEMENT     LEFT HEART CATHETERIZATION WITH CORONARY ANGIOGRAM N/A 02/13/2012   Procedure: LEFT HEART CATHETERIZATION WITH CORONARY ANGIOGRAM;  Surgeon: Donato Schultz, MD;  Location: [facility] CATH LAB;  Service: Cardiovascular;  Laterality: N/A;   LUMBAR LAMINECTOMY/DECOMPRESSION MICRODISCECTOMY Left 04/20/2016   Procedure: 1. central decompression for spinal stenosis   1.central decompression for spinal stenosis L5-S1  2.foramionotomy L5 root and S1nerve root left       3. Microdisectomy L5-S1 left  ;  Surgeon: Ranee Gosselin, MD;  Location: [facility];  Service: Orthopedics;   Laterality: Left;    OB History   No obstetric history on file.      Home Medications    Prior to Admission medications   Medication Sig Start Date End Date Taking? Authorizing Provider  aspirin EC 81 MG tablet Take 81 mg by mouth daily.   Yes [provider]  atorvastatin (LIPITOR) 20 MG tablet TAKE 1 TABLET(20 MG) BY MOUTH DAILY 06/24/19  Yes Yates Decamp, MD  cholecalciferol (VITAMIN D3) 25 MCG (1000 UNIT) tablet Vitamin D   Yes [provider]  dapagliflozin propanediol (FARXIGA) 10 MG TABS tablet Take 10 mg by mouth daily. 12/27/19  Yes [provider]  glimepiride (AMARYL) 2 MG tablet Take 2 mg by mouth daily with breakfast. 04/07/22  Yes [provider]  metFORMIN (GLUCOPHAGE) 500 MG tablet Take 1,000 mg by mouth 2 (two) times daily with a meal.    Yes [provider]  Multiple Vitamin (MULTIVITAMIN WITH MINERALS) TABS tablet Take 1 tablet by mouth daily. Hasn't had in a week   Yes [provider]  Semaglutide,0.25 or 0.5MG /DOS, (OZEMPIC, 0.25 OR 0.5 MG/DOSE,) 2 MG/1.5ML SOPN Inject 0.5 mg into the skin once a week.   Yes [provider]  cetirizine (ZYRTEC) 10 MG tablet Take 1 tablet (10 mg total) by mouth daily. 04/25/22   Gustavus Bryant, FNP  cilostazol (PLETAL) 50 MG tablet TAKE 1 TABLET(50 MG) BY MOUTH TWICE DAILY 08/20/21   Cantwell, Celeste C, PA-C  ezetimibe (ZETIA) 10 MG tablet Take 1 tablet (10 mg total) by mouth daily. 01/04/19 02/09/22  Toniann Fail, NP  fluticasone [facility]) 50 MCG/ACT nasal spray Place 1 spray into both nostrils daily. 03/17/22   Tomi Bamberger, PA-C  metoprolol succinate (TOPROL-XL) 25 MG 24 hr tablet TAKE 1 TABLET(25 MG) BY MOUTH DAILY 03/29/21   Cantwell, Celeste C, PA-C  Phenylephrine-DM-GG (TUSSIN CF PO) Take by mouth. Patient not taking: Reported on 05/23/2022    [provider]  TRULICITY 4.5 MG/0.5ML SOPN Inject into the skin. Patient not taking: Reported on 07/29/2022 06/14/20    [provider]    Family History Family History  Adopted: Yes  Problem Relation Age of Onset   Diabetes Father     Social History Social History   Tobacco Use   Smoking status: Former    Current packs/day: 0.00    Types: Cigarettes    Quit date: 08/22/1995    Years since quitting: 27.0   Smokeless tobacco: Never  Vaping Use   Vaping status: Former  Substance Use Topics   Alcohol use: Yes    Comment: socially. 2-3 beers on weekend.   Drug use: No     Allergies   Patient has no known allergies.   Review of Systems Review of Systems Pertinent negatives listed in HPI   Physical Exam Triage Vital Signs ED Triage Vitals  Encounter Vitals Group     BP 09/13/22 1231 (!) 164/95     Systolic BP Percentile --      Diastolic BP Percentile --      Pulse Rate 09/13/22 1231 80     Resp 09/13/22 1231 16     Temp 09/13/22 1231 98.4 F (36.9 C)     Temp Source 09/13/22 1231 Oral     SpO2 09/13/22 1231 97 %     Weight 09/13/22 1230 184 lb (83.5 kg)     Height 09/13/22 1230 5\' 8"  (1.727 m)     Head Circumference --      Peak Flow --      Pain Score 09/13/22 1230 6     Pain Loc --      Pain Education --      Exclude from Growth Chart --    No data found.  Updated Vital Signs BP (!) 164/95 (BP Location: Left Arm)   Pulse 80   Temp 98.4 F (36.9 C) (Oral)   Resp 16   Ht 5\' 8"  (1.727 m)   Wt 184 lb (83.5 kg)   LMP 03/25/2016   SpO2 97%   BMI 27.98 kg/m   Visual Acuity Right Eye Distance:   Left Eye Distance:   Bilateral Distance:    Right Eye Near:   Left Eye Near:    Bilateral Near:     Physical Exam Vitals reviewed.  Constitutional:      Appearance: She is ill-appearing.  HENT:     Head: Normocephalic and atraumatic.     Nose: Congestion present.  Eyes:     Extraocular Movements: Extraocular movements intact.     Conjunctiva/sclera: Conjunctivae normal.     Pupils: Pupils are equal, round, and reactive to light.  Cardiovascular:      Rate and Rhythm: Normal rate and regular rhythm.  Pulmonary:     Effort: Pulmonary effort is normal. No respiratory distress.     Breath sounds: Normal breath sounds. No wheezing, rhonchi or rales.  Chest:     Chest wall: No tenderness.  Lymphadenopathy:     Cervical: No cervical adenopathy.  Skin:    General: Skin  is warm and dry.  Neurological:     General: No focal deficit present.     Mental Status: She is alert.      UC Treatments / Results  Labs (all labs ordered are listed, but only abnormal results are displayed) Labs Reviewed  SARS CORONAVIRUS 2 (TAT 6-24 HRS)    EKG   Radiology No results found.  Procedures Procedures (including critical care time)  Medications Ordered in UC Medications - No data to display  Initial Impression / Assessment and Plan / UC Course  I have reviewed the triage vital signs and the nursing notes.  Pertinent labs & imaging results that were available during my care of the patient were reviewed by me and considered in my medical decision making (see chart for details).    COVID test is pending.  Continue with symptom management as symptoms are viral. ED precautions given if symptoms worsen or do not improve.  Patient is aware that our office will only  contact her if her COVID test is positive.  Patient verbalized understanding and agreement with plan. Final Clinical Impressions(s) / UC Diagnoses   Final diagnoses:  Encounter for laboratory testing for COVID-19 virus  Viral URI with cough     Discharge Instructions      Recommend symptom treatment with cough and cold medication. Hydrate well with fluids. Your COVID test results will be available within 24 hours our office will call you only if your results are positive.  All results are viewable via MyChart. If you feel at any point your symptoms worsen return for evaluation if your symptoms become severe and include any difficulty breathing or involve any form of chest pain  go to the emergency department.   ED Prescriptions   None    PDMP not reviewed this encounter.   Bing Neighbors, NP 09/13/22 [phone]

## 2022-09-14 LAB — SARS CORONAVIRUS 2 (TAT 6-24 HRS): SARS Coronavirus 2: POSITIVE — AB

## 2022-11-23 ENCOUNTER — Ambulatory Visit: Payer: 59 | Admitting: Internal Medicine

## 2022-12-15 ENCOUNTER — Other Ambulatory Visit: Payer: Self-pay | Admitting: Obstetrics and Gynecology

## 2022-12-15 DIAGNOSIS — R928 Other abnormal and inconclusive findings on diagnostic imaging of breast: Secondary | ICD-10-CM

## 2023-01-06 ENCOUNTER — Ambulatory Visit
Admission: RE | Admit: 2023-01-06 | Discharge: 2023-01-06 | Disposition: A | Discharge status: Home / Self Care | Payer: No Typology Code available for payment source | Source: Ambulatory Visit | Attending: Obstetrics and Gynecology | Admitting: Obstetrics and Gynecology

## 2023-01-06 ENCOUNTER — Other Ambulatory Visit: Payer: Self-pay | Admitting: Obstetrics and Gynecology

## 2023-01-06 DIAGNOSIS — R928 Other abnormal and inconclusive findings on diagnostic imaging of breast: Secondary | ICD-10-CM

## 2023-01-06 DIAGNOSIS — N6489 Other specified disorders of breast: Secondary | ICD-10-CM

## 2023-01-10 ENCOUNTER — Ambulatory Visit
Admission: RE | Admit: 2023-01-10 | Discharge: 2023-01-10 | Disposition: A | Discharge status: Home / Self Care | Payer: No Typology Code available for payment source | Source: Ambulatory Visit | Attending: Obstetrics and Gynecology | Admitting: Obstetrics and Gynecology

## 2023-01-10 DIAGNOSIS — N6489 Other specified disorders of breast: Secondary | ICD-10-CM

## 2023-01-10 HISTORY — PX: BREAST BIOPSY: SHX20

## 2023-01-11 LAB — SURGICAL PATHOLOGY

## 2023-02-02 ENCOUNTER — Ambulatory Visit: Payer: 59 | Admitting: Internal Medicine

## 2023-02-10 ENCOUNTER — Ambulatory Visit: Payer: BC Managed Care – PPO | Admitting: Internal Medicine

## 2023-02-10 ENCOUNTER — Ambulatory Visit: Payer: Self-pay | Admitting: Cardiology

## 2023-02-10 ENCOUNTER — Ambulatory Visit: Payer: No Typology Code available for payment source | Admitting: Cardiology

## 2023-02-24 ENCOUNTER — Encounter: Payer: Self-pay | Admitting: Cardiology

## 2023-02-24 ENCOUNTER — Ambulatory Visit: Payer: Managed Care, Other (non HMO) | Attending: Cardiology | Admitting: Cardiology

## 2023-02-24 VITALS — BP 122/78 | HR 70 | Ht 66.0 in | Wt 188.8 lb

## 2023-02-24 DIAGNOSIS — I2510 Atherosclerotic heart disease of native coronary artery without angina pectoris: Secondary | ICD-10-CM

## 2023-02-24 DIAGNOSIS — E1140 Type 2 diabetes mellitus with diabetic neuropathy, unspecified: Secondary | ICD-10-CM

## 2023-02-24 DIAGNOSIS — E782 Mixed hyperlipidemia: Secondary | ICD-10-CM

## 2023-02-24 DIAGNOSIS — I739 Peripheral vascular disease, unspecified: Secondary | ICD-10-CM

## 2023-02-24 DIAGNOSIS — Z955 Presence of coronary angioplasty implant and graft: Secondary | ICD-10-CM | POA: Diagnosis not present

## 2023-02-24 DIAGNOSIS — I10 Essential (primary) hypertension: Secondary | ICD-10-CM | POA: Diagnosis not present

## 2023-02-24 MED ORDER — EZETIMIBE 10 MG PO TABS: 10.0000 mg | ORAL_TABLET | Freq: Every day | ORAL | 3 refills | Status: DC

## 2023-02-24 NOTE — Patient Instructions (Signed)
 Medication Instructions:  Your physician has recommended you make the following change in your medication:   START: Ezetimibe  (Zetia ) 10 mg once daily   *If you need a refill on your cardiac medications before your next appointment, please call your pharmacy*  Lab Work: To Be Completed in 6 WEEKS: FASTING lipid panel, direct LDL and a CMP If you have labs (blood work) drawn today and your tests are completely normal, you will receive your results only by: MyChart Message (if you have MyChart) OR A paper copy in the mail If you have any lab test that is abnormal or we need to change your treatment, we will call you to review the results.  Testing/Procedures: Your physician has requested that you have an echocardiogram. Echocardiography is a painless test that uses sound waves to create images of your heart. It provides your doctor with information about the size and shape of your heart and how well your heart's chambers and valves are working. This procedure takes approximately one hour. There are no restrictions for this procedure. Please do NOT wear cologne, perfume, aftershave, or lotions (deodorant is allowed). Please arrive 15 minutes prior to your appointment time.  Please note: We ask at that you not bring children with you during ultrasound (echo/ vascular) testing. Due to room size and safety concerns, children are not allowed in the ultrasound rooms during exams. Our front office staff cannot provide observation of children in our lobby area while testing is being conducted. An adult accompanying a patient to their appointment will only be allowed in the ultrasound room at the discretion of the ultrasound technician under special circumstances. We apologize for any inconvenience.  Your physician has requested that you have an ankle brachial index (ABI). During this test an ultrasound and blood pressure cuff are used to evaluate the arteries that supply the arms and legs with blood.  Allow thirty minutes for this exam. There are no restrictions or special instructions.  Please note: We ask at that you not bring children with you during ultrasound (echo/ vascular) testing. Due to room size and safety concerns, children are not allowed in the ultrasound rooms during exams. Our front office staff cannot provide observation of children in our lobby area while testing is being conducted. An adult accompanying a patient to their appointment will only be allowed in the ultrasound room at the discretion of the ultrasound technician under special circumstances. We apologize for any inconvenience.  Your physician has requested that you have a lower extremity arterial duplex. This test is an ultrasound of the arteries in the legs or arms. It looks at arterial blood flow in the legs and arms. Allow one hour for Lower and Upper Arterial scans. There are no restrictions or special instructions.  Please note: We ask at that you not bring children with you during ultrasound (echo/ vascular) testing. Due to room size and safety concerns, children are not allowed in the ultrasound rooms during exams. Our front office staff cannot provide observation of children in our lobby area while testing is being conducted. An adult accompanying a patient to their appointment will only be allowed in the ultrasound room at the discretion of the ultrasound technician under special circumstances. We apologize for any inconvenience.    Follow-Up: At [facility], you and your health needs are our priority.  As part of our continuing mission to provide you with exceptional heart care, we have created designated Provider Care Teams.  These Care Teams include your primary  Cardiologist (physician) and Advanced Practice Providers (APPs -  Physician Assistants and Nurse Practitioners) who all work together to provide you with the care you need, when you need it.  We recommend signing up for the patient portal called  MyChart.  Sign up information is provided on this After Visit Summary.  MyChart is used to connect with patients for Virtual Visits (Telemedicine).  Patients are able to view lab/test results, encounter notes, upcoming appointments, etc.  Non-urgent messages can be sent to your provider as well.   To learn more about what you can do with MyChart, go to [email or web address].    Your next appointment:   6 month(s)  The format for your next appointment:   In Person  Provider:   Madonna Large, DO {

## 2023-02-24 NOTE — Progress Notes (Signed)
 Cardiology Office Note:  .   Date:  02/24/2023  ID:  [name], DOB [birth year], MRN [id number] PCP:  Jolee Madelin Patch, MD  Former Cardiology Providers: N/A [facility] Providers Cardiologist:  Madonna Large, DO , [facility] (established care 02/24/23) Electrophysiologist:  None  Click to update primary MD,subspecialty MD or APP then REFRESH:1}    Chief Complaint  Patient presents with   Hypertension   PAD   Coronary artery disease involving native coronary artery of   Follow-up    History of Present Illness: .   Vickie Little is a 58 y.o. African-American female whose past medical history and cardiovascular risk factors includes: Hypertension, former smoker, diabetes, hyperlipidemia, CAD with NSTEMI in 2013 underwent PCI to the LAD, documented history of PAD as well as neurogenic lumbar claudication.  Formally under the care of Dr. Annalee Custovic who last saw Vickie Little back in December 2023. I am seeing her for the first time to re-establishing care.   Over the last 1 year she denies any anginal chest pain or heart failure symptoms.  Patient also denies claudication.  Review of Systems: .   Review of Systems  Cardiovascular:  Negative for chest pain, claudication, irregular heartbeat, leg swelling, near-syncope, orthopnea, palpitations, paroxysmal nocturnal dyspnea and syncope.  Respiratory:  Negative for shortness of breath.   Hematologic/Lymphatic: Negative for bleeding problem.    Studies Reviewed:   EKG: EKG Interpretation Date/Time:  Friday February 24 2023 08:53:53 EST Ventricular Rate:  67 PR Interval:  148 QRS Duration:  70 QT Interval:  376 QTC Calculation: 397 R Axis:   10  Text Interpretation: Normal sinus rhythm Septal infarct (cited on or before 09-Feb-2012) When compared with ECG of 14-Feb-2012 06:52, Nonspecific T wave abnormality no longer evident in Anterolateral leads QT has shortened Confirmed by Large Madonna [phone]) on 02/24/2023  9:00:56 AM  Echocardiogram: 01/2012: LVEF 55 to 60%.  See report for additional details  Stress Testing: Lexiscan sestamibi stress test 04/11/2016: Low risk study.   See report for additional details   Coronary angiogram 02/13/2012:  Mid LAD Overlapping 3.0 x 16 mm and2.75 x 38 mm Promus DES.  Diagonal vessels with ostial 95% stenosis, small calibered.  Mild disease in circumflex and right coronary artery.  EF 55%.  ABI 12/26/2018: This exam reveals mildly decreased perfusion of the right lower extremity, noted at the dorsalis pedis artery level (ABI 0.81). This exam reveals moderately decreased perfusion of the left lower extremity, noted at the dorsalis pedis artery level (ABI 0.69).  Compared to lower extremity arterial duplex/ABI done on 01/10/2018, no significant change.  RADIOLOGY: NA  Risk Assessment/Calculations:   NA   Labs:   External Labs: Collected: 10/26/2022 available in Care Everywhere. Hemoglobin A1c 10.6. Sodium 138, potassium 4.3, chloride 100, bicarb 27. Creatinine 0.7. eGFR >90. AST and ALT within normal limits. Alkaline phosphatase elevated-343 Total cholesterol 187, triglycerides 131, HDL 49, LDL calculated 113, non-HDL 138  Physical Exam:    Today's Vitals   02/24/23 0847  BP: 122/78  Pulse: 70  Weight: 188 lb 12.8 oz (85.6 kg)  Height: 5' 6 (1.676 m)   Body mass index is 30.47 kg/m. Wt Readings from Last 3 Encounters:  02/24/23 188 lb 12.8 oz (85.6 kg)  09/13/22 184 lb (83.5 kg)  07/29/22 192 lb (87.1 kg)    Physical Exam  Constitutional: No distress.  hemodynamically stable  Neck: No JVD present.  Cardiovascular: Normal rate, regular rhythm, S1 normal and S2  normal. Exam reveals no gallop, no S3 and no S4.  No murmur heard. Pulses:      Dorsalis pedis pulses are 1+ on the right side and 1+ on the left side.       Posterior tibial pulses are 1+ on the right side and 1+ on the left side.  Pulmonary/Chest: Effort normal and breath  sounds normal. No stridor. She has no wheezes. She has no rales.  Abdominal: Soft. Bowel sounds are normal. She exhibits no distension. There is no abdominal tenderness.  Musculoskeletal:        General: No edema.     Cervical back: Neck supple.  Neurological: She is alert and oriented to person, place, and time. She has intact cranial nerves (2-12).  Skin: Skin is warm.   Impression & Recommendation(s):  Impression:   ICD-10-CM   1. Coronary artery disease involving native coronary artery of native heart without angina pectoris  I25.10     2. History of coronary artery stent placement  Z95.5     3. Benign hypertension  I10 EKG 12-Lead    4. Type 2 diabetes mellitus with diabetic neuropathy, without long-term current use of insulin  (HCC)  E11.40     5. Mixed hyperlipidemia  E78.2     6. PAD (peripheral artery disease) (HCC)  I73.9        Recommendation(s):  Coronary artery disease involving native coronary artery of native heart without angina pectoris History of coronary artery stent placement Denies anginal chest pain or heart failure symptoms. Continue aspirin  81 mg p.o. daily. Continue atorvastatin  20 mg p.o. daily. Add Zetia  10 mg p.o. daily. Goal LDL <55 mg/dL in the setting of premature CAD, PAD, diabetes Echo will be ordered to evaluate for structural heart disease and left ventricular systolic function.  Benign hypertension Office blood pressures are very well-controlled. Currently on Farxiga 10 mg p.o. daily. Consider addition of ACE inhibitor's or ARB for renal protection in the setting of diabetes-defer to PCP/endocrinology  Type 2 diabetes mellitus with diabetic neuropathy, without long-term current use of insulin  (HCC) Hemoglobin A1c >10 as of September 2024-records available in Care Everywhere. Re emphasize importance of glycemic control. Currently on Farxiga and metformin and glimepiride  Mixed hyperlipidemia Currently on atorvastatin  20 mg p.o.  daily. Will add Zetia  10 mg p.o. daily She denies myalgia or other side effects. Most recent lipids dated September 2024, independently reviewed as noted above.  PAD (peripheral artery disease) (HCC) Clinically denies claudication. No longer on cilostazol . Check lower extremity arterial duplex as well as ABI Continue antiplatelet therapy and statin therapy Reemphasized the importance of secondary prevention with focus on improving her modifiable cardiovascular risk factors such as glycemic control, lipid management, blood pressure control, weight loss.  Ideally would like to have her LDL levels less than 55 mg/dL.  We discussed up titration of statin therapy.  However she is noted to have elevated alkaline phosphatase which should be looked into further prior to further up titration of statin therapy.  I have asked her to follow-up with PCP or gastroenterology to see if any additional workup is needed.  Would like to get a clearance from either primary or GI to uptitrate statin therapy in the setting of elevated alkaline phosphatase.  Orders Placed:  Orders Placed This Encounter  Procedures   EKG 12-Lead   Discussed management of least 2 chronic comorbid conditions, prior echocardiogram, heart catheterization report reviewed, outside labs from Care Everywhere from September 2024 independently reviewed, EKG ordered and  independently reviewed, medication changes as noted above and additional diagnostic workup is recommended as stated above.  Final Medication List:   No orders of the defined types were placed in this encounter.   There are no discontinued medications.   Current Outpatient Medications:    aspirin  EC 81 MG tablet, Take 81 mg by mouth daily., Disp: , Rfl:    atorvastatin  (LIPITOR ) 20 MG tablet, TAKE 1 TABLET(20 MG) BY MOUTH DAILY, Disp: 90 tablet, Rfl: 1   cholecalciferol (VITAMIN D3) 25 MCG (1000 UNIT) tablet, Vitamin D , Disp: , Rfl:    dapagliflozin propanediol (FARXIGA) 10  MG TABS tablet, Take 10 mg by mouth daily., Disp: , Rfl:    glimepiride (AMARYL) 2 MG tablet, Take 2 mg by mouth daily with breakfast., Disp: , Rfl:    metFORMIN (GLUCOPHAGE) 500 MG tablet, Take 1,000 mg by mouth 2 (two) times daily with a meal. , Disp: , Rfl:    Multiple Vitamin (MULTIVITAMIN WITH MINERALS) TABS tablet, Take 1 tablet by mouth daily. Hasn't had in a week, Disp: , Rfl:    Semaglutide,0.25 or 0.5MG /DOS, (OZEMPIC, 0.25 OR 0.5 MG/DOSE,) 2 MG/1.5ML SOPN, Inject 0.5 mg into the skin once a week., Disp: , Rfl:    cetirizine  (ZYRTEC ) 10 MG tablet, Take 1 tablet (10 mg total) by mouth daily. (Patient not taking: Reported on 02/24/2023), Disp: 30 tablet, Rfl: 0   cilostazol  (PLETAL ) 50 MG tablet, TAKE 1 TABLET(50 MG) BY MOUTH TWICE DAILY (Patient not taking: Reported on 02/24/2023), Disp: 180 tablet, Rfl: 3   ezetimibe  (ZETIA ) 10 MG tablet, Take 1 tablet (10 mg total) by mouth daily., Disp: 30 tablet, Rfl: 2   fluticasone  (FLONASE ) 50 MCG/ACT nasal spray, Place 1 spray into both nostrils daily. (Patient not taking: Reported on 02/24/2023), Disp: 16 g, Rfl: 0   metoprolol  succinate (TOPROL -XL) 25 MG 24 hr tablet, TAKE 1 TABLET(25 MG) BY MOUTH DAILY (Patient not taking: Reported on 02/24/2023), Disp: 30 tablet, Rfl: 3   Phenylephrine -DM-GG (TUSSIN CF PO), Take by mouth. (Patient not taking: Reported on 02/24/2023), Disp: , Rfl:    TRULICITY 4.5 MG/0.5ML SOPN, Inject into the skin. (Patient not taking: Reported on 02/24/2023), Disp: , Rfl:   Consent:   N/A  Disposition:   6 months sooner if needed Patient may be asked to follow-up sooner based on the results of the above-mentioned testing.  Her questions and concerns were addressed to her satisfaction. She voices understanding of the recommendations provided during this encounter.    Signed, Madonna Large, DO, [facility]  [facility]  [address] [place], [place] [address] 02/24/2023 9:33 AM

## 2023-03-05 ENCOUNTER — Encounter: Payer: Self-pay | Admitting: Cardiology

## 2023-03-27 ENCOUNTER — Encounter (HOSPITAL_COMMUNITY): Payer: Managed Care, Other (non HMO)

## 2023-03-27 ENCOUNTER — Ambulatory Visit (HOSPITAL_COMMUNITY): Admission: RE | Admit: 2023-03-27 | Payer: Managed Care, Other (non HMO) | Source: Ambulatory Visit

## 2023-04-02 ENCOUNTER — Encounter (HOSPITAL_COMMUNITY): Payer: Self-pay

## 2023-04-02 ENCOUNTER — Emergency Department (HOSPITAL_COMMUNITY): Payer: Managed Care, Other (non HMO)

## 2023-04-02 ENCOUNTER — Inpatient Hospital Stay (HOSPITAL_COMMUNITY): Payer: Managed Care, Other (non HMO)

## 2023-04-02 ENCOUNTER — Inpatient Hospital Stay (HOSPITAL_COMMUNITY)
Admission: EM | Admit: 2023-04-02 | Discharge: 2023-04-18 | DRG: 004 | Disposition: A | Discharge status: Other Acute Inpatient Hospital | Payer: Managed Care, Other (non HMO) | Attending: Critical Care Medicine | Admitting: Critical Care Medicine

## 2023-04-02 ENCOUNTER — Other Ambulatory Visit: Payer: Self-pay

## 2023-04-02 DIAGNOSIS — I5043 Acute on chronic combined systolic (congestive) and diastolic (congestive) heart failure: Secondary | ICD-10-CM | POA: Diagnosis present

## 2023-04-02 DIAGNOSIS — E1165 Type 2 diabetes mellitus with hyperglycemia: Secondary | ICD-10-CM | POA: Diagnosis not present

## 2023-04-02 DIAGNOSIS — I214 Non-ST elevation (NSTEMI) myocardial infarction: Secondary | ICD-10-CM | POA: Diagnosis present

## 2023-04-02 DIAGNOSIS — Z794 Long term (current) use of insulin: Secondary | ICD-10-CM

## 2023-04-02 DIAGNOSIS — E11319 Type 2 diabetes mellitus with unspecified diabetic retinopathy without macular edema: Secondary | ICD-10-CM | POA: Diagnosis present

## 2023-04-02 DIAGNOSIS — U071 COVID-19: Secondary | ICD-10-CM

## 2023-04-02 DIAGNOSIS — J9600 Acute respiratory failure, unspecified whether with hypoxia or hypercapnia: Secondary | ICD-10-CM

## 2023-04-02 DIAGNOSIS — G931 Anoxic brain damage, not elsewhere classified: Secondary | ICD-10-CM

## 2023-04-02 DIAGNOSIS — I462 Cardiac arrest due to underlying cardiac condition: Secondary | ICD-10-CM | POA: Diagnosis present

## 2023-04-02 DIAGNOSIS — E785 Hyperlipidemia, unspecified: Secondary | ICD-10-CM | POA: Diagnosis present

## 2023-04-02 DIAGNOSIS — I110 Hypertensive heart disease with heart failure: Secondary | ICD-10-CM | POA: Diagnosis present

## 2023-04-02 DIAGNOSIS — Y832 Surgical operation with anastomosis, bypass or graft as the cause of abnormal reaction of the patient, or of later complication, without mention of misadventure at the time of the procedure: Secondary | ICD-10-CM | POA: Diagnosis not present

## 2023-04-02 DIAGNOSIS — R570 Cardiogenic shock: Secondary | ICD-10-CM | POA: Diagnosis not present

## 2023-04-02 DIAGNOSIS — E1110 Type 2 diabetes mellitus with ketoacidosis without coma: Secondary | ICD-10-CM | POA: Diagnosis present

## 2023-04-02 DIAGNOSIS — I10 Essential (primary) hypertension: Secondary | ICD-10-CM | POA: Diagnosis not present

## 2023-04-02 DIAGNOSIS — Z8679 Personal history of other diseases of the circulatory system: Secondary | ICD-10-CM | POA: Diagnosis not present

## 2023-04-02 DIAGNOSIS — K7200 Acute and subacute hepatic failure without coma: Secondary | ICD-10-CM | POA: Diagnosis present

## 2023-04-02 DIAGNOSIS — I4901 Ventricular fibrillation: Principal | ICD-10-CM | POA: Diagnosis present

## 2023-04-02 DIAGNOSIS — I5041 Acute combined systolic (congestive) and diastolic (congestive) heart failure: Secondary | ICD-10-CM | POA: Diagnosis not present

## 2023-04-02 DIAGNOSIS — R739 Hyperglycemia, unspecified: Secondary | ICD-10-CM | POA: Diagnosis not present

## 2023-04-02 DIAGNOSIS — G9341 Metabolic encephalopathy: Secondary | ICD-10-CM | POA: Diagnosis present

## 2023-04-02 DIAGNOSIS — T17890A Other foreign object in other parts of respiratory tract causing asphyxiation, initial encounter: Secondary | ICD-10-CM | POA: Diagnosis not present

## 2023-04-02 DIAGNOSIS — R4182 Altered mental status, unspecified: Secondary | ICD-10-CM

## 2023-04-02 DIAGNOSIS — I2510 Atherosclerotic heart disease of native coronary artery without angina pectoris: Secondary | ICD-10-CM | POA: Diagnosis present

## 2023-04-02 DIAGNOSIS — E669 Obesity, unspecified: Secondary | ICD-10-CM | POA: Diagnosis present

## 2023-04-02 DIAGNOSIS — Z6832 Body mass index (BMI) 32.0-32.9, adult: Secondary | ICD-10-CM | POA: Diagnosis not present

## 2023-04-02 DIAGNOSIS — J80 Acute respiratory distress syndrome: Secondary | ICD-10-CM | POA: Diagnosis present

## 2023-04-02 DIAGNOSIS — N170 Acute kidney failure with tubular necrosis: Secondary | ICD-10-CM | POA: Diagnosis not present

## 2023-04-02 DIAGNOSIS — J690 Pneumonitis due to inhalation of food and vomit: Secondary | ICD-10-CM | POA: Diagnosis present

## 2023-04-02 DIAGNOSIS — R7402 Elevation of levels of lactic acid dehydrogenase (LDH): Secondary | ICD-10-CM | POA: Diagnosis not present

## 2023-04-02 DIAGNOSIS — E119 Type 2 diabetes mellitus without complications: Secondary | ICD-10-CM | POA: Diagnosis not present

## 2023-04-02 DIAGNOSIS — I469 Cardiac arrest, cause unspecified: Secondary | ICD-10-CM | POA: Diagnosis not present

## 2023-04-02 DIAGNOSIS — Z930 Tracheostomy status: Secondary | ICD-10-CM

## 2023-04-02 DIAGNOSIS — Z9911 Dependence on respirator [ventilator] status: Secondary | ICD-10-CM

## 2023-04-02 DIAGNOSIS — Z87891 Personal history of nicotine dependence: Secondary | ICD-10-CM

## 2023-04-02 DIAGNOSIS — Z7984 Long term (current) use of oral hypoglycemic drugs: Secondary | ICD-10-CM

## 2023-04-02 DIAGNOSIS — D6489 Other specified anemias: Secondary | ICD-10-CM | POA: Diagnosis not present

## 2023-04-02 DIAGNOSIS — Z7982 Long term (current) use of aspirin: Secondary | ICD-10-CM

## 2023-04-02 DIAGNOSIS — I252 Old myocardial infarction: Secondary | ICD-10-CM

## 2023-04-02 DIAGNOSIS — E1151 Type 2 diabetes mellitus with diabetic peripheral angiopathy without gangrene: Secondary | ICD-10-CM | POA: Diagnosis present

## 2023-04-02 DIAGNOSIS — J9501 Hemorrhage from tracheostomy stoma: Secondary | ICD-10-CM | POA: Diagnosis not present

## 2023-04-02 DIAGNOSIS — W44F9XA Other object of natural or organic material, entering into or through a natural orifice, initial encounter: Secondary | ICD-10-CM | POA: Diagnosis not present

## 2023-04-02 DIAGNOSIS — Z833 Family history of diabetes mellitus: Secondary | ICD-10-CM

## 2023-04-02 DIAGNOSIS — I493 Ventricular premature depolarization: Secondary | ICD-10-CM | POA: Diagnosis present

## 2023-04-02 DIAGNOSIS — E876 Hypokalemia: Secondary | ICD-10-CM | POA: Diagnosis present

## 2023-04-02 DIAGNOSIS — Z7985 Long-term (current) use of injectable non-insulin antidiabetic drugs: Secondary | ICD-10-CM

## 2023-04-02 DIAGNOSIS — E870 Hyperosmolality and hypernatremia: Secondary | ICD-10-CM | POA: Diagnosis not present

## 2023-04-02 DIAGNOSIS — E871 Hypo-osmolality and hyponatremia: Secondary | ICD-10-CM | POA: Diagnosis present

## 2023-04-02 DIAGNOSIS — J9601 Acute respiratory failure with hypoxia: Secondary | ICD-10-CM | POA: Diagnosis not present

## 2023-04-02 DIAGNOSIS — Z955 Presence of coronary angioplasty implant and graft: Secondary | ICD-10-CM

## 2023-04-02 DIAGNOSIS — N179 Acute kidney failure, unspecified: Secondary | ICD-10-CM | POA: Diagnosis not present

## 2023-04-02 DIAGNOSIS — Z79899 Other long term (current) drug therapy: Secondary | ICD-10-CM

## 2023-04-02 DIAGNOSIS — J1282 Pneumonia due to coronavirus disease 2019: Secondary | ICD-10-CM | POA: Diagnosis present

## 2023-04-02 DIAGNOSIS — R509 Fever, unspecified: Secondary | ICD-10-CM | POA: Diagnosis not present

## 2023-04-02 DIAGNOSIS — G9389 Other specified disorders of brain: Secondary | ICD-10-CM | POA: Diagnosis present

## 2023-04-02 LAB — CBC
HCT: 42.2 % (ref 36.0–46.0)
Hemoglobin: 13.2 g/dL (ref 12.0–15.0)
MCH: 26.5 pg (ref 26.0–34.0)
MCHC: 31.3 g/dL (ref 30.0–36.0)
MCV: 84.6 fL (ref 80.0–100.0)
Platelets: 218 10*3/uL (ref 150–400)
RBC: 4.99 MIL/uL (ref 3.87–5.11)
RDW: 13.9 % (ref 11.5–15.5)
WBC: 11.7 10*3/uL — ABNORMAL HIGH (ref 4.0–10.5)
nRBC: 0 % (ref 0.0–0.2)

## 2023-04-02 LAB — I-STAT CHEM 8, ED
BUN: 11 mg/dL (ref 6–20)
Calcium, Ion: 1.09 mmol/L — ABNORMAL LOW (ref 1.15–1.40)
Chloride: 105 mmol/L (ref 98–111)
Creatinine, Ser: 0.8 mg/dL (ref 0.44–1.00)
Glucose, Bld: 408 mg/dL — ABNORMAL HIGH (ref 70–99)
HCT: 43 % (ref 36.0–46.0)
Hemoglobin: 14.6 g/dL (ref 12.0–15.0)
Potassium: 3.9 mmol/L (ref 3.5–5.1)
Sodium: 136 mmol/L (ref 135–145)
TCO2: 17 mmol/L — ABNORMAL LOW (ref 22–32)

## 2023-04-02 LAB — BASIC METABOLIC PANEL
Anion gap: 17 — ABNORMAL HIGH (ref 5–15)
BUN: 12 mg/dL (ref 6–20)
CO2: 16 mmol/L — ABNORMAL LOW (ref 22–32)
Calcium: 8.9 mg/dL (ref 8.9–10.3)
Chloride: 106 mmol/L (ref 98–111)
Creatinine, Ser: 1.16 mg/dL — ABNORMAL HIGH (ref 0.44–1.00)
GFR, Estimated: 56 mL/min — ABNORMAL LOW (ref >60)
Glucose, Bld: 276 mg/dL — ABNORMAL HIGH (ref 70–99)
Potassium: 3.6 mmol/L (ref 3.5–5.1)
Sodium: 139 mmol/L (ref 135–145)

## 2023-04-02 LAB — COMPREHENSIVE METABOLIC PANEL
ALT: 302 U/L — ABNORMAL HIGH (ref 0–44)
AST: 288 U/L — ABNORMAL HIGH (ref 15–41)
Albumin: 3.6 g/dL (ref 3.5–5.0)
Alkaline Phosphatase: 263 U/L — ABNORMAL HIGH (ref 38–126)
Anion gap: 20 — ABNORMAL HIGH (ref 5–15)
BUN: 11 mg/dL (ref 6–20)
CO2: 13 mmol/L — ABNORMAL LOW (ref 22–32)
Calcium: 8.9 mg/dL (ref 8.9–10.3)
Chloride: 103 mmol/L (ref 98–111)
Creatinine, Ser: 1.14 mg/dL — ABNORMAL HIGH (ref 0.44–1.00)
GFR, Estimated: 57 mL/min — ABNORMAL LOW (ref >60)
Glucose, Bld: 399 mg/dL — ABNORMAL HIGH (ref 70–99)
Potassium: 3.9 mmol/L (ref 3.5–5.1)
Sodium: 136 mmol/L (ref 135–145)
Total Bilirubin: 1.1 mg/dL (ref 0.0–1.2)
Total Protein: 6.5 g/dL (ref 6.5–8.1)

## 2023-04-02 LAB — I-STAT ARTERIAL BLOOD GAS, ED
Acid-base deficit: 8 mmol/L — ABNORMAL HIGH (ref 0.0–2.0)
Bicarbonate: 18.4 mmol/L — ABNORMAL LOW (ref 20.0–28.0)
Calcium, Ion: 1.25 mmol/L (ref 1.15–1.40)
HCT: 41 % (ref 36.0–46.0)
Hemoglobin: 13.9 g/dL (ref 12.0–15.0)
O2 Saturation: 100 %
Patient temperature: 96.1
Potassium: 3.8 mmol/L (ref 3.5–5.1)
Sodium: 137 mmol/L (ref 135–145)
TCO2: 20 mmol/L — ABNORMAL LOW (ref 22–32)
pCO2 arterial: 37 mm[Hg] (ref 32–48)
pH, Arterial: 7.297 — ABNORMAL LOW (ref 7.35–7.45)
pO2, Arterial: 275 mm[Hg] — ABNORMAL HIGH (ref 83–108)

## 2023-04-02 LAB — POCT I-STAT 7, (LYTES, BLD GAS, ICA,H+H)
Acid-base deficit: 5 mmol/L — ABNORMAL HIGH (ref 0.0–2.0)
Bicarbonate: 16.2 mmol/L — ABNORMAL LOW (ref 20.0–28.0)
Calcium, Ion: 1.22 mmol/L (ref 1.15–1.40)
HCT: 39 % (ref 36.0–46.0)
Hemoglobin: 13.3 g/dL (ref 12.0–15.0)
O2 Saturation: 100 %
Patient temperature: 37.3
Potassium: 3.5 mmol/L (ref 3.5–5.1)
Sodium: 139 mmol/L (ref 135–145)
TCO2: 17 mmol/L — ABNORMAL LOW (ref 22–32)
pCO2 arterial: 22.2 mm[Hg] — ABNORMAL LOW (ref 32–48)
pH, Arterial: 7.472 — ABNORMAL HIGH (ref 7.35–7.45)
pO2, Arterial: 199 mm[Hg] — ABNORMAL HIGH (ref 83–108)

## 2023-04-02 LAB — HEMOGLOBIN A1C
Hgb A1c MFr Bld: 9.2 % — ABNORMAL HIGH (ref 4.8–5.6)
Mean Plasma Glucose: 217.34 mg/dL

## 2023-04-02 LAB — GLUCOSE, CAPILLARY
Glucose-Capillary: 293 mg/dL — ABNORMAL HIGH (ref 70–99)
Glucose-Capillary: 325 mg/dL — ABNORMAL HIGH (ref 70–99)
Glucose-Capillary: 250 mg/dL — ABNORMAL HIGH (ref 70–99)
Glucose-Capillary: 258 mg/dL — ABNORMAL HIGH (ref 70–99)
Glucose-Capillary: 157 mg/dL — ABNORMAL HIGH (ref 70–99)

## 2023-04-02 LAB — I-STAT CG4 LACTIC ACID, ED: Lactic Acid, Venous: 10 mmol/L (ref 0.5–1.9)

## 2023-04-02 LAB — CG4 I-STAT (LACTIC ACID): Lactic Acid, Venous: 3 mmol/L (ref 0.5–1.9)

## 2023-04-02 LAB — LACTIC ACID, PLASMA
Lactic Acid, Venous: 7 mmol/L (ref 0.5–1.9)
Lactic Acid, Venous: 5.1 mmol/L (ref 0.5–1.9)

## 2023-04-02 LAB — PROCALCITONIN: Procalcitonin: 0.19 ng/mL

## 2023-04-02 LAB — BETA-HYDROXYBUTYRIC ACID
Beta-Hydroxybutyric Acid: 0.68 mmol/L — ABNORMAL HIGH (ref 0.05–0.27)
Beta-Hydroxybutyric Acid: 3.09 mmol/L — ABNORMAL HIGH (ref 0.05–0.27)

## 2023-04-02 LAB — TROPONIN I (HIGH SENSITIVITY)
Troponin I (High Sensitivity): 223 ng/L (ref <18)
Troponin I (High Sensitivity): 7944 ng/L (ref <18)

## 2023-04-02 LAB — HIV ANTIBODY (ROUTINE TESTING W REFLEX): HIV Screen 4th Generation wRfx: NONREACTIVE

## 2023-04-02 LAB — MRSA NEXT GEN BY PCR, NASAL: MRSA by PCR Next Gen: NOT DETECTED

## 2023-04-02 LAB — MAGNESIUM: Magnesium: 1.9 mg/dL (ref 1.7–2.4)

## 2023-04-02 LAB — SARS CORONAVIRUS 2 BY RT PCR: SARS Coronavirus 2 by RT PCR: POSITIVE — AB

## 2023-04-02 MED ORDER — ACETAMINOPHEN 160 MG/5ML PO SOLN
650.0000 mg | ORAL | Status: AC
  Administered 2023-04-02 – 2023-04-04 (×8): 650 mg
  Filled 2023-04-02 (×8): qty 20.3

## 2023-04-02 MED ORDER — KETAMINE HCL 50 MG/ML IJ SOLN: 100.0000 mg | Freq: Once | INTRAMUSCULAR | Status: DC

## 2023-04-02 MED ORDER — DOCUSATE SODIUM 50 MG/5ML PO LIQD
100.0000 mg | Freq: Two times a day (BID) | ORAL | Status: DC
  Administered 2023-04-02 – 2023-04-05 (×6): 100 mg
  Filled 2023-04-02 (×6): qty 10

## 2023-04-02 MED ORDER — DEXTROSE 50 % IV SOLN: 0.0000 mL | INTRAVENOUS | Status: DC | PRN

## 2023-04-02 MED ORDER — ONDANSETRON HCL 4 MG/2ML IJ SOLN: 4.0000 mg | Freq: Four times a day (QID) | INTRAMUSCULAR | Status: DC | PRN

## 2023-04-02 MED ORDER — MAGNESIUM SULFATE 2 GM/50ML IV SOLN
2.0000 g | Freq: Once | INTRAVENOUS | Status: DC | PRN
  Administered 2023-04-02: 2 g via INTRAVENOUS
  Filled 2023-04-02: qty 50

## 2023-04-02 MED ORDER — FENTANYL BOLUS VIA INFUSION
50.0000 ug | INTRAVENOUS | Status: DC | PRN
Start: 2023-04-02 — End: 2023-04-05
  Administered 2023-04-03 (×2): 50 ug via INTRAVENOUS

## 2023-04-02 MED ORDER — ASPIRIN 81 MG PO CHEW
81.0000 mg | CHEWABLE_TABLET | Freq: Every day | ORAL | Status: DC
  Administered 2023-04-02 – 2023-04-18 (×15): 81 mg
  Filled 2023-04-02 (×15): qty 1

## 2023-04-02 MED ORDER — NOREPINEPHRINE 4 MG/250ML-% IV SOLN
2.0000 ug/min | INTRAVENOUS | Status: DC
  Administered 2023-04-02 (×2): 2 ug/min via INTRAVENOUS
  Administered 2023-04-03: 3 ug/min via INTRAVENOUS
  Administered 2023-04-04: 2 ug/min via INTRAVENOUS
  Filled 2023-04-02 (×3): qty 250

## 2023-04-02 MED ORDER — MIDAZOLAM HCL 2 MG/2ML IJ SOLN: 2.0000 mg | Freq: Once | INTRAMUSCULAR | Status: AC

## 2023-04-02 MED ORDER — SUCCINYLCHOLINE CHLORIDE 200 MG/10ML IV SOSY
PREFILLED_SYRINGE | INTRAVENOUS | Status: AC
  Filled 2023-04-02: qty 10

## 2023-04-02 MED ORDER — SODIUM CHLORIDE 0.9 % IV SOLN: 250.0000 mL | INTRAVENOUS | Status: AC

## 2023-04-02 MED ORDER — AMIODARONE HCL IN DEXTROSE 360-4.14 MG/200ML-% IV SOLN
30.0000 mg/h | INTRAVENOUS | Status: DC
  Administered 2023-04-03 – 2023-04-10 (×16): 30 mg/h via INTRAVENOUS
  Filled 2023-04-02 (×15): qty 200

## 2023-04-02 MED ORDER — FAMOTIDINE 20 MG PO TABS
20.0000 mg | ORAL_TABLET | Freq: Two times a day (BID) | ORAL | Status: DC
  Administered 2023-04-02 – 2023-04-10 (×18): 20 mg
  Filled 2023-04-02 (×18): qty 1

## 2023-04-02 MED ORDER — PROPOFOL 1000 MG/100ML IV EMUL: 0.0000 ug/kg/min | INTRAVENOUS | Status: DC

## 2023-04-02 MED ORDER — ATORVASTATIN CALCIUM 10 MG PO TABS
20.0000 mg | ORAL_TABLET | Freq: Every day | ORAL | Status: DC
  Administered 2023-04-03: 20 mg
  Filled 2023-04-02: qty 2

## 2023-04-02 MED ORDER — ROCURONIUM BROMIDE 10 MG/ML (PF) SYRINGE
PREFILLED_SYRINGE | INTRAVENOUS | Status: AC
  Administered 2023-04-02: 80 mg via INTRAVENOUS
  Filled 2023-04-02: qty 10

## 2023-04-02 MED ORDER — ORAL CARE MOUTH RINSE: 15.0000 mL | OROMUCOSAL | Status: DC | PRN

## 2023-04-02 MED ORDER — KETAMINE HCL 50 MG/ML IJ SOLN: 50.0000 mg | INTRAMUSCULAR | Status: AC

## 2023-04-02 MED ORDER — FENTANYL 2500MCG IN NS 250ML (10MCG/ML) PREMIX INFUSION
50.0000 ug/h | INTRAVENOUS | Status: DC
  Administered 2023-04-02 – 2023-04-03 (×2): 50 ug/h via INTRAVENOUS
  Filled 2023-04-02 (×3): qty 250

## 2023-04-02 MED ORDER — MIDAZOLAM HCL 2 MG/2ML IJ SOLN
INTRAMUSCULAR | Status: AC
  Administered 2023-04-02: 2 mg via INTRAVENOUS
  Filled 2023-04-02: qty 2

## 2023-04-02 MED ORDER — SODIUM CHLORIDE 0.9 % IV SOLN: INTRAVENOUS | Status: AC | PRN

## 2023-04-02 MED ORDER — POTASSIUM CHLORIDE 10 MEQ/100ML IV SOLN
10.0000 meq | INTRAVENOUS | Status: AC
  Administered 2023-04-02 – 2023-04-03 (×6): 10 meq via INTRAVENOUS
  Filled 2023-04-02 (×6): qty 100

## 2023-04-02 MED ORDER — DEXTROSE IN LACTATED RINGERS 5 % IV SOLN: INTRAVENOUS | Status: DC

## 2023-04-02 MED ORDER — FENTANYL CITRATE PF 50 MCG/ML IJ SOSY: 50.0000 ug | PREFILLED_SYRINGE | Freq: Once | INTRAMUSCULAR | Status: DC

## 2023-04-02 MED ORDER — POLYETHYLENE GLYCOL 3350 17 G PO PACK
17.0000 g | PACK | Freq: Every day | ORAL | Status: DC
  Administered 2023-04-03 – 2023-04-05 (×3): 17 g
  Filled 2023-04-02 (×3): qty 1

## 2023-04-02 MED ORDER — ACETAMINOPHEN 650 MG RE SUPP: 650.0000 mg | RECTAL | Status: AC

## 2023-04-02 MED ORDER — ACETAMINOPHEN 325 MG PO TABS
650.0000 mg | ORAL_TABLET | ORAL | Status: DC | PRN
Start: 2023-04-04 — End: 2023-04-18
  Filled 2023-04-02 (×2): qty 2

## 2023-04-02 MED ORDER — ACETAMINOPHEN 325 MG PO TABS: 650.0000 mg | ORAL_TABLET | ORAL | Status: AC

## 2023-04-02 MED ORDER — ORAL CARE MOUTH RINSE
15.0000 mL | OROMUCOSAL | Status: DC
  Administered 2023-04-02 – 2023-04-18 (×187): 15 mL via OROMUCOSAL

## 2023-04-02 MED ORDER — HEPARIN (PORCINE) 25000 UT/250ML-% IV SOLN
800.0000 [IU]/h | INTRAVENOUS | Status: DC
  Administered 2023-04-02: 1050 [IU]/h via INTRAVENOUS
  Administered 2023-04-03 – 2023-04-05 (×2): 800 [IU]/h via INTRAVENOUS
  Filled 2023-04-02 (×3): qty 250

## 2023-04-02 MED ORDER — BUSPIRONE HCL 10 MG PO TABS
30.0000 mg | ORAL_TABLET | Freq: Three times a day (TID) | ORAL | Status: AC | PRN
  Administered 2023-04-03: 30 mg
  Filled 2023-04-02: qty 3

## 2023-04-02 MED ORDER — ACETAMINOPHEN 160 MG/5ML PO SOLN
650.0000 mg | ORAL | Status: DC | PRN
  Administered 2023-04-04 – 2023-04-13 (×8): 650 mg
  Filled 2023-04-02 (×6): qty 20.3

## 2023-04-02 MED ORDER — HEPARIN BOLUS VIA INFUSION
4000.0000 [IU] | Freq: Once | INTRAVENOUS | Status: AC
  Administered 2023-04-02: 4000 [IU] via INTRAVENOUS
  Filled 2023-04-02: qty 4000

## 2023-04-02 MED ORDER — SODIUM CHLORIDE 0.9 % IV SOLN
3.0000 g | Freq: Four times a day (QID) | INTRAVENOUS | Status: DC
  Administered 2023-04-02 – 2023-04-06 (×15): 3 g via INTRAVENOUS
  Filled 2023-04-02 (×15): qty 8

## 2023-04-02 MED ORDER — ACETAMINOPHEN 650 MG RE SUPP
650.0000 mg | RECTAL | Status: DC | PRN
  Filled 2023-04-02: qty 1

## 2023-04-02 MED ORDER — INSULIN REGULAR(HUMAN) IN NACL 100-0.9 UT/100ML-% IV SOLN
INTRAVENOUS | Status: DC
  Administered 2023-04-02: 10 [IU]/h via INTRAVENOUS
  Administered 2023-04-03: 2 [IU]/h via INTRAVENOUS
  Filled 2023-04-02 (×2): qty 100

## 2023-04-02 MED ORDER — LACTATED RINGERS IV SOLN: INTRAVENOUS | Status: DC

## 2023-04-02 MED ORDER — LACTATED RINGERS IV BOLUS
1000.0000 mL | Freq: Once | INTRAVENOUS | Status: AC
  Administered 2023-04-02: 1000 mL via INTRAVENOUS

## 2023-04-02 MED ORDER — KETAMINE HCL 50 MG/5ML IJ SOSY
50.0000 mg | PREFILLED_SYRINGE | INTRAMUSCULAR | Status: AC
  Administered 2023-04-02: 50 mg via INTRAVENOUS

## 2023-04-02 MED ORDER — ROCURONIUM BROMIDE 10 MG/ML (PF) SYRINGE: 80.0000 mg | PREFILLED_SYRINGE | Freq: Once | INTRAVENOUS | Status: AC

## 2023-04-02 MED ORDER — PROPOFOL 1000 MG/100ML IV EMUL
5.0000 ug/kg/min | INTRAVENOUS | Status: DC
  Administered 2023-04-02 (×2): 40 ug/kg/min via INTRAVENOUS
  Administered 2023-04-02 – 2023-04-03 (×2): 5 ug/kg/min via INTRAVENOUS
  Administered 2023-04-03 (×2): 40 ug/kg/min via INTRAVENOUS
  Administered 2023-04-04: 5 ug/kg/min via INTRAVENOUS
  Administered 2023-04-04 (×2): 35 ug/kg/min via INTRAVENOUS
  Filled 2023-04-02 (×8): qty 100

## 2023-04-02 MED ORDER — EZETIMIBE 10 MG PO TABS
10.0000 mg | ORAL_TABLET | Freq: Every day | ORAL | Status: DC
  Administered 2023-04-03 – 2023-04-18 (×15): 10 mg
  Filled 2023-04-02 (×15): qty 1

## 2023-04-02 MED ORDER — KETAMINE HCL 50 MG/5ML IJ SOSY
PREFILLED_SYRINGE | INTRAMUSCULAR | Status: AC
  Administered 2023-04-02: 50 mg via INTRAVENOUS
  Filled 2023-04-02: qty 10

## 2023-04-02 MED ORDER — AMIODARONE HCL IN DEXTROSE 360-4.14 MG/200ML-% IV SOLN
60.0000 mg/h | INTRAVENOUS | Status: AC
  Administered 2023-04-02 (×3): 60 mg/h via INTRAVENOUS
  Filled 2023-04-02 (×3): qty 200

## 2023-04-02 MED ORDER — BUSPIRONE HCL 10 MG PO TABS
30.0000 mg | ORAL_TABLET | Freq: Three times a day (TID) | ORAL | Status: AC | PRN
  Filled 2023-04-02: qty 3

## 2023-04-02 MED ORDER — POTASSIUM CHLORIDE 10 MEQ/100ML IV SOLN
10.0000 meq | INTRAVENOUS | Status: AC
  Administered 2023-04-02 (×2): 10 meq via INTRAVENOUS
  Filled 2023-04-02 (×2): qty 100

## 2023-04-02 MED ORDER — INSULIN ASPART 100 UNIT/ML IJ SOLN
0.0000 [IU] | INTRAMUSCULAR | Status: DC
  Administered 2023-04-02: 11 [IU] via SUBCUTANEOUS

## 2023-04-02 MED ORDER — CHLORHEXIDINE GLUCONATE CLOTH 2 % EX PADS
6.0000 | MEDICATED_PAD | Freq: Every day | CUTANEOUS | Status: DC
  Administered 2023-04-02 – 2023-04-18 (×17): 6 via TOPICAL

## 2023-04-02 NOTE — Progress Notes (Signed)
 PHARMACY - ANTICOAGULATION CONSULT NOTE  Pharmacy Consult for Heparin  Indication: chest pain/ACS - out-of-hospital VF arrest  No Known Allergies  Patient Measurements: Height: 5' 7 (170.2 cm) Weight: 85 kg (187 lb 6.3 oz) IBW/kg (Calculated) : 61.6 Heparin  Dosing Weight: 85 kg  Vital Signs: Temp: 96.3 F (35.7 C) (02/09 1645) Temp Source: Bladder (02/09 1600) BP: 109/73 (02/09 1645) Pulse Rate: 86 (02/09 1645)  Labs: Recent Labs    04/02/23 1249 04/02/23 1307 04/02/23 1415  HGB 13.2 14.6 13.9  HCT 42.2 43.0 41.0  PLT 218  --   --   CREATININE 1.14* 0.80  --   TROPONINIHS 223*  --   --     Estimated Creatinine Clearance: 89.1 mL/min (by C-G formula based on SCr of 0.8 mg/dL).   Medical History: Past Medical History:  Diagnosis Date   Arthritis    CAD (coronary artery disease), native coronary artery 07/30/2018   Diabetes mellitus without complication (HCC)    type II    Heart attack (HCC)    Hypertension    NSTEMI (non-ST elevated myocardial infarction) (HCC) 02/08/2012    Medications:  Awaiting home med rec  Assessment: 58 y.o. F presents s/p out-of-hospital VF arrest, successful ROSC after 15 minutes of ACLS per EMS. To begin heparin  for ACS. Head CT negative. No AC PTA. CBC stable on admission.  Goal of Therapy:  Heparin  level 0.3-0.7 units/ml Monitor platelets by anticoagulation protocol: Yes   Plan:  Heparin  IV bolus 4000 units Heparin  gtt at 1050 units/hr Will f/u heparin  level in hours Daily heparin  level and CBC   Vito Ralph, PharmD, BCPS Please see amion for complete clinical pharmacist phone list 04/02/2023,4:56 PM

## 2023-04-02 NOTE — Progress Notes (Signed)
 Pharmacy Antibiotic Note  Vickie Little is a 58 y.o. female admitted on 04/02/2023 presenting post-arrest, concern for aspiration.  Pharmacy has been consulted for Unasyn  dosing.  Plan: Unasyn  3g IV q 6h Monitor renal function, clinical response and LOT  Height: 5' 7 (170.2 cm) Weight: 85 kg (187 lb 6.3 oz) IBW/kg (Calculated) : 61.6  Temp (24hrs), Avg:95.3 F (35.2 C), Min:94.4 F (34.7 C), Max:96.1 F (35.6 C)  Recent Labs  Lab 04/02/23 1249 04/02/23 1307 04/02/23 1308  WBC 11.7*  --   --   CREATININE 1.14* 0.80  --   LATICACIDVEN  --   --  10.0*    Estimated Creatinine Clearance: 89.1 mL/min (by C-G formula based on SCr of 0.8 mg/dL).    No Known Allergies  Dorn Poot, PharmD, [facility] Clinical Pharmacist ED Pharmacist Phone # [phone] 04/02/2023 2:11 PM

## 2023-04-02 NOTE — Progress Notes (Signed)
 Afternoon rounds. Endotracheal tube changed over bougie, now has 7.5 ET tube CT of head reviewed and negative for acute findings, I have updated family we will go ahead and start IV heparin  Hemoglobin A1c 9.2 Arterial blood gas reviewed pH 7.3, pCO2 37 pO2 275 HCO3 18 Plan Will follow-up pending labs this afternoon Continue follow-up care as outlined on H&P

## 2023-04-02 NOTE — Progress Notes (Signed)
 Arterial Catheter Insertion Procedure Note  ARDELLE HALIBURTON  [id number]  [birth year]  Date:04/02/23  Time:9:59 PM    Provider Performing: Blanca Laurel    Procedure: Insertion of Arterial Line (63379) without US  guidance  Indication(s) Blood pressure monitoring and/or need for frequent ABGs  Consent Unable to obtain consent due to emergent nature of procedure.  Anesthesia None   Time Out Verified patient identification, verified procedure, site/side was marked, verified correct patient position, special equipment/implants available, medications/allergies/relevant history reviewed, required imaging and test results available.   Sterile Technique Maximal sterile technique including full sterile barrier drape, hand hygiene, sterile gown, sterile gloves, mask, hair covering, sterile ultrasound probe cover (if used).   Procedure Description Area of catheter insertion was cleaned with chlorhexidine  and draped in sterile fashion. Without real-time ultrasound guidance an arterial catheter was placed into the right radial artery.  Appropriate arterial tracings confirmed on monitor.     Complications/Tolerance None; patient tolerated the procedure well.   EBL Minimal   Specimen(s) None

## 2023-04-02 NOTE — Procedures (Signed)
 Central Venous Catheter Insertion Procedure Note  Vickie Little  [id number]  Aug 24, 1965  Date:04/02/23  Time:6:30 PM   Provider Performing:Pete FORBES Jenna   Procedure: Insertion of Non-tunneled Central Venous [phone]) with US  guidance (23062)   Indication(s) Difficult access  Consent Risks of the procedure as well as the alternatives and risks of each were explained to the patient and/or caregiver.  Consent for the procedure was obtained and is signed in the bedside chart  Anesthesia Topical only with 1% lidocaine    Timeout Verified patient identification, verified procedure, site/side was marked, verified correct patient position, special equipment/implants available, medications/allergies/relevant history reviewed, required imaging and test results available.  Sterile Technique Maximal sterile technique including full sterile barrier drape, hand hygiene, sterile gown, sterile gloves, mask, hair covering, sterile ultrasound probe cover (if used).  Procedure Description Area of catheter insertion was cleaned with chlorhexidine  and draped in sterile fashion.  With real-time ultrasound guidance a central venous catheter was placed into the left internal jugular vein. Nonpulsatile blood flow and easy flushing noted in all ports.  The catheter was sutured in place and sterile dressing applied.   Complications/Tolerance None; patient tolerated the procedure well. Chest X-ray is ordered to verify placement for internal jugular or subclavian cannulation.   Chest x-ray is not ordered for femoral cannulation.  EBL Minimal  Specimen(s) None

## 2023-04-02 NOTE — Procedures (Signed)
 History: 58 yo F s/p cardiac arrest  Sedation: Propofol  40 mcg/kg/min  Patient State: Comatose  Technique: This EEG was acquired with electrodes placed according to the International 10-20 electrode system (including Fp1, Fp2, F3, F4, C3, C4, P3, P4, O1, O2, T3, T4, T5, T6, A1, A2, Fz, Cz, Pz). The following electrodes were missing or displaced: none.   Background: The background is diffusely attenuated, with no definite cerebral activity seen.  Photic stimulation: Physiologic driving is not performed  EEG Abnormalities: 1) diffusely attenuated EEG  Clinical Interpretation: This EEG demonstrates evidence of a profound cerebral dysfunction as can be seen with severe hypoxic brain injury, medication induced coma, and diffuse structural injury, among other causes.   There was no seizure or seizure predisposition recorded on this study. Please note that lack of epileptiform activity on EEG does not preclude the possibility of epilepsy.   Aisha Seals, MD [facility] [phone]  If 7pm- 7am, please page neurology on call as listed in AMION.

## 2023-04-02 NOTE — Consult Note (Addendum)
 Cardiology Consultation   Patient ID: Vickie Little MRN: [id number]; DOB: 1965/09/14  Admit date: 04/02/2023 Date of Consult: 04/02/2023  PCP:  Jolee Madelin Patch, MD   [facility] Providers Cardiologist:  Madonna Large, DO   { \ Patient Profile:   Vickie Little is a 58 y.o. female with a hx of CAD with DES to the LAD 2013, hypertension, diabetes, hyperlipidemia, former smoker, PAD, neurogenic lumbar claudication who is being seen 04/02/2023 for the evaluation of VF arrest at the request of critical care.  History of Present Illness:   Vickie Little recently transferred care to [facility] earlier this year in January.  Normal EF in 2013.  Overall had stable CAD with no anginal complaints at her last appointment for the last year.  Denied any CHF symptoms at last appointment.  Echo was ordered but not yet done.  Seems to be uncontrolled diabetic with an A1c over time.  Currently patient being admitted for VF arrest.  Reportedly was at church singing in the choir had a witnessed collapse.  EMS arrived and was noted to have pulseless VF.  She had 5 shocks, 2 epinephrine , 300 mg bolus of amiodarone .  Reportedly had ROSC after 15 minutes.  EKG with no acute ST-T wave changes.  Lactic acid 10.  Glucose 408.  She is intubated but not on vasopressor support.  Family is not around for interview.  Chest x-ray negative.  Creatinine 0.8.  Transaminitis.  Troponin 223.  WBC 11.7.  Hemoglobin 14.6.   Past Medical History:  Diagnosis Date   Arthritis    CAD (coronary artery disease), native coronary artery 07/30/2018   Diabetes mellitus without complication (HCC)    type II    Heart attack (HCC)    Hypertension    NSTEMI (non-ST elevated myocardial infarction) (HCC) 02/08/2012    Past Surgical History:  Procedure Laterality Date   BREAST BIOPSY Right 01/10/2023   MM RT BREAST BX W LOC DEV 1ST LESION IMAGE BX SPEC STEREO GUIDE 01/10/2023 [facility]MO[facility]AP[facility]   CORONARY ANGIOPLASTY  WITH STENT PLACEMENT     LEFT HEART CATHETERIZATION WITH CORONARY ANGIOGRAM N/A 02/13/2012   Procedure: LEFT HEART CATHETERIZATION WITH CORONARY ANGIOGRAM;  Surgeon: Oneil Parchment, MD;  Location: [facility] CATH LAB;  Service: Cardiovascular;  Laterality: N/A;   LUMBAR LAMINECTOMY/DECOMPRESSION MICRODISCECTOMY Left 04/20/2016   Procedure: 1. central decompression for spinal stenosis   1.central decompression for spinal stenosis L5-S1  2.foramionotomy L5 root and S1nerve root left       3. Microdisectomy L5-S1 left  ;  Surgeon: Tanda Heading, MD;  Location: [facility] ORS;  Service: Orthopedics;  Laterality: Left;    Inpatient Medications: Scheduled Meds:  acetaminophen   650 mg Oral Q4H   Or   acetaminophen  (TYLENOL ) oral liquid 160 mg/5 mL  650 mg Per Tube Q4H   Or   acetaminophen   650 mg Rectal Q4H   aspirin   81 mg Per Tube Daily   [START ON 04/03/2023] atorvastatin   20 mg Per Tube Daily   docusate  100 mg Per Tube BID   [START ON 04/03/2023] ezetimibe   10 mg Per Tube Daily   famotidine   20 mg Per Tube BID   fentaNYL  (SUBLIMAZE ) injection  50 mcg Intravenous Once   insulin  aspart  0-20 Units Subcutaneous Q4H   polyethylene glycol  17 g Per Tube Daily   Continuous Infusions:  sodium chloride      amiodarone  60 mg/hr (04/02/23 1305)   amiodarone      ampicillin -sulbactam (  UNASYN ) IV     fentaNYL  infusion INTRAVENOUS     magnesium  sulfate     norepinephrine  (LEVOPHED ) Adult infusion     propofol  (DIPRIVAN ) infusion 5 mcg/kg/min (04/02/23 1311)   PRN Meds: [START ON 04/04/2023] acetaminophen  **OR** [START ON 04/04/2023] acetaminophen  (TYLENOL ) oral liquid 160 mg/5 mL **OR** [START ON 04/04/2023] acetaminophen , busPIRone  **OR** busPIRone , fentaNYL , magnesium  sulfate, ondansetron  (ZOFRAN ) IV  Allergies:   No Known Allergies  Social History:   Social History   Tobacco Use   Smoking status: Former    Current packs/day: 0.00    Types: Cigarettes    Quit date: 08/22/1995    Years since quitting: 27.6    Smokeless tobacco: Never  Substance Use Topics   Alcohol use: Yes    Comment: socially. 2-3 beers on weekend.     Family History:   Family History  Adopted: Yes  Problem Relation Age of Onset   Diabetes Father      ROS:  Please see the history of present illness.  All other ROS reviewed and negative.     Physical Exam/Data:   Vitals:   04/02/23 1318 04/02/23 1330 04/02/23 1345 04/02/23 1400  BP: 114/77 109/69 113/78 114/72  Pulse: 96 88 88 83  Resp: (!) 22 18 19 20   Temp:   (!) 94.4 F (34.7 C) (!) 96.1 F (35.6 C)  SpO2:  100% 100% 100%  Weight:      Height: 5' 7 (1.702 m)      No intake or output data in the 24 hours ending 04/02/23 1435    04/02/2023    1:00 PM 02/24/2023    8:47 AM 09/13/2022   12:30 PM  Last 3 Weights  Weight (lbs) 187 lb 6.3 oz 188 lb 12.8 oz 184 lb  Weight (kg) 85 kg 85.639 kg 83.462 kg     Body mass index is 29.35 kg/m.  General: On mechanical ventilation.  HEENT: normal Neck: no JVD Vascular: No carotid bruits; Distal pulses 2+ bilaterally Cardiac:  normal S1, S2; RRR; no murmur  Lungs:  clear to auscultation bilaterally, no wheezing, rhonchi or rales  Abd: soft, nontender, no hepatomegaly  Ext: no edema Musculoskeletal:  No deformities, BUE and BLE strength normal and equal Skin: warm and dry  Neuro:  CNs 2-12 intact, no focal abnormalities noted Psych:  Normal affect   EKG:  The EKG was personally reviewed and demonstrates: Sinus rhythm, heart rate 86.  No acute ST-T wave changes. Telemetry:  Telemetry was personally reviewed and demonstrates: Sinus rhythm heart rates in the 80s.  PVCs  Relevant CV Studies: Echocardiogram: 01/2012: LVEF 55 to 60%.  See report for additional details   Stress Testing: Lexiscan sestamibi stress test 04/11/2016: Low risk study.   See report for additional details    Coronary angiogram 02/13/2012:  Mid LAD Overlapping 3.0 x 16 mm and2.75 x 38 mm Promus DES.  Diagonal vessels with ostial 95%  stenosis, small calibered.  Mild disease in circumflex and right coronary artery.  EF 55%.   ABI 12/26/2018: This exam reveals mildly decreased perfusion of the right lower extremity, noted at the dorsalis pedis artery level (ABI 0.81). This exam reveals moderately decreased perfusion of the left lower extremity, noted at the dorsalis pedis artery level (ABI 0.69).  Compared to lower extremity arterial duplex/ABI done on 01/10/2018, no significant change.  Laboratory Data:  High Sensitivity Troponin:   Recent Labs  Lab 04/02/23 1249  TROPONINIHS 223*     Chemistry Recent  Labs  Lab 04/02/23 1249 04/02/23 1307 04/02/23 1415  NA 136 136 137  K 3.9 3.9 3.8  CL 103 105  --   CO2 13*  --   --   GLUCOSE 399* 408*  --   BUN 11 11  --   CREATININE 1.14* 0.80  --   CALCIUM  8.9  --   --   MG 1.9  --   --   GFRNONAA 57*  --   --   ANIONGAP 20*  --   --     Recent Labs  Lab 04/02/23 1249  PROT 6.5  ALBUMIN  3.6  AST 288*  ALT 302*  ALKPHOS 263*  BILITOT 1.1   Hematology Recent Labs  Lab 04/02/23 1249 04/02/23 1307 04/02/23 1415  WBC 11.7*  --   --   RBC 4.99  --   --   HGB 13.2 14.6 13.9  HCT 42.2 43.0 41.0  MCV 84.6  --   --   MCH 26.5  --   --   MCHC 31.3  --   --   RDW 13.9  --   --   PLT 218  --   --     Radiology/Studies:  DG Abdomen 1 View Result Date: 04/02/2023 CLINICAL DATA:  Post OG tube placement EXAM: ABDOMEN - 1 VIEW COMPARISON:  12/14/2014 FINDINGS: Enteric tube tip and side-port in the stomach. Defibrillator pads. Large stool burden in the right colon. IMPRESSION: Enteric tube tip and side-port in the stomach. Electronically Signed   By: Norman Gatlin M.D.   On: 04/02/2023 13:52   DG Chest Portable 1 View Result Date: 04/02/2023 CLINICAL DATA:  Status post intubation. EXAM: PORTABLE CHEST 1 VIEW COMPARISON:  Chest x-ray dated April 15, 2016. FINDINGS: Endotracheal tube tip at the carina. The heart size and mediastinal contours are within normal  limits. Low lung volumes with streaky atelectasis in the right-greater-than-left upper lobes. No focal consolidation, pleural effusion, or pneumothorax. No acute osseous abnormality. Distended stomach. IMPRESSION: 1. Endotracheal tube tip at the carina. Recommend retraction. 2. No acute cardiopulmonary disease. Electronically Signed   By: Elsie ONEIDA Shoulder M.D.   On: 04/02/2023 13:17     Assessment and Plan:   Witnessed syncope with VF arrest Was singing in choir and suddenly collapsed.  Do not know exact downtime but shocked x 5, epi x 2, bolused with amnio 300.  ROSC obtained after about 15 minutes.  Unclear etiology at this time but presentation seems consistent with ACS despite no ischemic ST-T wave changes, first troponin 223.  Lactic 10.  Bedside echo I think shows reduced RV function, LVEF difficult to assess with pad placement. Although less likely, still need to consider possible PE/DKA. Admitted to CCM. Continue IV heparin , continue IV amiodarone , aspirin , statin, Zetia  Get echocardiogram Eventually will need heart catheterization. Consider getting CTA of the chest.  CAD status post DES to LAD in 2013  Type 2 diabetes Reportedly A1c greater than 10% in September 2024.  Glucose here over 400+  Mechanical ventilation Lactic acidosis Transaminitis Postarrest care per critical care.  Risk Assessment/Risk Scores:   TIMI Risk Score for Unstable Angina or Non-ST Elevation MI:   The patient's TIMI risk score is 4, which indicates a 20% risk of all cause mortality, new or recurrent myocardial infarction or need for urgent revascularization in the next 14 days.{   For questions or updates, please contact [facility] Please consult [email or web address] for contact info under    Signed,  Thom LITTIE Sluder, PA-C  04/02/2023 2:35 PM    Attending note:  Patient seen and examined.  I reviewed her records and discussed the case with Mr. Sluder RIGGERS, I agree with his above findings.  Ms.  Lenord presents after a witnessed syncopal event that occurred while she was singing in the choir at church today.  EMS summoned and she was found to be in VF with ACLS protocol initiated, reportedly shocked 5 times and ultimately obtained ROSC after about 15 minutes.  Subsequent ECG shows sinus rhythm with nonspecific ST changes, does not show STEMI criteria.  Lactate initially 10, glucose 408, high-sensitivity troponin I 223, also noted to be positive for SARS coronavirus 2.  Chest x-ray reports no acute process, streaky atelectasis noted.  She has a history of CAD status post DES to the LAD in 2013 and L5 psych facility, no obvious angina at recent office visit with Dr. Michele in January.  Reportedly had low risk Myoview in 2018, LVEF was 55 to 60% as of 2013.  On examination patient is currently intubated and on the ventilator.  Temperature 96.1 degrees, heart rate 70-80 and in sinus rhythm by telemetry, blood pressure 115/74.  Lungs are clear anteriorly.  Cardiac exam with RRR and soft systolic murmur at the base, no gallop, no peripheral edema.  Additional lab work includes BUN 11, creatinine 0.8, potassium 3.9, hemoglobin 14.6.  Patient status post out-of-hospital VF arrest, successful ROSC after 15 minutes of ACLS per EMS.  Presently sedated on the ventilator and continues on IV amiodarone  with plan to initiate IV heparin  after clearing head CT.  Also aspirin , statin, and obtain complete echocardiogram.  ECG does not show STEMI criteria.  We will continue to follow and depending on neurological recovery can plan on further cardiac evaluation.  Jayson JUDITHANN Sierras, M.D., F.A.C.C.

## 2023-04-02 NOTE — H&P (Signed)
 NAME:  Vickie Little, MRN:  [id number], DOB:  1965-04-09, LOS: 0 ADMISSION DATE:  04/02/2023, CONSULTATION DATE:  2/9 REFERRING MD:  Jhonny, CHIEF COMPLAINT:  cardiac arrest    History of Present Illness:  58 year old female patient with history of metabolic syndrome, and coronary artery disease followed by cardiology last seen in January 2025.  Was at church singing in choir, had a witnessed collapse.  EMS was called on fire arrival the patient was agonal with weak pulses. On EMS arrival the patient was pulseless CPR was initiated, initial rhythm VF, ACLS interventions included epinephrine  x 2, defibrillation x 5, amiodarone  300 mg.  An IO was placed in the right humerus she was intubated return of spontaneous circulation estimated at 15 minutes On arrival to [facility] initial EKG negative for ST elevation white blood cell count 11.7 glucose 408 post CPR lactic acid 10 Cardiology consulted by EDP Critical care asked to admit.  Pertinent  Medical History  Coronary artery disease, with prior angina seen by cardiology last in January 2025 type 2 diabetes with retinopathy, last A1c of record 9.5, hypertension, hyperlipidemia Remote herniated disc Obesity  Significant Hospital Events: Including procedures, antibiotic start and stop dates in addition to other pertinent events   2/9 admitted status post VF arrest time to return of spontaneous circulation estimated at 15 minutes hemodynamically stable on arrival to the ER decorticate posturing  Interim History / Subjective:  Decorticate posturing to painful stim   Objective   Blood pressure 109/69, pulse 88, resp. rate 18, height 5' 7 (1.702 m), weight 85 kg, last menstrual period 03/25/2016, SpO2 100%.    Vent Mode: PRVC FiO2 (%):  [100 %] 100 % Set Rate:  [20 bmp] 20 bmp Vt Set:  [490 mL] 490 mL PEEP:  [5 cmH20] 5 cmH20 Plateau Pressure:  [16 cmH20] 16 cmH20  No intake or output data in the 24 hours ending 04/02/23 1400 Filed  Weights   04/02/23 1300  Weight: 85 kg    Examination: General: 58 year old female patient currently lying in bed postcardiac arrest sedated on propofol  and on full ventilatory support HENT: Orally intubated normocephalic mucous membranes moist no obvious JVD Lungs: Clear diminished bilaterally portable chest x-ray low volume, large amount of gas in the stomach.  Evolving bilateral upper lobe airspace disease endotracheal tube at the level of the carina, needs to be pulled back satisfactory position Cardiovascular: Regular rate and rhythm currently normal sinus Abdomen: Soft not tender Extremities: Warm strong pulses right humeral IO's been removed and dressings intact Neuro: Bilateral upper extremity decorticate posturing with noxious stimulus off from propofol  positive cough bites tube no purposeful movement on initial exam GU: Pending Foley catheter placement  Resolved Hospital Problem list     Assessment & Plan:  Status post VF arrest with known history of coronary artery disease Plan Admit to the intensive care IV heparin  x 48 hours, if troponin elevated and CT head negative for bleed Via tube aspirin  Continue statin Echocardiogram Continue amiodarone  Additional recommendations per cardiology team Will almost certainly need ischemia evaluation however this will depend on hospital course  Lactic acidosis.  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 Plan Normothermia protocol Avoiding fever Keep euglycemic Ensure adequate mean arterial pressure  Diabetes with hyperglycemia Plan Following up capillary blood  glucose, Checking beta hydroxybutyric acid Initiate glycemic control with blood glucose goal 140-180, she may need infusion if beta hydroxybutyric acid positive   Best Practice (right click and Reselect all SmartList Selections daily)   Diet/type: NPO DVT prophylaxis systemic heparin  Pressure ulcer(s): N/A GI prophylaxis: H2B Lines: N/A Foley:  Yes, and it is still needed Code Status:  full code Last date of multidisciplinary goals of care discussion [pending]  Labs   CBC: Recent Labs  Lab 04/02/23 1249 04/02/23 1307  WBC 11.7*  --   HGB 13.2 14.6  HCT 42.2 43.0  MCV 84.6  --   PLT 218  --     Basic Metabolic Panel: Recent Labs  Lab 04/02/23 1249 04/02/23 1307  NA PENDING 136  K PENDING 3.9  CL PENDING 105  CO2 PENDING  --   GLUCOSE PENDING 408*  BUN PENDING 11  CREATININE PENDING 0.80  CALCIUM  PENDING  --    GFR: Estimated Creatinine Clearance: 89.1 mL/min (by C-G formula based on SCr of 0.8 mg/dL). Recent Labs  Lab 04/02/23 1249 04/02/23 1308  WBC 11.7*  --   LATICACIDVEN  --  10.0*    Liver Function Tests: Recent Labs  Lab 04/02/23 1249  AST PENDING  ALT PENDING  ALKPHOS PENDING  BILITOT PENDING  PROT PENDING  ALBUMIN  PENDING   No results for input(s): LIPASE, AMYLASE in the last 168 hours. No results for input(s): AMMONIA in the last 168 hours.  ABG    Component Value Date/Time   PHART 7.299 (L) 02/08/2012 1615   PCO2ART 17.1 (LL) 02/08/2012 1615   PO2ART 106.0 (H) 02/08/2012 1615   HCO3 8.4 (L) 02/08/2012 1615   TCO2 17 (L) 04/02/2023 1307   ACIDBASEDEF 15.0 (H) 02/08/2012 1615   O2SAT 98.0 02/08/2012 1615     Coagulation Profile: No results for input(s): INR, PROTIME in the last 168 hours.  Cardiac Enzymes: No results for input(s): CKTOTAL, CKMB, CKMBINDEX, TROPONINI in the last 168 hours.  HbA1C: Hgb A1c MFr Bld  Date/Time Value Ref Range Status  04/15/2016 09:57 AM 10.5 (H) 4.8 - 5.6 % Final     Comment:    (NOTE)         Pre-diabetes: 5.7 - 6.4         Diabetes: >6.4         Glycemic control for adults with diabetes: <7.0   02/08/2012 09:00 PM 13.0 (H) <5.7 % Final    Comment:    (NOTE)                                                                       According to the ADA Clinical Practice Recommendations for 2011, when HbA1c is used as a screening test:  >=6.5%   Diagnostic of Diabetes Mellitus           (if abnormal result is confirmed) 5.7-6.4%   Increased risk of developing Diabetes Mellitus References:Diagnosis and Classification of Diabetes Mellitus,Diabetes Care,2011,34(Suppl 1):S62-S69 and Standards of Medical Care in         Diabetes - 2011,Diabetes Care,2011,34 (Suppl 1):S11-S61.    CBG: No results for input(s): GLUCAP in the last 168 hours.  Review of  Systems:   Not able   Past Medical History:  She,  has a past medical history of Arthritis, CAD (coronary artery disease), native coronary artery (07/30/2018), Diabetes mellitus without complication (HCC), Heart attack (HCC), Hypertension, and NSTEMI (non-ST elevated myocardial infarction) (HCC) (02/08/2012).   Surgical History:   Past Surgical History:  Procedure Laterality Date   BREAST BIOPSY Right 01/10/2023   MM RT BREAST BX W LOC DEV 1ST LESION IMAGE BX SPEC STEREO GUIDE 01/10/2023 [facility] MAMMOGRAPHY   CORONARY ANGIOPLASTY WITH STENT PLACEMENT     LEFT HEART CATHETERIZATION WITH CORONARY ANGIOGRAM N/A 02/13/2012   Procedure: LEFT HEART CATHETERIZATION WITH CORONARY ANGIOGRAM;  Surgeon: Oneil Parchment, MD;  Location: [facility] CATH LAB;  Service: Cardiovascular;  Laterality: N/A;   LUMBAR LAMINECTOMY/DECOMPRESSION MICRODISCECTOMY Left 04/20/2016   Procedure: 1. central decompression for spinal stenosis   1.central decompression for spinal stenosis L5-S1  2.foramionotomy L5 root and S1nerve root left       3. Microdisectomy L5-S1 left  ;  Surgeon: Tanda Heading, MD;  Location: [facility];  Service: Orthopedics;   Laterality: Left;     Social History:   reports that she quit smoking about 27 years ago. Her smoking use included cigarettes. She has never used smokeless tobacco. She reports current alcohol use. She reports that she does not use drugs.   Family History:  Her family history includes Diabetes in her father. She was adopted.   Allergies No Known Allergies   Home Medications  Prior to Admission medications   Medication Sig Start Date End Date Taking? Authorizing Provider  aspirin  EC 81 MG tablet Take 81 mg by mouth daily.    [provider]  atorvastatin  (LIPITOR ) 20 MG tablet TAKE 1 TABLET(20 MG) BY MOUTH DAILY 06/24/19   Ladona Heinz, MD  cetirizine  (ZYRTEC ) 10 MG tablet Take 1 tablet (10 mg total) by mouth daily. Patient not taking: Reported on 02/24/2023 04/25/22   Hazen Darryle BRAVO, FNP  cholecalciferol (VITAMIN D3) 25 MCG (1000 UNIT) tablet Vitamin D     [provider]  cilostazol  (PLETAL ) 50 MG tablet TAKE 1 TABLET(50 MG) BY MOUTH TWICE DAILY Patient not taking: Reported on 02/24/2023 08/20/21   Cantwell, Sherran C, PA-C  dapagliflozin propanediol (FARXIGA) 10 MG TABS tablet Take 10 mg by mouth daily. 12/27/19   [provider]  ezetimibe  (ZETIA ) 10 MG tablet Take 1 tablet (10 mg total) by mouth daily. 02/24/23   Tolia, Sunit, DO  fluticasone  (FLONASE ) 50 MCG/ACT nasal spray Place 1 spray into both nostrils daily. Patient not taking: Reported on 02/24/2023 03/17/22   Billy Asberry FALCON, PA-C  glimepiride (AMARYL) 2 MG tablet Take 2 mg by mouth daily with breakfast. 04/07/22   [provider]  metFORMIN (GLUCOPHAGE) 500 MG tablet Take 1,000 mg by mouth 2 (two) times daily with a meal.     [provider]  metoprolol  succinate (TOPROL -XL) 25 MG 24 hr tablet TAKE 1 TABLET(25 MG) BY MOUTH DAILY Patient not taking: Reported on 02/24/2023 03/29/21   Thomasena Sherran C, PA-C  Multiple Vitamin (MULTIVITAMIN WITH MINERALS) TABS tablet Take 1 tablet by mouth daily. Hasn't  had in a week    [provider]  Phenylephrine -DM-GG (TUSSIN CF PO) Take by mouth. Patient not taking: Reported on 02/24/2023    [provider]  Semaglutide,0.25 or 0.5MG /DOS, (OZEMPIC, 0.25 OR 0.5 MG/DOSE,) 2 MG/1.5ML SOPN Inject 0.5 mg into the skin once a week.    [provider]  TRULICITY 4.5 MG/0.5ML SOPN Inject  into the skin. Patient not taking: Reported on 02/24/2023 06/14/20   [provider]     Critical care time: 32 min

## 2023-04-02 NOTE — Progress Notes (Signed)
 RT x 2 at bedside with CCM for a bougie ETT exchange. 7.5 ETT placed at 24 at the lip. No complications noted with ETT exchange.

## 2023-04-02 NOTE — Procedures (Signed)
 Intubation Procedure Note  MOSELLA KASA  [id number]  [birth year]  Date:04/02/23  Time:4:32 PM   Provider Performing:Pete E Jenna    Procedure: Intubation (31500)  Indication(s) Respiratory Failure  Consent Risks of the procedure as well as the alternatives and risks of each were explained to the patient and/or caregiver.  Consent for the procedure was obtained and is signed in the bedside chart   Anesthesia Etomidate , Versed , and Rocuronium    Time Out Verified patient identification, verified procedure, site/side was marked, verified correct patient position, special equipment/implants available, medications/allergies/relevant history reviewed, required imaging and test results available.   Sterile Technique Usual hand hygeine, masks, and gloves were used   Procedure Description Patient positioned in bed supine.  Sedation given as noted above.  Patient was intubated with endotracheal tube using  bougie tube exchanger after the patient was sedated the bougie was placed into the existing #6 tracheostomy and advanced to approximately 34 cm at that point the #6 endotracheal tube was removed, and a #7.5 endotracheal tube was placed without difficulty .  I verified tube placement with glide scope post intubation as well as 1.  Colorimetric CO2 detector was consistent with tracheal placement.   Complications/Tolerance None; patient tolerated the procedure well. Chest X-ray is ordered to verify placement.   EBL Minimal   Specimen(s) None

## 2023-04-02 NOTE — Progress Notes (Signed)
 Pt transported from ED to CT, then CT to 2H without event.

## 2023-04-02 NOTE — ED Triage Notes (Addendum)
 Patient arrived by EMS post CPR. Patient was singing in choir today and collapsed. On FIRE arrival weak pulses with agonal respirations. On EMS arrival CPR started at 1257-ROSC At 1212pm-15 minute total of CPR Patient received the following:6.0 ETT Defib for V-fib x 5 Epix x 2 Amiodarone  300 NS 500 Fentanyl  100-biting tube Versed  2.5 -biting tube CBG 230 PIV left wrist IO right hum

## 2023-04-02 NOTE — ED Provider Notes (Signed)
 [place] EMERGENCY DEPARTMENT AT [facility] Provider Note   CSN: [id number] Arrival date & time: 04/02/23  1247     History  Chief Complaint  Patient presents with   Cardiac Arrest    Vickie Little is a 58 y.o. female.  Pt with hx cad/stent, presents after witnessed arrest at church. No bystander cpr, ems was called. On ems arrival notes no pulses, agonal resp effort. EMS started CPR at 1157, initial rhythm, vfib, gave epi x 2, amio bolus, shock x 5, placed ETT, cpr. At/around 1212 with return of pulses. EMS have given fentanyl  100 mcg iv and versed  2.5 mg iv. Pt unresponsive/intubated - level 5 caveat.   The history is provided by the patient, medical records and the EMS personnel. The history is limited by the condition of the patient.       Home Medications Prior to Admission medications   Medication Sig Start Date End Date Taking? Authorizing Provider  aspirin  EC 81 MG tablet Take 81 mg by mouth daily.    [provider]  atorvastatin  (LIPITOR ) 20 MG tablet TAKE 1 TABLET(20 MG) BY MOUTH DAILY 06/24/19   Ladona Heinz, MD  cetirizine  (ZYRTEC ) 10 MG tablet Take 1 tablet (10 mg total) by mouth daily. Patient not taking: Reported on 02/24/2023 04/25/22   Hazen Darryle BRAVO, FNP  cholecalciferol (VITAMIN D3) 25 MCG (1000 UNIT) tablet Vitamin D     [provider]  cilostazol  (PLETAL ) 50 MG tablet TAKE 1 TABLET(50 MG) BY MOUTH TWICE DAILY Patient not taking: Reported on 02/24/2023 08/20/21   Cantwell, Sherran C, PA-C  dapagliflozin propanediol (FARXIGA) 10 MG TABS tablet Take 10 mg by mouth daily. 12/27/19   [provider]  ezetimibe  (ZETIA ) 10 MG tablet Take 1 tablet (10 mg total) by mouth daily. 02/24/23   Tolia, Sunit, DO  fluticasone  (FLONASE ) 50 MCG/ACT nasal spray Place 1 spray into both nostrils daily. Patient not taking: Reported on 02/24/2023 03/17/22   Billy Asberry FALCON, PA-C  glimepiride (AMARYL) 2 MG tablet Take 2 mg by mouth daily with breakfast.  04/07/22   [provider]  metFORMIN (GLUCOPHAGE) 500 MG tablet Take 1,000 mg by mouth 2 (two) times daily with a meal.     [provider]  metoprolol  succinate (TOPROL -XL) 25 MG 24 hr tablet TAKE 1 TABLET(25 MG) BY MOUTH DAILY Patient not taking: Reported on 02/24/2023 03/29/21   Thomasena Sherran C, PA-C  Multiple Vitamin (MULTIVITAMIN WITH MINERALS) TABS tablet Take 1 tablet by mouth daily. Hasn't had in a week    [provider]  Phenylephrine -DM-GG (TUSSIN CF PO) Take by mouth. Patient not taking: Reported on 02/24/2023    [provider]  Semaglutide,0.25 or 0.5MG /DOS, (OZEMPIC, 0.25 OR 0.5 MG/DOSE,) 2 MG/1.5ML SOPN Inject 0.5 mg into the skin once a week.    [provider]  TRULICITY 4.5 MG/0.5ML SOPN Inject into the skin. Patient not taking: Reported on 02/24/2023 06/14/20   [provider]      Allergies    Patient has no known allergies.    Review of Systems   Review of Systems  Unable to perform ROS: Patient unresponsive    Physical Exam Updated Vital Signs BP 115/74   Pulse (!) 114   Temp (!) 96.1 F (35.6 C)   Resp (!) 31   Ht 1.702 m (5' 7)   Wt 85 kg   LMP 03/25/2016   SpO2 100%   BMI 29.35 kg/m  Physical Exam Vitals  and nursing note reviewed.  Constitutional:      Appearance: She is well-developed.     Comments: Being bag ventilated, ETT, sedated, unresponsive.   HENT:     Head: Atraumatic.     Nose: Nose normal.     Mouth/Throat:     Mouth: Mucous membranes are moist.     Comments: ETT Eyes:     General: No scleral icterus.    Conjunctiva/sclera: Conjunctivae normal.     Pupils: Pupils are equal, round, and reactive to light.  Neck:     Trachea: No tracheal deviation.     Comments: Trachea midline.  Cardiovascular:     Rate and Rhythm: Normal rate and regular rhythm.     Pulses: Normal pulses.     Heart sounds: Normal heart sounds. No murmur heard.    No friction rub. No gallop.  Pulmonary:      Breath sounds: Normal breath sounds.     Comments: Pt is breathing on own. Bag assisted ventilation via ett.  Bil bs.  Abdominal:     General: There is no distension.     Palpations: Abdomen is soft. There is no mass.     Tenderness: There is no abdominal tenderness.  Musculoskeletal:        General: No swelling or tenderness.     Cervical back: No muscular tenderness.     Right lower leg: No edema.     Left lower leg: No edema.  Skin:    General: Skin is warm and dry.     Findings: No rash.  Neurological:     Comments: Unresponsive.      ED Results / Procedures / Treatments   Labs (all labs ordered are listed, but only abnormal results are displayed) Results for orders placed or performed during the hospital encounter of 04/02/23  Comprehensive metabolic panel   Collection Time: 04/02/23 12:49 PM  Result Value Ref Range   Sodium 136 135 - 145 mmol/L   Potassium 3.9 3.5 - 5.1 mmol/L   Chloride 103 98 - 111 mmol/L   CO2 13 (L) 22 - 32 mmol/L   Glucose, Bld 399 (H) 70 - 99 mg/dL   BUN 11 6 - 20 mg/dL   Creatinine, Ser 8.85 (H) 0.44 - 1.00 mg/dL   Calcium  8.9 8.9 - 10.3 mg/dL   Total Protein 6.5 6.5 - 8.1 g/dL   Albumin  3.6 3.5 - 5.0 g/dL   AST 711 (H) 15 - 41 U/L   ALT 302 (H) 0 - 44 U/L   Alkaline Phosphatase 263 (H) 38 - 126 U/L   Total Bilirubin 1.1 0.0 - 1.2 mg/dL   GFR, Estimated 57 (L) >60 mL/min   Anion gap 20 (H) 5 - 15  CBC   Collection Time: 04/02/23 12:49 PM  Result Value Ref Range   WBC 11.7 (H) 4.0 - 10.5 K/uL   RBC 4.99 3.87 - 5.11 MIL/uL   Hemoglobin 13.2 12.0 - 15.0 g/dL   HCT 57.7 63.9 - 53.9 %   MCV 84.6 80.0 - 100.0 fL   MCH 26.5 26.0 - 34.0 pg   MCHC 31.3 30.0 - 36.0 g/dL   RDW 86.0 88.4 - 84.4 %   Platelets 218 150 - 400 K/uL   nRBC 0.0 0.0 - 0.2 %  Magnesium    Collection Time: 04/02/23 12:49 PM  Result Value Ref Range   Magnesium  1.9 1.7 - 2.4 mg/dL  Troponin I (High Sensitivity)   Collection Time: 04/02/23 12:49 PM  Result Value Ref  Range   Troponin I (High Sensitivity) 223 (HH) <18 ng/L  SARS Coronavirus 2 by RT PCR (hospital order, performed in [facility] hospital lab) *cepheid single result test* Anterior Nasal Swab   Collection Time: 04/02/23 12:56 PM   Specimen: Anterior Nasal Swab  Result Value Ref Range   SARS Coronavirus 2 by RT PCR POSITIVE (A) NEGATIVE  I-stat chem 8, ED   Collection Time: 04/02/23  1:07 PM  Result Value Ref Range   Sodium 136 135 - 145 mmol/L   Potassium 3.9 3.5 - 5.1 mmol/L   Chloride 105 98 - 111 mmol/L   BUN 11 6 - 20 mg/dL   Creatinine, Ser 9.19 0.44 - 1.00 mg/dL   Glucose, Bld 591 (H) 70 - 99 mg/dL   Calcium , Ion 1.09 (L) 1.15 - 1.40 mmol/L   TCO2 17 (L) 22 - 32 mmol/L   Hemoglobin 14.6 12.0 - 15.0 g/dL   HCT 56.9 63.9 - 53.9 %  I-Stat Lactic Acid   Collection Time: 04/02/23  1:08 PM  Result Value Ref Range   Lactic Acid, Venous 10.0 (HH) 0.5 - 1.9 mmol/L   Comment NOTIFIED PHYSICIAN   I-Stat arterial blood gas, ED   Collection Time: 04/02/23  2:15 PM  Result Value Ref Range   pH, Arterial 7.297 (L) 7.35 - 7.45   pCO2 arterial 37.0 32 - 48 mmHg   pO2, Arterial 275 (H) 83 - 108 mmHg   Bicarbonate 18.4 (L) 20.0 - 28.0 mmol/L   TCO2 20 (L) 22 - 32 mmol/L   O2 Saturation 100 %   Acid-base deficit 8.0 (H) 0.0 - 2.0 mmol/L   Sodium 137 135 - 145 mmol/L   Potassium 3.8 3.5 - 5.1 mmol/L   Calcium , Ion 1.25 1.15 - 1.40 mmol/L   HCT 41.0 36.0 - 46.0 %   Hemoglobin 13.9 12.0 - 15.0 g/dL   Patient temperature 03.8 F    Collection site RADIAL, ALLEN'S TEST ACCEPTABLE    Drawn by RT    Sample type ARTERIAL       EKG EKG Interpretation Date/Time:  Sunday April 02 2023 12:54:52 EST Ventricular Rate:  96 PR Interval:  171 QRS Duration:  91 QT Interval:  365 QTC Calculation: 462 R Axis:   56  Text Interpretation: Sinus rhythm Nonspecific ST abnormality Confirmed by Bernard Drivers ([id number]) on 04/02/2023 12:56:39 PM  Radiology CT Cervical Spine Wo Contrast Result Date:  04/02/2023 CLINICAL DATA:  Cardiac arrest, intubated, neck trauma EXAM: CT CERVICAL SPINE WITHOUT CONTRAST TECHNIQUE: Multidetector CT imaging of the cervical spine was performed without intravenous contrast. Multiplanar CT image reconstructions were also generated. RADIATION DOSE REDUCTION: This exam was performed according to the departmental dose-optimization program which includes automated exposure control, adjustment of the mA and/or kV according to patient size and/or use of iterative reconstruction technique. COMPARISON:  None Available. FINDINGS: Alignment: Alignment is anatomic. Skull base and vertebrae: No acute fracture. No primary bone lesion or focal pathologic process. Soft tissues and spinal canal: No prevertebral fluid or swelling. No visible canal hematoma. Disc levels:  No significant spondylosis or facet hypertrophy. Upper chest: Dependent consolidation is seen within the left upper lobe, which may be hypoventilatory given intubation. Endotracheal tube and enteric catheter are partially visualized. Other: Reconstructed images demonstrate no additional findings. IMPRESSION: 1. No acute cervical spine fracture. 2. Dependent left upper lobe consolidation, which may reflect atelectasis given intubation. Electronically Signed   By: Ozell Daring M.D.   On:  04/02/2023 15:14   CT HEAD WO CONTRAST ( ) Result Date: 04/02/2023 CLINICAL DATA:  Anoxic brain damage, intubated, cardiac arrest EXAM: CT HEAD WITHOUT CONTRAST TECHNIQUE: Contiguous axial images were obtained from the base of the skull through the vertex without intravenous contrast. RADIATION DOSE REDUCTION: This exam was performed according to the departmental dose-optimization program which includes automated exposure control, adjustment of the mA and/or kV according to patient size and/or use of iterative reconstruction technique. COMPARISON:  None Available. FINDINGS: Brain: No evidence of acute infarct or hemorrhage. Preservation of  gray-white interface. Lateral ventricles and midline structures are unremarkable. No acute extra-axial fluid collections. No mass effect. Vascular: No hyperdense vessel or unexpected calcification. Skull: Normal. Negative for fracture or focal lesion. Sinuses/Orbits: Endotracheal tube and orogastric tube are partially visualized. Paranasal sinuses are clear. Other: None. IMPRESSION: 1. No acute intracranial process. Electronically Signed   By: Ozell Daring M.D.   On: 04/02/2023 15:09   DG Abdomen 1 View Result Date: 04/02/2023 CLINICAL DATA:  Post OG tube placement EXAM: ABDOMEN - 1 VIEW COMPARISON:  12/14/2014 FINDINGS: Enteric tube tip and side-port in the stomach. Defibrillator pads. Large stool burden in the right colon. IMPRESSION: Enteric tube tip and side-port in the stomach. Electronically Signed   By: Norman Gatlin M.D.   On: 04/02/2023 13:52   DG Chest Portable 1 View Result Date: 04/02/2023 CLINICAL DATA:  Status post intubation. EXAM: PORTABLE CHEST 1 VIEW COMPARISON:  Chest x-ray dated April 15, 2016. FINDINGS: Endotracheal tube tip at the carina. The heart size and mediastinal contours are within normal limits. Low lung volumes with streaky atelectasis in the right-greater-than-left upper lobes. No focal consolidation, pleural effusion, or pneumothorax. No acute osseous abnormality. Distended stomach. IMPRESSION: 1. Endotracheal tube tip at the carina. Recommend retraction. 2. No acute cardiopulmonary disease. Electronically Signed   By: Elsie ONEIDA Shoulder M.D.   On: 04/02/2023 13:17    Procedures Procedures    Medications Ordered in ED Medications  amiodarone  (NEXTERONE  PREMIX) 360-4.14 MG/200ML-% (1.8 mg/mL) IV infusion (60 mg/hr Intravenous New Bag/Given 04/02/23 1305)  amiodarone  (NEXTERONE  PREMIX) 360-4.14 MG/200ML-% (1.8 mg/mL) IV infusion (has no administration in time range)  propofol  (DIPRIVAN ) 1000 MG/100ML infusion (5 mcg/kg/min  85 kg Intravenous New Bag/Given 04/02/23 1311)   atorvastatin  (LIPITOR ) tablet 20 mg (has no administration in time range)  ezetimibe  (ZETIA ) tablet 10 mg (has no administration in time range)  docusate (COLACE) 50 MG/5ML liquid 100 mg (has no administration in time range)  polyethylene glycol (MIRALAX  / GLYCOLAX ) packet 17 g (has no administration in time range)  ondansetron  (ZOFRAN ) injection 4 mg (has no administration in time range)  acetaminophen  (TYLENOL ) tablet 650 mg (has no administration in time range)    Or  acetaminophen  (TYLENOL ) 160 MG/5ML solution 650 mg (has no administration in time range)    Or  acetaminophen  (TYLENOL ) suppository 650 mg (has no administration in time range)  acetaminophen  (TYLENOL ) tablet 650 mg (has no administration in time range)    Or  acetaminophen  (TYLENOL ) 160 MG/5ML solution 650 mg (has no administration in time range)    Or  acetaminophen  (TYLENOL ) suppository 650 mg (has no administration in time range)  busPIRone  (BUSPAR ) tablet 30 mg (has no administration in time range)    Or  busPIRone  (BUSPAR ) tablet 30 mg (has no administration in time range)  magnesium  sulfate IVPB 2 g 50 mL (has no administration in time range)  norepinephrine  (LEVOPHED ) 4mg  in (0.016 mg/mL) premix infusion (has no administration  in time range)  0.9 %  sodium chloride  infusion (has no administration in time range)  famotidine  (PEPCID ) tablet 20 mg (has no administration in time range)  fentaNYL  (SUBLIMAZE ) injection 50 mcg (has no administration in time range)  fentaNYL  in NS (10mcg/ml) infusion-PREMIX (has no administration in time range)  fentaNYL  (SUBLIMAZE ) bolus via infusion 50-100 mcg (has no administration in time range)  aspirin  chewable tablet 81 mg (has no administration in time range)  insulin  aspart (novoLOG ) injection 0-20 Units (has no administration in time range)  Ampicillin -Sulbactam (UNASYN ) 3 g in sodium chloride  0.9 % 100 mL IVPB (has no administration in time range)  Oral  care mouth rinse (has no administration in time range)  Oral care mouth rinse (has no administration in time range)  Chlorhexidine  Gluconate Cloth 2 % PADS 6 each (has no administration in time range)  lactated ringers  bolus 1,000 mL (1,000 mLs Intravenous New Bag/Given 04/02/23 1324)    ED Course/ Medical Decision Making/ A&Vickie                                 Medical Decision Making Problems Addressed: Cardiac arrest with ventricular fibrillation (HCC): acute illness or injury with systemic symptoms that poses a threat to life or bodily functions History of CAD (coronary artery disease): chronic illness or injury with exacerbation, progression, or side effects of treatment that poses a threat to life or bodily functions  Amount and/or Complexity of Data Reviewed Independent Historian: EMS    Details: hx External Data Reviewed: notes. Labs: ordered. Decision-making details documented in ED Course. Radiology: ordered and independent interpretation performed. Decision-making details documented in ED Course. ECG/medicine tests: ordered and independent interpretation performed. Decision-making details documented in ED Course. Discussion of management or test interpretation with external provider(s): Cardiology, critical care - discussed pt, to see in ED, CC to admit.   Risk Prescription drug management. Parenteral controlled substances. Decision regarding hospitalization.   Iv ns. Continuous pulse ox and cardiac monitoring. Labs ordered/sent. Imaging ordered.   Differential diagnosis includes vfib, acs, etc. Dispo decision including potential need for admission considered - will get labs and imaging and reassess.   Reviewed nursing notes and prior charts for additional history. External reports reviewed. Additional history from: EMS.   Cardiac monitor: sinus rhythm, rate 90.  Cardiology consulted - discussed pt with Dr Eather at 1300 including vfib arrest, rosc, intubated/vent, ecg,  hx, etc - he will see in consult.   Critical care consulted. Discussed pt, on way to see.   Labs reviewed/interpreted by me - wbc 11, hgb 13. K normal. Trop elev. Mg normal. Other labs pending.   IVF bolus.   Tube at 23 cm by EMS, pulled back 2 cm. Bil breath sounds, ventilating fine. Pcxr.   Xrays reviewed/interpreted by me - ETT tube near carina. Communicated with CC team, Vickie Little, need to pull back tube an additional 2 cm (or change out to larger ETT) - he is going to see/address now.   CT reviewed/interpreted by me - no hem or fx.   CRITICAL CARE RE: sudden collapse, Vfib arrest, cpr, rosc, r/o anoxic brain injury Performed by: Eloisa Chokshi E Cartrell Bentsen Total critical care time: 45 minutes Critical care time was exclusive of separately billable procedures and treating other patients. Critical care was necessary to treat or prevent imminent or life-threatening deterioration. Critical care was time spent personally by me on the following activities:  development of treatment plan with patient and/or surrogate as well as nursing, discussions with consultants, evaluation of patient's response to treatment, examination of patient, obtaining history from patient or surrogate, ordering and performing treatments and interventions, ordering and review of laboratory studies, ordering and review of radiographic studies, pulse oximetry and re-evaluation of patient's condition.           Final Clinical Impression(s) / ED Diagnoses Final diagnoses:  Cardiac arrest with ventricular fibrillation (HCC)  History of CAD (coronary artery disease)    Rx / DC Orders ED Discharge Orders     None         Bernard Drivers, MD 04/02/23 1544

## 2023-04-02 NOTE — Progress Notes (Signed)
 EEG complete - results pending

## 2023-04-02 NOTE — Progress Notes (Addendum)
 eLink Physician-Brief Progress Note Patient Name: JENNAE HAKEEM DOB: 11/25/1965 MRN: [id number]   Date of Service  04/02/2023  HPI/Events of Note  58 year old with history of coronary artery disease, hypertension, hyperlipidemia, obesity presenting with a witnessed cardiac arrest while singing in church. Initial rhythm was VF underwent defibrillation, amiodarone  and ACLS with ROSC at 15 minutes.   Left IJ CVC was placed and crosses into the right axillary vein.  eICU Interventions  Notified ground team that the line is malpositioned.  Day team aware of malpositioning and okay to use.   2241 - K 3.5 - Kcl ordered  Intervention Category Minor Interventions: Clinical assessment - ordering diagnostic tests  Deanda Ruddell 04/02/2023, 7:38 PM

## 2023-04-02 NOTE — Progress Notes (Signed)
 Post intubation ABG results obtained on ventilator settings of VT: 490, RR: 20, FIO2: 100%, and PEEP: 5.  Results given to MD.  Increased RR to 28 and decreased FIO2 to 60%.  Will continue to monitor.    Latest Reference Range & Units 04/02/23 14:15  Sample type  ARTERIAL  pH, Arterial 7.35 - 7.45  7.297 (L)  pCO2 arterial 32 - 48 mmHg 37.0  pO2, Arterial 83 - 108 mmHg 275 (H)  TCO2 22 - 32 mmol/L 20 (L)  Acid-base deficit 0.0 - 2.0 mmol/L 8.0 (H)  Bicarbonate 20.0 - 28.0 mmol/L 18.4 (L)  O2 Saturation % 100  Patient temperature  96.1 F  Collection site  RADIAL, ALLEN'S TEST ACCEPTABLE

## 2023-04-02 NOTE — Progress Notes (Signed)
 Was called by elink that CVC malpositioned headed toward right axilla from L internal jugular. I reviewed the films and measured. Appears it is about 8cm from the brachiocephalic region. I pulled the CVC back 5cm to the 15cm line and resutured this in place. Repeat film with better positioning. Okay to use.

## 2023-04-03 ENCOUNTER — Inpatient Hospital Stay (HOSPITAL_COMMUNITY): Payer: Managed Care, Other (non HMO)

## 2023-04-03 DIAGNOSIS — I469 Cardiac arrest, cause unspecified: Secondary | ICD-10-CM

## 2023-04-03 DIAGNOSIS — J9601 Acute respiratory failure with hypoxia: Secondary | ICD-10-CM | POA: Diagnosis not present

## 2023-04-03 DIAGNOSIS — U071 COVID-19: Secondary | ICD-10-CM

## 2023-04-03 DIAGNOSIS — R7402 Elevation of levels of lactic acid dehydrogenase (LDH): Secondary | ICD-10-CM | POA: Diagnosis not present

## 2023-04-03 DIAGNOSIS — R570 Cardiogenic shock: Secondary | ICD-10-CM | POA: Diagnosis not present

## 2023-04-03 DIAGNOSIS — I2510 Atherosclerotic heart disease of native coronary artery without angina pectoris: Secondary | ICD-10-CM | POA: Diagnosis not present

## 2023-04-03 LAB — POCT I-STAT 7, (LYTES, BLD GAS, ICA,H+H)
Acid-base deficit: 2 mmol/L (ref 0.0–2.0)
Acid-base deficit: 4 mmol/L — ABNORMAL HIGH (ref 0.0–2.0)
Bicarbonate: 19.7 mmol/L — ABNORMAL LOW (ref 20.0–28.0)
Bicarbonate: 18.8 mmol/L — ABNORMAL LOW (ref 20.0–28.0)
Calcium, Ion: 1.23 mmol/L (ref 1.15–1.40)
Calcium, Ion: 1.29 mmol/L (ref 1.15–1.40)
HCT: 35 % — ABNORMAL LOW (ref 36.0–46.0)
HCT: 36 % (ref 36.0–46.0)
Hemoglobin: 11.9 g/dL — ABNORMAL LOW (ref 12.0–15.0)
Hemoglobin: 12.2 g/dL (ref 12.0–15.0)
O2 Saturation: 100 %
O2 Saturation: 100 %
Patient temperature: 36.6
Patient temperature: 36.2
Potassium: 4 mmol/L (ref 3.5–5.1)
Potassium: 4 mmol/L (ref 3.5–5.1)
Sodium: 140 mmol/L (ref 135–145)
Sodium: 140 mmol/L (ref 135–145)
TCO2: 20 mmol/L — ABNORMAL LOW (ref 22–32)
TCO2: 20 mmol/L — ABNORMAL LOW (ref 22–32)
pCO2 arterial: 23.8 mm[Hg] — ABNORMAL LOW (ref 32–48)
pCO2 arterial: 27.6 mm[Hg] — ABNORMAL LOW (ref 32–48)
pH, Arterial: 7.524 — ABNORMAL HIGH (ref 7.35–7.45)
pH, Arterial: 7.44 (ref 7.35–7.45)
pO2, Arterial: 191 mm[Hg] — ABNORMAL HIGH (ref 83–108)
pO2, Arterial: 199 mm[Hg] — ABNORMAL HIGH (ref 83–108)

## 2023-04-03 LAB — HEPATIC FUNCTION PANEL
ALT: 205 U/L — ABNORMAL HIGH (ref 0–44)
AST: 160 U/L — ABNORMAL HIGH (ref 15–41)
Albumin: 2.9 g/dL — ABNORMAL LOW (ref 3.5–5.0)
Alkaline Phosphatase: 247 U/L — ABNORMAL HIGH (ref 38–126)
Bilirubin, Direct: 0.1 mg/dL (ref 0.0–0.2)
Indirect Bilirubin: 0.5 mg/dL (ref 0.3–0.9)
Total Bilirubin: 0.6 mg/dL (ref 0.0–1.2)
Total Protein: 5.5 g/dL — ABNORMAL LOW (ref 6.5–8.1)

## 2023-04-03 LAB — BASIC METABOLIC PANEL WITH GFR
Anion gap: 16 — ABNORMAL HIGH (ref 5–15)
BUN: 9 mg/dL (ref 6–20)
CO2: 18 mmol/L — ABNORMAL LOW (ref 22–32)
Calcium: 9.4 mg/dL (ref 8.9–10.3)
Chloride: 105 mmol/L (ref 98–111)
Creatinine, Ser: 0.66 mg/dL (ref 0.44–1.00)
GFR, Estimated: >60 mL/min
Glucose, Bld: 138 mg/dL — ABNORMAL HIGH (ref 70–99)
Potassium: 3.9 mmol/L (ref 3.5–5.1)
Sodium: 139 mmol/L (ref 135–145)

## 2023-04-03 LAB — RAPID URINE DRUG SCREEN, HOSP PERFORMED
Amphetamines: NOT DETECTED
Barbiturates: NOT DETECTED
Benzodiazepines: POSITIVE — AB
Cocaine: NOT DETECTED
Opiates: NOT DETECTED
Tetrahydrocannabinol: NOT DETECTED

## 2023-04-03 LAB — BASIC METABOLIC PANEL
Anion gap: 11 (ref 5–15)
Anion gap: 8 (ref 5–15)
BUN: 11 mg/dL (ref 6–20)
BUN: 8 mg/dL (ref 6–20)
CO2: 18 mmol/L — ABNORMAL LOW (ref 22–32)
CO2: 19 mmol/L — ABNORMAL LOW (ref 22–32)
Calcium: 9 mg/dL (ref 8.9–10.3)
Calcium: 8.7 mg/dL — ABNORMAL LOW (ref 8.9–10.3)
Chloride: 108 mmol/L (ref 98–111)
Chloride: 112 mmol/L — ABNORMAL HIGH (ref 98–111)
Creatinine, Ser: 0.75 mg/dL (ref 0.44–1.00)
Creatinine, Ser: 0.69 mg/dL (ref 0.44–1.00)
GFR, Estimated: >60 mL/min (ref >60)
GFR, Estimated: >60 mL/min (ref >60)
Glucose, Bld: 166 mg/dL — ABNORMAL HIGH (ref 70–99)
Glucose, Bld: 143 mg/dL — ABNORMAL HIGH (ref 70–99)
Potassium: 3.8 mmol/L (ref 3.5–5.1)
Potassium: 3.5 mmol/L (ref 3.5–5.1)
Sodium: 137 mmol/L (ref 135–145)
Sodium: 139 mmol/L (ref 135–145)

## 2023-04-03 LAB — ECHOCARDIOGRAM COMPLETE
AR max vel: 1.87 cm2
AV Peak grad: 6.4 mm[Hg]
Ao pk vel: 1.26 m/s
Area-P 1/2: 3.72 cm2
Height: 67 in
S' Lateral: 2.9 cm
Weight: 3121.71 [oz_av]

## 2023-04-03 LAB — GLUCOSE, CAPILLARY
Glucose-Capillary: 106 mg/dL — ABNORMAL HIGH (ref 70–99)
Glucose-Capillary: 126 mg/dL — ABNORMAL HIGH (ref 70–99)
Glucose-Capillary: 134 mg/dL — ABNORMAL HIGH (ref 70–99)
Glucose-Capillary: 135 mg/dL — ABNORMAL HIGH (ref 70–99)
Glucose-Capillary: 139 mg/dL — ABNORMAL HIGH (ref 70–99)
Glucose-Capillary: 142 mg/dL — ABNORMAL HIGH (ref 70–99)
Glucose-Capillary: 142 mg/dL — ABNORMAL HIGH (ref 70–99)
Glucose-Capillary: 151 mg/dL — ABNORMAL HIGH (ref 70–99)
Glucose-Capillary: 155 mg/dL — ABNORMAL HIGH (ref 70–99)
Glucose-Capillary: 157 mg/dL — ABNORMAL HIGH (ref 70–99)
Glucose-Capillary: 161 mg/dL — ABNORMAL HIGH (ref 70–99)
Glucose-Capillary: 162 mg/dL — ABNORMAL HIGH (ref 70–99)
Glucose-Capillary: 165 mg/dL — ABNORMAL HIGH (ref 70–99)
Glucose-Capillary: 173 mg/dL — ABNORMAL HIGH (ref 70–99)
Glucose-Capillary: 181 mg/dL — ABNORMAL HIGH (ref 70–99)
Glucose-Capillary: 196 mg/dL — ABNORMAL HIGH (ref 70–99)
Glucose-Capillary: 211 mg/dL — ABNORMAL HIGH (ref 70–99)

## 2023-04-03 LAB — HEPARIN LEVEL (UNFRACTIONATED)
Heparin Unfractionated: 0.73 [IU]/mL — ABNORMAL HIGH (ref 0.30–0.70)
Heparin Unfractionated: 0.71 [IU]/mL — ABNORMAL HIGH (ref 0.30–0.70)
Heparin Unfractionated: 0.83 [IU]/mL — ABNORMAL HIGH (ref 0.30–0.70)
Heparin Unfractionated: 0.62 [IU]/mL (ref 0.30–0.70)

## 2023-04-03 LAB — CBC
HCT: 35.2 % — ABNORMAL LOW (ref 36.0–46.0)
Hemoglobin: 11.6 g/dL — ABNORMAL LOW (ref 12.0–15.0)
MCH: 26.1 pg (ref 26.0–34.0)
MCHC: 33 g/dL (ref 30.0–36.0)
MCV: 79.1 fL — ABNORMAL LOW (ref 80.0–100.0)
Platelets: 229 10*3/uL (ref 150–400)
RBC: 4.45 MIL/uL (ref 3.87–5.11)
RDW: 13.9 % (ref 11.5–15.5)
WBC: 11.7 10*3/uL — ABNORMAL HIGH (ref 4.0–10.5)
nRBC: 0 % (ref 0.0–0.2)

## 2023-04-03 LAB — COOXEMETRY PANEL
Carboxyhemoglobin: 1.6 % — ABNORMAL HIGH (ref 0.5–1.5)
Methemoglobin: 0.7 % (ref 0.0–1.5)
O2 Saturation: 88.2 %
Total hemoglobin: 11.7 g/dL — ABNORMAL LOW (ref 12.0–16.0)

## 2023-04-03 LAB — TROPONIN I (HIGH SENSITIVITY): Troponin I (High Sensitivity): 9509 ng/L (ref <18)

## 2023-04-03 LAB — BETA-HYDROXYBUTYRIC ACID
Beta-Hydroxybutyric Acid: 0.15 mmol/L (ref 0.05–0.27)
Beta-Hydroxybutyric Acid: 0.09 mmol/L (ref 0.05–0.27)
Beta-Hydroxybutyric Acid: 0.08 mmol/L (ref 0.05–0.27)

## 2023-04-03 LAB — PROCALCITONIN: Procalcitonin: 1.45 ng/mL

## 2023-04-03 LAB — PHOSPHORUS: Phosphorus: 2.8 mg/dL (ref 2.5–4.6)

## 2023-04-03 LAB — TRIGLYCERIDES: Triglycerides: 56 mg/dL (ref <150)

## 2023-04-03 LAB — MAGNESIUM: Magnesium: 2.1 mg/dL (ref 1.7–2.4)

## 2023-04-03 MED ORDER — PROSOURCE TF20 ENFIT COMPATIBL EN LIQD
60.0000 mL | Freq: Every day | ENTERAL | Status: DC
  Administered 2023-04-03 – 2023-04-06 (×4): 60 mL
  Filled 2023-04-03 (×4): qty 60

## 2023-04-03 MED ORDER — LACTATED RINGERS IV SOLN: INTRAVENOUS | Status: DC

## 2023-04-03 MED ORDER — INSULIN ASPART 100 UNIT/ML IJ SOLN
0.0000 [IU] | INTRAMUSCULAR | Status: DC
  Administered 2023-04-03: 5 [IU] via SUBCUTANEOUS
  Administered 2023-04-03: 3 [IU] via SUBCUTANEOUS
  Administered 2023-04-04: 5 [IU] via SUBCUTANEOUS
  Administered 2023-04-04: 8 [IU] via SUBCUTANEOUS
  Administered 2023-04-04 (×3): 3 [IU] via SUBCUTANEOUS
  Administered 2023-04-05: 11 [IU] via SUBCUTANEOUS
  Administered 2023-04-05: 8 [IU] via SUBCUTANEOUS
  Administered 2023-04-05 (×2): 5 [IU] via SUBCUTANEOUS
  Administered 2023-04-05: 8 [IU] via SUBCUTANEOUS
  Administered 2023-04-05: 11 [IU] via SUBCUTANEOUS
  Administered 2023-04-05: 8 [IU] via SUBCUTANEOUS
  Administered 2023-04-06: 3 [IU] via SUBCUTANEOUS
  Administered 2023-04-06: 8 [IU] via SUBCUTANEOUS
  Administered 2023-04-06: 3 [IU] via SUBCUTANEOUS
  Administered 2023-04-06: 11 [IU] via SUBCUTANEOUS

## 2023-04-03 MED ORDER — VITAL HIGH PROTEIN PO LIQD
1000.0000 mL | ORAL | Status: DC
  Administered 2023-04-03: 1000 mL

## 2023-04-03 MED ORDER — ATORVASTATIN CALCIUM 80 MG PO TABS
80.0000 mg | ORAL_TABLET | Freq: Every day | ORAL | Status: DC
  Administered 2023-04-04 – 2023-04-18 (×14): 80 mg
  Filled 2023-04-03 (×14): qty 1

## 2023-04-03 MED ORDER — CLEVIDIPINE BUTYRATE 0.5 MG/ML IV EMUL
0.0000 mg/h | INTRAVENOUS | Status: DC
  Administered 2023-04-03: 4 mg/h via INTRAVENOUS
  Administered 2023-04-03: 2 mg/h via INTRAVENOUS
  Administered 2023-04-04 – 2023-04-06 (×6): 8 mg/h via INTRAVENOUS
  Administered 2023-04-06: 4 mg/h via INTRAVENOUS
  Filled 2023-04-03 (×9): qty 100

## 2023-04-03 MED ORDER — DEXTROSE 10 % IV SOLN: INTRAVENOUS | Status: DC

## 2023-04-03 MED ORDER — POTASSIUM CHLORIDE 20 MEQ PO PACK
40.0000 meq | PACK | Freq: Once | ORAL | Status: AC
  Administered 2023-04-03: 40 meq
  Filled 2023-04-03: qty 2

## 2023-04-03 MED ORDER — INSULIN ASPART 100 UNIT/ML IJ SOLN
2.0000 [IU] | INTRAMUSCULAR | Status: DC
  Administered 2023-04-04 – 2023-04-05 (×6): 2 [IU] via SUBCUTANEOUS

## 2023-04-03 NOTE — Progress Notes (Signed)
 eLink Physician-Brief Progress Note Patient Name: Vickie Little DOB: November 07, 1965 MRN: [id number]   Date of Service  04/03/2023  HPI/Events of Note  Bedside RN requesting advice to restart sedation for starting  TTM  eICU Interventions  Fentanyl  and Propofol  orders already in  Titrate for RASS -3 - (-5). Advised starting propofol  first followed by fentanyl  and gtt if needed.      Intervention Category Minor Interventions: Agitation / anxiety - evaluation and management  Khristopher Kapaun Genetta Kenning 04/03/2023, 8:42 PM

## 2023-04-03 NOTE — Progress Notes (Signed)
 PHARMACY - ANTICOAGULATION  Pharmacy Consult for Heparin  Indication: chest pain/ACS  Brief A/P: Heparin  level supratherapeutic Decrease Heparin  rate  No Known Allergies  Patient Measurements: Height: 5\' 7"  (170.2 cm) Weight: 85 kg (187 lb 6.3 oz) IBW/kg (Calculated) : 61.6 Heparin  Dosing Weight: 85 kg  Vital Signs: Temp: 98.8 F (37.1 C) (02/10 0128) Temp Source: Bladder (02/09 2000) BP: 112/62 (02/10 0128) Pulse Rate: 66 (02/10 0128)  Labs: Recent Labs    04/02/23 1249 04/02/23 1307 04/02/23 1415 04/02/23 1558 04/02/23 2152 04/02/23 2159 04/03/23 0031 04/03/23 0114  HGB 13.2 14.6 13.9  --   --  13.3  --   --   HCT 42.2 43.0 41.0  --   --  39.0  --   --   PLT 218  --   --   --   --   --   --   --   HEPARINUNFRC  --   --   --   --   --   --  0.73*  --   CREATININE 1.14* 0.80  --   --  1.16*  --   --  0.75  TROPONINIHS 223*  --   --  0,865*  --   --   --   --     Estimated Creatinine Clearance: 89.1 mL/min (by C-G formula based on SCr of 0.75 mg/dL).  Assessment: 58 y.o. female admitted with ACS s/p cardiac arrest for heparin   Goal of Therapy:  Heparin  level 0.3-0.7 units/ml Monitor platelets by anticoagulation protocol: Yes   Plan:  Decrease Heparin  950 units/hr Check heparin  level in 8 hours.  Claudine Cullens, PharmD, BCPS 04/03/2023,1:43 AM

## 2023-04-03 NOTE — Progress Notes (Signed)
   04/03/23 1400  Spiritual Encounters  Type of Visit Initial  Conversation partners present during encounter Nurse  Referral source Nurse (RN/NT/LPN)  Reason for visit Advance directives   Chaplain responded to submitted Spiritual Consult to for Advance Care Directive.  Upon arriving at the room, chaplain discovered that Pt is intubated and sedated and therefore incapable of receiving ACD education and cannot sign one at this time either.  If Pt's condition improves and they would like to receive ACD education and paperwork, a new spiritual consult should be submitted.   Chaplain services remain available by Spiritual Consult or for emergent cases, paging [phone]  Chaplain Dewitte Foreman, MDiv [email or web address] [phone]

## 2023-04-03 NOTE — Inpatient Diabetes Management (Signed)
 Inpatient Diabetes Program Recommendations  AACE/ADA: New Consensus Statement on Inpatient Glycemic Control (2015)  Target Ranges:  Prepandial:   less than 140 mg/dL      Peak postprandial:   less than 180 mg/dL (1-2 hours)      Critically ill patients:  140 - 180 mg/dL   Lab Results  Component Value Date   GLUCAP 151 (H) 04/03/2023   HGBA1C 9.2 (H) 04/02/2023    Review of Glycemic Control  Latest Reference Range & Units 04/03/23 04:15 04/03/23 05:50 04/03/23 08:02  Glucose-Capillary 70 - 99 mg/dL 914 (H) 782 (H) 956 (H)    Latest Reference Range & Units 04/03/23 05:44  Beta-Hydroxybutyric Acid 0.05 - 0.27 mmol/L 0.09   Diabetes history: DM  Outpatient Diabetes medications:  Farxiga 10 mg daily Amaryl 2 mg daily Metformin 1000 mg bid Ozempic 2 mg weekly Current orders for Inpatient glycemic control:  IV insulin -DKA orders  Inpatient Diabetes Program Recommendations:    Blood sugars improved with IV insulin . Beta-Hydroxybutyric acid is normal.  Consider transition off IV insulin  using Phase 3 of ICU glycemic control orders.   Thanks,  Josefa Ni, RN, BC-ADM Inpatient Diabetes Coordinator Pager [phone]  (8a-5p)

## 2023-04-03 NOTE — Progress Notes (Signed)
 NAME:  Vickie Little, MRN:  [id number], DOB:  Mar 02, 1965, LOS: 1 ADMISSION DATE:  04/02/2023, CONSULTATION DATE:  04/02/2023 REFERRING MD:  [facility] - EDP, CHIEF COMPLAINT: Cardiac arrest    History of Present Illness:  58 year old female patient with history of metabolic syndrome, and coronary artery disease followed by cardiology last seen in January 2025.  Was at church singing in choir, had a witnessed collapse.  EMS was called on fire arrival the patient was agonal with weak pulses. On EMS arrival the patient was pulseless CPR was initiated, initial rhythm VF, ACLS interventions included epinephrine  x 2, defibrillation x 5, amiodarone  300 mg.  An IO was placed in the right humerus she was intubated return of spontaneous circulation estimated at 15 minutes On arrival to [facility] [place] [facility] initial EKG negative for ST elevation white blood cell count 11.7 glucose 408 post CPR lactic acid 10 Cardiology consulted by EDP Critical care asked to admit.  Pertinent Medical History:  Coronary artery disease, with prior angina seen by cardiology last in January 2025 type 2 diabetes with retinopathy, last A1c of record 9.5, hypertension, hyperlipidemia Remote herniated disc Obesity  Significant Hospital Events: Including procedures, antibiotic start and stop dates in addition to other pertinent events   2/9 Admitted status post VF arrest time to return of spontaneous circulation estimated at 15 minutes hemodynamically stable on arrival to the ER decorticate posturing  Interim History / Subjective:  No significant events overnight ETT exchanged yesterday evening without incident WUA not completed this AM per RN Remains on Prop 40, Fent 100 NE Husband at bedside  Objective:  Blood pressure 112/62, pulse (!) 56, temperature (!) 97 F (36.1 C), resp. rate 18, height 5\' 7"  (1.702 m), weight 88.5 kg, last menstrual period 03/25/2016, SpO2 100%. CVP:  [6 mmHg-11 mmHg] 6 mmHg  Vent Mode: PRVC FiO2  (%):  [40 %-100 %] 40 % Set Rate:  [18 bmp-28 bmp] 18 bmp Vt Set:  [490 mL] 490 mL PEEP:  [5 cmH20] 5 cmH20 Plateau Pressure:  [15 cmH20-18 cmH20] 15 cmH20   Intake/Output Summary (Last 24 hours) at 04/03/2023 0721 Last data filed at 04/03/2023 0700 Gross per 24 hour  Intake 3733.35 ml  Output 1985 ml  Net 1748.35 ml   Filed Weights   04/02/23 1300 04/03/23 0500  Weight: 85 kg 88.5 kg   Physical Examination: General: Acutely ill-appearing middle-aged woman in NAD. Intubated, sedated. HEENT: [place]/AT, anicteric sclera, PERRL 3mm, moist mucous membranes. Neuro:  Intubated, sedated.  Does not respond to verbal, tactile or noxious stimuli. Does not withdraw to pain.Not following commands. No spontaneous movement of extremities noted. No cough/gag, ?weak R corneal, no L corneal.  CV: Mildly bradycardic, regular rhythm, no m/g/r. PULM: Breathing even and unlabored on vent (PEEP 5, FiO2 40%). Lung fields CTAB. GI: Soft, nontender, nondistended. Normoactive bowel sounds. Extremities: No significant LE edema noted. Skin: Warm/dry, no rashes.  Resolved Hospital Problem List:    Assessment & Plan:  Status post VF arrest with known history of coronary artery disease - Remain in ICU - Targeted temperature management/normothermia protocol - Goal MAP > 65 - Fluid resuscitation as tolerated - Levophed  titrated to goal MAP - Heparin  gtt x 48H - Continue amiodarone  - Continue ASA/statin - F/u Echo (scheduled 2/10) - Cardiology following, appreciate recommendations - Ischemia evaluation pending clinical trajectory, main focus at this point is neurologic status/recovery  Lactic acidosis Status post cardiac arrest - Fluid resuscitation as tolerated - Trend LA to normal  Acute respiratory failure following cardiopulmonary arrest COVID-19 positive Intubated in the field, at risk for aspiration. ETT exchange completed 2/9PM. - Continue full vent support (4-8cc/kg IBW) - Wean FiO2 for O2 sat >  90% - Daily WUA/SBT once appropriate from a neurologic/mental status standpoint - VAP bundle - Pulmonary hygiene - PAD protocol for sedation: Propofol  and Fentanyl  for goal RASS 0 to -1 - Empiric Unasyn  for ?aspiration coverage - Follow Resp Cx, prelim few GPCs 2/10  Acute metabolic encephalopathy status postcardiac arrest concerning for anoxic brain injury CT Head 2/9 NAICA. - Awaiting 48-72H mark post-arrest prior to any neuroprognostication - Repeat head imaging/MRI Brain after 72H pending neuro exam - PAD protocol in place - Neuroprotective measures: HOB > 30 degrees, normoglycemia, normothermia, electrolytes WNL  Diabetes with hyperglycemia - Insulin  gtt per EndoTool - CBGs Q1H while on gtt - Transition to SQ insulin  per protocol, at that point SSI - Goal CBG 140-180  Best Practice (right click and "Reselect all SmartList Selections" daily)   Diet/type: NPO DVT prophylaxis systemic heparin  Pressure ulcer(s): N/A GI prophylaxis: H2B Lines: N/A Foley:  Yes, and it is still needed Code Status:  full code Last date of multidisciplinary goals of care discussion [2/10 - Husband at bedside, confirms full code status]  Critical care time:    The patient is critically ill with multiple organ system failure and requires high complexity decision making for assessment and support, frequent evaluation and titration of therapies, advanced monitoring, review of radiographic studies and interpretation of complex data.   Critical Care Time devoted to patient care services, exclusive of separately billable procedures, described in this note is 39 minutes.  Star East, PA-C [facility] 04/03/23 7:21 AM  Please see [email or web address] for pager details.  From 7A-7P if no response, please call [phone] After hours, please call ELink [phone]

## 2023-04-03 NOTE — Progress Notes (Signed)
 Echocardiogram 2D Echocardiogram has been performed.  Vickie Little 04/03/2023, 10:49 AM

## 2023-04-03 NOTE — Consult Note (Addendum)
 Advanced Heart Failure Team Consult Note   Primary Physician: Jacqulyne Maxim, MD Cardiologist:  Olinda Bertrand, DO  Reason for Consultation: VF Arrest  HPI:    Vickie Little is seen today for evaluation of VF arrest at the request of Dr. Fulton Job.   She is a 58 year old with past medical history of NSTEMI in 2013 s/p PCI to LAD, CAD, hypertension, hyperlipidemia, type 2 diabetes, PAD, and neurogenic lumbar claudication, and obesity.   Established with Dr. Albert Huff for CAD management on 02/24/23, last seen in 12/23 by [facility]. At the time no s/s of angina or CHF. She was continued on aspirin , atorva, and started on zetia . Echo was ordered.  Patient presented after witnessed VF arrest while singing in church. On EMS arrival, patient was agonally breathing without pulse, no bystander CPR initiated. She required ~ 15 min ACLS and Defib x5 . She arrived intubated with BP 115/74, HR 114 and SpO2 100%. Labs on arrival notable for K 3.9, BG 399, Cr 1.14, Anion gap 20, AST/ALT 288/302, ALP 263, hs-trop 295>1884, LA 10>7.0, COVID-19 positive, and ABG on vent 7.30/37/275/20/8/18/100. Found to be in DKA with BHA 0.68 and A1C 9.2. EKG SR 96 bpm with ST depression in V2-V4. CXR on admission showed R mainstem intubation, ETT retracted. CT head and neck normal. She was started on IV heparin  and amiodarone .  Admitted to ICU by critical care. ETT exchanged and central line placed. Advance Heart Failure consulted.   Echo ordered.  On NE 4, Amio 30/hr, Prop/Fent, Insulin /D5, and Heparin  drip.  Intubated and sedated. Brother at bedside.   Home Medications Prior to Admission medications   Medication Sig Start Date End Date Taking? Authorizing Provider  aspirin  EC 81 MG tablet Take 81 mg by mouth daily.    [provider]  atorvastatin  (LIPITOR ) 20 MG tablet TAKE 1 TABLET(20 MG) BY MOUTH DAILY 06/24/19   Knox Perl, MD  cetirizine  (ZYRTEC ) 10 MG tablet Take 1 tablet (10 mg total) by  mouth daily. Patient not taking: Reported on 02/24/2023 04/25/22   Dodson Freestone, FNP  cholecalciferol (VITAMIN D3) 25 MCG (1000 UNIT) tablet Vitamin D     [provider]  cilostazol  (PLETAL ) 50 MG tablet TAKE 1 TABLET(50 MG) BY MOUTH TWICE DAILY Patient not taking: Reported on 02/24/2023 08/20/21   Cantwell, Larwence Poet C, PA-C  clotrimazole-betamethasone (LOTRISONE) cream Apply 1 Application topically 2 (two) times daily as needed. 12/13/22   [provider]  dapagliflozin propanediol (FARXIGA) 10 MG TABS tablet Take 10 mg by mouth daily. 12/27/19   [provider]  ezetimibe  (ZETIA ) 10 MG tablet Take 1 tablet (10 mg total) by mouth daily. 02/24/23   Tolia, Sunit, DO  fluticasone  (FLONASE ) 50 MCG/ACT nasal spray Place 1 spray into both nostrils daily. Patient not taking: Reported on 02/24/2023 03/17/22   Vernestine Gondola, PA-C  glimepiride (AMARYL) 2 MG tablet Take 2 mg by mouth daily with breakfast. 04/07/22   [provider]  metFORMIN (GLUCOPHAGE) 500 MG tablet Take 1,000 mg by mouth 2 (two) times daily with a meal.     [provider]  metoprolol  succinate (TOPROL -XL) 25 MG 24 hr tablet TAKE 1 TABLET(25 MG) BY MOUTH DAILY Patient not taking: Reported on 02/24/2023 03/29/21   Mercy Stall C, PA-C  Multiple Vitamin (MULTIVITAMIN WITH MINERALS) TABS tablet Take 1 tablet by mouth daily. Hasn't had in a week    [provider]  OZEMPIC, 2 MG/DOSE, 8 MG/3ML SOPN Inject 0.75  mLs into the skin once a week.    [provider]  Phenylephrine -DM-GG (TUSSIN CF PO) Take by mouth. Patient not taking: Reported on 02/24/2023    [provider]  Semaglutide,0.25 or 0.5MG /DOS, (OZEMPIC, 0.25 OR 0.5 MG/DOSE,) 2 MG/1.5ML SOPN Inject 0.5 mg into the skin once a week.    [provider]  TRULICITY 4.5 MG/0.5ML SOPN Inject into the skin. Patient not taking: Reported on 02/24/2023 06/14/20   [provider]   Past Medical History: Past Medical  History:  Diagnosis Date   Arthritis    CAD (coronary artery disease), native coronary artery 07/30/2018   Diabetes mellitus without complication (HCC)    type II    Heart attack (HCC)    Hypertension    NSTEMI (non-ST elevated myocardial infarction) (HCC) 02/08/2012   Past Surgical History: Past Surgical History:  Procedure Laterality Date   BREAST BIOPSY Right 01/10/2023   MM RT BREAST BX W LOC DEV 1ST LESION IMAGE BX SPEC STEREO GUIDE 01/10/2023 [facility] MAMMOGRAPHY   CORONARY ANGIOPLASTY WITH STENT PLACEMENT     LEFT HEART CATHETERIZATION WITH CORONARY ANGIOGRAM N/A 02/13/2012   Procedure: LEFT HEART CATHETERIZATION WITH CORONARY ANGIOGRAM;  Surgeon: Dorothye Gathers, MD;  Location: [facility] CATH LAB;  Service: Cardiovascular;  Laterality: N/A;   LUMBAR LAMINECTOMY/DECOMPRESSION MICRODISCECTOMY Left 04/20/2016   Procedure: 1. central decompression for spinal stenosis   1.central decompression for spinal stenosis L5-S1  2.foramionotomy L5 root and S1nerve root left       3. Microdisectomy L5-S1 left  ;  Surgeon: Hazle Lites, MD;  Location: [facility] ORS;  Service: Orthopedics;  Laterality: Left;   Family History: Family History  Adopted: Yes  Problem Relation Age of Onset   Diabetes Father    Social History: Social History   Socioeconomic History   Marital status: Married    Spouse name: Not on file   Number of children: 1   Years of education: Not on file   Highest education level: Not on file  Occupational History   Occupation: Magazine features editor: [employer]    Comment: [employer]  Tobacco Use   Smoking status: Former    Current packs/day: 0.00    Types: Cigarettes    Quit date: 08/22/1995    Years since quitting: 27.6   Smokeless tobacco: Never  Vaping Use   Vaping status: Former  Substance and Sexual Activity   Alcohol use: Yes    Comment: socially. 2-3 beers on weekend.   Drug use: No   Sexual activity: Yes  Other Topics Concern   Not on file   Social History Narrative   Not on file   Social Drivers of Health   Financial Resource Strain: Not on file  Food Insecurity: Low Risk  (10/26/2022)   Received from [facility]   Hunger Vital Sign    Worried About Running Out of Food in the Last Year: Never true    Ran Out of Food in the Last Year: Never true  Transportation Needs: No Transportation Needs (10/26/2022)   Received from [facility]    In the past 12 months, has lack of reliable transportation kept you from medical appointments, meetings, work or from getting things needed for daily living? : No  Physical Activity: Not on file  Stress: Not on file  Social Connections: Not on file   Allergies:  No Known Allergies  Objective:    Vital Signs:   Temp:  [94.4  F (34.7 C)-99.1 F (37.3 C)] 96.8 F (36 C) (02/10 0830) Pulse Rate:  [55-114] 59 (02/10 0830) Resp:  [0-31] 9 (02/10 0830) BP: (91-142)/(60-102) 101/63 (02/10 0830) SpO2:  [99 %-100 %] 100 % (02/10 0830) FiO2 (%):  [40 %-100 %] 40 % (02/10 0800) Weight:  [85 kg-88.5 kg] 88.5 kg (02/10 0500) Last BM Date :  (PTA)  Weight change: Filed Weights   04/02/23 1300 04/03/23 0500  Weight: 85 kg 88.5 kg   Intake/Output:   Intake/Output Summary (Last 24 hours) at 04/03/2023 7829 Last data filed at 04/03/2023 0800 Gross per 24 hour  Intake 3931.69 ml  Output 2180 ml  Net 1751.69 ml    Physical Exam    CVP 7-8 General: Sedated/intubated Cardiac: JVP not elevated. S1 and S2 present. No murmurs or rub. Resp: Lung sounds clear and equal B/L Abdomen: Soft, non-tender, non-distended Extremities: Warm and dry. No peripheral edema.  Neuro: Sedated/Intubated. PERRAL.  Lines/Devices:  LIJ CVL, ETT, Foley, R rad Aline, OGT  Telemetry   SB in 50s, occasional PVCs (personally reviewed)  EKG    EKG personally reviewed above  Labs   Basic Metabolic Panel: Recent Labs  Lab 04/02/23 1249 04/02/23 1307 04/02/23 1415 04/02/23 2152  04/02/23 2159 04/03/23 0114 04/03/23 0323 04/03/23 0544 04/03/23 0552  NA 136 136   < > 139 139 137 140 139 140  K 3.9 3.9   < > 3.6 3.5 3.8 4.0 3.9 4.0  CL 103 105  --  106  --  108  --  105  --   CO2 13*  --   --  16*  --  18*  --  18*  --   GLUCOSE 399* 408*  --  276*  --  166*  --  138*  --   BUN 11 11  --  12  --  11  --  9  --   CREATININE 1.14* 0.80  --  1.16*  --  0.75  --  0.66  --   CALCIUM  8.9  --   --  8.9  --  9.0  --  9.4  --   MG 1.9  --   --   --   --   --   --  2.1  --   PHOS  --   --   --   --   --   --   --  2.8  --    < > = values in this interval not displayed.   Liver Function Tests: Recent Labs  Lab 04/02/23 1249 04/03/23 0544  AST 288* 160*  ALT 302* 205*  ALKPHOS 263* 247*  BILITOT 1.1 0.6  PROT 6.5 5.5*  ALBUMIN  3.6 2.9*   No results for input(s): "LIPASE", "AMYLASE" in the last 168 hours. No results for input(s): "AMMONIA" in the last 168 hours.  CBC: Recent Labs  Lab 04/02/23 1249 04/02/23 1307 04/02/23 1415 04/02/23 2159 04/03/23 0323 04/03/23 0544 04/03/23 0552  WBC 11.7*  --   --   --   --  11.7*  --   HGB 13.2   < > 13.9 13.3 11.9* 11.6* 12.2  HCT 42.2   < > 41.0 39.0 35.0* 35.2* 36.0  MCV 84.6  --   --   --   --  79.1*  --   PLT 218  --   --   --   --  229  --    < > = values in  this interval not displayed.   Cardiac Enzymes: No results for input(s): "CKTOTAL", "CKMB", "CKMBINDEX", "TROPONINI" in the last 168 hours.  BNP: BNP (last 3 results) No results for input(s): "BNP" in the last 8760 hours.  ProBNP (last 3 results) No results for input(s): "PROBNP" in the last 8760 hours.  CBG: Recent Labs  Lab 04/03/23 0219 04/03/23 0321 04/03/23 0415 04/03/23 0550 04/03/23 0802  GLUCAP 157* 161* 162* 134* 151*   Coagulation Studies: No results for input(s): "LABPROT", "INR" in the last 72 hours.  Imaging   DG Chest Port 1 View Result Date: 04/03/2023 CLINICAL DATA:  58 year old female with history of respiratory  failure. EXAM: PORTABLE CHEST 1 VIEW COMPARISON:  Chest x-ray 04/02/2023. FINDINGS: An endotracheal tube is in place with tip 0.5 cm above the carina. Left internal jugular central venous catheter with tip crossing the midline directed toward the right subclavian region. A nasogastric tube is seen extending into the stomach, however, the tip of the nasogastric tube extends below the lower margin of the image. Defibrillator pads projecting over the lower left hemithorax and epigastric region. Lung volumes are low. No consolidative airspace disease. No pleural effusions. No pneumothorax. No pulmonary nodule or mass noted. Pulmonary vasculature and the cardiomediastinal silhouette are within normal limits. IMPRESSION: 1. Support apparatus, as above. The left internal jugular central venous catheter crosses the midline directed toward the right subclavian region. Repositioning is recommended. 2. Low lung volumes without radiographic evidence of acute cardiopulmonary disease. Electronically Signed   By: Alexandria Angel M.D.   On: 04/03/2023 06:33   DG CHEST PORT 1 VIEW Result Date: 04/02/2023 CLINICAL DATA:  Central line placement. EXAM: PORTABLE CHEST 1 VIEW COMPARISON:  Chest radiograph dated 04/02/2023. FINDINGS: Left IJ central venous line has been retracted. The tip is again under the right clavicle, likely in the right subclavian vein. Recommend retraction by 6 cm and repositioning. Endotracheal tube remains above the carina. Enteric tube extends below the diaphragm with tip beyond the inferior margin of the image. No interval change in bilateral streaky pulmonary densities. No pleural effusion or pneumothorax. Stable cardiac silhouette. Coronary vascular stent. No acute osseous pathology. IMPRESSION: 1. Left IJ central venous line with tip in the region of the right subclavian vein. Recommend retraction by 6 cm and repositioning. 2. No interval change in bilateral streaky pulmonary densities. These results will  be called to the ordering clinician or representative by the Radiologist Assistant, and communication documented in the PACS or [employer]. Electronically Signed   By: Angus Bark M.D.   On: 04/02/2023 21:05   DG Chest Port 1 View Result Date: 04/02/2023 CLINICAL DATA:  Central line placement EXAM: PORTABLE CHEST 1 VIEW COMPARISON:  04/02/2023, 4:44 p.m. FINDINGS: Interval placement of left neck vascular catheter, which is malpositioned, tip crossing the mediastinum and in the vicinity of the right axillary vessels. Endotracheal tube has been retracted to an appropriate midtracheal position. Esophagogastric tube with tip and side port below the diaphragm. Cardiomegaly with left coronary artery calcifications and or stents. No acute abnormality of the lungs. No acute osseous findings. IMPRESSION: 1. Interval placement of left neck vascular catheter, which is malpositioned, tip crossing the mediastinum and in the vicinity of the right axillary vessels. 2. Endotracheal tube has been retracted to an appropriate midtracheal position. 3. Esophagogastric tube with tip and side port below the diaphragm. 4. Cardiomegaly. These results will be called to the ordering clinician or representative by the Radiologist Assistant, and communication documented in the PACS  or [employer]. Electronically Signed   By: Fredricka Jenny M.D.   On: 04/02/2023 19:16   DG Chest Port 1 View Result Date: 04/02/2023 CLINICAL DATA:  Intubated, cardiac arrest EXAM: PORTABLE CHEST 1 VIEW COMPARISON:  04/02/2023 FINDINGS: Single frontal view of the chest demonstrates endotracheal tube overlying tracheal air column, tip overlying the proximal right mainstem bronchus. Recommend retracting 2.5 cm. Enteric catheter passes below diaphragm tip excluded by collimation. Cardiac silhouette is stable. No acute airspace disease, effusion, or pneumothorax. No acute bony abnormalities. IMPRESSION: 1. Right mainstem intubation, recommend  retracting endotracheal tube 2.5 cm. 2. No acute airspace disease. These results will be called to the ordering clinician or representative by the Radiologist Assistant, and communication documented in the PACS or [employer]. Electronically Signed   By: Bobbye Burrow M.D.   On: 04/02/2023 16:50   EEG adult Result Date: 04/02/2023 Augustin Leber, MD     04/02/2023  4:05 PM History: 58 yo F s/p cardiac arrest Sedation: Propofol  40 mcg/kg/min Patient State: Comatose Technique: This EEG was acquired with electrodes placed according to the International 10-20 electrode system (including Fp1, Fp2, F3, F4, C3, C4, P3, P4, O1, O2, T3, T4, T5, T6, A1, A2, Fz, Cz, Pz). The following electrodes were missing or displaced: none. Background: The background is diffusely attenuated, with no definite cerebral activity seen. Photic stimulation: Physiologic driving is not performed EEG Abnormalities: 1) diffusely attenuated EEG Clinical Interpretation: This EEG demonstrates evidence of a profound cerebral dysfunction as can be seen with severe hypoxic brain injury, medication induced coma, and diffuse structural injury, among other causes. There was no seizure or seizure predisposition recorded on this study. Please note that lack of epileptiform activity on EEG does not preclude the possibility of epilepsy. Ann Keto, MD [facility] [phone] If 7pm- 7am, please page neurology on call as listed in AMION.  CT Cervical Spine Wo Contrast Result Date: 04/02/2023 CLINICAL DATA:  Cardiac arrest, intubated, neck trauma EXAM: CT CERVICAL SPINE WITHOUT CONTRAST TECHNIQUE: Multidetector CT imaging of the cervical spine was performed without intravenous contrast. Multiplanar CT image reconstructions were also generated. RADIATION DOSE REDUCTION: This exam was performed according to the departmental dose-optimization program which includes automated exposure control, adjustment of the mA and/or kV  according to patient size and/or use of iterative reconstruction technique. COMPARISON:  None Available. FINDINGS: Alignment: Alignment is anatomic. Skull base and vertebrae: No acute fracture. No primary bone lesion or focal pathologic process. Soft tissues and spinal canal: No prevertebral fluid or swelling. No visible canal hematoma. Disc levels:  No significant spondylosis or facet hypertrophy. Upper chest: Dependent consolidation is seen within the left upper lobe, which may be hypoventilatory given intubation. Endotracheal tube and enteric catheter are partially visualized. Other: Reconstructed images demonstrate no additional findings. IMPRESSION: 1. No acute cervical spine fracture. 2. Dependent left upper lobe consolidation, which may reflect atelectasis given intubation. Electronically Signed   By: Bobbye Burrow M.D.   On: 04/02/2023 15:14   CT HEAD WO CONTRAST ( ) Result Date: 04/02/2023 CLINICAL DATA:  Anoxic brain damage, intubated, cardiac arrest EXAM: CT HEAD WITHOUT CONTRAST TECHNIQUE: Contiguous axial images were obtained from the base of the skull through the vertex without intravenous contrast. RADIATION DOSE REDUCTION: This exam was performed according to the departmental dose-optimization program which includes automated exposure control, adjustment of the mA and/or kV according to patient size and/or use of iterative reconstruction technique. COMPARISON:  None Available. FINDINGS: Brain: No evidence of acute infarct or hemorrhage.  Preservation of gray-white interface. Lateral ventricles and midline structures are unremarkable. No acute extra-axial fluid collections. No mass effect. Vascular: No hyperdense vessel or unexpected calcification. Skull: Normal. Negative for fracture or focal lesion. Sinuses/Orbits: Endotracheal tube and orogastric tube are partially visualized. Paranasal sinuses are clear. Other: None. IMPRESSION: 1. No acute intracranial process. Electronically Signed   By:  Bobbye Burrow M.D.   On: 04/02/2023 15:09   DG Abdomen 1 View Result Date: 04/02/2023 CLINICAL DATA:  Post OG tube placement EXAM: ABDOMEN - 1 VIEW COMPARISON:  12/14/2014 FINDINGS: Enteric tube tip and side-port in the stomach. Defibrillator pads. Large stool burden in the right colon. IMPRESSION: Enteric tube tip and side-port in the stomach. Electronically Signed   By: Rozell Cornet M.D.   On: 04/02/2023 13:52   DG Chest Portable 1 View Result Date: 04/02/2023 CLINICAL DATA:  Status post intubation. EXAM: PORTABLE CHEST 1 VIEW COMPARISON:  Chest x-ray dated April 15, 2016. FINDINGS: Endotracheal tube tip at the carina. The heart size and mediastinal contours are within normal limits. Low lung volumes with streaky atelectasis in the right-greater-than-left upper lobes. No focal consolidation, pleural effusion, or pneumothorax. No acute osseous abnormality. Distended stomach. IMPRESSION: 1. Endotracheal tube tip at the carina. Recommend retraction. 2. No acute cardiopulmonary disease. Electronically Signed   By: Aleta Anda M.D.   On: 04/02/2023 13:17   Medications:    Current Medications:  acetaminophen   650 mg Oral Q4H   Or   acetaminophen  (TYLENOL ) oral liquid 160 mg/5 mL  650 mg Per Tube Q4H   Or   acetaminophen   650 mg Rectal Q4H   aspirin   81 mg Per Tube Daily   atorvastatin   20 mg Per Tube Daily   Chlorhexidine  Gluconate Cloth  6 each Topical Daily   docusate  100 mg Per Tube BID   ezetimibe   10 mg Per Tube Daily   famotidine   20 mg Per Tube BID   fentaNYL  (SUBLIMAZE ) injection  50 mcg Intravenous Once   mouth rinse  15 mL Mouth Rinse Q2H   polyethylene glycol  17 g Per Tube Daily    Infusions:  sodium chloride  Stopped (04/02/23 1559)   sodium chloride      amiodarone  30 mg/hr (04/03/23 0800)   ampicillin -sulbactam (UNASYN ) IV 3 g (04/03/23 0835)   dextrose  5% lactated ringers  125 mL/hr at 04/03/23 0800   fentaNYL  infusion INTRAVENOUS 100 mcg/hr (04/03/23 0800)    heparin  950 Units/hr (04/03/23 0800)   insulin  1.9 Units/hr (04/03/23 0800)   lactated ringers  Stopped (04/02/23 2024)   magnesium  sulfate Stopped (04/02/23 2242)   norepinephrine  (LEVOPHED ) Adult infusion 4 mcg/min (04/03/23 0800)   propofol  (DIPRIVAN ) infusion 40 mcg/kg/min (04/03/23 0800)    Patient Profile   Vickie Little is a 58 year old with past medical history of NSTEMI in 2013 s/p PCI to LAD, CAD, hypertension, hyperlipidemia, type 2 diabetes, PAD, and neurogenic lumbar claudication, and obesity.   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  Repeat echo pending. - may need RHC, once neuro status is determined - CVP 7-8. Continue to monitor with high IV fluid intake.  - Coox pending  CAD H/o NSTEMI s/p PCI to LAD in 2013 - LHC/PCI (12/13): midLAD overlapping DES x2, diagoonal with 95% stenosis, mild Cx and RCA disease - hs trop 409>8119 - continue heparin  drip - continue asa + atorva 20 mg (low dose d/t h/o elevated ALP) - continue zetia  10 mg daily - will need LHC once neuro status is determined  Liver shock - in the setting of VF arrest - slight improvement - CTM   HTN - hypotensive requiring NE while sedated  DKA Type 2 Diabetes - on max dose Ozempic, Farxiga, Metformin, and Glimepiride in OP  - history of b/l retinopathy - A1C 10.6>9.5 - BHA 0.68. CO2 19, anion gap 16 today - continue insulin  and D5 drip per CCM for DKA - BG 140-150s  COVID + - positive on  admission  PAD - ABIs in 11/20 with R 0.81 and L 0.69  Length of Stay: 1   CRITICAL CARE Performed by: [place] Lee  Total critical care time: 28 minutes  Critical care time was exclusive of separately billable procedures and treating other patients.  Critical care was necessary to treat or prevent imminent or life-threatening deterioration.  Critical care was time spent personally by me on the following activities: development of treatment plan with patient and/or surrogate as well as nursing, discussions with consultants, evaluation of patient's response to treatment, examination of patient, obtaining history from patient or surrogate, ordering and performing treatments and interventions, ordering and review of laboratory studies, ordering and review of radiographic studies, pulse oximetry and re-evaluation of patient's condition.  [place] Lee, NP  04/03/2023, 9:22 AM  Advanced Heart Failure Team Pager [phone] (M-F; 7a - 5p)  Please contact [facility] for night-coverage after hours (4p -7a ) and weekends on [email or web address]  Patient seen with NP, I formulated plan and agree with the above note.   Patient had witnessed VF arrest while singing in church. She was down without CPR for an uncertain period of time before EMS arrived.  She then had CPR x 15 minutes with shocks x 4 and ROSC. Per family, no chest pain prior to event but she did have a cough and suspected she had COVID.  She was found to be COVID+.  HS-TnI eelvated to 7944. She was noted to be in DKA with elevated beta hydroxybutyrate as well as initial lactate 10.   Patient has history of CAD with PCI to LAD in 2013.  ECG this admission with nonspecific ST-T changes.   Currently intubated/sedated on NE 4, amiodarone  30, heparin  gtt. CVP 8.  Co-ox just sent.   General: Sedated on vent.  Neck: JVP 8 cm, no thyromegaly or thyroid  nodule.  Lungs: Clear to auscultation bilaterally with normal respiratory effort. CV: Nondisplaced  PMI.  Heart regular S1/S2, no S3/S4, no murmur.  No peripheral edema.  No carotid bruit.  Normal pedal pulses.  Abdomen: Soft, nontender, no hepatosplenomegaly, no distention.  Skin: Intact without lesions or rashes.  Neurologic: Sedated on vent.  Extremities: No clubbing or cyanosis.  HEENT: Normal.   1. VF arrest: Witnessed arrest but was down an unclear amount of time pre-CPR.  When EMS arrived, had 15 minutes CPR + shock x 4 with ROSC.  Cause of arrest uncertain, no chest pain prior but TnI 7949.   - Continue amiodarone  gtt.  - Wean sedation  to assess mental status.  Given unclear downtime, concerned for anoxic encephalopathy.  2. CAD: History of CAD with PCI to LAD in 2013.  No chest pain prior to event but HS-TnI elevated 7949, ?demand ischemia with prolonged shock vs ACS. Initial ECG without STEMI, nonspecific changes.  - Repeat ECG today.  - Continue heparin  gtt.  - Atorvastatin  80 daily - ASA per tube - If she has neurologic recovery, will need coronary angiography.  3. Cardiogenic shock: Now on NE 4 post arrest.   CVP 8.   - Should be able to wean off NE as we wean sedation.  - Echo today . - no diuresis for now - Co-ox now.  4. Elevated LFTs: Suspect shock liver.  - Follow CMET daily.  5. DKA: Poorly controlled DM2, A1c 10.9.  Beta hydroxybutyrate decreased to normal.  - Currently on insulin  gtt followed by CCM.  6. COVID+: Supportive care.  7. Acute hypoxemic respiratory failure: Unasyn  for aspiration coverage.  Vent per CCM.  8. 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  - Wean sedation today to assess neurological status.   CRITICAL CARE Performed by: Peder Bourdon  Total critical care time: 60 minutes  Critical care time was exclusive of separately billable procedures and treating other patients.  Critical care was necessary to treat or prevent imminent or life-threatening deterioration.  Critical care was time spent personally  by me on the following activities: development of treatment plan with patient and/or surrogate as well as nursing, discussions with consultants, evaluation of patient's response to treatment, examination of patient, obtaining history from patient or surrogate, ordering and performing treatments and interventions, ordering and review of laboratory studies, ordering and review of radiographic studies, pulse oximetry and re-evaluation of patient's condition.  Peder Bourdon 04/03/2023 11:03 AM

## 2023-04-03 NOTE — Progress Notes (Signed)
 PHARMACY - ANTICOAGULATION CONSULT NOTE  Pharmacy Consult for Heparin  Indication: chest pain/ACS - out-of-hospital VF arrest  No Known Allergies  Patient Measurements: Height: 5\' 7"  (170.2 cm) Weight: 88.5 kg (195 lb 1.7 oz) IBW/kg (Calculated) : 61.6 Heparin  Dosing Weight: 85 kg  Vital Signs: Temp: 100 F (37.8 C) (02/10 1830) Temp Source: Bladder (02/10 1600) BP: 131/62 (02/10 1830) Pulse Rate: 97 (02/10 1830)  Labs: Recent Labs    04/02/23 1249 04/02/23 1307 04/02/23 1558 04/02/23 2152 04/03/23 0114 04/03/23 0323 04/03/23 0544 04/03/23 0552 04/03/23 0739 04/03/23 0945 04/03/23 1710 04/03/23 1804  HGB 13.2   < >  --    < >  --  11.9* 11.6* 12.2  --   --   --   --   HCT 42.2   < >  --    < >  --  35.0* 35.2* 36.0  --   --   --   --   PLT 218  --   --   --   --   --  229  --   --   --   --   --   HEPARINUNFRC  --   --   --    < >  --   --  0.71*  --   --  0.83*  --  0.62  CREATININE 1.14*   < >  --    < > 0.75  --  0.66  --  0.69  --   --   --   TROPONINIHS 223*  --  1,610*  --   --   --   --   --   --   --  9,509*  --    < > = values in this interval not displayed.    Estimated Creatinine Clearance: 90.8 mL/min (by C-G formula based on SCr of 0.69 mg/dL).   Medical History: Past Medical History:  Diagnosis Date   Arthritis    CAD (coronary artery disease), native coronary artery 07/30/2018   Diabetes mellitus without complication (HCC)    type II    Heart attack (HCC)    Hypertension    NSTEMI (non-ST elevated myocardial infarction) (HCC) 02/08/2012    Medications:  Awaiting home med rec  Assessment: 58 y.o. F presents s/p out-of-hospital VF arrest, successful ROSC after 15 minutes of ACLS per EMS. To begin heparin  for ACS. Head CT negative. No AC PTA. CBC stable on admission.  Repeat heparin  level is therapeutic at 0.62.  Goal of Therapy:  Heparin  level 0.3-0.7 units/ml Monitor platelets by anticoagulation protocol: Yes   Plan:  Continue heparin   800 units/h Daily heparin  level and CBC  Levin Reamer, PharmD, BCPS, [facility] Clinical Pharmacist [phone] Please check AMION for all [facility] Pharmacy numbers 04/03/2023

## 2023-04-03 NOTE — Progress Notes (Signed)
 PHARMACY - ANTICOAGULATION CONSULT NOTE  Pharmacy Consult for Heparin  Indication: chest pain/ACS - out-of-hospital VF arrest  No Known Allergies  Patient Measurements: Height: 5\' 7"  (170.2 cm) Weight: 88.5 kg (195 lb 1.7 oz) IBW/kg (Calculated) : 61.6 Heparin  Dosing Weight: 85 kg  Vital Signs: Temp: 96.6 F (35.9 C) (02/10 1015) Temp Source: Bladder (02/10 0800) BP: 97/59 (02/10 1015) Pulse Rate: 60 (02/10 1015)  Labs: Recent Labs    04/02/23 1249 04/02/23 1307 04/02/23 1558 04/02/23 2152 04/03/23 0031 04/03/23 0114 04/03/23 0323 04/03/23 0544 04/03/23 0552 04/03/23 0739 04/03/23 0945  HGB 13.2   < >  --    < >  --   --  11.9* 11.6* 12.2  --   --   HCT 42.2   < >  --    < >  --   --  35.0* 35.2* 36.0  --   --   PLT 218  --   --   --   --   --   --  229  --   --   --   HEPARINUNFRC  --   --   --   --  0.73*  --   --  0.71*  --   --  0.83*  CREATININE 1.14*   < >  --    < >  --  0.75  --  0.66  --  0.69  --   TROPONINIHS 223*  --  1,610*  --   --   --   --   --   --   --   --    < > = values in this interval not displayed.    Estimated Creatinine Clearance: 90.8 mL/min (by C-G formula based on SCr of 0.69 mg/dL).   Medical History: Past Medical History:  Diagnosis Date   Arthritis    CAD (coronary artery disease), native coronary artery 07/30/2018   Diabetes mellitus without complication (HCC)    type II    Heart attack (HCC)    Hypertension    NSTEMI (non-ST elevated myocardial infarction) (HCC) 02/08/2012    Medications:  Awaiting home med rec  Assessment: 58 y.o. F presents s/p out-of-hospital VF arrest, successful ROSC after 15 minutes of ACLS per EMS. To begin heparin  for ACS. Head CT negative. No AC PTA. CBC stable on admission.  Heparin  level this morning came back elevated at 0.83, on heparin  infusion at 950 units/hr. Hgb 12.2, plt 229. No s/sx of bleeding or infusion issues. Confirmed with RN that drawn correctly.   Goal of Therapy:  Heparin   level 0.3-0.7 units/ml Monitor platelets by anticoagulation protocol: Yes   Plan:  Reduce heparin  infusion to 800 units/hr Order heparin  level in 6 hours  Daily heparin  level and CBC, s/sx of bleeding   Thank you for allowing pharmacy to participate in this patient's care,  Nieves Bars, PharmD, BCCCP Clinical Pharmacist  Phone: [phone] 04/03/2023 10:42 AM  Please check AMION for all [facility] Pharmacy phone numbers After 10:00 PM, call Main Pharmacy [phone]

## 2023-04-03 NOTE — TOC Initial Note (Signed)
 Transition of Care [facility]) - Initial/Assessment Note    Patient Details  Name: Vickie Little MRN: [id number] Date of Birth: May 26, 1965  [facility]) CM/SW Contact:    Benjiman Bras, RN Phone Number: [phone] 04/03/2023, 3:40 PM  Clinical Narrative:                 TOC CM spoke to pt's husband at bedside. Pt was independent PTA. Lives at home with husband. Husband states no paperwork needed for job at this time. Will continue to follow for dc needs.   Expected Discharge Plan: IP Rehab Facility Barriers to Discharge: Continued Medical Work up   Patient Goals and CMS Choice            Expected Discharge Plan and Services       Living arrangements for the past 2 months: Single Family Home                                      Prior Living Arrangements/Services Living arrangements for the past 2 months: Single Family Home Lives with:: Spouse          Need for Family Participation in Patient Care: Yes (Comment) Care giver support system in place?: Yes (comment)   Criminal Activity/Legal Involvement Pertinent to Current Situation/Hospitalization: No - Comment as needed  Activities of Daily Living      Permission Sought/Granted                  Emotional Assessment   Attitude/Demeanor/Rapport: Intubated (Following Commands or Not Following Commands)          Admission diagnosis:  Cardiac arrest [facility]) [I46.9] Cardiac arrest with ventricular fibrillation (HCC) [I46.9, I49.01] History of CAD (coronary artery disease) [Z86.79] Patient Active Problem List   Diagnosis Date Noted   Cardiac arrest (HCC) 04/02/2023   Elevated alkaline phosphatase level 08/30/2021   CAD (coronary artery disease), native coronary artery 07/30/2018   Spinal stenosis, lumbar region with neurogenic claudication 04/20/2016   Trichomonas vaginitis 06/23/2015   Diabetes mellitus type II, uncontrolled 02/10/2012   Hypertriglyceridemia 02/09/2012    Lower urinary tract infectious disease 02/09/2012   PCP:  Jacqulyne Maxim, MD Pharmacy:   [id number] Jonette Nestle, [place] - [address] AT [facility] & [address] [place] [address] Phone: [phone] Fax: [phone]     Social Drivers of Health (SDOH) Social History: SDOH Screenings   Food Insecurity: Low Risk  (10/26/2022)   Received from [facility]  Housing: Low Risk  (10/26/2022)   Received from [facility]  Transportation Needs: No Transportation Needs (10/26/2022)   Received from [facility]  Utilities: Low Risk  (10/26/2022)   Received from [facility]  Tobacco Use: Medium Risk (04/02/2023)   SDOH Interventions:     Readmission Risk Interventions     No data to display

## 2023-04-04 DIAGNOSIS — G931 Anoxic brain damage, not elsewhere classified: Secondary | ICD-10-CM

## 2023-04-04 DIAGNOSIS — I469 Cardiac arrest, cause unspecified: Secondary | ICD-10-CM | POA: Diagnosis not present

## 2023-04-04 DIAGNOSIS — U071 COVID-19: Secondary | ICD-10-CM | POA: Diagnosis not present

## 2023-04-04 DIAGNOSIS — I2510 Atherosclerotic heart disease of native coronary artery without angina pectoris: Secondary | ICD-10-CM | POA: Diagnosis not present

## 2023-04-04 DIAGNOSIS — J9601 Acute respiratory failure with hypoxia: Secondary | ICD-10-CM | POA: Diagnosis not present

## 2023-04-04 DIAGNOSIS — R570 Cardiogenic shock: Secondary | ICD-10-CM | POA: Diagnosis not present

## 2023-04-04 LAB — GLUCOSE, CAPILLARY
Glucose-Capillary: 212 mg/dL — ABNORMAL HIGH (ref 70–99)
Glucose-Capillary: 199 mg/dL — ABNORMAL HIGH (ref 70–99)
Glucose-Capillary: 183 mg/dL — ABNORMAL HIGH (ref 70–99)
Glucose-Capillary: 198 mg/dL — ABNORMAL HIGH (ref 70–99)
Glucose-Capillary: 273 mg/dL — ABNORMAL HIGH (ref 70–99)

## 2023-04-04 LAB — CBC
HCT: 37.3 % (ref 36.0–46.0)
Hemoglobin: 12.1 g/dL (ref 12.0–15.0)
MCH: 26.5 pg (ref 26.0–34.0)
MCHC: 32.4 g/dL (ref 30.0–36.0)
MCV: 81.8 fL (ref 80.0–100.0)
Platelets: 217 10*3/uL (ref 150–400)
RBC: 4.56 MIL/uL (ref 3.87–5.11)
RDW: 14.8 % (ref 11.5–15.5)
WBC: 14.2 10*3/uL — ABNORMAL HIGH (ref 4.0–10.5)
nRBC: 0 % (ref 0.0–0.2)

## 2023-04-04 LAB — HEPATIC FUNCTION PANEL
ALT: 155 U/L — ABNORMAL HIGH (ref 0–44)
AST: 84 U/L — ABNORMAL HIGH (ref 15–41)
Albumin: 2.8 g/dL — ABNORMAL LOW (ref 3.5–5.0)
Alkaline Phosphatase: 263 U/L — ABNORMAL HIGH (ref 38–126)
Bilirubin, Direct: 0.2 mg/dL (ref 0.0–0.2)
Indirect Bilirubin: 0.5 mg/dL (ref 0.3–0.9)
Total Bilirubin: 0.7 mg/dL (ref 0.0–1.2)
Total Protein: 5.8 g/dL — ABNORMAL LOW (ref 6.5–8.1)

## 2023-04-04 LAB — COOXEMETRY PANEL
Carboxyhemoglobin: 1.7 % — ABNORMAL HIGH (ref 0.5–1.5)
Methemoglobin: <0.7 % (ref 0.0–1.5)
O2 Saturation: 80.5 %
Total hemoglobin: 11.3 g/dL — ABNORMAL LOW (ref 12.0–16.0)

## 2023-04-04 LAB — BASIC METABOLIC PANEL
Anion gap: 10 (ref 5–15)
BUN: 10 mg/dL (ref 6–20)
CO2: 20 mmol/L — ABNORMAL LOW (ref 22–32)
Calcium: 8.8 mg/dL — ABNORMAL LOW (ref 8.9–10.3)
Chloride: 109 mmol/L (ref 98–111)
Creatinine, Ser: 0.73 mg/dL (ref 0.44–1.00)
GFR, Estimated: >60 mL/min (ref >60)
Glucose, Bld: 225 mg/dL — ABNORMAL HIGH (ref 70–99)
Potassium: 4.5 mmol/L (ref 3.5–5.1)
Sodium: 139 mmol/L (ref 135–145)

## 2023-04-04 LAB — HEPARIN LEVEL (UNFRACTIONATED): Heparin Unfractionated: 0.43 [IU]/mL (ref 0.30–0.70)

## 2023-04-04 LAB — PHOSPHORUS: Phosphorus: 4.7 mg/dL — ABNORMAL HIGH (ref 2.5–4.6)

## 2023-04-04 LAB — MAGNESIUM: Magnesium: 1.8 mg/dL (ref 1.7–2.4)

## 2023-04-04 LAB — PROCALCITONIN: Procalcitonin: 0.8 ng/mL

## 2023-04-04 MED ORDER — ACETAMINOPHEN 650 MG RE SUPP: 650.0000 mg | RECTAL | Status: DC

## 2023-04-04 MED ORDER — BUSPIRONE HCL 10 MG PO TABS
30.0000 mg | ORAL_TABLET | Freq: Three times a day (TID) | ORAL | Status: AC | PRN
  Administered 2023-04-04 – 2023-04-05 (×3): 30 mg
  Filled 2023-04-04 (×2): qty 3

## 2023-04-04 MED ORDER — MAGNESIUM SULFATE 2 GM/50ML IV SOLN
2.0000 g | Freq: Once | INTRAVENOUS | Status: AC
  Administered 2023-04-04: 2 g via INTRAVENOUS
  Filled 2023-04-04: qty 50

## 2023-04-04 MED ORDER — FUROSEMIDE 10 MG/ML IJ SOLN
40.0000 mg | Freq: Two times a day (BID) | INTRAMUSCULAR | Status: AC
  Administered 2023-04-04 (×2): 40 mg via INTRAVENOUS
  Filled 2023-04-04 (×2): qty 4

## 2023-04-04 MED ORDER — VITAL 1.5 CAL PO LIQD
1000.0000 mL | ORAL | Status: DC
  Administered 2023-04-04: 1000 mL

## 2023-04-04 MED ORDER — ACETAMINOPHEN 160 MG/5ML PO SOLN
650.0000 mg | ORAL | Status: DC
  Administered 2023-04-04 – 2023-04-05 (×7): 650 mg
  Filled 2023-04-04 (×8): qty 20.3

## 2023-04-04 MED ORDER — ACETAMINOPHEN 325 MG PO TABS: 650.0000 mg | ORAL_TABLET | ORAL | Status: DC

## 2023-04-04 MED ORDER — VITAL HIGH PROTEIN PO LIQD: 1000.0000 mL | ORAL | Status: DC

## 2023-04-04 MED ORDER — INSULIN GLARGINE-YFGN 100 UNIT/ML ~~LOC~~ SOLN
10.0000 [IU] | Freq: Two times a day (BID) | SUBCUTANEOUS | Status: DC
  Administered 2023-04-04 (×2): 10 [IU] via SUBCUTANEOUS
  Filled 2023-04-04 (×4): qty 0.1

## 2023-04-04 MED ORDER — BUSPIRONE HCL 10 MG PO TABS
30.0000 mg | ORAL_TABLET | Freq: Three times a day (TID) | ORAL | Status: DC | PRN
  Filled 2023-04-04: qty 3

## 2023-04-04 NOTE — Progress Notes (Addendum)
Advanced Heart Failure Rounding Note  Cardiologist: Tessa Lerner, DO   Chief Complaint: OOH VF arrest  Subjective:    BP soft this am. Cleviprex off and NE back on at 2 mcg/min, likely d/t sedation.  CO-OX 80%  CVP 13. Note left internal jugular central line position on CXR, likely needs to be pulled back.  Remains intubated and sedated. On unasyn w/ concern for aspiration PNA.   Objective:   Weight Range: 93.9 kg Body mass index is 32.42 kg/m.   Vital Signs:   Temp:  [95.5 F (35.3 C)-100.8 F (38.2 C)] 98.8 F (37.1 C) (02/11 0700) Pulse Rate:  [55-139] 66 (02/11 0716) Resp:  [0-29] 18 (02/11 0716) BP: (88-179)/(53-126) 100/53 (02/11 0716) SpO2:  [96 %-100 %] 100 % (02/11 0716) FiO2 (%):  [40 %] 40 % (02/11 0716) Weight:  [93.9 kg] 93.9 kg (02/11 0500) Last BM Date :  (PTA)  Weight change: Filed Weights   04/02/23 1300 04/03/23 0500 04/04/23 0500  Weight: 85 kg 88.5 kg 93.9 kg    Intake/Output:   Intake/Output Summary (Last 24 hours) at 04/04/2023 0717 Last data filed at 04/04/2023 0600 Gross per 24 hour  Intake 4166.04 ml  Output 1961 ml  Net 2205.04 ml      Physical Exam    General:  Acutely ill appearing. Cooling blanket in place HEENT: + ETT Neck: L internal jugular CVC, JVP appears elevated Cor: Regular rate & rhythm. No rubs, gallops or murmurs. Lungs: Clear anteriorly Abdomen: Soft, nontender, nondistended.  Extremities: No cyanosis, clubbing, rash, edema Neuro: Sedated on vent   Telemetry   SR 60s  Labs    CBC Recent Labs    04/03/23 0544 04/03/23 0552 04/04/23 0418  WBC 11.7*  --  14.2*  HGB 11.6* 12.2 12.1  HCT 35.2* 36.0 37.3  MCV 79.1*  --  81.8  PLT 229  --  217   Basic Metabolic Panel Recent Labs    16/10/96 0544 04/03/23 0552 04/03/23 0739 04/04/23 0418  NA 139   < > 139 139  K 3.9   < > 3.5 4.5  CL 105  --  112* 109  CO2 18*  --  19* 20*  GLUCOSE 138*  --  143* 225*  BUN 9  --  8 10  CREATININE 0.66   --  0.69 0.73  CALCIUM 9.4  --  8.7* 8.8*  MG 2.1  --   --  1.8  PHOS 2.8  --   --  4.7*   < > = values in this interval not displayed.   Liver Function Tests Recent Labs    04/03/23 0544 04/04/23 0418  AST 160* 84*  ALT 205* 155*  ALKPHOS 247* 263*  BILITOT 0.6 0.7  PROT 5.5* 5.8*  ALBUMIN 2.9* 2.8*   No results for input(s): "LIPASE", "AMYLASE" in the last 72 hours. Cardiac Enzymes No results for input(s): "CKTOTAL", "CKMB", "CKMBINDEX", "TROPONINI" in the last 72 hours.  BNP: BNP (last 3 results) No results for input(s): "BNP" in the last 8760 hours.  ProBNP (last 3 results) No results for input(s): "PROBNP" in the last 8760 hours.   D-Dimer No results for input(s): "DDIMER" in the last 72 hours. Hemoglobin A1C Recent Labs    04/02/23 1558  HGBA1C 9.2*   Fasting Lipid Panel Recent Labs    04/03/23 0544  TRIG 56   Thyroid Function Tests No results for input(s): "TSH", "T4TOTAL", "T3FREE", "THYROIDAB" in the last 72 hours.  Invalid input(s): "FREET3"  Other results:   Imaging    ECHOCARDIOGRAM COMPLETE Result Date: 04/03/2023    ECHOCARDIOGRAM REPORT   Patient Name:   NAJMO PARDUE Date of Exam: 04/03/2023 Medical Rec #:  [id number]          Height:       67.0 in Accession #:    [id number]         Weight:       195.1 lb Date of Birth:  08-Jul-1965           BSA:          2.001 m Patient Age:    58 years           BP:           112/62 mmHg Patient Gender: F                  HR:           60 bpm. Exam Location:  Inpatient Procedure: 2D Echo, 3D Echo, Cardiac Doppler and Color Doppler Indications:    Cardiac arrest I46.9  History:        Patient has no prior history of Echocardiogram examinations.                 CAD; Risk Factors:Diabetes.  Sonographer:    Lucendia Herrlich RCS Referring Phys: [phone] PETER E BABCOCK IMPRESSIONS  1. Left ventricular ejection fraction, by estimation, is 40 to 45%. The left ventricle has mildly decreased function. The left ventricle  demonstrates global hypokinesis. There is mild left ventricular hypertrophy. Left ventricular diastolic parameters are consistent with Grade I diastolic dysfunction (impaired relaxation).  2. Right ventricular systolic function is normal. The right ventricular size is normal.  3. The mitral valve is normal in structure. Trivial mitral valve regurgitation. No evidence of mitral stenosis.  4. The aortic valve is tricuspid. Aortic valve regurgitation is not visualized. Aortic valve sclerosis is present, with no evidence of aortic valve stenosis.  5. The inferior vena cava is normal in size with greater than 50% respiratory variability, suggesting right atrial pressure of 3 mmHg. Comparison(s): No prior Echocardiogram. FINDINGS  Left Ventricle: Left ventricular ejection fraction, by estimation, is 40 to 45%. The left ventricle has mildly decreased function. The left ventricle demonstrates global hypokinesis. The left ventricular internal cavity size was normal in size. There is  mild left ventricular hypertrophy. Left ventricular diastolic parameters are consistent with Grade I diastolic dysfunction (impaired relaxation). Right Ventricle: The right ventricular size is normal. Right ventricular systolic function is normal. Left [facility]: Left atrial size was normal in size. Right [facility]: Right atrial size was normal in size. Pericardium: There is no evidence of pericardial effusion. Mitral Valve: The mitral valve is normal in structure. Trivial mitral valve regurgitation. No evidence of mitral valve stenosis. Tricuspid Valve: The tricuspid valve is normal in structure. Tricuspid valve regurgitation is trivial. No evidence of tricuspid stenosis. Aortic Valve: The aortic valve is tricuspid. Aortic valve regurgitation is not visualized. Aortic valve sclerosis is present, with no evidence of aortic valve stenosis. Aortic valve peak gradient measures 6.4 mmHg. Pulmonic Valve: The pulmonic valve was normal in structure. Pulmonic  valve regurgitation is not visualized. No evidence of pulmonic stenosis. Aorta: The aortic root is normal in size and structure. Venous: The inferior vena cava is normal in size with greater than 50% respiratory variability, suggesting right atrial pressure of 3 mmHg. IAS/Shunts: No atrial level shunt detected by color  flow Doppler.  LEFT VENTRICLE PLAX 2D LVIDd:         4.00 cm   Diastology LVIDs:         2.90 cm   LV e' medial:    4.78 cm/s LV PW:         1.20 cm   LV E/e' medial:  15.6 LV IVS:        1.10 cm   LV e' lateral:   7.62 cm/s LVOT diam:     1.90 cm   LV E/e' lateral: 9.8 LV SV:         45 LV SV Index:   23 LVOT Area:     2.84 cm                           3D Volume EF:                          3D EF:        55 %                          LV EDV:       117 ml                          LV ESV:       53 ml                          LV SV:        64 ml RIGHT VENTRICLE            IVC RV S prime:     7.14 cm/s  IVC diam: 2.20 cm TAPSE (M-mode): 1.3 cm LEFT ATRIUM             Index        RIGHT ATRIUM           Index LA diam:        2.90 cm 1.45 cm/m   RA Area:     11.40 cm LA Vol (A2C):   39.5 ml 19.74 ml/m  RA Volume:   25.30 ml  12.64 ml/m LA Vol (A4C):   36.9 ml 18.44 ml/m LA Biplane Vol: 40.7 ml 20.34 ml/m  AORTIC VALVE AV Area (Vmax): 1.87 cm AV Vmax:        126.00 cm/s AV Peak Grad:   6.4 mmHg LVOT Vmax:      83.20 cm/s LVOT Vmean:     52.125 cm/s LVOT VTI:       0.159 m  AORTA Ao Root diam: 2.80 cm Ao Asc diam:  2.90 cm MITRAL VALVE               TRICUSPID VALVE MV Area (PHT): 3.72 cm    TR Peak grad:   3.9 mmHg MV Decel Time: 204 msec    TR Vmax:        99.10 cm/s MV E velocity: 74.70 cm/s MV A velocity: 76.40 cm/s  SHUNTS MV E/A ratio:  0.98        Systemic VTI:  0.16 m                            Systemic Diam: 1.90 cm Olga Millers MD Electronically signed by  Olga Millers MD Signature Date/Time: 04/03/2023/1:15:49 PM    Final      Medications:     Scheduled Medications:  aspirin  81  mg Per Tube Daily   atorvastatin  80 mg Per Tube Daily   Chlorhexidine Gluconate Cloth  6 each Topical Daily   docusate  100 mg Per Tube BID   ezetimibe  10 mg Per Tube Daily   famotidine  20 mg Per Tube BID   feeding supplement (PROSource TF20)  60 mL Per Tube Daily   feeding supplement (VITAL HIGH PROTEIN)  1,000 mL Per Tube Q24H   fentaNYL (SUBLIMAZE) injection  50 mcg Intravenous Once   insulin aspart  0-15 Units Subcutaneous Q4H   insulin aspart  2 Units Subcutaneous Q4H   mouth rinse  15 mL Mouth Rinse Q2H   polyethylene glycol  17 g Per Tube Daily    Infusions:  amiodarone 30 mg/hr (04/04/23 0600)   ampicillin-sulbactam (UNASYN) IV Stopped (04/04/23 0241)   clevidipine Stopped (04/04/23 0458)   dextrose     fentaNYL infusion INTRAVENOUS 100 mcg/hr (04/04/23 0600)   heparin 800 Units/hr (04/04/23 0600)   insulin Stopped (04/03/23 2044)   lactated ringers 40 mL/hr at 04/04/23 0600   norepinephrine (LEVOPHED) Adult infusion 2 mcg/min (04/04/23 0705)   propofol (DIPRIVAN) infusion 35 mcg/kg/min (04/04/23 0600)    PRN Medications: acetaminophen **OR** acetaminophen (TYLENOL) oral liquid 160 mg/5 mL **OR** acetaminophen, dextrose, fentaNYL, ondansetron (ZOFRAN) IV, mouth rinse    Patient Profile  Monesha Monreal is a 58 year old with past medical history of NSTEMI in 2013 s/p PCI to LAD, CAD, hypertension, hyperlipidemia, type 2 diabetes, PAD, and neurogenic lumbar claudication, and obesity.   Admitted with OOH VF arrest.  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  - Echo this admit with EF 40-45%, RV okay - may need R/LHC, once neuro  status is determined - On NE at 2 for hypotension, sedation likely contributing - CVP 13 (central line remains in central vein but probably needs to be pulled back), and net + 4L since admission. Start gentle diuresis with lasix 40 mg IV BID - CO-OX 80%. ? Septic component with rising WBCs and fever ( T max 100.8 F overnight). On unasyn for suspected aspiration and COVID +.   CAD H/o NSTEMI s/p PCI/overlapping DES X 2 to LAD in 2013. Also had 95% stenosis diagonal. - hs trop peaked at 9,509 - continue heparin drip - continue asa + atorvastatin 80 - continue zetia 10 mg daily - will likely need LHC once neuro prognostication complete   Shock liver - in the setting of VF arrest - Improving - Continue to follow   HTN - Now hypotensive with sedation. NE as above   DKA Uncontrolled Type 2 Diabetes - history of b/l retinopathy - A1C 10.6>9.5 - BHA 3.09>>0.08 - continue insulin and D5 per CCM for DKA   COVID + Acute respiratory failure with hypoxia Possible aspiration PNA - Vent management per CCM - On Unasyn - T max 100.8 F overnight. WBCs 11.7>14.7K. Respiratory culture w/ few GPC. Report pending.   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   Echo yesterday showed EF 40-45%, normal RV.   HS-TnI peaked at 9509.   She is currently sedated.  Sedation was weaned earlier today, she had minimal response to stimulation (reflexive movements).  She became hypertensive and restarted on sedation.  Currently on NE 2 with co-ox 80% and CVP 11-12.   Tm  100.8, PCT lower at 0.8 today.  She is on Unasyn, ?aspiration.  Also COVID+.   She remains on heparin gtt.   No further VT, on amiodarone gtt.   General: Sedated on vent.  Neck: JVP 10 cm, no thyromegaly or thyroid nodule.  Lungs: Clear to auscultation bilaterally with normal respiratory effort. CV: Nondisplaced PMI.  Heart regular S1/S2, no S3/S4, no murmur.  No peripheral edema.   Abdomen: Soft, nontender, no hepatosplenomegaly, no distention.  Skin: Intact without lesions or rashes.  Neurologic: Not responsive for me.  Extremities: No clubbing or cyanosis.  HEENT: Normal.   1. VF arrest: Witnessed arrest but was down an unclear amount of time pre-CPR.  When EMS arrived, had 15 minutes CPR + shock x 4 with ROSC.  Cause of arrest uncertain, no chest pain prior but TnI 9509.   - Continue amiodarone gtt.  2. CAD: History of CAD with PCI to LAD in 2013.  No chest pain prior to event but HS-TnI elevated 9509, ?demand ischemia with prolonged shock vs ACS. ECG without STEMI, nonspecific changes.  - Continue heparin gtt.  - Atorvastatin 80 daily - ASA per tube - If she has neurologic recovery, will need coronary angiography.  3. Cardiogenic shock: Echo with EF 40-45%, normal RV.  Previous echo was in 2013 with normal EF.  Now on NE 2, co-ox 80%.  Suspect this is primarily due to sedation, becomes hypertensive with sedation wean.  CVP 11-12.    - Should be able to wean off NE as we wean sedation.  - Lasix 40 mg IV bid today and stop LR infusion.  - Can use clevidipine gtt with SBP > 140.  4. Elevated LFTs: Suspect shock liver.  - Follow CMET daily.  5. DKA: Poorly controlled DM2, A1c 10.9.  Beta hydroxybutyrate decreased to normal.  - Now on [place] insulin.  6. COVID+: Supportive care.  7. Acute hypoxemic respiratory failure: Unasyn for aspiration coverage.  Vent per CCM.  8. 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 She did not have much response with  sedation wean earlier today.  - Continue serial neurologic exams.  - Suspect will need neuro consult for prognostication.  9. ID: Tm 100.8, PCT trending down at 0.8.  COVID+ but also with suspected aspiration PNA/pneumonitis.  - Continue Unasyn.   CRITICAL CARE Performed by: Marca Ancona  Total critical care time: 40 minutes  Critical care time was exclusive of separately billable procedures and treating other patients.  Critical care was necessary to treat or prevent imminent or life-threatening deterioration.  Critical care was time spent personally by me on the following activities: development of treatment plan with patient and/or  surrogate as well as nursing, discussions with consultants, evaluation of patient's response to treatment, examination of patient, obtaining history from patient or surrogate, ordering and performing treatments and interventions, ordering and review of laboratory studies, ordering and review of radiographic studies, pulse oximetry and re-evaluation of patient's condition.  Marca Ancona 04/04/2023 9:28 AM

## 2023-04-04 NOTE — Progress Notes (Signed)
Initial Nutrition Assessment  DOCUMENTATION CODES:   Obesity unspecified  INTERVENTION:  Initiate tube feeding via OGT: Vital 1.5 at 55 ml/h (1320 ml per day) Initiate TF at 63mL/hr and increase by 49mL/hr Q8 until goal rate achieved Prosource TF20 60 ml daily  Provides 2060 kcal, 109 gm protein, 1008 ml free water daily    NUTRITION DIAGNOSIS:  Inadequate oral intake related to inability to eat as evidenced by NPO status.  GOAL:  Patient will meet greater than or equal to 90% of their needs  MONITOR:  TF tolerance, Vent status, Labs  REASON FOR ASSESSMENT:   Consult Enteral/tube feeding initiation and management  ASSESSMENT:   Pt is a 57y.o female with cardiac arrest. PMH: T2DM, CAD, HTN, HLD, obesity. Required total of 15 minutes of CPR.   2/9 admitted/intubated/OGT placed 2/10 Echo performed  Also found to be COVID + on admission. Patient is currently intubated on ventilator support MV: 8.9 L/min Temp (24hrs), Avg:98.9 F (37.2 C), Min:96.8 F (36 C), Max:100.8 F (38.2 C)  Propofol: 17.85 ml/hr   Requiring no pressor support. Remains sedated on proprofol and fentanyl. TTM pads in place.    Admit weight 88.5kg; current weight 93.9kg. UOP of 1.9L yesterday w/ already today. No edema noted. Per chart review, UBW over last year appears to be between 83-87kg.   OGT verified via xray with tip and side port in the stomach. Noted large stool burden in R colon. Bowels last moved 2/9. Noted with hypoactive bowel sounds. Discussed advancement of TF regimen today with attending via secure chat.  Some acquaintances of patient at bedside during visit, but unable to report on nutrition-related history. She is independent PTA and living at home with her husband.    Intake/Output Summary (Last 24 hours) at 04/04/2023 1139 Last data filed at 04/04/2023 1100 Gross per 24 hour  Intake 3936.68 ml  Output 2423 ml  Net 1513.68 ml    Did require Mg and KCl repletion per  protocol.    Meds: docusate, famotidine, SSI, semglee, Miralax, IV ABX, furosemide, KCl x1 Drips: 2g Mg x1 Proprofol @ 17.48mL/hr Fentanyl @100mcg /hr Cleviprex stopped today at 0458 Levophed stopped today at 0813  Labs:  Na 139 (wdl)  K+ 4.5 (wdl) PHOS 2.8>4.7 (H) Mg 2.1>1.8 (wdl) Lactic Acid 10>7.0>5.1>3.0 (H) WBC 11.7>14.2 (H) CBGs 138-225 over 24 hours A1c 9.2 (03/2023)   NUTRITION - FOCUSED PHYSICAL EXAM:  Flowsheet Row Most Recent Value  Orbital Region Unable to assess  [ETT holder]  Upper Arm Region No depletion  Thoracic and Lumbar Region Unable to assess  [TTM pads]  Buccal Region Unable to assess  [ETT holder]  Temple Region No depletion  Clavicle Bone Region Mild depletion  Clavicle and Acromion Bone Region Mild depletion  Scapular Bone Region Unable to assess  [TTM pads]  Dorsal Hand No depletion  Patellar Region No depletion  Anterior Thigh Region Unable to assess  [TTM pads]  Posterior Calf Region No depletion  Edema (RD Assessment) None  Hair Reviewed  Eyes Reviewed  Mouth Reviewed  Skin Reviewed  Nails Reviewed    Diet Order:   Diet Order             Diet NPO time specified  Diet effective now             EDUCATION NEEDS:   Not appropriate for education at this time  Skin:  Skin Assessment: Reviewed RN Assessment  Last BM:  2/9  Height:  Ht Readings from  Last 1 Encounters:  04/02/23 5\' 7"  (1.702 m)    Weight:  Wt Readings from Last 1 Encounters:  04/04/23 93.9 kg    Ideal Body Weight:  61.4 kg  BMI:  Body mass index is 32.42 kg/m.  Estimated Nutritional Needs:   Kcal:  2000-2200kcal  Protein:  100-110g  Fluid:  >2L/day  Myrtie Cruise MS, RD, LDN Registered Dietitian Clinical Nutrition RD Inpatient Contact Info in Amion

## 2023-04-04 NOTE — Inpatient Diabetes Management (Signed)
Inpatient Diabetes Program Recommendations  AACE/ADA: New Consensus Statement on Inpatient Glycemic Control (2015)  Target Ranges:  Prepandial:   less than 140 mg/dL      Peak postprandial:   less than 180 mg/dL (1-2 hours)      Critically ill patients:  140 - 180 mg/dL   Lab Results  Component Value Date   GLUCAP 199 (H) 04/04/2023   HGBA1C 9.2 (H) 04/02/2023    Review of Glycemic Control  Latest Reference Range & Units 04/04/23 04:18 04/04/23 07:35  Glucose-Capillary 70 - 99 mg/dL 621 (H) 308 (H)   Diabetes history: DM  Outpatient Diabetes medications:  Farxiga 10 mg daily Amaryl 2 mg daily Metformin 1000 mg bid Ozempic 2 mg weekly Current orders for Inpatient glycemic control:  Novolog 0-15 units q 4 hours Novolog 2 units q 4 hours  Inpatient Diabetes Program Recommendations:    Patient transitioned off insulin drip overnight. May consider adding Semglee 10 units bid.   Thanks,  Lorenza Cambridge, RN, BC-ADM Inpatient Diabetes Coordinator Pager [phone]  (8a-5p)

## 2023-04-04 NOTE — Plan of Care (Signed)
Problem: Fluid Volume: Goal: Ability to maintain a balanced intake and output will improve Outcome: Progressing   Problem: Health Behavior/Discharge Planning: Goal: Ability to identify and utilize available resources and services will improve Outcome: Progressing Goal: Ability to manage health-related needs will improve Outcome: Progressing

## 2023-04-04 NOTE — Progress Notes (Signed)
NAME:  Vickie Little, MRN:  [id number], DOB:  1965/06/14, LOS: 2 ADMISSION DATE:  04/02/2023, CONSULTATION DATE:  04/02/2023 REFERRING MD:  Denton Lank - EDP, CHIEF COMPLAINT: Cardiac arrest    History of Present Illness:  58 year old female patient with history of metabolic syndrome, and coronary artery disease followed by cardiology last seen in January 2025.  Was at church singing in choir, had a witnessed collapse.  EMS was called on fire arrival the patient was agonal with weak pulses. On EMS arrival the patient was pulseless CPR was initiated, initial rhythm VF, ACLS interventions included epinephrine x 2, defibrillation x 5, amiodarone 300 mg.  An IO was placed in the right humerus she was intubated return of spontaneous circulation estimated at 15 minutes On arrival to [facility] initial EKG negative for ST elevation white blood cell count 11.7 glucose 408 post CPR lactic acid 10 Cardiology consulted by EDP Critical care asked to admit.  Pertinent Medical History:  Coronary artery disease, with prior angina seen by cardiology last in January 2025 type 2 diabetes with retinopathy, last A1c of record 9.5, hypertension, hyperlipidemia Remote herniated disc Obesity  Significant Hospital Events: Including procedures, antibiotic start and stop dates in addition to other pertinent events   2/9 Admitted status post VF arrest time to return of spontaneous circulation estimated at 15 minutes hemodynamically stable on arrival to the ER decorticate posturing  Interim History / Subjective:  No significant events overnight Labile BP in the setting of sedation; with sedation weaned yesterday actually more hypertensive; hypotensive this AM with sedation on 2mg  NE Slightly improved neuro exam today even on Prop 35/Fent 100 Wean sedation as able for better neuro assessment Husband at bedside  Objective:  Blood pressure (!) 100/53, pulse 66, temperature 98.8 F (37.1 C), resp. rate 18, height 5\' 7"   (1.702 m), weight 93.9 kg, last menstrual period 03/25/2016, SpO2 100%. CVP:  [7 mmHg-19 mmHg] 10 mmHg  Vent Mode: PRVC FiO2 (%):  [40 %] 40 % Set Rate:  [18 bmp] 18 bmp Vt Set:  [490 mL] 490 mL PEEP:  [5 cmH20] 5 cmH20 Plateau Pressure:  [16 cmH20-24 cmH20] 19 cmH20   Intake/Output Summary (Last 24 hours) at 04/04/2023 0750 Last data filed at 04/04/2023 0700 Gross per 24 hour  Intake 4298.54 ml  Output 1961 ml  Net 2337.54 ml   Filed Weights   04/02/23 1300 04/03/23 0500 04/04/23 0500  Weight: 85 kg 88.5 kg 93.9 kg   Physical Examination: General: Acutely ill-appearing middle-aged woman in NAD. HEENT: [place]/AT, anicteric sclera, PERRL 3mm sluggish, moist mucous membranes. Neuro:  Intubated, sedated.  Does not respond to verbal, tactile or noxious stimuli. Not following commands. Flicker to pain in BLE, +slight withdrawal from pain in BUE versus reflex to pain. +Corneal, +Cough, and +Gag  CV: RRR, no m/g/r. PULM: Breathing even and unlabored on vent (PEEP 5, FiO2 40%). Lung fields CTAB. GI: Soft, nontender, nondistended. Hypoactive bowel sounds. Extremities: No significant LE edema noted. Skin: Warm/dry, no rashes.  Resolved Hospital Problem List:  Lactic acidosis Status post cardiac arrest  Assessment & Plan:  Status post VF arrest with known history of coronary artery disease Echo 2/10 with EF 40-45%, global hypokinesis, mild LVH, G1DD, trivial MR. - Targeted temperature management/normothermia protocol - Goal MAP > 65 - Levophed titrated to goal MAP; BP has been labile r/t sedation, favor weaning sedation and treating BP as needed - Heparin gtt ongoing given ?ACS/ischemia - Continue amiodarone - ASA/statin - Cardiology following,  appreciate recommendations - Ischemia eval pending clinical trajectory/neurologic recovery  Acute respiratory failure following cardiopulmonary arrest COVID-19 positive Intubated in the field, at risk for aspiration. ETT exchange completed  2/9PM. - Continue full vent support (4-8cc/kg IBW) - Wean FiO2 for O2 sat > 90% - Daily WUA/SBT once appropriate from a neurologic/mental status standpoint - VAP bundle - Pulmonary hygiene - PAD protocol for sedation: Propofol and Fentanyl for goal RASS 0 to -1 - Empiric Unasyn for ?aspiration coverage - Follow Resp Cx, prelim few GPCs 2/11  Acute metabolic encephalopathy status postcardiac arrest concerning for anoxic brain injury CT Head 2/9 NAICA. - Awaiting 48-72H mark post-arrest prior to any neuroprognostication - MRI Brain after 72H, pending neuro exam - PAD protocol in place, weaning sedation as able - Neuroprotective measures: HOB > 30 degrees, normoglycemia, normothermia, electrolytes WNL  Diabetes with hyperglycemia - Transitioned off of insulin gtt 2/10PM - TF coverage 2U Q4H - CBGs Q4H - Goal CBG 140-180 - Consider addition of basal insulin if glucoses remain elevated  Best Practice (right click and "Reselect all SmartList Selections" daily)   Diet/type: NPO DVT prophylaxis systemic heparin Pressure ulcer(s): N/A GI prophylaxis: H2B Lines: N/A Foley:  Yes, and it is still needed Code Status:  full code Last date of multidisciplinary goals of care discussion [2/11 - Husband at bedside, confirms full code status]  Critical care time:    The patient is critically ill with multiple organ system failure and requires high complexity decision making for assessment and support, frequent evaluation and titration of therapies, advanced monitoring, review of radiographic studies and interpretation of complex data.   Critical Care Time devoted to patient care services, exclusive of separately billable procedures, described in this note is 36 minutes.  Tim Lair, PA-C [facility] 04/04/23 7:50 AM  Please see [email or web address] for pager details.  From 7A-7P if no response, please call [phone] After hours, please call ELink [phone]

## 2023-04-04 NOTE — Progress Notes (Signed)
PHARMACY - ANTICOAGULATION CONSULT NOTE  Pharmacy Consult for Heparin Indication: chest pain/ACS - out-of-hospital VF arrest  No Known Allergies  Patient Measurements: Height: 5\' 7"  (170.2 cm) Weight: 93.9 kg (207 lb 0.2 oz) (TTM pads in place) IBW/kg (Calculated) : 61.6 Heparin Dosing Weight: 85 kg  Vital Signs: Temp: 100.2 F (37.9 C) (02/11 1300) Temp Source: Bladder (02/11 0800) BP: 107/61 (02/11 1300) Pulse Rate: 67 (02/11 1300)  Labs: Recent Labs    04/02/23 1249 04/02/23 1307 04/02/23 1558 04/02/23 2152 04/03/23 0544 04/03/23 0552 04/03/23 0739 04/03/23 0945 04/03/23 1710 04/03/23 1804 04/04/23 0418  HGB 13.2   < >  --    < > 11.6* 12.2  --   --   --   --  12.1  HCT 42.2   < >  --    < > 35.2* 36.0  --   --   --   --  37.3  PLT 218  --   --   --  229  --   --   --   --   --  217  HEPARINUNFRC  --   --   --    < > 0.71*  --   --  0.83*  --  0.62 0.43  CREATININE 1.14*   < >  --    < > 0.66  --  0.69  --   --   --  0.73  TROPONINIHS 223*  --  1,478*  --   --   --   --   --  9,509*  --   --    < > = values in this interval not displayed.    Estimated Creatinine Clearance: 93.4 mL/min (by C-G formula based on SCr of 0.73 mg/dL).   Medical History: Past Medical History:  Diagnosis Date   Arthritis    CAD (coronary artery disease), native coronary artery 07/30/2018   Diabetes mellitus without complication (HCC)    type II    Heart attack (HCC)    Hypertension    NSTEMI (non-ST elevated myocardial infarction) (HCC) 02/08/2012     Assessment: 58 y.o. F presents s/p out-of-hospital VF arrest, successful ROSC after 15 minutes of ACLS per EMS. To begin heparin for ACS. Head CT negative. No AC PTA. CBC stable on admission. Heparin drip 800 uts/hr with heparin level 0.43 at goal.  Cbc stable no bleeding noted   Goal of Therapy:  Heparin level 0.3-0.7 units/ml Monitor platelets by anticoagulation protocol: Yes   Plan:  Continue heparin 800 units/h Daily  heparin level and CBC    Leota Sauers Pharm.D. CPP, BCPS Clinical Pharmacist [phone] 04/04/2023 1:22 PM   Please check AMION for all [facility] numbers 04/04/2023

## 2023-04-04 NOTE — Progress Notes (Signed)
Consulted for declotting of L internal jugular CVC. Upon looking at CXR noted that tip of CVC is in R brachiocephalic and not centrally located, therefore not under protocol for alteplase instillation. Recommend repositioning or replacing CVC. Primary RN notified.

## 2023-04-05 ENCOUNTER — Inpatient Hospital Stay (HOSPITAL_COMMUNITY): Payer: Managed Care, Other (non HMO)

## 2023-04-05 DIAGNOSIS — G931 Anoxic brain damage, not elsewhere classified: Secondary | ICD-10-CM | POA: Diagnosis not present

## 2023-04-05 DIAGNOSIS — I2510 Atherosclerotic heart disease of native coronary artery without angina pectoris: Secondary | ICD-10-CM | POA: Diagnosis not present

## 2023-04-05 DIAGNOSIS — R570 Cardiogenic shock: Secondary | ICD-10-CM | POA: Diagnosis not present

## 2023-04-05 DIAGNOSIS — J9601 Acute respiratory failure with hypoxia: Secondary | ICD-10-CM | POA: Diagnosis not present

## 2023-04-05 DIAGNOSIS — I469 Cardiac arrest, cause unspecified: Secondary | ICD-10-CM | POA: Diagnosis not present

## 2023-04-05 DIAGNOSIS — U071 COVID-19: Secondary | ICD-10-CM | POA: Diagnosis not present

## 2023-04-05 LAB — GLUCOSE, CAPILLARY
Glucose-Capillary: 214 mg/dL — ABNORMAL HIGH (ref 70–99)
Glucose-Capillary: 243 mg/dL — ABNORMAL HIGH (ref 70–99)
Glucose-Capillary: 251 mg/dL — ABNORMAL HIGH (ref 70–99)
Glucose-Capillary: 256 mg/dL — ABNORMAL HIGH (ref 70–99)
Glucose-Capillary: 270 mg/dL — ABNORMAL HIGH (ref 70–99)
Glucose-Capillary: 307 mg/dL — ABNORMAL HIGH (ref 70–99)
Glucose-Capillary: 330 mg/dL — ABNORMAL HIGH (ref 70–99)

## 2023-04-05 LAB — BASIC METABOLIC PANEL
Anion gap: 19 — ABNORMAL HIGH (ref 5–15)
BUN: 19 mg/dL (ref 6–20)
CO2: 20 mmol/L — ABNORMAL LOW (ref 22–32)
Calcium: 9.4 mg/dL (ref 8.9–10.3)
Chloride: 103 mmol/L (ref 98–111)
Creatinine, Ser: 0.78 mg/dL (ref 0.44–1.00)
GFR, Estimated: >60 mL/min (ref >60)
Glucose, Bld: 307 mg/dL — ABNORMAL HIGH (ref 70–99)
Potassium: 4 mmol/L (ref 3.5–5.1)
Sodium: 142 mmol/L (ref 135–145)

## 2023-04-05 LAB — MAGNESIUM: Magnesium: 2.3 mg/dL (ref 1.7–2.4)

## 2023-04-05 LAB — COOXEMETRY PANEL
Carboxyhemoglobin: 1.1 % (ref 0.5–1.5)
Carboxyhemoglobin: 0.9 % (ref 0.5–1.5)
Methemoglobin: <0.7 % (ref 0.0–1.5)
Methemoglobin: 0.7 % (ref 0.0–1.5)
O2 Saturation: 54.1 %
O2 Saturation: 67 %
Total hemoglobin: 12.6 g/dL (ref 12.0–16.0)
Total hemoglobin: 13.1 g/dL (ref 12.0–16.0)

## 2023-04-05 LAB — CULTURE, RESPIRATORY W GRAM STAIN

## 2023-04-05 LAB — PROCALCITONIN: Procalcitonin: 0.66 ng/mL

## 2023-04-05 LAB — HEPATIC FUNCTION PANEL
ALT: 119 U/L — ABNORMAL HIGH (ref 0–44)
AST: 74 U/L — ABNORMAL HIGH (ref 15–41)
Albumin: 3.1 g/dL — ABNORMAL LOW (ref 3.5–5.0)
Alkaline Phosphatase: 279 U/L — ABNORMAL HIGH (ref 38–126)
Bilirubin, Direct: 0.1 mg/dL (ref 0.0–0.2)
Indirect Bilirubin: 0.7 mg/dL (ref 0.3–0.9)
Total Bilirubin: 0.8 mg/dL (ref 0.0–1.2)
Total Protein: 6.5 g/dL (ref 6.5–8.1)

## 2023-04-05 LAB — PHOSPHORUS: Phosphorus: 3.7 mg/dL (ref 2.5–4.6)

## 2023-04-05 LAB — CBC
HCT: 38.1 % (ref 36.0–46.0)
Hemoglobin: 12.6 g/dL (ref 12.0–15.0)
MCH: 26.6 pg (ref 26.0–34.0)
MCHC: 33.1 g/dL (ref 30.0–36.0)
MCV: 80.5 fL (ref 80.0–100.0)
Platelets: 238 10*3/uL (ref 150–400)
RBC: 4.73 MIL/uL (ref 3.87–5.11)
RDW: 14.8 % (ref 11.5–15.5)
WBC: 15.2 10*3/uL — ABNORMAL HIGH (ref 4.0–10.5)
nRBC: 0 % (ref 0.0–0.2)

## 2023-04-05 LAB — HEPARIN LEVEL (UNFRACTIONATED): Heparin Unfractionated: 0.29 [IU]/mL — ABNORMAL LOW (ref 0.30–0.70)

## 2023-04-05 MED ORDER — FUROSEMIDE 10 MG/ML IJ SOLN
40.0000 mg | Freq: Once | INTRAMUSCULAR | Status: AC
  Administered 2023-04-05: 40 mg via INTRAVENOUS
  Filled 2023-04-05: qty 4

## 2023-04-05 MED ORDER — ENOXAPARIN SODIUM 40 MG/0.4ML IJ SOSY
40.0000 mg | PREFILLED_SYRINGE | INTRAMUSCULAR | Status: DC
  Administered 2023-04-05 – 2023-04-10 (×6): 40 mg via SUBCUTANEOUS
  Filled 2023-04-05 (×6): qty 0.4

## 2023-04-05 MED ORDER — INSULIN ASPART 100 UNIT/ML IJ SOLN
4.0000 [IU] | INTRAMUSCULAR | Status: DC
  Administered 2023-04-05 – 2023-04-06 (×8): 4 [IU] via SUBCUTANEOUS

## 2023-04-05 MED ORDER — IPRATROPIUM-ALBUTEROL 0.5-2.5 (3) MG/3ML IN SOLN
RESPIRATORY_TRACT | Status: AC
  Filled 2023-04-05: qty 9

## 2023-04-05 MED ORDER — IPRATROPIUM-ALBUTEROL 0.5-2.5 (3) MG/3ML IN SOLN
3.0000 mL | RESPIRATORY_TRACT | Status: DC | PRN
  Administered 2023-04-06 (×2): 3 mL via RESPIRATORY_TRACT
  Filled 2023-04-05: qty 3

## 2023-04-05 MED ORDER — POTASSIUM CHLORIDE CRYS ER 20 MEQ PO TBCR: 40.0000 meq | EXTENDED_RELEASE_TABLET | Freq: Once | ORAL | Status: DC

## 2023-04-05 MED ORDER — POTASSIUM CHLORIDE 20 MEQ PO PACK
40.0000 meq | PACK | Freq: Once | ORAL | Status: AC
  Administered 2023-04-05: 40 meq
  Filled 2023-04-05: qty 2

## 2023-04-05 MED ORDER — INSULIN GLARGINE-YFGN 100 UNIT/ML ~~LOC~~ SOLN
15.0000 [IU] | Freq: Two times a day (BID) | SUBCUTANEOUS | Status: DC
  Administered 2023-04-05 – 2023-04-06 (×3): 15 [IU] via SUBCUTANEOUS
  Filled 2023-04-05 (×4): qty 0.15

## 2023-04-05 NOTE — Progress Notes (Signed)
eLink Physician-Brief Progress Note Patient Name: FARTUN PARADISO DOB: 1965-11-17 MRN: [id number]   Date of Service  04/05/2023  HPI/Events of Note  7 watery stools, ongoing watery stools No coagulopathy at this time Currently on antibiotics but not a candidate for C. difficile testing due to ongoing polyethylene glycol and docusate   eICU Interventions  Discontinue laxatives Insert Flexi-Seal If  leukocytosis worsens or she has any fever, can consider C. difficile testing.   70 -patient has been more tachypneic to the 40s tachycardic and ventilated with a peep of 5.  PEEP increased to 8.  Primary team prefers to hold sedation, but the patient appears to be uncomfortable and potentially neurostorming.  Will attempt fentanyl 100 mcg x 1. MRI consistent with potential anoxic injury.  Tachypnea induced hypoxemia  0647 -had another episode of tachypnea noted, cardiac, and hypoxemia.  Last event was improved with prior to repeat febrile portion now.  Will obtain repeat ABG now the last gas consistent severe hypoxemia.  Increased time.    Intervention Category Minor Interventions: Routine modifications to care plan (e.g. PRN medications for pain, fever)  Krysia Zahradnik 04/05/2023, 10:55 PM

## 2023-04-05 NOTE — Inpatient Diabetes Management (Signed)
Inpatient Diabetes Program Recommendations  AACE/ADA: New Consensus Statement on Inpatient Glycemic Control (2015)  Target Ranges:  Prepandial:   less than 140 mg/dL      Peak postprandial:   less than 180 mg/dL (1-2 hours)      Critically ill patients:  140 - 180 mg/dL   Lab Results  Component Value Date   GLUCAP 270 (H) 04/05/2023   HGBA1C 9.2 (H) 04/02/2023    Review of Glycemic Control  Latest Reference Range & Units 04/04/23 15:54 04/04/23 20:14 04/05/23 00:06 04/05/23 03:42 04/05/23 08:31  Glucose-Capillary 70 - 99 mg/dL 119 (H) 147 (H) 829 (H) 256 (H) 270 (H)  Diabetes history: DM  Outpatient Diabetes medications:  Farxiga 10 mg daily Amaryl 2 mg daily Metformin 1000 mg bid Ozempic 2 mg weekly Current orders for Inpatient glycemic control:  Novolog 0-15 units q 4 hours Novolog 4 units q 4 hours Semglee 15 units bid  Inpatient Diabetes Program Recommendations:    Agree with increase in Semglee and tube feed coverage.  Will follow.   Thanks,  Lorenza Cambridge, RN, BC-ADM Inpatient Diabetes Coordinator Pager [phone]  (8a-5p)

## 2023-04-05 NOTE — Progress Notes (Addendum)
Advanced Heart Failure Rounding Note  Cardiologist: Tessa Lerner, DO  HF Cardiologist: Dr. Shirlee Latch  Chief Complaint: OOH VF arrest  Subjective:    CO-OX lower today at 54% with Fick CI of 1.87 L/min/m2.  Off NE. Back on cleviprex for BP control.  CVP 10. 4.7L UOP yesterday with IV lasix 40 BID.  Remains intubated. Propofol and fentanyl back on overnight d/t tachypnea.  Objective:   Weight Range: 88.5 kg Body mass index is 30.56 kg/m.   Vital Signs:   Temp:  [98.2 F (36.8 C)-100.2 F (37.9 C)] 98.4 F (36.9 C) (02/12 0600) Pulse Rate:  [65-134] 110 (02/12 0645) Resp:  [13-30] 25 (02/12 0645) BP: (88-153)/(53-139) 153/139 (02/12 0645) SpO2:  [92 %-100 %] 93 % (02/12 0645) FiO2 (%):  [40 %] 40 % (02/12 0239) Weight:  [88.5 kg] 88.5 kg (02/12 0500) Last BM Date : 04/02/23  Weight change: Filed Weights   04/03/23 0500 04/04/23 0500 04/05/23 0500  Weight: 88.5 kg 93.9 kg 88.5 kg    Intake/Output:   Intake/Output Summary (Last 24 hours) at 04/05/2023 0653 Last data filed at 04/05/2023 0600 Gross per 24 hour  Intake 3186.9 ml  Output 4665 ml  Net -1478.1 ml      Physical Exam    General: Acutely ill appearing Neck: L internal jugular CVC Cor: =Regular rate & rhythm, tachy. No rubs, gallops or murmurs. Lungs: on vent, clear anteriorly Abdomen: obese, soft, nontender, nondistended.  Extremities: no cyanosis, clubbing, rash, edema Neuro: sedated on vent    Telemetry   ST 110s-120s  Labs    CBC Recent Labs    04/04/23 0418 04/05/23 0523  WBC 14.2* 15.2*  HGB 12.1 12.6  HCT 37.3 38.1  MCV 81.8 80.5  PLT 217 238   Basic Metabolic Panel Recent Labs    60/45/40 0418 04/05/23 0523  NA 139 142  K 4.5 4.0  CL 109 103  CO2 20* 20*  GLUCOSE 225* 307*  BUN 10 19  CREATININE 0.73 0.78  CALCIUM 8.8* 9.4  MG 1.8 2.3  PHOS 4.7* 3.7   Liver Function Tests Recent Labs    04/04/23 0418 04/05/23 0523  AST 84* 74*  ALT 155* 119*  ALKPHOS  263* 279*  BILITOT 0.7 0.8  PROT 5.8* 6.5  ALBUMIN 2.8* 3.1*   No results for input(s): "LIPASE", "AMYLASE" in the last 72 hours. Cardiac Enzymes No results for input(s): "CKTOTAL", "CKMB", "CKMBINDEX", "TROPONINI" in the last 72 hours.  BNP: BNP (last 3 results) No results for input(s): "BNP" in the last 8760 hours.  ProBNP (last 3 results) No results for input(s): "PROBNP" in the last 8760 hours.   D-Dimer No results for input(s): "DDIMER" in the last 72 hours. Hemoglobin A1C Recent Labs    04/02/23 1558  HGBA1C 9.2*   Fasting Lipid Panel Recent Labs    04/03/23 0544  TRIG 56   Thyroid Function Tests No results for input(s): "TSH", "T4TOTAL", "T3FREE", "THYROIDAB" in the last 72 hours.  Invalid input(s): "FREET3"  Other results:   Imaging    No results found.    Medications:     Scheduled Medications:  acetaminophen  650 mg Oral Q4H   Or   acetaminophen (TYLENOL) oral liquid 160 mg/5 mL  650 mg Per Tube Q4H   Or   acetaminophen  650 mg Rectal Q4H   aspirin  81 mg Per Tube Daily   atorvastatin  80 mg Per Tube Daily   Chlorhexidine Gluconate Cloth  6 each Topical Daily   docusate  100 mg Per Tube BID   ezetimibe  10 mg Per Tube Daily   famotidine  20 mg Per Tube BID   feeding supplement (PROSource TF20)  60 mL Per Tube Daily   insulin aspart  0-15 Units Subcutaneous Q4H   insulin aspart  2 Units Subcutaneous Q4H   insulin glargine-yfgn  10 Units Subcutaneous BID   mouth rinse  15 mL Mouth Rinse Q2H   polyethylene glycol  17 g Per Tube Daily    Infusions:  amiodarone 30 mg/hr (04/05/23 0600)   ampicillin-sulbactam (UNASYN) IV Stopped (04/05/23 0239)   clevidipine 8 mg/hr (04/05/23 0600)   feeding supplement (VITAL 1.5 CAL) 55 mL/hr at 04/05/23 0600   fentaNYL infusion INTRAVENOUS Stopped (04/04/23 1341)   heparin 800 Units/hr (04/05/23 0600)   norepinephrine (LEVOPHED) Adult infusion Stopped (04/04/23 0813)   propofol (DIPRIVAN) infusion 15  mcg/kg/min (04/05/23 0600)    PRN Medications: acetaminophen **OR** acetaminophen (TYLENOL) oral liquid 160 mg/5 mL **OR** acetaminophen, busPIRone **OR** busPIRone, dextrose, fentaNYL, ondansetron (ZOFRAN) IV, mouth rinse    Patient Profile   Vickie Little is a 58 year old with past medical history of NSTEMI in 2013 s/p PCI to LAD, CAD, hypertension, hyperlipidemia, type 2 diabetes, PAD, and neurogenic lumbar claudication, and obesity.   Admitted with OOH VF arrest.  Assessment/Plan   1. VF arrest: Witnessed arrest but was down an unclear amount of time pre-CPR.  When EMS arrived, had 15 minutes CPR + shock x 4 with ROSC.  Cause of arrest uncertain, no chest pain prior but TnI 9509.   - Continue amiodarone gtt.  2. CAD: History of CAD with PCI to LAD in 2013.  No chest pain prior to event but HS-TnI elevated 9509, ?demand ischemia with prolonged shock vs ACS. ECG without STEMI, nonspecific changes.  - Stop heparin gtt for ACS. Switch to DVT dose. - Atorvastatin 80 daily - ASA per tube - If she has neurologic recovery, will need coronary angiography.  3. Cardiogenic shock: Echo with EF 40-45%, normal RV.  Previous echo was in 2013 with normal EF.  - CO-OX down from 80% to 54% with Fick CI 1.87 L/min/m2. Repeat.  - Now off NE. On Cleviprex for BP. - CVP 10. Excellent diuresis yesterday with 40 mg lasix IV BID. Give 40 mg lasix IV X 1.  4. Elevated LFTs: Suspect shock liver.  - Improving - Follow CMET daily.  5. DKA: Poorly controlled DM2, A1c 10.9.  Beta hydroxybutyrate decreased to normal.  - Now on [place] insulin.  6. COVID+: Supportive care.  7. Acute hypoxemic respiratory failure: Unasyn for aspiration coverage.  Vent per CCM.  8. 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  - Sedation back on overnight. Will need to wean sedation for neruoprognostication - Continue serial neurologic exams.  - Suspect will need neuro consult for prognostication.   9. ID: No recurrent fever overnight. PCT trended down to 0.8.  COVID+ but also with suspected aspiration PNA/pneumonitis.  - Continue Unasyn per CCM.   CRITICAL CARE Performed by: Anna Genre N   Total critical care time: 20 minutes  Critical care time was exclusive of separately billable procedures and treating other patients.  Critical care was necessary to treat or prevent imminent or life-threatening deterioration.  Critical care was time spent personally by me on the following activities: development of treatment plan with patient and/or surrogate as well as nursing, discussions with consultants, evaluation of patient's response  to treatment, examination of patient, obtaining history from patient or surrogate, ordering and performing treatments and interventions, ordering and review of laboratory studies, ordering and review of radiographic studies, pulse oximetry and re-evaluation of patient's condition.    Length of Stay: 3  FINCH, LINDSAY N, PA-C  04/05/2023, 6:53 AM  Advanced Heart Failure Team Pager [phone] (M-F; 7a - 5p)  Please contact [facility] for night-coverage after hours (5p -7a ) and weekends on [email or web address]   Patient seen with PA, I formulated plan and agree with the above note.   Patient remains intubated.  Sedation restarted overnight, now off again.  She makes no purposeful response to stimulation this morning.  She has a gag reflex.   She is on clevidipine 8 with stable BP.  Co-ox 67%, creatinine stable.  Good diuresis with IV Lasix yesterday, CVP 8 on my read.   General: on vent Neck: No JVD, no thyromegaly or thyroid nodule.  Lungs: Decreased at bases.  CV: Nondisplaced PMI.  Heart regular S1/S2, no S3/S4, no murmur.  No peripheral edema.   Abdomen: Soft, nontender, no hepatosplenomegaly, no distention.  Skin: Intact without lesions or rashes.  Neurologic: No purposeful response.  Extremities: No clubbing or cyanosis.  HEENT: Normal.   She has  had heparin for 48 hrs for ?NSTEMI vs demand from prolonged shock/ACS.  Transition to DVT prophylaxis.  Will need coronary angiography if she has neurologic recovery.   Echo with EF 40-45%, CVP 8 today with IV Lasix yesterday.  Continue clevidipine for BP control and will give Lasix 40 mg IV x 1 today.   Off sedation just this morning, so far no purposeful response.  Will need MRI head later today if no improvement.  Worry for anoxic encephalopathy.   CRITICAL CARE Performed by: Marca Ancona  Total critical care time: 35 minutes  Critical care time was exclusive of separately billable procedures and treating other patients.  Critical care was necessary to treat or prevent imminent or life-threatening deterioration.  Critical care was time spent personally by me on the following activities: development of treatment plan with patient and/or surrogate as well as nursing, discussions with consultants, evaluation of patient's response to treatment, examination of patient, obtaining history from patient or surrogate, ordering and performing treatments and interventions, ordering and review of laboratory studies, ordering and review of radiographic studies, pulse oximetry and re-evaluation of patient's condition.  Marca Ancona 04/05/2023 9:19 AM

## 2023-04-05 NOTE — Plan of Care (Signed)
  Problem: Fluid Volume: Goal: Ability to maintain a balanced intake and output will improve Outcome: Progressing   Problem: Nutritional: Goal: Maintenance of adequate nutrition will improve Outcome: Progressing Goal: Progress toward achieving an optimal weight will improve Outcome: Progressing   Problem: Skin Integrity: Goal: Risk for impaired skin integrity will decrease Outcome: Progressing   Problem: Tissue Perfusion: Goal: Adequacy of tissue perfusion will improve Outcome: Progressing   Problem: Cardiac: Goal: Ability to achieve and maintain adequate cardiopulmonary perfusion will improve Outcome: Progressing   Problem: Skin Integrity: Goal: Risk for impaired skin integrity will be minimized. Outcome: Progressing   Problem: Coping: Goal: Psychosocial and spiritual needs will be supported Outcome: Progressing   Problem: Respiratory: Goal: Will maintain a patent airway Outcome: Progressing Goal: Complications related to the disease process, condition or treatment will be avoided or minimized Outcome: Progressing   Problem: Education: Goal: Knowledge of General Education information will improve Description: Including pain rating scale, medication(s)/side effects and non-pharmacologic comfort measures Outcome: Progressing   Problem: Clinical Measurements: Goal: Ability to maintain clinical measurements within normal limits will improve Outcome: Progressing Goal: Will remain free from infection Outcome: Progressing Goal: Diagnostic test results will improve Outcome: Progressing Goal: Respiratory complications will improve Outcome: Progressing Goal: Cardiovascular complication will be avoided Outcome: Progressing   Problem: Nutrition: Goal: Adequate nutrition will be maintained Outcome: Progressing   Problem: Coping: Goal: Level of anxiety will decrease Outcome: Progressing   Problem: Pain Managment: Goal: General experience of comfort will improve and/or  be controlled Outcome: Progressing   Problem: Safety: Goal: Ability to remain free from injury will improve Outcome: Progressing   Problem: Skin Integrity: Goal: Risk for impaired skin integrity will decrease Outcome: Progressing   Problem: Cardiac: Goal: Ability to maintain an adequate cardiac output will improve Outcome: Progressing   Problem: Fluid Volume: Goal: Ability to achieve a balanced intake and output will improve Outcome: Progressing   Problem: Nutritional: Goal: Maintenance of adequate nutrition will improve Outcome: Progressing Goal: Maintenance of adequate weight for body size and type will improve Outcome: Progressing   Problem: Respiratory: Goal: Will regain and/or maintain adequate ventilation Outcome: Progressing   Problem: Urinary Elimination: Goal: Ability to achieve and maintain adequate renal perfusion and functioning will improve Outcome: Progressing

## 2023-04-05 NOTE — Progress Notes (Signed)
NAME:  Vickie Little, MRN:  [id number], DOB:  May 25, 1965, LOS: 3 ADMISSION DATE:  04/02/2023, CONSULTATION DATE:  04/02/2023 REFERRING MD:  Denton Lank - EDP, CHIEF COMPLAINT: Cardiac arrest    History of Present Illness:  58 year old female patient with history of metabolic syndrome, and coronary artery disease followed by cardiology last seen in January 2025.  Was at church singing in choir, had a witnessed collapse.  EMS was called on fire arrival the patient was agonal with weak pulses. On EMS arrival the patient was pulseless CPR was initiated, initial rhythm VF, ACLS interventions included epinephrine x 2, defibrillation x 5, amiodarone 300 mg.  An IO was placed in the right humerus she was intubated return of spontaneous circulation estimated at 15 minutes On arrival to [facility] initial EKG negative for ST elevation white blood cell count 11.7 glucose 408 post CPR lactic acid 10 Cardiology consulted by EDP Critical care asked to admit.  Pertinent Medical History:  Coronary artery disease, with prior angina seen by cardiology last in January 2025 type 2 diabetes with retinopathy, last A1c of record 9.5, hypertension, hyperlipidemia Remote herniated disc Obesity  Significant Hospital Events: Including procedures, antibiotic start and stop dates in addition to other pertinent events   2/9 Admitted status post VF arrest time to return of spontaneous circulation estimated at 15 minutes hemodynamically stable on arrival to the ER decorticate posturing 2/10: con't TTM, started on cleviprex  2/11: no change in mental status, needs off sedation for neuro eval 2/12: tachypnea/dysynchrony overnight on vent requiring prop back on at 15. On ttm overnight. Still on cleviprex   Interim History / Subjective:  No significant events overnight. Per nursing was dysynchronous and tachypneic on the vent, requiring her to go back on propofol to 15. Was turned off on AM round for neuro evaluation. MRI @  midnight to help with neuroprognostication   Objective:  Blood pressure (!) 143/119, pulse (!) 103, temperature 98.6 F (37 C), resp. rate (!) 23, height 5\' 7"  (1.702 m), weight 88.5 kg, last menstrual period 03/25/2016, SpO2 93%. CVP:  [7 mmHg-27 mmHg] 17 mmHg  Vent Mode: PRVC FiO2 (%):  [40 %] 40 % Set Rate:  [18 bmp] 18 bmp Vt Set:  [490 mL] 490 mL PEEP:  [5 cmH20] 5 cmH20 Plateau Pressure:  [14 cmH20-21 cmH20] 14 cmH20   Intake/Output Summary (Last 24 hours) at 04/05/2023 0724 Last data filed at 04/05/2023 0700 Gross per 24 hour  Intake 3154.86 ml  Output 4805 ml  Net -1650.14 ml   Filed Weights   04/03/23 0500 04/04/23 0500 04/05/23 0500  Weight: 88.5 kg 93.9 kg 88.5 kg   Physical Examination: General: acutely ill appearing middle aged female, laying in bed, no acute distress  HEENT: pupils 3mm ERRLA, mmm, anicteric sclera, ETT, OGT Neuro:  intubated  Does not respond to verbal, tactile or noxious stimuli. Not following commands. Eyes flicker non-purposefully, do not open or track. No withdrawal to painful stimulus in all 4 extremities. +Corneal, +Cough, and +Gag  CV: RRR, no m/g/r. PULM: vented, coarse lung sounds bilaterally. No vent dysynchrony. +gag/cough. Minimal settings GI: rounded, soft, ntnd Extremities: no significant pitting edema Skin: warm/dry/intact, TTM blanket on   Resolved Hospital Problem List:  Lactic acidosis Status post cardiac arrest shock Assessment & Plan:  Status post VF arrest with known history of coronary artery disease Acute HFrEF, concern for ischemia vs post-arrest  Echo 2/10 with EF 40-45%, global hypokinesis, mild LVH, G1DD, trivial MR. - con't  TTM protocol - normothermia  - pressors stopped 2/12 AM, can restart for MAP goal >65 - Lasix 40mg  x1 today, diuresing well  - con't amiodarone  - con't asa, statin, zetia  - heparin gtt was transitioned to lovenox ppx - Cardiology following, appreciate recommendations - Ischemia eval pending  clinical trajectory/neurologic recovery  Acute respiratory failure following cardiopulmonary arrest COVID-19 positive Intubated in the field, at risk for aspiration. ETT exchange completed 2/9PM. Presumed aspiration pneumonitis vs pneumonia. Started on unasyn and procal continues to downtrend.  - Continue full vent support (4-8cc/kg IBW) - Wean FiO2 for O2 sat > 90% - Daily WUA/SBT once appropriate from a neurologic/mental status standpoint - VAP and PAD protocol ordered. Now off sedation for neurological evaluation  - Pulmonary hygiene - resp culture with GPC, no speciation yet - con't unasyn for presumed aspiration - stop 2/13 for total of 5 days therapy  Acute metabolic encephalopathy status postcardiac arrest concerning for anoxic brain injury CT Head 2/9 NAICA. No significant improvement in mental status when off sedation.  - MRI brain @ midnight 2/13 for eval as this will be 72hrs  - limit sedation - keep off unless awake and needed  - Neuroprotective measures: HOB > 30 degrees, normoglycemia, normothermia, electrolytes WNL  Diabetes with hyperglycemia - cbg q4h  - SSI coverage  - increase TF coverage 2->4U q4h  - increase semglee to 15U BID - Goal CBG 140-180  Hypertension  - SBP goal <140 - cleviprex as needed  - avoid BB with low EF   Best Practice (right click and "Reselect all SmartList Selections" daily)   Diet/type: tubefeeds and NPO DVT prophylaxis LMWH Pressure ulcer(s): N/A GI prophylaxis: H2B Lines: Central line, Arterial Line, and yes and it is still needed Foley:  Yes, and it is still needed Code Status:  full code Last date of multidisciplinary goals of care discussion: updated husband at bedside this AM  Critical care time: 52   Cristopher Peru, PA-C [facility] 04/05/23 10:09 AM  Please see [email or web address] for pager details.  From 7A-7P if no response, please call [phone] After hours, please call ELink [phone]

## 2023-04-05 NOTE — Progress Notes (Signed)
Pharmacy Antibiotic Note  Vickie Little is a 58 y.o. female admitted on 04/02/2023 presenting post-arrest, concern for aspiration. Pharmacy has been consulted for Unasyn dosing. Pt also COVID positive. Renal function stable.  Plan: Unasyn 3g IV q6h Monitor renal function, clinical response and LOT  Height: 5\' 7"  (170.2 cm) Weight: 88.5 kg (195 lb 1.7 oz) (TTM pads on) IBW/kg (Calculated) : 61.6  Temp (24hrs), Avg:98.8 F (37.1 C), Min:98.2 F (36.8 C), Max:100.2 F (37.9 C)  Recent Labs  Lab 04/02/23 1249 04/02/23 1307 04/02/23 1308 04/02/23 1558 04/02/23 1736 04/02/23 2017 04/02/23 2152 04/03/23 0114 04/03/23 0544 04/03/23 0739 04/04/23 0418 04/05/23 0523  WBC 11.7*  --   --   --   --   --   --   --  11.7*  --  14.2* 15.2*  CREATININE 1.14*   < >  --   --   --   --    < > 0.75 0.66 0.69 0.73 0.78  LATICACIDVEN  --   --  10.0* 7.0* 5.1* 3.0*  --   --   --   --   --   --    < > = values in this interval not displayed.    Estimated Creatinine Clearance: 90.8 mL/min (by C-G formula based on SCr of 0.78 mg/dL).    No Known Allergies   Fredonia Highland, PharmD, BCPS, [facility] Clinical Pharmacist [phone] Please check AMION for all [facility] numbers 04/05/2023

## 2023-04-06 ENCOUNTER — Inpatient Hospital Stay (HOSPITAL_COMMUNITY): Payer: Managed Care, Other (non HMO)

## 2023-04-06 DIAGNOSIS — U071 COVID-19: Secondary | ICD-10-CM | POA: Diagnosis not present

## 2023-04-06 DIAGNOSIS — J690 Pneumonitis due to inhalation of food and vomit: Secondary | ICD-10-CM

## 2023-04-06 DIAGNOSIS — I469 Cardiac arrest, cause unspecified: Secondary | ICD-10-CM | POA: Diagnosis not present

## 2023-04-06 DIAGNOSIS — R570 Cardiogenic shock: Secondary | ICD-10-CM | POA: Diagnosis not present

## 2023-04-06 DIAGNOSIS — I2510 Atherosclerotic heart disease of native coronary artery without angina pectoris: Secondary | ICD-10-CM | POA: Diagnosis not present

## 2023-04-06 DIAGNOSIS — J9600 Acute respiratory failure, unspecified whether with hypoxia or hypercapnia: Secondary | ICD-10-CM | POA: Diagnosis not present

## 2023-04-06 LAB — POCT I-STAT 7, (LYTES, BLD GAS, ICA,H+H)
Acid-Base Excess: 0 mmol/L (ref 0.0–2.0)
Acid-Base Excess: 0 mmol/L (ref 0.0–2.0)
Acid-base deficit: 1 mmol/L (ref 0.0–2.0)
Acid-base deficit: 1 mmol/L (ref 0.0–2.0)
Bicarbonate: 21.4 mmol/L (ref 20.0–28.0)
Bicarbonate: 23.6 mmol/L (ref 20.0–28.0)
Bicarbonate: 22.2 mmol/L (ref 20.0–28.0)
Bicarbonate: 23.4 mmol/L (ref 20.0–28.0)
Calcium, Ion: 1.24 mmol/L (ref 1.15–1.40)
Calcium, Ion: 1.24 mmol/L (ref 1.15–1.40)
Calcium, Ion: 1.21 mmol/L (ref 1.15–1.40)
Calcium, Ion: 1.21 mmol/L (ref 1.15–1.40)
HCT: 37 % (ref 36.0–46.0)
HCT: 39 % (ref 36.0–46.0)
HCT: 35 % — ABNORMAL LOW (ref 36.0–46.0)
HCT: 33 % — ABNORMAL LOW (ref 36.0–46.0)
Hemoglobin: 12.6 g/dL (ref 12.0–15.0)
Hemoglobin: 13.3 g/dL (ref 12.0–15.0)
Hemoglobin: 11.9 g/dL — ABNORMAL LOW (ref 12.0–15.0)
Hemoglobin: 11.2 g/dL — ABNORMAL LOW (ref 12.0–15.0)
O2 Saturation: 82 %
O2 Saturation: 86 %
O2 Saturation: 81 %
O2 Saturation: 100 %
Patient temperature: 38
Patient temperature: 36.5
Patient temperature: 36.8
Patient temperature: 36
Potassium: 3.6 mmol/L (ref 3.5–5.1)
Potassium: 3.6 mmol/L (ref 3.5–5.1)
Potassium: 3.7 mmol/L (ref 3.5–5.1)
Potassium: 3.4 mmol/L — ABNORMAL LOW (ref 3.5–5.1)
Sodium: 148 mmol/L — ABNORMAL HIGH (ref 135–145)
Sodium: 149 mmol/L — ABNORMAL HIGH (ref 135–145)
Sodium: 149 mmol/L — ABNORMAL HIGH (ref 135–145)
Sodium: 149 mmol/L — ABNORMAL HIGH (ref 135–145)
TCO2: 22 mmol/L (ref 22–32)
TCO2: 25 mmol/L (ref 22–32)
TCO2: 23 mmol/L (ref 22–32)
TCO2: 24 mmol/L (ref 22–32)
pCO2 arterial: 30.1 mm[Hg] — ABNORMAL LOW (ref 32–48)
pCO2 arterial: 33.7 mm[Hg] (ref 32–48)
pCO2 arterial: 30.3 mm[Hg] — ABNORMAL LOW (ref 32–48)
pCO2 arterial: 33 mm[Hg] (ref 32–48)
pH, Arterial: 7.463 — ABNORMAL HIGH (ref 7.35–7.45)
pH, Arterial: 7.451 — ABNORMAL HIGH (ref 7.35–7.45)
pH, Arterial: 7.472 — ABNORMAL HIGH (ref 7.35–7.45)
pH, Arterial: 7.454 — ABNORMAL HIGH (ref 7.35–7.45)
pO2, Arterial: 46 mm[Hg] — ABNORMAL LOW (ref 83–108)
pO2, Arterial: 47 mm[Hg] — ABNORMAL LOW (ref 83–108)
pO2, Arterial: 41 mm[Hg] — ABNORMAL LOW (ref 83–108)
pO2, Arterial: 178 mm[Hg] — ABNORMAL HIGH (ref 83–108)

## 2023-04-06 LAB — HEPATIC FUNCTION PANEL
ALT: 102 U/L — ABNORMAL HIGH (ref 0–44)
AST: 92 U/L — ABNORMAL HIGH (ref 15–41)
Albumin: 2.8 g/dL — ABNORMAL LOW (ref 3.5–5.0)
Alkaline Phosphatase: 338 U/L — ABNORMAL HIGH (ref 38–126)
Bilirubin, Direct: 0.2 mg/dL (ref 0.0–0.2)
Indirect Bilirubin: 0.6 mg/dL (ref 0.3–0.9)
Total Bilirubin: 0.8 mg/dL (ref 0.0–1.2)
Total Protein: 6.6 g/dL (ref 6.5–8.1)

## 2023-04-06 LAB — LACTIC ACID, PLASMA: Lactic Acid, Venous: 2.4 mmol/L (ref 0.5–1.9)

## 2023-04-06 LAB — COOXEMETRY PANEL
Carboxyhemoglobin: 1.1 % (ref 0.5–1.5)
Carboxyhemoglobin: 1.6 % — ABNORMAL HIGH (ref 0.5–1.5)
Carboxyhemoglobin: 1 % (ref 0.5–1.5)
Methemoglobin: <0.7 % (ref 0.0–1.5)
Methemoglobin: <0.7 % (ref 0.0–1.5)
Methemoglobin: 1 % (ref 0.0–1.5)
O2 Saturation: 45.4 %
O2 Saturation: 88.4 %
O2 Saturation: 89.9 %
Total hemoglobin: 12.8 g/dL (ref 12.0–16.0)
Total hemoglobin: 11.6 g/dL — ABNORMAL LOW (ref 12.0–16.0)
Total hemoglobin: 11.6 g/dL — ABNORMAL LOW (ref 12.0–16.0)

## 2023-04-06 LAB — GLUCOSE, CAPILLARY
Glucose-Capillary: 137 mg/dL — ABNORMAL HIGH (ref 70–99)
Glucose-Capillary: 198 mg/dL — ABNORMAL HIGH (ref 70–99)
Glucose-Capillary: 199 mg/dL — ABNORMAL HIGH (ref 70–99)
Glucose-Capillary: 222 mg/dL — ABNORMAL HIGH (ref 70–99)
Glucose-Capillary: 299 mg/dL — ABNORMAL HIGH (ref 70–99)
Glucose-Capillary: 312 mg/dL — ABNORMAL HIGH (ref 70–99)

## 2023-04-06 LAB — CBC
HCT: 38.8 % (ref 36.0–46.0)
Hemoglobin: 12.5 g/dL (ref 12.0–15.0)
MCH: 25.9 pg — ABNORMAL LOW (ref 26.0–34.0)
MCHC: 32.2 g/dL (ref 30.0–36.0)
MCV: 80.3 fL (ref 80.0–100.0)
Platelets: 241 10*3/uL (ref 150–400)
RBC: 4.83 MIL/uL (ref 3.87–5.11)
RDW: 14.7 % (ref 11.5–15.5)
WBC: 14.4 10*3/uL — ABNORMAL HIGH (ref 4.0–10.5)
nRBC: 0.5 % — ABNORMAL HIGH (ref 0.0–0.2)

## 2023-04-06 LAB — PHOSPHORUS: Phosphorus: 3.5 mg/dL (ref 2.5–4.6)

## 2023-04-06 LAB — BASIC METABOLIC PANEL
Anion gap: 19 — ABNORMAL HIGH (ref 5–15)
BUN: 29 mg/dL — ABNORMAL HIGH (ref 6–20)
CO2: 20 mmol/L — ABNORMAL LOW (ref 22–32)
Calcium: 10 mg/dL (ref 8.9–10.3)
Chloride: 110 mmol/L (ref 98–111)
Creatinine, Ser: 0.99 mg/dL (ref 0.44–1.00)
GFR, Estimated: >60 mL/min (ref >60)
Glucose, Bld: 268 mg/dL — ABNORMAL HIGH (ref 70–99)
Potassium: 3.6 mmol/L (ref 3.5–5.1)
Sodium: 149 mmol/L — ABNORMAL HIGH (ref 135–145)

## 2023-04-06 LAB — MAGNESIUM: Magnesium: 2.3 mg/dL (ref 1.7–2.4)

## 2023-04-06 LAB — TRIGLYCERIDES: Triglycerides: 75 mg/dL (ref <150)

## 2023-04-06 MED ORDER — DOCUSATE SODIUM 50 MG/5ML PO LIQD
100.0000 mg | Freq: Two times a day (BID) | ORAL | Status: DC
  Administered 2023-04-06 – 2023-04-16 (×10): 100 mg
  Filled 2023-04-06 (×13): qty 10

## 2023-04-06 MED ORDER — NOREPINEPHRINE 4 MG/250ML-% IV SOLN: 0.0000 ug/min | INTRAVENOUS | Status: DC

## 2023-04-06 MED ORDER — ROCURONIUM BROMIDE 10 MG/ML (PF) SYRINGE: 100.0000 mg | PREFILLED_SYRINGE | Freq: Once | INTRAVENOUS | Status: AC

## 2023-04-06 MED ORDER — FREE WATER
100.0000 mL | Status: DC
  Administered 2023-04-06 – 2023-04-13 (×66): 100 mL

## 2023-04-06 MED ORDER — SODIUM CHLORIDE 0.9 % IV SOLN: 250.0000 mL | INTRAVENOUS | Status: AC

## 2023-04-06 MED ORDER — PIPERACILLIN-TAZOBACTAM 3.375 G IVPB
3.3750 g | Freq: Three times a day (TID) | INTRAVENOUS | Status: DC
  Administered 2023-04-06 – 2023-04-07 (×4): 3.375 g via INTRAVENOUS
  Filled 2023-04-06 (×4): qty 50

## 2023-04-06 MED ORDER — ROCURONIUM BROMIDE 10 MG/ML (PF) SYRINGE
PREFILLED_SYRINGE | INTRAVENOUS | Status: AC
  Administered 2023-04-06: 100 mg via INTRAVENOUS
  Filled 2023-04-06: qty 10

## 2023-04-06 MED ORDER — ALBUMIN HUMAN 25 % IV SOLN
25.0000 g | Freq: Once | INTRAVENOUS | Status: AC
  Administered 2023-04-06: 25 g via INTRAVENOUS
  Filled 2023-04-06: qty 100

## 2023-04-06 MED ORDER — POTASSIUM CHLORIDE CRYS ER 20 MEQ PO TBCR: 40.0000 meq | EXTENDED_RELEASE_TABLET | ORAL | Status: DC

## 2023-04-06 MED ORDER — FENTANYL 2500MCG IN NS 250ML (10MCG/ML) PREMIX INFUSION
50.0000 ug/h | INTRAVENOUS | Status: DC
  Administered 2023-04-06 – 2023-04-07 (×2): 50 ug/h via INTRAVENOUS
  Filled 2023-04-06 (×3): qty 250

## 2023-04-06 MED ORDER — HYDROCORTISONE SOD SUC (PF) 100 MG IJ SOLR
100.0000 mg | Freq: Two times a day (BID) | INTRAMUSCULAR | Status: DC
  Administered 2023-04-06 – 2023-04-08 (×5): 100 mg via INTRAVENOUS
  Filled 2023-04-06 (×5): qty 2

## 2023-04-06 MED ORDER — INSULIN ASPART 100 UNIT/ML IJ SOLN
0.0000 [IU] | INTRAMUSCULAR | Status: DC
  Administered 2023-04-06: 7 [IU] via SUBCUTANEOUS
  Administered 2023-04-07 (×2): 4 [IU] via SUBCUTANEOUS
  Administered 2023-04-07 (×2): 3 [IU] via SUBCUTANEOUS
  Administered 2023-04-08 (×3): 4 [IU] via SUBCUTANEOUS
  Administered 2023-04-08: 7 [IU] via SUBCUTANEOUS
  Administered 2023-04-08: 4 [IU] via SUBCUTANEOUS
  Administered 2023-04-08 – 2023-04-09 (×2): 7 [IU] via SUBCUTANEOUS
  Administered 2023-04-09: 3 [IU] via SUBCUTANEOUS
  Administered 2023-04-09: 7 [IU] via SUBCUTANEOUS
  Administered 2023-04-09: 4 [IU] via SUBCUTANEOUS
  Administered 2023-04-09: 11 [IU] via SUBCUTANEOUS
  Administered 2023-04-10 (×2): 3 [IU] via SUBCUTANEOUS
  Administered 2023-04-10 (×2): 7 [IU] via SUBCUTANEOUS
  Administered 2023-04-10 – 2023-04-11 (×2): 4 [IU] via SUBCUTANEOUS
  Administered 2023-04-11 (×2): 3 [IU] via SUBCUTANEOUS
  Administered 2023-04-11 (×2): 4 [IU] via SUBCUTANEOUS
  Administered 2023-04-12 (×2): 3 [IU] via SUBCUTANEOUS
  Administered 2023-04-12: 15 [IU] via SUBCUTANEOUS
  Administered 2023-04-12: 4 [IU] via SUBCUTANEOUS
  Administered 2023-04-12: 7 [IU] via SUBCUTANEOUS
  Administered 2023-04-13: 20 [IU] via SUBCUTANEOUS
  Administered 2023-04-13 (×2): 15 [IU] via SUBCUTANEOUS
  Administered 2023-04-13 (×2): 11 [IU] via SUBCUTANEOUS
  Administered 2023-04-13: 15 [IU] via SUBCUTANEOUS
  Administered 2023-04-14 (×5): 7 [IU] via SUBCUTANEOUS
  Administered 2023-04-14: 11 [IU] via SUBCUTANEOUS
  Administered 2023-04-14: 7 [IU] via SUBCUTANEOUS
  Administered 2023-04-15 (×2): 4 [IU] via SUBCUTANEOUS
  Administered 2023-04-15 (×2): 3 [IU] via SUBCUTANEOUS
  Administered 2023-04-16 (×2): 4 [IU] via SUBCUTANEOUS
  Administered 2023-04-16: 3 [IU] via SUBCUTANEOUS
  Administered 2023-04-16: 4 [IU] via SUBCUTANEOUS
  Administered 2023-04-17: 7 [IU] via SUBCUTANEOUS
  Administered 2023-04-17 (×2): 4 [IU] via SUBCUTANEOUS
  Administered 2023-04-17: 7 [IU] via SUBCUTANEOUS
  Administered 2023-04-18: 3 [IU] via SUBCUTANEOUS
  Administered 2023-04-18: 4 [IU] via SUBCUTANEOUS
  Administered 2023-04-18: 3 [IU] via SUBCUTANEOUS

## 2023-04-06 MED ORDER — INSULIN GLARGINE-YFGN 100 UNIT/ML ~~LOC~~ SOLN
22.0000 [IU] | Freq: Two times a day (BID) | SUBCUTANEOUS | Status: DC
  Administered 2023-04-06 – 2023-04-10 (×9): 22 [IU] via SUBCUTANEOUS
  Filled 2023-04-06 (×12): qty 0.22

## 2023-04-06 MED ORDER — PROPOFOL 1000 MG/100ML IV EMUL
0.0000 ug/kg/min | INTRAVENOUS | Status: DC
  Administered 2023-04-06: 40 ug/kg/min via INTRAVENOUS
  Administered 2023-04-06: 5 ug/kg/min via INTRAVENOUS
  Administered 2023-04-06: 20 ug/kg/min via INTRAVENOUS
  Administered 2023-04-07 (×4): 40 ug/kg/min via INTRAVENOUS
  Administered 2023-04-07: 30 ug/kg/min via INTRAVENOUS
  Administered 2023-04-08 (×2): 40 ug/kg/min via INTRAVENOUS
  Filled 2023-04-06 (×10): qty 100

## 2023-04-06 MED ORDER — VANCOMYCIN HCL 1250 MG/250ML IV SOLN
1250.0000 mg | INTRAVENOUS | Status: DC
  Administered 2023-04-06 – 2023-04-07 (×2): 1250 mg via INTRAVENOUS
  Filled 2023-04-06 (×2): qty 250

## 2023-04-06 MED ORDER — FENTANYL CITRATE PF 50 MCG/ML IJ SOSY: 50.0000 ug | PREFILLED_SYRINGE | INTRAMUSCULAR | Status: DC | PRN

## 2023-04-06 MED ORDER — FENTANYL CITRATE PF 50 MCG/ML IJ SOSY
100.0000 ug | PREFILLED_SYRINGE | Freq: Once | INTRAMUSCULAR | Status: AC
  Administered 2023-04-06: 100 ug via INTRAVENOUS
  Filled 2023-04-06: qty 2

## 2023-04-06 MED ORDER — POLYETHYLENE GLYCOL 3350 17 G PO PACK
17.0000 g | PACK | Freq: Every day | ORAL | Status: DC
  Administered 2023-04-07 – 2023-04-09 (×3): 17 g
  Filled 2023-04-06 (×3): qty 1

## 2023-04-06 MED ORDER — POTASSIUM CHLORIDE 20 MEQ PO PACK
40.0000 meq | PACK | ORAL | Status: AC
  Administered 2023-04-06 (×2): 40 meq
  Filled 2023-04-06 (×2): qty 2

## 2023-04-06 MED ORDER — MILRINONE LACTATE IN DEXTROSE 20-5 MG/100ML-% IV SOLN
0.3750 ug/kg/min | INTRAVENOUS | Status: DC
  Administered 2023-04-06 (×2): 0.25 ug/kg/min via INTRAVENOUS
  Administered 2023-04-07 – 2023-04-09 (×7): 0.375 ug/kg/min via INTRAVENOUS
  Filled 2023-04-06 (×9): qty 100

## 2023-04-06 MED ORDER — POTASSIUM CHLORIDE 20 MEQ PO PACK
40.0000 meq | PACK | Freq: Once | ORAL | Status: AC
  Administered 2023-04-06: 40 meq
  Filled 2023-04-06: qty 2

## 2023-04-06 MED ORDER — FENTANYL CITRATE PF 50 MCG/ML IJ SOSY
50.0000 ug | PREFILLED_SYRINGE | INTRAMUSCULAR | Status: DC | PRN
  Administered 2023-04-06: 100 ug via INTRAVENOUS
  Administered 2023-04-06 – 2023-04-07 (×3): 50 ug via INTRAVENOUS
  Administered 2023-04-07: 100 ug via INTRAVENOUS
  Filled 2023-04-06: qty 2

## 2023-04-06 MED ORDER — FUROSEMIDE 10 MG/ML IJ SOLN
80.0000 mg | Freq: Two times a day (BID) | INTRAMUSCULAR | Status: AC
  Administered 2023-04-06 (×2): 80 mg via INTRAVENOUS
  Filled 2023-04-06 (×2): qty 8

## 2023-04-06 NOTE — Progress Notes (Signed)
eLink Physician-Brief Progress Note Patient Name: Vickie Little DOB: 1965/10/19 MRN: [id number]   Date of Service  04/06/2023  HPI/Events of Note  Patient made completely n.p.o. yesterday suspected aspiration worsening ARDS.  Sodium remains in the high 140s.  eICU Interventions  Maintain free water flushes as ordered.     Intervention Category Minor Interventions: Routine modifications to care plan (e.g. PRN medications for pain, fever)  Maicee Ullman 04/06/2023, 11:58 PM

## 2023-04-06 NOTE — Progress Notes (Signed)
RT called to pt room due to sats being in the low 80s. RT and RN changed pulse ox to multiple spots to get an accurate reading but sats still low 80s. RT took pt off ventilator and bag lavaged pt with peep valve and suction. Pt sats come back up to 87%. Pt placed back on ventilator and RT obtained an ABG to comfirm O2 saturation, which showed 81%. RT then took pt back off ventilator, bagged, gave a PRN tx, while waiting for CCM to come to bedside. Once CCM was at bedside vent changes were made and pt was given paralytics. Pt sats now 97%.

## 2023-04-06 NOTE — Plan of Care (Signed)
  Problem: Fluid Volume: Goal: Ability to maintain a balanced intake and output will improve Outcome: Progressing   Problem: Metabolic: Goal: Ability to maintain appropriate glucose levels will improve Outcome: Progressing   Problem: Nutritional: Goal: Maintenance of adequate nutrition will improve Outcome: Progressing   Problem: Skin Integrity: Goal: Risk for impaired skin integrity will decrease Outcome: Progressing   Problem: Tissue Perfusion: Goal: Adequacy of tissue perfusion will improve Outcome: Progressing   Problem: Education: Goal: Ability to manage disease process will improve Outcome: Progressing   Problem: Skin Integrity: Goal: Risk for impaired skin integrity will be minimized. Outcome: Progressing   Problem: Clinical Measurements: Goal: Will remain free from infection Outcome: Progressing Goal: Diagnostic test results will improve Outcome: Progressing Goal: Cardiovascular complication will be avoided Outcome: Progressing   Problem: Nutrition: Goal: Adequate nutrition will be maintained Outcome: Progressing   Problem: Elimination: Goal: Will not experience complications related to bowel motility Outcome: Progressing   Problem: Pain Managment: Goal: General experience of comfort will improve and/or be controlled Outcome: Progressing   Problem: Respiratory: Goal: Will regain and/or maintain adequate ventilation Outcome: Progressing

## 2023-04-06 NOTE — Progress Notes (Signed)
NAME:  Vickie Little, MRN:  [id number], DOB:  11-02-1965, LOS: 4 ADMISSION DATE:  04/02/2023, CONSULTATION DATE:  04/02/2023 REFERRING MD:  Denton Lank - EDP, CHIEF COMPLAINT: Cardiac arrest    History of Present Illness:  58 year old female patient with history of metabolic syndrome, and coronary artery disease followed by cardiology last seen in January 2025.  Was at church singing in choir, had a witnessed collapse.  EMS was called on fire arrival the patient was agonal with weak pulses. On EMS arrival the patient was pulseless CPR was initiated, initial rhythm VF, ACLS interventions included epinephrine x 2, defibrillation x 5, amiodarone 300 mg.  An IO was placed in the right humerus she was intubated return of spontaneous circulation estimated at 15 minutes On arrival to [facility] initial EKG negative for ST elevation white blood cell count 11.7 glucose 408 post CPR lactic acid 10 Cardiology consulted by EDP Critical care asked to admit.  Pertinent Medical History:  Coronary artery disease, with prior angina seen by cardiology last in January 2025 type 2 diabetes with retinopathy, last A1c of record 9.5, hypertension, hyperlipidemia Remote herniated disc Obesity  Significant Hospital Events: Including procedures, antibiotic start and stop dates in addition to other pertinent events   2/9 Admitted status post VF arrest time to return of spontaneous circulation estimated at 15 minutes hemodynamically stable on arrival to the ER decorticate posturing 2/10: con't TTM, started on cleviprex  2/11: no change in mental status, needs off sedation for neuro eval 2/12: tachypnea/dysynchrony overnight on vent requiring prop back on at 15. On ttm overnight. Still on cleviprex  2/13: increased vent requirements overnight. Had mucous plug lavaged out. MRI equivocal for anoxic injury. Broadening abx, starting milrinone, lasix, steroids, intermittent paralytic.   Interim History / Subjective:   Overnight increasing vent requirements.  Had several lavage attempts with mucous plug and pink frothy secretions.  Also tachypneic although not dyssynchronous on the vent.  Had MRI which is equivocal for anoxic injury.  On a.m. rounds, ongoing tachypnea, hypoxia with SpO2 83%, Co. ox 45 although likely related to desaturations.  Chest x-ray with what looks like edema rather than ARDS.  She also had vomiting yesterday which may be a component of this with aspiration pneumonitis.  Broadening antibiotics to vanc/Zosyn, starting stress dose steroids, milrinone for Fick of 1.8, Lasix.  Requiring intermittent paralytic push for vent compliance.  Objective:  Blood pressure (!) 120/95, pulse (!) 109, temperature 98.2 F (36.8 C), resp. rate 20, height 5\' 7"  (1.702 m), weight 89.8 kg, last menstrual period 03/25/2016, SpO2 (!) 89%. CVP:  [4 mmHg-32 mmHg] 16 mmHg  Vent Mode: PRVC FiO2 (%):  [40 %-100 %] 100 % Set Rate:  [18 bmp] 18 bmp Vt Set:  [490 mL] 490 mL PEEP:  [5 cmH20-10 cmH20] 10 cmH20 Pressure Support:  [5 cmH20] 5 cmH20 Plateau Pressure:  [17 cmH20-22 cmH20] 20 cmH20   Intake/Output Summary (Last 24 hours) at 04/06/2023 0738 Last data filed at 04/06/2023 0700 Gross per 24 hour  Intake 2559.19 ml  Output 3700 ml  Net -1140.81 ml   Filed Weights   04/04/23 0500 04/05/23 0500 04/06/23 0500  Weight: 93.9 kg 88.5 kg 89.8 kg   Physical Examination: General: critically ill appearing, laying in bed, vented, sedated, tachypneic  HEENT: pupils 3mm ERRLA, mmm, anicteric sclera, ETT, OGT Neuro: sedated, intubated. Prior to sedation start, seemed to open eyes to voice but not tracking, +gag/cough, does not withdraw to painful stimulus.  CV: s1s2,  tachycardia, no murmur, rub, gallop  PULM: vented 100% 12 PEEP, tachypneic, synchronous, thin secretions, coarse breath sounds but no wheezing or rales  GI: rounded, soft, ntnd Extremities: no significant pitting edema Skin: warm/dry/intact, TTM blanket  on   Resolved Hospital Problem List:  Lactic acidosis Status post cardiac arrest shock Assessment & Plan:  Status post VF arrest with known history of coronary artery disease Acute HFrEF, concern for ischemia vs post-arrest  Echo 2/10 with EF 40-45%, global hypokinesis, mild LVH, G1DD, trivial MR. 2/13: Coox 45, fick 1.8.  Do think component of coox is related to respiratory desaturation rather than heart failure; however, chest x-ray with pulmonary edema and having pink frothy sputum - con't TTM protocol - normothermia  - pressors stopped 2/12 AM, can restart for MAP goal >65 -Start milrinone 0.125 -Start Lasix 80 mg IV twice daily - repeat ABG/coox in 1hr from vent changes - con't amiodarone  - con't asa, statin, zetia  - heparin gtt was transitioned to lovenox ppx - Cardiology following, appreciate recommendations - Ischemia eval pending clinical trajectory/neurologic recovery  Acute respiratory failure following cardiopulmonary arrest COVID-19 positive Aspiration pneumonitis Intubated in the field, at risk for aspiration. ETT exchange completed 2/9PM. Presumed aspiration pneumonitis vs pneumonia. Started on unasyn and procal continues to downtrend. 2/13: Mucous plug overnight with tachypnea, increasing vent settings.  Frequent desaturations during AM rounds requiring BVM, increasing sedation, push dose paralytic - add solucortef 100mg  q12h  - broaden abx to vanc/zosyn  - prn rocuronium  - Continue full vent support (4-8cc/kg IBW) - Wean FiO2 for O2 sat > 90% - VAP and PAD protocol ordered. Propofol/fent. Will deepen sedation for PRN paralytics  - Pulmonary hygiene  Acute metabolic encephalopathy status postcardiac arrest concerning for anoxic brain injury CT Head 2/9 NAICA. No significant improvement in mental status when off sedation. MRI 2/13 equivocal - consult neuro with assistance with neuroprognostication. Will be limited now with sedation for above.  - Neuroprotective  measures: HOB > 30 degrees, normoglycemia, normothermia, electrolytes WNL  Diabetes with hyperglycemia - cbg q4h  - SSI coverage  - increase TF coverage 2->4U q4h  - increase semglee to 15U BID - Goal CBG 140-180  Hypertension  - SBP goal <140 - cleviprex as needed  - avoid BB with low EF   Best Practice (right click and "Reselect all SmartList Selections" daily)   Diet/type: tubefeeds and NPO DVT prophylaxis LMWH Pressure ulcer(s): N/A GI prophylaxis: H2B Lines: Central line, Arterial Line, and yes and it is still needed Foley:  Yes, and it is still needed Code Status:  full code Last date of multidisciplinary goals of care discussion: updated husband at bedside this AM  Critical care time: 66   Cristopher Peru, PA-C [facility] 04/06/23 7:38 AM  Please see [email or web address] for pager details.  From 7A-7P if no response, please call [phone] After hours, please call ELink [phone]

## 2023-04-06 NOTE — Inpatient Diabetes Management (Signed)
Inpatient Diabetes Program Recommendations  AACE/ADA: New Consensus Statement on Inpatient Glycemic Control (2015)  Target Ranges:  Prepandial:   less than 140 mg/dL      Peak postprandial:   less than 180 mg/dL (1-2 hours)      Critically ill patients:  140 - 180 mg/dL   Lab Results  Component Value Date   GLUCAP 299 (H) 04/06/2023   HGBA1C 9.2 (H) 04/02/2023    Review of Glycemic Control  Latest Reference Range & Units 04/05/23 11:44 04/05/23 16:47 04/05/23 20:07 04/05/23 23:50 04/06/23 04:08 04/06/23 07:30 04/06/23 11:28  Glucose-Capillary 70 - 99 mg/dL 413 (H) 244 (H) 010 (H) 243 (H) 198 (H) 199 (H) 299 (H)  (H): Data is abnormally high Diabetes history: DM  Outpatient Diabetes medications:  Farxiga 10 mg daily Amaryl 2 mg daily Metformin 1000 mg bid Ozempic 2 mg weekly Current orders for Inpatient glycemic control:  Novolog 0-15 units q 4 hours Novolog 4 units q 4 hours Semglee 15 units bid   Inpatient Diabetes Program Recommendations:    Consider increasing Semglee 18 units BID and Novolog 6 units Q4H (to be stopped or held in the event tube feeds stopped).  Thanks, Lujean Rave, MSN, RNC-OB Diabetes Coordinator [phone] (8a-5p)

## 2023-04-06 NOTE — Progress Notes (Addendum)
Advanced Heart Failure Rounding Note  Cardiologist: Tessa Lerner, DO  HF Cardiologist: Dr. Shirlee Latch  Chief Complaint: OOH VF arrest Subjective:    Required bag lavage overnight for decreased SpO2 and lung volumes and removal of clots. Remains hypoxic this am on exam SpO2 87%.  Brain MRI this morning with possible evidence for hypoxic ischemic injury.  Co-ox 45 with Fick CI of 1.8 L/min/m2. Remains of Cleviprex for BP control. CVP 17. 3.6L UOP with IV Lasix 40 mg. Net negative 1.2L  Remains intubated. Off sedation. No purposeful movement.  Objective:    Weight Range: 89.8 kg Body mass index is 31.01 kg/m.   Vital Signs:   Temp:  [97 F (36.1 C)-100.8 F (38.2 C)] 98.2 F (36.8 C) (02/13 0600) Pulse Rate:  [101-147] 111 (02/13 0600) Resp:  [19-49] 24 (02/13 0600) BP: (84-182)/(52-164) 125/79 (02/13 0315) SpO2:  [82 %-100 %] 89 % (02/13 0600) FiO2 (%):  [40 %-100 %] 100 % (02/13 0508) Weight:  [89.8 kg] 89.8 kg (02/13 0500) Last BM Date : 04/05/23  Weight change: Filed Weights   04/04/23 0500 04/05/23 0500 04/06/23 0500  Weight: 93.9 kg 88.5 kg 89.8 kg   Intake/Output:   Intake/Output Summary (Last 24 hours) at 04/06/2023 0654 Last data filed at 04/06/2023 0600 Gross per 24 hour  Intake 2520.01 ml  Output 3840 ml  Net -1319.99 ml    Physical Exam    CVP 17 General: Intubated. Shivering Cardiac: S1 and S2 present. No murmurs or rub. Extremities: Cool, dry.  No peripherial edema.  Neuro: Intubated. Off sedation. No purporseful movement Lines/Devices:  LIJ CVL, Foley, ETT, OGT, FMS, Radial Aline  Telemetry   ST in 110-120s (personally reviewed)  Labs    CBC Recent Labs    04/05/23 0523 04/06/23 0450 04/06/23 0517  WBC 15.2*  --  14.4*  HGB 12.6 12.6 12.5  HCT 38.1 37.0 38.8  MCV 80.5  --  80.3  PLT 238  --  241   Basic Metabolic Panel Recent Labs    60/45/40 0523 04/06/23 0450 04/06/23 0517  NA 142 148* 149*  K 4.0 3.6 3.6  CL 103  --   110  CO2 20*  --  20*  GLUCOSE 307*  --  268*  BUN 19  --  29*  CREATININE 0.78  --  0.99  CALCIUM 9.4  --  10.0  MG 2.3  --  2.3  PHOS 3.7  --  3.5   Liver Function Tests Recent Labs    04/05/23 0523 04/06/23 0517  AST 74* 92*  ALT 119* 102*  ALKPHOS 279* 338*  BILITOT 0.8 0.8  PROT 6.5 6.6  ALBUMIN 3.1* 2.8*   No results for input(s): "LIPASE", "AMYLASE" in the last 72 hours. Cardiac Enzymes No results for input(s): "CKTOTAL", "CKMB", "CKMBINDEX", "TROPONINI" in the last 72 hours.  BNP: BNP (last 3 results) No results for input(s): "BNP" in the last 8760 hours.  ProBNP (last 3 results) No results for input(s): "PROBNP" in the last 8760 hours.  D-Dimer No results for input(s): "DDIMER" in the last 72 hours. Hemoglobin A1C No results for input(s): "HGBA1C" in the last 72 hours.  Fasting Lipid Panel Recent Labs    04/06/23 0517  TRIG 75   Thyroid Function Tests No results for input(s): "TSH", "T4TOTAL", "T3FREE", "THYROIDAB" in the last 72 hours.  Invalid input(s): "FREET3"  Other results:  Imaging   MR BRAIN WO CONTRAST Result Date: 04/06/2023 CLINICAL DATA:  Anoxic  brain damage.  Post cardiac arrest. EXAM: MRI HEAD WITHOUT CONTRAST TECHNIQUE: Multiplanar, multiecho pulse sequences of the brain and surrounding structures were obtained without intravenous contrast. COMPARISON:  CT head April 02, 2023. FINDINGS: Brain: Subtle/equivocal restricted diffusion involving the high parasagittal frontal lobes and potentially the deep gray nuclei. No evidence of acute hemorrhage, mass lesion, midline shift or hydrocephalus. Vascular: Major arterial flow voids are maintained at the skull base. Skull and upper cervical spine: Normal marrow signal. Sinuses/Orbits: Mostly clear sinuses.  No acute orbital findings. Other: No mastoid effusions. IMPRESSION: 1. Subtle/equivocal restricted diffusion involving the high parasagittal frontal lobes and potentially the deep gray nuclei.  This could represent a hypoxic ischemic injury (given the clinical history) or artifact. A follow-up MRI could assess for change/persistence if clinically warranted. 2. No mass effect or other superimposed acute abnormality. Electronically Signed   By: Feliberto Harts M.D.   On: 04/06/2023 03:01   Medications:    Scheduled Medications:  aspirin  81 mg Per Tube Daily   atorvastatin  80 mg Per Tube Daily   Chlorhexidine Gluconate Cloth  6 each Topical Daily   enoxaparin (LOVENOX) injection  40 mg Subcutaneous Q24H   ezetimibe  10 mg Per Tube Daily   famotidine  20 mg Per Tube BID   feeding supplement (PROSource TF20)  60 mL Per Tube Daily   insulin aspart  0-15 Units Subcutaneous Q4H   insulin aspart  4 Units Subcutaneous Q4H   insulin glargine-yfgn  15 Units Subcutaneous BID   mouth rinse  15 mL Mouth Rinse Q2H    Infusions:  amiodarone 30 mg/hr (04/06/23 0600)   ampicillin-sulbactam (UNASYN) IV Stopped (04/06/23 0329)   clevidipine 4 mg/hr (04/06/23 0600)   feeding supplement (VITAL 1.5 CAL) 55 mL/hr at 04/06/23 0600   norepinephrine (LEVOPHED) Adult infusion Stopped (04/04/23 0813)    PRN Medications: acetaminophen **OR** acetaminophen (TYLENOL) oral liquid 160 mg/5 mL **OR** acetaminophen, dextrose, ipratropium-albuterol, ondansetron (ZOFRAN) IV, mouth rinse  Patient Profile   Vickie Little is a 58 year old with past medical history of NSTEMI in 2013 s/p PCI to LAD, CAD, hypertension, hyperlipidemia, type 2 diabetes, PAD, and neurogenic lumbar claudication, and obesity.   Admitted with OOH VF arrest.  Assessment/Plan   1. VF arrest: Witnessed arrest but was down an unclear amount of time pre-CPR.  When EMS arrived, had 15 minutes CPR + shock x 4 with ROSC.  Cause of arrest uncertain, no chest pain prior but TnI 9509.   - Continue amiodarone gtt 30/hr 2. CAD: History of CAD with PCI to LAD in 2013.  No chest pain prior to event but HS-TnI elevated 9509, ?demand ischemia  with prolonged shock vs ACS. ECG without STEMI, nonspecific changes.  - on Lovenox at DVT dose - atorvastatin 80 daily - ASA per tube - If she has neurologic recovery, will need coronary angiography.  3. Cardiogenic shock: Echo with EF 40-45%, normal RV.  Previous echo was in 2013 with normal EF.  - co-ox down to 45 from 67. Fick CI 1.8 L/min/m2. Did have respiratory event at 0200 requiring bag lavage for removal of clots. Hypoxia contributing to low co-ox, however suspect possible CGS with now CVP 17. - start Milrinone 0.25 mcg/kg/min - on Cleviprex for BP. - CVP 17. Lasix 80 mg IV bid 4. Elevated LFTs: Suspect shock liver.  - Improving - Follow CMET daily.  5. DKA: Poorly controlled DM2, A1c 10.9.  Beta hydroxybutyrate decreased to normal.  - Now on [place] insulin.  6. COVID+: Supportive care.  7. Acute hypoxemic respiratory failure: Unasyn for aspiration coverage.  Vent per CCM.  - hypoxic on vent this morning. required bag lavage for suction - sedation for vent compliance and oxygenation per CCM 8. 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  - brain MRI today showed possible hypoxic ischemic injury - need neuro consult for prognostication.  9. ID: No recurrent fever overnight. PCT trended down to 0.8.  COVID+ but also with suspected aspiration PNA/pneumonitis.  - Continue Unasyn per CCM.   Length of Stay: 4  CRITICAL CARE Performed by: [place] Lee  Total critical care time: 20 minutes  Critical care time was exclusive of separately billable procedures and treating other patients.  Critical care was necessary to treat or prevent imminent or life-threatening deterioration.  Critical care was time spent personally by me on the following activities: development of treatment plan with patient and/or surrogate as well as nursing, discussions with consultants, evaluation of patient's response to treatment, examination of patient, obtaining history from  patient or surrogate, ordering and performing treatments and interventions, ordering and review of laboratory studies, ordering and review of radiographic studies, pulse oximetry and re-evaluation of patient's condition.   [place] Lee, NP  04/06/2023, 6:54 AM  Advanced Heart Failure Team Pager [phone] (M-F; 7a - 5p)  Please contact [facility] for night-coverage after hours (5p -7a ) and weekends on [email or web address]   Patient seen NP, I formulated the plan and agree with the above note.   Head MRI yesterday equivocal for hypoxic injury.  No purposeful response with sedation wean.   She is hypoxemic this morning with oxygen saturation in 80s, hard to bag with noncompliant lungs, clot suctioned from lungs.  CXR with appearance of pulmonary edema.   Co-ox 45% this morning calculating to Fick CI 1.8, CVP 16-17.  She is on clevidipine 4 mg/hr with SBP in 120s.   I/Os net negative 1281 with Lasix 40 mg IV x 1 yesterday.   General: On vent Neck: JVP 14 cm, no thyromegaly or thyroid nodule.  Lungs: Decreased at bases bilaterally.  CV: Nondisplaced PMI.  Heart mildly tachy, regular S1/S2, no S3/S4, no murmur.  No peripheral edema.   Abdomen: Soft, nontender, no hepatosplenomegaly, no distention.  Skin: Intact without lesions or rashes.  Neurologic: No purposeful response Extremities: No clubbing or cyanosis.  HEENT: Normal.   Still not purposeful response, worried for anoxic encephalopathy.  MRI head is equivocal.  Unfortunately, we are going to have to restart sedation with difficulty with ventilation.   Acute hypoxemic respiratory failure, suspect in part due to pulmonary edema but wonder if there is some other process present such as aspiration as I/Os have been negative.  CVP 16-17 today.  - Will start milrinone 0.25 with low cardiac index 1.8 and will try to wean off clevidipine.  - Lasix 80 mg IV bid, dose now.  - ?benefit of bronchoscopy with clot suctioned up.  - Antibiotics  transitioned from Unasyn to vancomycin/Zosyn.   CRITICAL CARE Performed by: Marca Ancona  Total critical care time: 45 minutes  Critical care time was exclusive of separately billable procedures and treating other patients.  Critical care was necessary to treat or prevent imminent or life-threatening deterioration.  Critical care was time spent personally by me on the following activities: development of treatment plan with patient and/or surrogate as well as nursing, discussions with consultants, evaluation of patient's response to treatment, examination of patient, obtaining history from patient or  surrogate, ordering and performing treatments and interventions, ordering and review of laboratory studies, ordering and review of radiographic studies, pulse oximetry and re-evaluation of patient's condition.  Marca Ancona 04/06/2023 8:29 AM

## 2023-04-06 NOTE — Progress Notes (Signed)
Pt transported from 2H03 to MRI and back without complications.

## 2023-04-06 NOTE — Progress Notes (Signed)
   04/06/23 0026  Adult Ventilator Measurements  SpO2 94 %  Airway Suctioning/Secretions  Suction Type (S)  ETT (patient was manually bagged lavage due to decrese air exchange, decrease lung volumes, and inability to pass suction catheter)  Suction Device  Catheter  Secretion Amount Copious  Secretion Color (S)  Pink tinged;Clear (some old bloody clots noted)  Secretion Consistency Thick;Mucous plugs  Suction Tolerance Tolerated well  Suctioning Adverse Effects None   Patient was bagged lavage due to the compromising effect of not being able to pass suction catheter; which was accompanied by decrease lung volumes/minute ventilation on the vent. Saturations were dropping into the mid 80's. Patient was immediately bagged lavage and resistance was noted compressing the BVM. Attempted to suction and was able to finally able to pass the suction catheter and get back copious amounts of pink tinged, and some appearance of old bloody clots out of airway. Post suctioning, was able to get appropriate biphasic lung volumes of the vent. Patient is stable at this time. RN aware and at bedside throughout the event.   Lorenso Quirino L. Katrinka Blazing, BS, RRT-ACCS, RCP

## 2023-04-06 NOTE — Progress Notes (Signed)
Pharmacy Antibiotic Note  Vickie Little is a 58 y.o. female admitted on 04/02/2023 with pneumonia. Pt admitted w/ covid and secondary bacterial PNA. Pharmacy has been consulted for vancomycin and Zosyn dosing with worsening clinical status.  Plan: Zosyn 3.375g IV EI q8h Vancomycin 1250mg  IV q24h -est AUC 504 Follow Cr, LOT  Height: 5\' 7"  (170.2 cm) Weight: 89.8 kg (197 lb 15.6 oz) (TTM pads on, bear hugger on. Not removed due to current condition) IBW/kg (Calculated) : 61.6  Temp (24hrs), Avg:98.7 F (37.1 C), Min:97 F (36.1 C), Max:100.8 F (38.2 C)  Recent Labs  Lab 04/02/23 1249 04/02/23 1307 04/02/23 1308 04/02/23 1558 04/02/23 1736 04/02/23 2017 04/02/23 2152 04/03/23 0544 04/03/23 0739 04/04/23 0418 04/05/23 0523 04/06/23 0517  WBC 11.7*  --   --   --   --   --   --  11.7*  --  14.2* 15.2* 14.4*  CREATININE 1.14*   < >  --   --   --   --    < > 0.66 0.69 0.73 0.78 0.99  LATICACIDVEN  --   --  10.0* 7.0* 5.1* 3.0*  --   --   --   --   --   --    < > = values in this interval not displayed.    Estimated Creatinine Clearance: 73.9 mL/min (by C-G formula based on SCr of 0.99 mg/dL).    No Known Allergies  Antimicrobials this admission: Unasyn 2/9>>2/13 Vancomycin 2/13>> Zosyn 2/13>>    Fredonia Highland, PharmD, BCPS, [facility] Clinical Pharmacist [phone] Please check AMION for all [facility] numbers 04/06/2023

## 2023-04-06 NOTE — Progress Notes (Signed)
Nutrition Brief Note  Pt with increased vent requirements overnight and frequent desaturations this morning predicating increased sedation and push dose paralytic. Episode of emesis yesterday afternoon. Questionable  Required several lavage attempts with mucus plug overnight as well. Spoke with PA-C and attending and verified TFs to be held due to medical instability.  Orders D/C'ed. Will follow up regarding re-initiation.    INTERVENTION:  Hold tube feeding via OGT d/t emesis overnight and medical instability     NUTRITION DIAGNOSIS:  Inadequate oral intake related to inability to eat as evidenced by NPO status. - remains applicable   GOAL:  Patient will meet greater than or equal to 90% of their needs - ongoing   MONITOR:  TF tolerance, Vent status, Labs   Myrtie Cruise MS, RD, LDN Registered Dietitian Clinical Nutrition RD Inpatient Contact Info in Amion

## 2023-04-06 NOTE — Consult Note (Signed)
Brief Neuro Update:  Neurology initially planning to see this patient. However, due to ARDS, has been requiring neuromuscular blockade. I reviewed the MRI Brain and the noted DWI changes are very subtle and not that impressive. To accurately neuro-prognosticate, will need to rely on a combination of good neuro exam off sedation and neuro-muscular blockers, EEG findings and MRI Brain. I spoke with Dr. Chestine Spore and at this time, we will not be able to get a good neuro exam until she is stabilized from a respiratory standpoint.  PCCM team to call us when the patient has stablized from a respiratory standpoint.  Little utility in [place] evaluating her at this time and neuro exam would not be reliable due to neuromuscular blockade.  Erick Blinks [facility]

## 2023-04-07 ENCOUNTER — Inpatient Hospital Stay (HOSPITAL_COMMUNITY): Payer: Managed Care, Other (non HMO)

## 2023-04-07 DIAGNOSIS — R570 Cardiogenic shock: Secondary | ICD-10-CM | POA: Diagnosis not present

## 2023-04-07 DIAGNOSIS — I2510 Atherosclerotic heart disease of native coronary artery without angina pectoris: Secondary | ICD-10-CM | POA: Diagnosis not present

## 2023-04-07 DIAGNOSIS — I469 Cardiac arrest, cause unspecified: Secondary | ICD-10-CM | POA: Diagnosis not present

## 2023-04-07 DIAGNOSIS — J9600 Acute respiratory failure, unspecified whether with hypoxia or hypercapnia: Secondary | ICD-10-CM | POA: Diagnosis not present

## 2023-04-07 DIAGNOSIS — U071 COVID-19: Secondary | ICD-10-CM | POA: Diagnosis not present

## 2023-04-07 DIAGNOSIS — J690 Pneumonitis due to inhalation of food and vomit: Secondary | ICD-10-CM | POA: Diagnosis not present

## 2023-04-07 LAB — BASIC METABOLIC PANEL
Anion gap: 11 (ref 5–15)
BUN: 43 mg/dL — ABNORMAL HIGH (ref 6–20)
CO2: 24 mmol/L (ref 22–32)
Calcium: 9.1 mg/dL (ref 8.9–10.3)
Chloride: 111 mmol/L (ref 98–111)
Creatinine, Ser: 1.19 mg/dL — ABNORMAL HIGH (ref 0.44–1.00)
GFR, Estimated: 54 mL/min — ABNORMAL LOW (ref >60)
Glucose, Bld: 115 mg/dL — ABNORMAL HIGH (ref 70–99)
Potassium: 5 mmol/L (ref 3.5–5.1)
Sodium: 146 mmol/L — ABNORMAL HIGH (ref 135–145)

## 2023-04-07 LAB — POCT I-STAT 7, (LYTES, BLD GAS, ICA,H+H)
Acid-Base Excess: 3 mmol/L — ABNORMAL HIGH (ref 0.0–2.0)
Acid-Base Excess: 2 mmol/L (ref 0.0–2.0)
Bicarbonate: 27.3 mmol/L (ref 20.0–28.0)
Bicarbonate: 25.7 mmol/L (ref 20.0–28.0)
Calcium, Ion: 1.22 mmol/L (ref 1.15–1.40)
Calcium, Ion: 1.21 mmol/L (ref 1.15–1.40)
HCT: 29 % — ABNORMAL LOW (ref 36.0–46.0)
HCT: 27 % — ABNORMAL LOW (ref 36.0–46.0)
Hemoglobin: 9.9 g/dL — ABNORMAL LOW (ref 12.0–15.0)
Hemoglobin: 9.2 g/dL — ABNORMAL LOW (ref 12.0–15.0)
O2 Saturation: 97 %
O2 Saturation: 96 %
Patient temperature: 36.6
Patient temperature: 37.2
Potassium: 4.8 mmol/L (ref 3.5–5.1)
Potassium: 4.2 mmol/L (ref 3.5–5.1)
Sodium: 148 mmol/L — ABNORMAL HIGH (ref 135–145)
Sodium: 147 mmol/L — ABNORMAL HIGH (ref 135–145)
TCO2: 28 mmol/L (ref 22–32)
TCO2: 27 mmol/L (ref 22–32)
pCO2 arterial: 37.9 mm[Hg] (ref 32–48)
pCO2 arterial: 36 mm[Hg] (ref 32–48)
pH, Arterial: 7.465 — ABNORMAL HIGH (ref 7.35–7.45)
pH, Arterial: 7.463 — ABNORMAL HIGH (ref 7.35–7.45)
pO2, Arterial: 84 mm[Hg] (ref 83–108)
pO2, Arterial: 76 mm[Hg] — ABNORMAL LOW (ref 83–108)

## 2023-04-07 LAB — HEPATIC FUNCTION PANEL
ALT: 78 U/L — ABNORMAL HIGH (ref 0–44)
AST: 83 U/L — ABNORMAL HIGH (ref 15–41)
Albumin: 2.8 g/dL — ABNORMAL LOW (ref 3.5–5.0)
Alkaline Phosphatase: 293 U/L — ABNORMAL HIGH (ref 38–126)
Bilirubin, Direct: 0.3 mg/dL — ABNORMAL HIGH (ref 0.0–0.2)
Indirect Bilirubin: 0.8 mg/dL (ref 0.3–0.9)
Total Bilirubin: 1.1 mg/dL (ref 0.0–1.2)
Total Protein: 6.4 g/dL — ABNORMAL LOW (ref 6.5–8.1)

## 2023-04-07 LAB — GLUCOSE, CAPILLARY
Glucose-Capillary: 112 mg/dL — ABNORMAL HIGH (ref 70–99)
Glucose-Capillary: 95 mg/dL (ref 70–99)
Glucose-Capillary: 124 mg/dL — ABNORMAL HIGH (ref 70–99)
Glucose-Capillary: 151 mg/dL — ABNORMAL HIGH (ref 70–99)
Glucose-Capillary: 169 mg/dL — ABNORMAL HIGH (ref 70–99)

## 2023-04-07 LAB — CBC
HCT: 32.3 % — ABNORMAL LOW (ref 36.0–46.0)
Hemoglobin: 10.5 g/dL — ABNORMAL LOW (ref 12.0–15.0)
MCH: 26.3 pg (ref 26.0–34.0)
MCHC: 32.5 g/dL (ref 30.0–36.0)
MCV: 81 fL (ref 80.0–100.0)
Platelets: 208 10*3/uL (ref 150–400)
RBC: 3.99 MIL/uL (ref 3.87–5.11)
RDW: 15.1 % (ref 11.5–15.5)
WBC: 13.1 10*3/uL — ABNORMAL HIGH (ref 4.0–10.5)
nRBC: 1.1 % — ABNORMAL HIGH (ref 0.0–0.2)

## 2023-04-07 LAB — PHOSPHORUS: Phosphorus: 4.7 mg/dL — ABNORMAL HIGH (ref 2.5–4.6)

## 2023-04-07 LAB — COOXEMETRY PANEL
Carboxyhemoglobin: 1.2 % (ref 0.5–1.5)
Carboxyhemoglobin: 1 % (ref 0.5–1.5)
Methemoglobin: 0.9 % (ref 0.0–1.5)
Methemoglobin: 0.7 % (ref 0.0–1.5)
O2 Saturation: 99.6 %
O2 Saturation: 54.8 %
Total hemoglobin: 11.1 g/dL — ABNORMAL LOW (ref 12.0–16.0)
Total hemoglobin: 10.5 g/dL — ABNORMAL LOW (ref 12.0–16.0)

## 2023-04-07 LAB — TRIGLYCERIDES: Triglycerides: 86 mg/dL (ref <150)

## 2023-04-07 LAB — MAGNESIUM: Magnesium: 2.4 mg/dL (ref 1.7–2.4)

## 2023-04-07 MED ORDER — PROSOURCE TF20 ENFIT COMPATIBL EN LIQD
60.0000 mL | Freq: Every day | ENTERAL | Status: DC
  Administered 2023-04-07 – 2023-04-18 (×11): 60 mL
  Filled 2023-04-07 (×11): qty 60

## 2023-04-07 MED ORDER — VITAL 1.5 CAL PO LIQD
1000.0000 mL | ORAL | Status: DC
  Administered 2023-04-07 – 2023-04-09 (×3): 1000 mL

## 2023-04-07 MED ORDER — BUSPIRONE HCL 10 MG PO TABS
15.0000 mg | ORAL_TABLET | Freq: Three times a day (TID) | ORAL | Status: DC
  Administered 2023-04-07 – 2023-04-10 (×10): 15 mg
  Filled 2023-04-07 (×10): qty 2

## 2023-04-07 MED ORDER — FUROSEMIDE 10 MG/ML IJ SOLN
80.0000 mg | Freq: Two times a day (BID) | INTRAMUSCULAR | Status: AC
  Administered 2023-04-07 (×2): 80 mg via INTRAVENOUS
  Filled 2023-04-07 (×2): qty 8

## 2023-04-07 MED ORDER — SODIUM CHLORIDE 0.9 % IV SOLN
3.0000 g | Freq: Three times a day (TID) | INTRAVENOUS | Status: DC
  Administered 2023-04-07 – 2023-04-10 (×8): 3 g via INTRAVENOUS
  Filled 2023-04-07 (×8): qty 8

## 2023-04-07 MED ORDER — METOCLOPRAMIDE HCL 5 MG/ML IJ SOLN
5.0000 mg | Freq: Three times a day (TID) | INTRAMUSCULAR | Status: DC
  Administered 2023-04-07 – 2023-04-14 (×21): 5 mg via INTRAVENOUS
  Filled 2023-04-07 (×21): qty 2

## 2023-04-07 MED ORDER — METOLAZONE 2.5 MG PO TABS
2.5000 mg | ORAL_TABLET | Freq: Once | ORAL | Status: AC
  Administered 2023-04-07: 2.5 mg
  Filled 2023-04-07: qty 1

## 2023-04-07 NOTE — Progress Notes (Addendum)
NAME:  Vickie Little, MRN:  [id number], DOB:  02/25/1965, LOS: 5 ADMISSION DATE:  04/02/2023, CONSULTATION DATE:  04/02/2023 REFERRING MD:  Denton Lank - EDP, CHIEF COMPLAINT: Cardiac arrest    History of Present Illness:  58 year old female patient with history of metabolic syndrome, and coronary artery disease followed by cardiology last seen in January 2025.  Was at church singing in choir, had a witnessed collapse.  EMS was called on fire arrival the patient was agonal with weak pulses. On EMS arrival the patient was pulseless CPR was initiated, initial rhythm VF, ACLS interventions included epinephrine x 2, defibrillation x 5, amiodarone 300 mg.  An IO was placed in the right humerus she was intubated return of spontaneous circulation estimated at 15 minutes On arrival to [facility] initial EKG negative for ST elevation white blood cell count 11.7 glucose 408 post CPR lactic acid 10 Cardiology consulted by EDP Critical care asked to admit.  Pertinent Medical History:  Coronary artery disease, with prior angina seen by cardiology last in January 2025 type 2 diabetes with retinopathy, last A1c of record 9.5, hypertension, hyperlipidemia Remote herniated disc Obesity  Significant Hospital Events: Including procedures, antibiotic start and stop dates in addition to other pertinent events   2/9 Admitted status post VF arrest time to return of spontaneous circulation estimated at 15 minutes hemodynamically stable on arrival to the ER decorticate posturing 2/10: con't TTM, started on cleviprex  2/11: no change in mental status, needs off sedation for neuro eval 2/12: tachypnea/dysynchrony overnight on vent requiring prop back on at 15. On ttm overnight. Still on cleviprex  2/13: increased vent requirements overnight. Had mucous plug lavaged out. MRI equivocal for anoxic injury. Broadening abx, starting milrinone, lasix, steroids, intermittent paralytic.  2/14: coming down on vent settings,  remains on sedation   Interim History / Subjective:  Improving vent requirements since yesterday afternoon, now on 60/12. Repeat ABG with PaO2 80s so will have to hold for now. Still on sedation. Neuro exam pending once able to get off sedation. De-escalating antibiotics back to Unasyn. I suspect past 24 hours may have been aspiration pneumonitis causing her increased requirements. Coox 54. Will continue lasix and milrinone for now.   Objective:  Blood pressure 109/81, pulse 87, temperature 98.6 F (37 C), resp. rate 18, height 5\' 7"  (1.702 m), weight 90.5 kg, last menstrual period 03/25/2016, SpO2 100%. CVP:  [7 mmHg-26 mmHg] 10 mmHg  Vent Mode: PRVC FiO2 (%):  [60 %-100 %] 60 % Set Rate:  [30 bmp-35 bmp] 30 bmp Vt Set:  [360 mL] 360 mL PEEP:  [12 cmH20-15 cmH20] 12 cmH20 Plateau Pressure:  [17 cmH20-29 cmH20] 17 cmH20   Intake/Output Summary (Last 24 hours) at 04/07/2023 1023 Last data filed at 04/07/2023 1000 Gross per 24 hour  Intake 2156.75 ml  Output 1482 ml  Net 674.75 ml   Filed Weights   04/05/23 0500 04/06/23 0500 04/07/23 0704  Weight: 88.5 kg 89.8 kg 90.5 kg   Physical Examination: General: critically ill appearing, laying in bed, vented, sedated  HEENT: pupils 3mm ERRLA, mmm, anicteric sclera, ETT, OGT Neuro: sedated, intubated. +gag/cough. Does not withdrawal to painful stimulus.  CV: s1s2, tachycardia, no murmur, rub, gallop  PULM: vented 60% 12 PEEP, tachypneic,no respiratory distress, rhonchi L>R GI: rounded, soft, ntnd Extremities: no significant pitting edema Skin: warm/dry/intact, TTM blanket on shivering  Resolved Hospital Problem List:  Lactic acidosis Status post cardiac arrest shock Assessment & Plan:  Status post VF arrest with  known history of coronary artery disease Acute HFrEF, concern for ischemia vs post-arrest  Echo 2/10 with EF 40-45%, global hypokinesis, mild LVH, G1DD, trivial MR. Coox 54. Fick 2/13 1.8 when milrinone started. CXR with pulm  edema.  - con't TTM protocol - normothermia  - schedule buspar for shivering - pressors stopped 2/12 AM, can restart for MAP goal >65 - con't milrinone @ 0.25 - con't lasix 80mg  bid  - con't amiodarone  - con't asa, statin, zetia  - con't lovenox ppx - Cardiology following, appreciate recommendations - Ischemia eval pending clinical trajectory/neurologic recovery  Acute respiratory failure following cardiopulmonary arrest COVID-19 positive Aspiration pneumonitis Intubated in the field, at risk for aspiration. ETT exchange completed 2/9PM. Presumed aspiration pneumonitis vs pneumonia. Started on unasyn and procal continues to downtrend. 2/13: Mucous plug overnight with tachypnea, increasing vent settings.  Frequent desaturations during AM rounds requiring BVM, increasing sedation, push dose paralytic - de-escalate abx back to Unasyn  - con't solu-cortef 100mg  q12h  - prn rocuronium  - Continue full vent support (4-8cc/kg IBW) - Wean FiO2 for O2 sat > 90% - VAP and PAD protocol ordered. Propofol/fent. Will deepen sedation for PRN paralytics  - Pulmonary hygiene  Acute metabolic encephalopathy status postcardiac arrest concerning for anoxic brain injury CT Head 2/9 NAICA. No significant improvement in mental status when off sedation. MRI 2/13 equivocal - consult neuro with assistance with neuroprognostication. Will be limited now with sedation for above.  - Neuroprotective measures: HOB > 30 degrees, normoglycemia, normothermia, electrolytes WNL  Diabetes with hyperglycemia - cbg q4h  - SSI coverage  - TF coverage 4U q4h  - increase semglee to 15U BID - Goal CBG 140-180  Hypertension  - SBP goal <140 - cleviprex as needed  - avoid BB with low EF   Best Practice (right click and "Reselect all SmartList Selections" daily)   Diet/type: tubefeeds and NPO DVT prophylaxis LMWH Pressure ulcer(s): N/A GI prophylaxis: H2B Lines: Central line, Arterial Line, and yes and it is still  needed Foley:  Yes, and it is still needed Code Status:  full code Last date of multidisciplinary goals of care discussion: updated husband at bedside this AM  Critical care time: 16   Cristopher Peru, PA-C [facility] 04/07/23 10:23 AM  Please see [email or web address] for pager details.  From 7A-7P if no response, please call [phone] After hours, please call Pola Corn [phone]     Critical care attending attestation note: I agree with the Medical Resident/Advanced Practitioner's note, impression, and recommendations as outlined. I have taken an independent interval history, reviewed the chart and examined the patient. The following reflects my medical decision making and independent critical care time   Synopsis of assessment and plan: 58 year old female here with V-fib cardiac arrest of unclear etiology, course was complicated with acute respiratory failure with hypoxia due to bilateral multifocal pneumonia  FiO2 was titrated down to 50% and PEEP at 10 She remained afebrile    Physical exam: General: Critically ill-appearing obese female, orally intubated HEENT: [place]/AT, eyes anicteric.  ETT and OGT in place Neuro: Sedated, not following commands.  Eyes are closed.  Pupils 3 mm bilateral reactive to light Chest: Coarse breath sounds, no wheezes or rhonchi Heart: Regular rate and rhythm, no murmurs or gallops Abdomen: Soft, nontender, nondistended, bowel sounds present Skin: No rash  Labs and images were reviewed  Assessment and plan: Status post V-fib cardiac arrest Demand cardiac ischemia vs Acute NSTEMI Acute HFrEF with  cardiogenic shock Acute respiratory failure with hypoxia Aspiration ammonia COVID-19 pneumonia Acute encephalopathy postcardiac arrest, anoxic versus metabolic Diabetes type 2 with hyperglycemia Hypertension Obesity Acute kidney injury  Continue amiodarone Cardiology is following, patient will need further ischemic workup if she  recovers neurologically Patient did not complain of chest pain prior to presentation Her troponins were elevated Echocardiogram showed low EF with wall motion abnormalities Continue milrinone Continue lung protective ventilation, titrate FiO2 and PEEP with O2 sat goal 92% Continue IV Unasyn Respiratory culture is growing gram-positive cocci Continue airborne isolation Continue steroid Avoid deep sedation Continue insulin with CBG goal 140-180 Avoid nephrotoxic agent Monitor serum creatinine   This patient is critically ill with multiple organ system failure which requires frequent high complexity decision making, assessment, support, evaluation, and titration of therapies. This was completed through the application of advanced monitoring technologies and extensive interpretation of multiple databases.  During this encounter critical care time was devoted to patient care services described in this note for 32 minutes.    Cheri Fowler, MD [facility] See Amion for pager If no response to pager, please call [phone] until 7pm After 7pm, Please call E-link [phone]   04/07/2023, 1:31 PM

## 2023-04-07 NOTE — Progress Notes (Addendum)
Advanced Heart Failure Rounding Note  Cardiologist: Tessa Lerner, DO  HF Cardiologist: Dr. Shirlee Latch  Chief Complaint: OOH VF arrest Subjective:    Intubated/sedated.    Objective:    Weight Range: 89.8 kg Body mass index is 31.01 kg/m.   Vital Signs:   Temp:  [96.6 F (35.9 C)-99.1 F (37.3 C)] 97.9 F (36.6 C) (02/14 0600) Pulse Rate:  [87-130] 90 (02/14 0630) Resp:  [12-41] 18 (02/14 0630) BP: (79-163)/(54-150) 102/84 (02/14 0400) SpO2:  [81 %-100 %] 100 % (02/14 0630) FiO2 (%):  [70 %-100 %] 70 % (02/14 0254) Last BM Date : 04/06/23  Weight change: Filed Weights   04/04/23 0500 04/05/23 0500 04/06/23 0500  Weight: 93.9 kg 88.5 kg 89.8 kg   Intake/Output:   Intake/Output Summary (Last 24 hours) at 04/07/2023 0705 Last data filed at 04/07/2023 0400 Gross per 24 hour  Intake 2035.79 ml  Output 1862 ml  Net 173.79 ml  CVP 12-13   Physical Exam   General:  Intubated.  Neck: supple. JVP difficult to assess.   LIJ  Cor: PMI nondisplaced. Regular rate & rhythm. No rubs, gallops or murmurs. Lungs: Coarse throughout.  Abdomen: soft, nontender Extremities: no cyanosis, clubbing, rash, edema  Telemetry  SR-ST 90-100s  Labs    CBC Recent Labs    04/06/23 0517 04/06/23 0657 04/06/23 1015 04/07/23 0359  WBC 14.4*  --   --  13.1*  HGB 12.5   < > 11.2* 10.5*  HCT 38.8   < > 33.0* 32.3*  MCV 80.3  --   --  81.0  PLT 241  --   --  208   < > = values in this interval not displayed.   Basic Metabolic Panel Recent Labs    29/56/21 0517 04/06/23 0657 04/06/23 1015 04/07/23 0359  NA 149*   < > 149* 146*  K 3.6   < > 3.4* 5.0  CL 110  --   --  111  CO2 20*  --   --  24  GLUCOSE 268*  --   --  115*  BUN 29*  --   --  43*  CREATININE 0.99  --   --  1.19*  CALCIUM 10.0  --   --  9.1  MG 2.3  --   --  2.4  PHOS 3.5  --   --  4.7*   < > = values in this interval not displayed.   Liver Function Tests Recent Labs    04/06/23 0517 04/07/23 0359  AST  92* 83*  ALT 102* 78*  ALKPHOS 338* 293*  BILITOT 0.8 1.1  PROT 6.6 6.4*  ALBUMIN 2.8* 2.8*   No results for input(s): "LIPASE", "AMYLASE" in the last 72 hours. Cardiac Enzymes No results for input(s): "CKTOTAL", "CKMB", "CKMBINDEX", "TROPONINI" in the last 72 hours.  BNP: BNP (last 3 results) No results for input(s): "BNP" in the last 8760 hours.  ProBNP (last 3 results) No results for input(s): "PROBNP" in the last 8760 hours.  D-Dimer No results for input(s): "DDIMER" in the last 72 hours. Hemoglobin A1C No results for input(s): "HGBA1C" in the last 72 hours.  Fasting Lipid Panel Recent Labs    04/07/23 0359  TRIG 86   Thyroid Function Tests No results for input(s): "TSH", "T4TOTAL", "T3FREE", "THYROIDAB" in the last 72 hours.  Invalid input(s): "FREET3"  Other results:  Imaging   No results found.  Medications:    Scheduled Medications:  aspirin  81 mg Per Tube Daily   atorvastatin  80 mg Per Tube Daily   Chlorhexidine Gluconate Cloth  6 each Topical Daily   docusate  100 mg Per Tube BID   enoxaparin (LOVENOX) injection  40 mg Subcutaneous Q24H   ezetimibe  10 mg Per Tube Daily   famotidine  20 mg Per Tube BID   free water  100 mL Per Tube Q2H   hydrocortisone sod succinate (SOLU-CORTEF) inj  100 mg Intravenous Q12H   insulin aspart  0-20 Units Subcutaneous Q4H   insulin glargine-yfgn  22 Units Subcutaneous BID   mouth rinse  15 mL Mouth Rinse Q2H   polyethylene glycol  17 g Per Tube Daily    Infusions:  sodium chloride     amiodarone 30 mg/hr (04/07/23 0420)   clevidipine Stopped (04/06/23 0902)   fentaNYL infusion INTRAVENOUS 50 mcg/hr (04/07/23 0400)   milrinone 0.25 mcg/kg/min (04/07/23 0400)   norepinephrine (LEVOPHED) Adult infusion Stopped (04/06/23 1359)   piperacillin-tazobactam (ZOSYN)  IV 3.375 g (04/07/23 0603)   propofol (DIPRIVAN) infusion 40 mcg/kg/min (04/07/23 0620)   vancomycin Stopped (04/06/23 1057)    PRN  Medications: acetaminophen **OR** acetaminophen (TYLENOL) oral liquid 160 mg/5 mL **OR** acetaminophen, dextrose, fentaNYL (SUBLIMAZE) injection, fentaNYL (SUBLIMAZE) injection, ipratropium-albuterol, ondansetron (ZOFRAN) IV, mouth rinse  Patient Profile   Vickie Little is a 58 year old with past medical history of NSTEMI in 2013 s/p PCI to LAD, CAD, hypertension, hyperlipidemia, type 2 diabetes, PAD, and neurogenic lumbar claudication, and obesity.   Admitted with OOH VF arrest.  Assessment/Plan   1. VF arrest: Witnessed arrest but was down an unclear amount of time pre-CPR.  When EMS arrived, had 15 minutes CPR + shock x 4 with ROSC.  Cause of arrest uncertain, no chest pain prior but TnI 9509.   - No further events.  - Continue amiodarone gtt 30/hr 2. CAD: History of CAD with PCI to LAD in 2013.  No chest pain prior to event but HS-TnI elevated 9509, ?demand ischemia with prolonged shock vs ACS. ECG without STEMI, nonspecific changes.  - on Lovenox at DVT dose - atorvastatin 80 daily - ASA per tube - If she has neurologic recovery, will need coronary angiography.  3. Cardiogenic shock: Echo with EF 40-45%, normal RV.  Previous echo was in 2013 with normal EF.  - Repeat CO-OX. CVP 12-13. Continue IV lasix. Renal function stable.  - Continue  Milrinone 0.25 mcg/kg/min - Lactic acid clearing.  4. Elevated LFTs: Suspect shock liver.  -LFTs trending down.  - Follow CMET daily.  5. DKA: Poorly controlled DM2, A1c 10.9.  Beta hydroxybutyrate decreased to normal.  - Now on [place] insulin.  6. COVID+: Supportive care.  7. Acute hypoxemic respiratory failure: Unasyn for aspiration coverage.  Vent per CCM.  - sedation for vent compliance and oxygenation per CCM 8. 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  - brain MRI  possible hypoxic ischemic injury - need neuro consult for prognostication.  9. ID: No recurrent fever overnight. PCT trended down to 0.8.   COVID+ but also with suspected aspiration PNA/pneumonitis.  - Cultures-negative.  -  Now on Vanc and Zosyn    Length of Stay: 4  CRITICAL CARE Performed by: Tonye Becket NP-C   Total critical care time: 20 minutes  Critical care time was exclusive of separately billable procedures and treating other patients.  Critical care was necessary to treat or prevent imminent or life-threatening deterioration.  Critical care was time spent  personally by me on the following activities: development of treatment plan with patient and/or surrogate as well as nursing, discussions with consultants, evaluation of patient's response to treatment, examination of patient, obtaining history from patient or surrogate, ordering and performing treatments and interventions, ordering and review of laboratory studies, ordering and review of radiographic studies, pulse oximetry and re-evaluation of patient's condition.   Tonye Becket, NP  04/07/2023, 7:05 AM  Advanced Heart Failure Team Pager [phone] (M-F; 7a - 5p)  Please contact [facility] for night-coverage after hours (5p -7a ) and weekends on [email or web address]   Patient seen with NP, I formulated the plan and agree with the above note.   Patient is sedated on Propofol.  CXR shows decreased edema.  She is on milrinone 0.25 with co-ox 55%, I/Os close to even. CVP 12-13 this morning.   Not responsive but sedated.   General: Sedated on vent.  Neck: JVP 10-12 cm, no thyromegaly or thyroid nodule.  Lungs: Clear to auscultation bilaterally with normal respiratory effort. CV: Nondisplaced PMI.  Heart regular S1/S2, no S3/S4, no murmur.  No peripheral edema.    Abdomen: Soft, nontender, no hepatosplenomegaly, no distention.  Skin: Intact without lesions or rashes.  Neurologic: Not responsive.  Extremities: No clubbing or cyanosis.  HEENT: Normal.   Still no purposeful response, worried for anoxic encephalopathy.  MRI head is equivocal.  Unfortunately, she is still  sedated and cannot get adequate exam for prognostication.   She is on vancomycin/Zosyn for possible aspiration PNA, FiO2 0.6. CXR more clear today.    Co-ox 55% on milrinone 0.25, I/Os even yesterday with CVP 12-13. Creatinine 1.19.  - Increase milrinone to 0.375.  - Lasix 80 mg IV bid and will give a dose of metolazone 2.5 x 1.   Updated family.   CRITICAL CARE Performed by: Marca Ancona  Total critical care time: 35 minutes  Critical care time was exclusive of separately billable procedures and treating other patients.  Critical care was necessary to treat or prevent imminent or life-threatening deterioration.  Critical care was time spent personally by me on the following activities: development of treatment plan with patient and/or surrogate as well as nursing, discussions with consultants, evaluation of patient's response to treatment, examination of patient, obtaining history from patient or surrogate, ordering and performing treatments and interventions, ordering and review of laboratory studies, ordering and review of radiographic studies, pulse oximetry and re-evaluation of patient's condition.  Marca Ancona 04/07/2023 10:35 AM

## 2023-04-07 NOTE — Plan of Care (Signed)
Problem: Education: Goal: Ability to describe self-care measures that may prevent or decrease complications (Diabetes Survival Skills Education) will improve Outcome: Not Progressing Goal: Individualized Educational Video(s) Outcome: Not Progressing   Problem: Coping: Goal: Ability to adjust to condition or change in health will improve Outcome: Not Progressing   Problem: Fluid Volume: Goal: Ability to maintain a balanced intake and output will improve Outcome: Not Progressing   Problem: Health Behavior/Discharge Planning: Goal: Ability to identify and utilize available resources and services will improve Outcome: Not Progressing Goal: Ability to manage health-related needs will improve Outcome: Not Progressing   Problem: Metabolic: Goal: Ability to maintain appropriate glucose levels will improve Outcome: Not Progressing   Problem: Nutritional: Goal: Maintenance of adequate nutrition will improve Outcome: Not Progressing Goal: Progress toward achieving an optimal weight will improve Outcome: Not Progressing   Problem: Skin Integrity: Goal: Risk for impaired skin integrity will decrease Outcome: Not Progressing   Problem: Tissue Perfusion: Goal: Adequacy of tissue perfusion will improve Outcome: Not Progressing   Problem: Education: Goal: Ability to manage disease process will improve Outcome: Not Progressing   Problem: Cardiac: Goal: Ability to achieve and maintain adequate cardiopulmonary perfusion will improve Outcome: Not Progressing   Problem: Neurologic: Goal: Promote progressive neurologic recovery Outcome: Not Progressing   Problem: Skin Integrity: Goal: Risk for impaired skin integrity will be minimized. Outcome: Not Progressing   Problem: Education: Goal: Knowledge of risk factors and measures for prevention of condition will improve Outcome: Not Progressing   Problem: Coping: Goal: Psychosocial and spiritual needs will be supported Outcome: Not  Progressing   Problem: Respiratory: Goal: Will maintain a patent airway Outcome: Not Progressing Goal: Complications related to the disease process, condition or treatment will be avoided or minimized Outcome: Not Progressing   Problem: Education: Goal: Knowledge of General Education information will improve Description: Including pain rating scale, medication(s)/side effects and non-pharmacologic comfort measures Outcome: Not Progressing   Problem: Clinical Measurements: Goal: Ability to maintain clinical measurements within normal limits will improve Outcome: Not Progressing Goal: Will remain free from infection Outcome: Not Progressing Goal: Diagnostic test results will improve Outcome: Not Progressing Goal: Respiratory complications will improve Outcome: Not Progressing Goal: Cardiovascular complication will be avoided Outcome: Not Progressing   Problem: Activity: Goal: Risk for activity intolerance will decrease Outcome: Not Progressing   Problem: Nutrition: Goal: Adequate nutrition will be maintained Outcome: Not Progressing   Problem: Coping: Goal: Level of anxiety will decrease Outcome: Not Progressing   Problem: Elimination: Goal: Will not experience complications related to bowel motility Outcome: Not Progressing   Problem: Pain Managment: Goal: General experience of comfort will improve and/or be controlled Outcome: Not Progressing   Problem: Safety: Goal: Ability to remain free from injury will improve Outcome: Not Progressing   Problem: Skin Integrity: Goal: Risk for impaired skin integrity will decrease Outcome: Not Progressing   Problem: Education: Goal: Ability to describe self-care measures that may prevent or decrease complications (Diabetes Survival Skills Education) will improve Outcome: Not Progressing Goal: Individualized Educational Video(s) Outcome: Not Progressing   Problem: Cardiac: Goal: Ability to maintain an adequate cardiac  output will improve Outcome: Not Progressing   Problem: Fluid Volume: Goal: Ability to achieve a balanced intake and output will improve Outcome: Not Progressing   Problem: Metabolic: Goal: Ability to maintain appropriate glucose levels will improve Outcome: Not Progressing   Problem: Nutritional: Goal: Maintenance of adequate nutrition will improve Outcome: Not Progressing Goal: Maintenance of adequate weight for body size and type will improve Outcome:  Not Progressing   Problem: Respiratory: Goal: Will regain and/or maintain adequate ventilation Outcome: Not Progressing   Problem: Urinary Elimination: Goal: Ability to achieve and maintain adequate renal perfusion and functioning will improve Outcome: Not Progressing

## 2023-04-07 NOTE — Inpatient Diabetes Management (Signed)
Inpatient Diabetes Program Recommendations  AACE/ADA: New Consensus Statement on Inpatient Glycemic Control (2015)  Target Ranges:  Prepandial:   less than 140 mg/dL      Peak postprandial:   less than 180 mg/dL (1-2 hours)      Critically ill patients:  140 - 180 mg/dL   Lab Results  Component Value Date   GLUCAP 95 04/07/2023   HGBA1C 9.2 (H) 04/02/2023    Review of Glycemic Control  Latest Reference Range & Units 04/06/23 23:50 04/07/23 03:52 04/07/23 07:12  Glucose-Capillary 70 - 99 mg/dL 161 (H) 096 (H) 95   Diabetes history: DM 2 Outpatient Diabetes medications:  Farxiga 10 mg daily Amaryl 2 mg daily Metformin 1000 mg bid Ozempic 2 mg weekly Current orders for Inpatient glycemic control:  Novolog 0-20 units q 4 hours Semglee 22 units bid Inpatient Diabetes Program Recommendations:    Consider reducing Semglee to 14 units bid and also consider reducing Novolog correction to sensitive (0-9 units) while feeds are on hold.   Thanks,  Lorenza Cambridge, RN, BC-ADM Inpatient Diabetes Coordinator Pager [phone]  (8a-5p)

## 2023-04-07 NOTE — Progress Notes (Addendum)
Nutrition Follow-up  DOCUMENTATION CODES:   Obesity unspecified  INTERVENTION:  Initiate tube feeding via OGT: -Vital 1.5 at 20 ml/h and DO NOT ADVANCE -Prosource TF20 60 ml daily - FWF Q2 hours  Once clinically stable and able to advance:  -Increase by 100mL/hr Q10 until goal rate of 48ml/hr achieved    Provides 2060 kcal, 109 gm protein, 1008 ml free water daily   NUTRITION DIAGNOSIS:   Inadequate oral intake related to inability to eat as evidenced by NPO status. - remains applicable  GOAL:  Patient will meet greater than or equal to 90% of their needs - ongoing  MONITOR:  TF tolerance, Vent status, Labs  REASON FOR ASSESSMENT:  Consult Enteral/tube feeding initiation and management  ASSESSMENT:  Pt is a 57y.o female with cardiac arrest. PMH: T2DM, CAD, HTN, HLD, obesity. Required total of 15 minutes of CPR.  2/9 admitted/intubated/OGT placed 2/10 echo performed 2/11 TFs initiated 2/12 significant emesis, fecal management system placed 2/13 TFs held; multiple bag lavages 2/14 trickles re-initiated  Patient is currently intubated on ventilator support MV: 11.7 L/min Temp (24hrs), Avg:98.6 F (37 C), Min:97.5 F (36.4 C), Max:99.1 F (37.3 C)  Propofol: 21.6 ml/hr   Suspected aspiration pneumonitis causing progression into ARDS after vomiting episode 2/12. TFs held at that point. Spoke to [employer] about re-initiating feeds. Amicable to restarting trickles and holding to assess tolerance. Reglan also started.   Requiring no pressor support. Remains sedated on propofol and fentanyl preventing prognostication. Improving vent requirements since yesterday afternoon.   Admit Weight: 88.5kg Current Weight: 90.5kg  Weight stable. Fecal management system noted as placed on 2/12 with 40mL output noted today. No edema observed to lower extremities. UOP stable at 1.9L yesterday.   Intake/Output Summary (Last 24 hours) at 04/07/2023 1320 Last data filed at 04/07/2023  1200 Gross per 24 hour  Intake 2076.56 ml  Output 1352 ml  Net 724.56 ml     Net IO Since Admission: 1,581.32 mL [04/07/23 1320]   Meds: docusate, famotidine, furosemide, SSI, semglee,reglan, Miralax Drips: Proprofol @ 21.6 mL/hr Fentanyl @50mcg /hr  Labs:  Na 149>146>148 (H) K+ 3.4>5.0>4.8 (wdl) PHOS 2.8>4.7 (H) Mg 2.3>2.4(wdl) BUN 19>29>43 (H) Crt 1.61>0.96>0.45 (H) Lactic Acid 5.1>3.0>2.4 (H) WBC 15.2>14.4>13.1 (H) CBGs 115-268 over 48 hours A1c 9.2 (03/2023)  Diet Order:   Diet Order             Diet NPO time specified  Diet effective now            EDUCATION NEEDS:  Not appropriate for education at this time  Skin:  Skin Assessment: Reviewed RN Assessment  Last BM:  2/14 - type 7 - fecal management system  Height:  Ht Readings from Last 1 Encounters:  04/02/23 5\' 7"  (1.702 m)   Weight:  Wt Readings from Last 1 Encounters:  04/07/23 90.5 kg   Ideal Body Weight:  61.4 kg  BMI:  Body mass index is 31.25 kg/m.  Estimated Nutritional Needs:   Kcal:  2000-2200kcal  Protein:  100-110g  Fluid:  >2L/day  Myrtie Cruise MS, RD, LDN Registered Dietitian Clinical Nutrition RD Inpatient Contact Info in Amion

## 2023-04-08 DIAGNOSIS — J9600 Acute respiratory failure, unspecified whether with hypoxia or hypercapnia: Secondary | ICD-10-CM | POA: Diagnosis not present

## 2023-04-08 DIAGNOSIS — I2510 Atherosclerotic heart disease of native coronary artery without angina pectoris: Secondary | ICD-10-CM | POA: Diagnosis not present

## 2023-04-08 DIAGNOSIS — N179 Acute kidney failure, unspecified: Secondary | ICD-10-CM

## 2023-04-08 DIAGNOSIS — I10 Essential (primary) hypertension: Secondary | ICD-10-CM

## 2023-04-08 DIAGNOSIS — I469 Cardiac arrest, cause unspecified: Secondary | ICD-10-CM | POA: Diagnosis not present

## 2023-04-08 DIAGNOSIS — R570 Cardiogenic shock: Secondary | ICD-10-CM | POA: Diagnosis not present

## 2023-04-08 DIAGNOSIS — J690 Pneumonitis due to inhalation of food and vomit: Secondary | ICD-10-CM | POA: Diagnosis not present

## 2023-04-08 DIAGNOSIS — U071 COVID-19: Secondary | ICD-10-CM | POA: Diagnosis not present

## 2023-04-08 DIAGNOSIS — E870 Hyperosmolality and hypernatremia: Secondary | ICD-10-CM

## 2023-04-08 LAB — BASIC METABOLIC PANEL
Anion gap: 9 (ref 5–15)
BUN: 52 mg/dL — ABNORMAL HIGH (ref 6–20)
CO2: 27 mmol/L (ref 22–32)
Calcium: 8.8 mg/dL — ABNORMAL LOW (ref 8.9–10.3)
Chloride: 108 mmol/L (ref 98–111)
Creatinine, Ser: 1.18 mg/dL — ABNORMAL HIGH (ref 0.44–1.00)
GFR, Estimated: 55 mL/min — ABNORMAL LOW (ref >60)
Glucose, Bld: 155 mg/dL — ABNORMAL HIGH (ref 70–99)
Potassium: 3.6 mmol/L (ref 3.5–5.1)
Sodium: 144 mmol/L (ref 135–145)

## 2023-04-08 LAB — POCT I-STAT 7, (LYTES, BLD GAS, ICA,H+H)
Acid-Base Excess: 6 mmol/L — ABNORMAL HIGH (ref 0.0–2.0)
Bicarbonate: 31 mmol/L — ABNORMAL HIGH (ref 20.0–28.0)
Calcium, Ion: 1.21 mmol/L (ref 1.15–1.40)
HCT: 29 % — ABNORMAL LOW (ref 36.0–46.0)
Hemoglobin: 9.9 g/dL — ABNORMAL LOW (ref 12.0–15.0)
O2 Saturation: 93 %
Patient temperature: 98.2
Potassium: 3.8 mmol/L (ref 3.5–5.1)
Sodium: 146 mmol/L — ABNORMAL HIGH (ref 135–145)
TCO2: 32 mmol/L (ref 22–32)
pCO2 arterial: 44.7 mm[Hg] (ref 32–48)
pH, Arterial: 7.448 (ref 7.35–7.45)
pO2, Arterial: 64 mm[Hg] — ABNORMAL LOW (ref 83–108)

## 2023-04-08 LAB — COOXEMETRY PANEL
Carboxyhemoglobin: 1.3 % (ref 0.5–1.5)
Methemoglobin: <0.7 % (ref 0.0–1.5)
O2 Saturation: 59 %
Total hemoglobin: 10 g/dL — ABNORMAL LOW (ref 12.0–16.0)

## 2023-04-08 LAB — GLUCOSE, CAPILLARY
Glucose-Capillary: 105 mg/dL — ABNORMAL HIGH (ref 70–99)
Glucose-Capillary: 152 mg/dL — ABNORMAL HIGH (ref 70–99)
Glucose-Capillary: 165 mg/dL — ABNORMAL HIGH (ref 70–99)
Glucose-Capillary: 176 mg/dL — ABNORMAL HIGH (ref 70–99)
Glucose-Capillary: 176 mg/dL — ABNORMAL HIGH (ref 70–99)
Glucose-Capillary: 203 mg/dL — ABNORMAL HIGH (ref 70–99)
Glucose-Capillary: 205 mg/dL — ABNORMAL HIGH (ref 70–99)

## 2023-04-08 MED ORDER — POTASSIUM CHLORIDE 20 MEQ PO PACK
40.0000 meq | PACK | Freq: Once | ORAL | Status: AC
  Administered 2023-04-08: 40 meq
  Filled 2023-04-08: qty 2

## 2023-04-08 MED ORDER — FUROSEMIDE 10 MG/ML IJ SOLN
80.0000 mg | Freq: Two times a day (BID) | INTRAMUSCULAR | Status: AC
  Administered 2023-04-08 (×2): 80 mg via INTRAVENOUS
  Filled 2023-04-08 (×2): qty 8

## 2023-04-08 MED ORDER — HYDROCORTISONE SOD SUC (PF) 100 MG IJ SOLR
100.0000 mg | Freq: Every day | INTRAMUSCULAR | Status: DC
  Administered 2023-04-09: 100 mg via INTRAVENOUS
  Filled 2023-04-08: qty 2

## 2023-04-08 MED ORDER — DEXMEDETOMIDINE HCL IN NACL 400 MCG/100ML IV SOLN
0.0000 ug/kg/h | INTRAVENOUS | Status: DC
  Administered 2023-04-08: 1.2 ug/kg/h via INTRAVENOUS
  Administered 2023-04-08 – 2023-04-09 (×4): 0.5 ug/kg/h via INTRAVENOUS
  Administered 2023-04-09 (×2): 1.2 ug/kg/h via INTRAVENOUS
  Administered 2023-04-10: 0.5 ug/kg/h via INTRAVENOUS
  Administered 2023-04-10 – 2023-04-11 (×2): 0.4 ug/kg/h via INTRAVENOUS
  Filled 2023-04-08 (×7): qty 100
  Filled 2023-04-08: qty 200
  Filled 2023-04-08: qty 100

## 2023-04-08 MED ORDER — HYDRALAZINE HCL 20 MG/ML IJ SOLN
10.0000 mg | INTRAMUSCULAR | Status: DC | PRN
  Administered 2023-04-09: 10 mg via INTRAVENOUS
  Filled 2023-04-08: qty 1

## 2023-04-08 NOTE — Progress Notes (Signed)
 NAME:  Vickie Little, MRN:  [id number], DOB:  [birth year], LOS: 6 ADMISSION DATE:  04/02/2023, CONSULTATION DATE:  04/02/2023 REFERRING MD:  Denton Lank - EDP, CHIEF COMPLAINT: Cardiac arrest    History of Present Illness:  58 year old female patient with history of metabolic syndrome, and coronary artery disease followed by cardiology last seen in January 2025.  Was at church singing in choir, had a witnessed collapse.  EMS was called on fire arrival the patient was agonal with weak pulses. On EMS arrival the patient was pulseless CPR was initiated, initial rhythm VF, ACLS interventions included epinephrine x 2, defibrillation x 5, amiodarone 300 mg.  An IO was placed in the right humerus she was intubated return of spontaneous circulation estimated at 15 minutes On arrival to [facility] initial EKG negative for ST elevation white blood cell count 11.7 glucose 408 post CPR lactic acid 10 Cardiology consulted by EDP Critical care asked to admit.  Pertinent Medical History:  Coronary artery disease, with prior angina seen by cardiology last in January 2025 type 2 diabetes with retinopathy, last A1c of record 9.5, hypertension, hyperlipidemia Remote herniated disc Obesity  Significant Hospital Events: Including procedures, antibiotic start and stop dates in addition to other pertinent events   2/9 Admitted status post VF arrest time to return of spontaneous circulation estimated at 15 minutes hemodynamically stable on arrival to the ER decorticate posturing 2/10: con't TTM, started on cleviprex  2/11: no change in mental status, needs off sedation for neuro eval 2/12: tachypnea/dysynchrony overnight on vent requiring prop back on at 15. On ttm overnight. Still on cleviprex  2/13: increased vent requirements overnight. Had mucous plug lavaged out. MRI equivocal for anoxic injury. Broadening abx, starting milrinone, lasix, steroids, intermittent paralytic.  2/14: coming down on vent settings,  remains on sedation   Interim History / Subjective:  Remain in sinus rhythm on amiodarone at 30 mg/h  Remained afebrile On milrinone 0.375 Off vasopressors FiO2 was titrated down to 50%  Objective:  Blood pressure 118/64, pulse 89, temperature 98.2 F (36.8 C), temperature source Bladder, resp. rate 17, height 5\' 7"  (1.702 m), weight 85.3 kg, last menstrual period 03/25/2016, SpO2 96%. CVP:  [8 mmHg-17 mmHg] 9 mmHg  Vent Mode: PRVC FiO2 (%):  [50 %-60 %] 50 % Set Rate:  [30 bmp] 30 bmp Vt Set:  [360 mL] 360 mL PEEP:  [5 cmH20-10 cmH20] 5 cmH20 Plateau Pressure:  [25 cmH20] 25 cmH20   Intake/Output Summary (Last 24 hours) at 04/08/2023 1324 Last data filed at 04/08/2023 0800 Gross per 24 hour  Intake 2876.51 ml  Output 3300 ml  Net -423.49 ml   Filed Weights   04/06/23 0500 04/07/23 0704 04/08/23 0500  Weight: 89.8 kg 90.5 kg 85.3 kg   Physical Examination: General: Crtitically ill-appearing female, orally intubated HEENT: [place]/AT, eyes anicteric.  ETT and OGT in place Neuro: Eyes closed, does not open, pupils 3 mm, nonreactive to light, positive cough, gag and breathing over the vent Chest: Coarse breath sounds, no wheezes or rhonchi Heart: Regular rate and rhythm, no murmurs or gallops Abdomen: Soft, nondistended, bowel sounds present Skin: No rash.  On TTM with low water temperature   Coox 59% Milrinone at 0.375 Respiratory culture growing gram-positive cocci COVID-positive  Net -470 cc in last 24 hours  Resolved Hospital Problem List:  Lactic acidosis Status post cardiac arrest shock Assessment & Plan:  Status post V-fib cardiac arrest Demand cardiac ischemia versus acute NSTEMI Known coronary artery disease  Acute HFrEF and HFpEF with cardiogenic shock Echo 2/10 with EF 40-45%, global hypokinesis, mild LVH, G1DD Coox 59% On milrinone 0.375 Continue amiodarone, currently in sinus rhythm Continue TTM protocol Continue BuSpar to prevent shivering She is off  vasopressors No more signs of arrhythmia Continue aspirin, statin and Zetia Continue Lasix 80 mg twice daily Monitor intake and output Advanced heart failure team is following Patient will require ischemic workup, depending upon neurological recovery  Acute respiratory failure following cardiopulmonary arrest COVID-19 pneumonia Aspiration pneumonitis Patient's respiratory status decline initially but now she is slowly improving FiO2 was titrated down to 50% and PEEP to 8 She is tolerating spontaneous breathing trial Continue lung protective ventilation VAP prevention bundle in place PAD protocol with fentanyl and transition from propofol to Precedex Respiratory culture is growing gram-positive cocci Continue IV Unasyn Water temperature on Arctic sun is low at 21 C, consistent with patient is spiking fever Decrease Solu-Cortef 100 mg daily  Acute metabolic encephalopathy status postcardiac arrest concerning for anoxic brain injury CT Head 2/9 NAICA No significant improvement in mental status when off sedation. MRI 2/13 equivocal Switching propofol to Precedex to see if she wakes up more, she had does have brainstem reflexes  Diabetes type II with hyperglycemia Blood sugars are better controlled Continue insulin with CBG goal 140-180  Hypertension  Blood pressure remain elevated Hold antihypertensive meds for now  Obesity Diet and exercise counseling as appropriate  Acute kidney injury due to ischemic ATN from cardiac arrest Hypernatremia Monitor intake and output Avoid nephrotoxic agent Continue free water flushes   Best Practice (right click and "Reselect all SmartList Selections" daily)   Diet/type: Tube feeds DVT prophylaxis LMWH Pressure ulcer(s): N/A GI prophylaxis: H2B Lines: Central line, Arterial Line, and yes and it is still needed Foley:  Yes, and it is still needed Code Status:  full code Last date of multidisciplinary goals of care discussion: updated  husband at bedside this AM  The patient is critically ill due to status post V-fib cardiac arrest/aspiration pneumonia.  Critical care was necessary to treat or prevent imminent or life-threatening deterioration.  Critical care was time spent personally by me on the following activities: development of treatment plan with patient and/or surrogate as well as nursing, discussions with consultants, evaluation of patient's response to treatment, examination of patient, obtaining history from patient or surrogate, ordering and performing treatments and interventions, ordering and review of laboratory studies, ordering and review of radiographic studies, pulse oximetry, re-evaluation of patient's condition and participation in multidisciplinary rounds.   During this encounter critical care time was devoted to patient care services described in this note for 42 minutes.     Cheri Fowler, MD [facility] See Amion for pager If no response to pager, please call [phone] until 7pm After 7pm, Please call E-link [phone]

## 2023-04-08 NOTE — Plan of Care (Signed)
  Problem: Health Behavior/Discharge Planning: Goal: Ability to identify and utilize available resources and services will improve Outcome: Progressing Goal: Ability to manage health-related needs will improve Outcome: Progressing   Problem: Metabolic: Goal: Ability to maintain appropriate glucose levels will improve Outcome: Progressing   Problem: Skin Integrity: Goal: Risk for impaired skin integrity will decrease Outcome: Progressing   Problem: Tissue Perfusion: Goal: Adequacy of tissue perfusion will improve Outcome: Progressing   Problem: Skin Integrity: Goal: Risk for impaired skin integrity will be minimized. Outcome: Progressing   Problem: Neurologic: Goal: Promote progressive neurologic recovery Outcome: Progressing   Problem: Respiratory: Goal: Will maintain a patent airway Outcome: Progressing Goal: Complications related to the disease process, condition or treatment will be avoided or minimized Outcome: Progressing   Problem: Clinical Measurements: Goal: Ability to maintain clinical measurements within normal limits will improve Outcome: Progressing Goal: Will remain free from infection Outcome: Progressing Goal: Diagnostic test results will improve Outcome: Progressing Goal: Respiratory complications will improve Outcome: Progressing Goal: Cardiovascular complication will be avoided Outcome: Progressing

## 2023-04-08 NOTE — Progress Notes (Signed)
 Patient ID: LATRICIA CERRITO, female   DOB: [birth year], 58 y.o.   MRN: [id number]     Advanced Heart Failure Rounding Note  Cardiologist: Tessa Lerner, DO  HF Cardiologist: Dr. Shirlee Latch  Chief Complaint: OOH VF arrest Subjective:    Co-ox 59% on milrinone 0.375.   NSR on amiodarone 30 mg/hr.   CVP 8 today, I/Os mildly negative yesterday with Lasix 80 mg IV bid + metolazone x 1.   She remains unresponsive but sedated on Propofol.    Objective:    Weight Range: 85.3 kg Body mass index is 29.45 kg/m.   Vital Signs:   Temp:  [98.1 F (36.7 C)-99.9 F (37.7 C)] 98.4 F (36.9 C) (02/15 0700) Pulse Rate:  [78-118] 83 (02/15 0630) Resp:  [8-33] 33 (02/15 0630) BP: (109-132)/(61-105) 128/74 (02/15 0400) SpO2:  [93 %-100 %] 99 % (02/15 0630) FiO2 (%):  [50 %-60 %] 50 % (02/15 0445) Weight:  [85.3 kg] 85.3 kg (02/15 0500) Last BM Date : 04/07/23  Weight change: Filed Weights   04/06/23 0500 04/07/23 0704 04/08/23 0500  Weight: 89.8 kg 90.5 kg 85.3 kg   Intake/Output:   Intake/Output Summary (Last 24 hours) at 04/08/2023 0750 Last data filed at 04/08/2023 0600 Gross per 24 hour  Intake 2556.8 ml  Output 3300 ml  Net -743.2 ml  CVP 8  Physical Exam   General: Sedated on vent.  Neck: No JVD, no thyromegaly or thyroid nodule.  Lungs: Clear to auscultation bilaterally with normal respiratory effort. CV: Nondisplaced PMI.  Heart regular S1/S2, no S3/S4, no murmur.  No peripheral edema.  No carotid bruit.  Normal pedal pulses.  Abdomen: Soft, nontender, no hepatosplenomegaly, no distention.  Skin: Intact without lesions or rashes.  Neurologic: Not responsive Extremities: No clubbing or cyanosis.  HEENT: Normal.   Telemetry   NSR 100s  Labs    CBC Recent Labs    04/06/23 0517 04/06/23 0657 04/07/23 0359 04/07/23 0745 04/07/23 1520  WBC 14.4*  --  13.1*  --   --   HGB 12.5   < > 10.5* 9.9* 9.2*  HCT 38.8   < > 32.3* 29.0* 27.0*  MCV 80.3  --  81.0  --   --    PLT 241  --  208  --   --    < > = values in this interval not displayed.   Basic Metabolic Panel Recent Labs    78/29/56 0517 04/06/23 0657 04/07/23 0359 04/07/23 0745 04/07/23 1520 04/08/23 0325  NA 149*   < > 146*   < > 147* 144  K 3.6   < > 5.0   < > 4.2 3.6  CL 110  --  111  --   --  108  CO2 20*  --  24  --   --  27  GLUCOSE 268*  --  115*  --   --  155*  BUN 29*  --  43*  --   --  52*  CREATININE 0.99  --  1.19*  --   --  1.18*  CALCIUM 10.0  --  9.1  --   --  8.8*  MG 2.3  --  2.4  --   --   --   PHOS 3.5  --  4.7*  --   --   --    < > = values in this interval not displayed.   Liver Function Tests Recent Labs    04/06/23 0517 04/07/23 0359  AST 92* 83*  ALT 102* 78*  ALKPHOS 338* 293*  BILITOT 0.8 1.1  PROT 6.6 6.4*  ALBUMIN 2.8* 2.8*   No results for input(s): "LIPASE", "AMYLASE" in the last 72 hours. Cardiac Enzymes No results for input(s): "CKTOTAL", "CKMB", "CKMBINDEX", "TROPONINI" in the last 72 hours.  BNP: BNP (last 3 results) No results for input(s): "BNP" in the last 8760 hours.  ProBNP (last 3 results) No results for input(s): "PROBNP" in the last 8760 hours.  D-Dimer No results for input(s): "DDIMER" in the last 72 hours. Hemoglobin A1C No results for input(s): "HGBA1C" in the last 72 hours.  Fasting Lipid Panel Recent Labs    04/07/23 0359  TRIG 86   Thyroid Function Tests No results for input(s): "TSH", "T4TOTAL", "T3FREE", "THYROIDAB" in the last 72 hours.  Invalid input(s): "FREET3"  Other results:  Imaging   No results found.  Medications:    Scheduled Medications:  aspirin  81 mg Per Tube Daily   atorvastatin  80 mg Per Tube Daily   busPIRone  15 mg Per Tube TID   Chlorhexidine Gluconate Cloth  6 each Topical Daily   docusate  100 mg Per Tube BID   enoxaparin (LOVENOX) injection  40 mg Subcutaneous Q24H   ezetimibe  10 mg Per Tube Daily   famotidine  20 mg Per Tube BID   feeding supplement (PROSource TF20)  60  mL Per Tube Daily   free water  100 mL Per Tube Q2H   hydrocortisone sod succinate (SOLU-CORTEF) inj  100 mg Intravenous Q12H   insulin aspart  0-20 Units Subcutaneous Q4H   insulin glargine-yfgn  22 Units Subcutaneous BID   metoCLOPramide (REGLAN) injection  5 mg Intravenous Q8H   mouth rinse  15 mL Mouth Rinse Q2H   polyethylene glycol  17 g Per Tube Daily    Infusions:  amiodarone 30 mg/hr (04/08/23 0600)   ampicillin-sulbactam (UNASYN) IV 3 g (04/08/23 0605)   feeding supplement (VITAL 1.5 CAL) 20 mL/hr at 04/08/23 0600   fentaNYL infusion INTRAVENOUS 50 mcg/hr (04/08/23 0600)   milrinone 0.375 mcg/kg/min (04/08/23 0600)   norepinephrine (LEVOPHED) Adult infusion Stopped (04/06/23 1359)   propofol (DIPRIVAN) infusion 40 mcg/kg/min (04/08/23 0615)    PRN Medications: acetaminophen **OR** acetaminophen (TYLENOL) oral liquid 160 mg/5 mL **OR** acetaminophen, dextrose, fentaNYL (SUBLIMAZE) injection, ipratropium-albuterol, ondansetron (ZOFRAN) IV, mouth rinse  Patient Profile   Samanth Mirkin is a 58 year old with past medical history of NSTEMI in 2013 s/p PCI to LAD, CAD, hypertension, hyperlipidemia, type 2 diabetes, PAD, and neurogenic lumbar claudication, and obesity.   Admitted with OOH VF arrest.  Assessment/Plan   1. VF arrest: Witnessed arrest but was down an unclear amount of time pre-CPR.  When EMS arrived, had 15 minutes CPR + shock x 4 with ROSC.  Cause of arrest uncertain, no chest pain prior but TnI 9509.  No further arrhythmias.  - Continue amiodarone gtt 30/hr 2. CAD: History of CAD with PCI to LAD in 2013.  No chest pain prior to event but HS-TnI elevated 9509, ?demand ischemia with prolonged shock vs ACS. ECG without STEMI, nonspecific changes.  - on Lovenox at DVT dose - atorvastatin 80 daily - ASA per tube - If she has neurologic recovery, will need coronary angiography.  3. Cardiogenic shock: Echo with EF 40-45%, normal RV.  Previous echo was in 2013 with  normal EF.  Co-ox 59%, milrinone 0.375.  I/Os net negative yesterday, CVP 8 today.  - Lasix 80 mg IV  bid today to keep at least net even.   - Continue  Milrinone 0.375 mcg/kg/min 4. Elevated LFTs: Suspect shock liver. LFTs trending down.  5. DKA: Poorly controlled DM2, A1c 10.9.  Beta hydroxybutyrate decreased to normal.  - Now on [place] insulin.  6. COVID+: Supportive care.  7. Acute hypoxemic respiratory failure: Suspect aspiration PNA.  Unasyn for aspiration coverage.  Vent per CCM.  - sedation for vent compliance and oxygenation per CCM 8. 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 Brain MRI equivocal for hypoxic ischemic injury.  - Need to wean sedation for neurological prognostication, start weaning Propofol this morning.  9. ID: No recurrent fever overnight. PCT trended down to 0.8.  COVID+ but also with suspected aspiration PNA/pneumonitis. Cultures NGTD. -  Now Unasyn.   Length of Stay: 4  CRITICAL CARE Performed by: Marca Ancona  Total critical care time: 35 minutes  Critical care time was exclusive of separately billable procedures and treating other patients.  Critical care was necessary to treat or prevent imminent or life-threatening deterioration.  Critical care was time spent personally by me on the following activities: development of treatment plan with patient and/or surrogate as well as nursing, discussions with consultants, evaluation of patient's response to treatment, examination of patient, obtaining history from patient or surrogate, ordering and performing treatments and interventions, ordering and review of laboratory studies, ordering and review of radiographic studies, pulse oximetry and re-evaluation of patient's condition.  Marca Ancona 04/08/2023 7:50 AM

## 2023-04-09 ENCOUNTER — Inpatient Hospital Stay (HOSPITAL_COMMUNITY): Payer: Managed Care, Other (non HMO)

## 2023-04-09 DIAGNOSIS — I4901 Ventricular fibrillation: Secondary | ICD-10-CM | POA: Diagnosis not present

## 2023-04-09 DIAGNOSIS — R570 Cardiogenic shock: Secondary | ICD-10-CM | POA: Diagnosis not present

## 2023-04-09 DIAGNOSIS — I469 Cardiac arrest, cause unspecified: Secondary | ICD-10-CM | POA: Diagnosis not present

## 2023-04-09 DIAGNOSIS — J690 Pneumonitis due to inhalation of food and vomit: Secondary | ICD-10-CM | POA: Diagnosis not present

## 2023-04-09 DIAGNOSIS — Z8679 Personal history of other diseases of the circulatory system: Secondary | ICD-10-CM | POA: Diagnosis not present

## 2023-04-09 DIAGNOSIS — J9600 Acute respiratory failure, unspecified whether with hypoxia or hypercapnia: Secondary | ICD-10-CM | POA: Diagnosis not present

## 2023-04-09 DIAGNOSIS — G931 Anoxic brain damage, not elsewhere classified: Secondary | ICD-10-CM | POA: Diagnosis not present

## 2023-04-09 DIAGNOSIS — I2510 Atherosclerotic heart disease of native coronary artery without angina pectoris: Secondary | ICD-10-CM | POA: Diagnosis not present

## 2023-04-09 DIAGNOSIS — U071 COVID-19: Secondary | ICD-10-CM | POA: Diagnosis not present

## 2023-04-09 LAB — CBC WITH DIFFERENTIAL/PLATELET
Abs Immature Granulocytes: 0.21 10*3/uL — ABNORMAL HIGH (ref 0.00–0.07)
Basophils Absolute: 0 10*3/uL (ref 0.0–0.1)
Basophils Relative: 0 %
Eosinophils Absolute: 0.5 10*3/uL (ref 0.0–0.5)
Eosinophils Relative: 6 %
HCT: 28.4 % — ABNORMAL LOW (ref 36.0–46.0)
Hemoglobin: 9.1 g/dL — ABNORMAL LOW (ref 12.0–15.0)
Immature Granulocytes: 2 %
Lymphocytes Relative: 24 %
Lymphs Abs: 2.2 10*3/uL (ref 0.7–4.0)
MCH: 26.1 pg (ref 26.0–34.0)
MCHC: 32 g/dL (ref 30.0–36.0)
MCV: 81.4 fL (ref 80.0–100.0)
Monocytes Absolute: 0.8 10*3/uL (ref 0.1–1.0)
Monocytes Relative: 9 %
Neutro Abs: 5.2 10*3/uL (ref 1.7–7.7)
Neutrophils Relative %: 59 %
Platelets: 184 10*3/uL (ref 150–400)
RBC: 3.49 MIL/uL — ABNORMAL LOW (ref 3.87–5.11)
RDW: 14.6 % (ref 11.5–15.5)
WBC: 8.9 10*3/uL (ref 4.0–10.5)
nRBC: 1.2 % — ABNORMAL HIGH (ref 0.0–0.2)

## 2023-04-09 LAB — COOXEMETRY PANEL
Carboxyhemoglobin: 1.2 % (ref 0.5–1.5)
Carboxyhemoglobin: 1.8 % — ABNORMAL HIGH (ref 0.5–1.5)
Methemoglobin: <0.7 % (ref 0.0–1.5)
Methemoglobin: <0.7 % (ref 0.0–1.5)
O2 Saturation: 78.5 %
O2 Saturation: 79.8 %
Total hemoglobin: 9.5 g/dL — ABNORMAL LOW (ref 12.0–16.0)
Total hemoglobin: 10 g/dL — ABNORMAL LOW (ref 12.0–16.0)

## 2023-04-09 LAB — BLOOD GAS, VENOUS
Acid-Base Excess: 10.9 mmol/L — ABNORMAL HIGH (ref 0.0–2.0)
Bicarbonate: 36.4 mmol/L — ABNORMAL HIGH (ref 20.0–28.0)
O2 Saturation: 80.1 %
Patient temperature: 37
pCO2, Ven: 50 mm[Hg] (ref 44–60)
pH, Ven: 7.47 — ABNORMAL HIGH (ref 7.25–7.43)
pO2, Ven: 47 mm[Hg] — ABNORMAL HIGH (ref 32–45)

## 2023-04-09 LAB — COMPREHENSIVE METABOLIC PANEL
ALT: 48 U/L — ABNORMAL HIGH (ref 0–44)
AST: 41 U/L (ref 15–41)
Albumin: 2.2 g/dL — ABNORMAL LOW (ref 3.5–5.0)
Alkaline Phosphatase: 248 U/L — ABNORMAL HIGH (ref 38–126)
Anion gap: 13 (ref 5–15)
BUN: 53 mg/dL — ABNORMAL HIGH (ref 6–20)
CO2: 31 mmol/L (ref 22–32)
Calcium: 9 mg/dL (ref 8.9–10.3)
Chloride: 100 mmol/L (ref 98–111)
Creatinine, Ser: 1.03 mg/dL — ABNORMAL HIGH (ref 0.44–1.00)
GFR, Estimated: >60 mL/min (ref >60)
Glucose, Bld: 140 mg/dL — ABNORMAL HIGH (ref 70–99)
Potassium: 2.7 mmol/L — CL (ref 3.5–5.1)
Sodium: 144 mmol/L (ref 135–145)
Total Bilirubin: 0.5 mg/dL (ref 0.0–1.2)
Total Protein: 5.9 g/dL — ABNORMAL LOW (ref 6.5–8.1)

## 2023-04-09 LAB — MAGNESIUM: Magnesium: 2.1 mg/dL (ref 1.7–2.4)

## 2023-04-09 LAB — GLUCOSE, CAPILLARY
Glucose-Capillary: 151 mg/dL — ABNORMAL HIGH (ref 70–99)
Glucose-Capillary: 126 mg/dL — ABNORMAL HIGH (ref 70–99)
Glucose-Capillary: 224 mg/dL — ABNORMAL HIGH (ref 70–99)
Glucose-Capillary: 214 mg/dL — ABNORMAL HIGH (ref 70–99)
Glucose-Capillary: 119 mg/dL — ABNORMAL HIGH (ref 70–99)
Glucose-Capillary: 270 mg/dL — ABNORMAL HIGH (ref 70–99)

## 2023-04-09 LAB — POTASSIUM: Potassium: 3.7 mmol/L (ref 3.5–5.1)

## 2023-04-09 MED ORDER — POTASSIUM CHLORIDE 20 MEQ PO PACK
40.0000 meq | PACK | Freq: Once | ORAL | Status: AC
  Administered 2023-04-09: 40 meq
  Filled 2023-04-09: qty 2

## 2023-04-09 MED ORDER — MILRINONE LACTATE IN DEXTROSE 20-5 MG/100ML-% IV SOLN: 0.2000 ug/kg/min | INTRAVENOUS | Status: DC

## 2023-04-09 MED ORDER — NOREPINEPHRINE 4 MG/250ML-% IV SOLN
INTRAVENOUS | Status: AC
  Administered 2023-04-09: 2 ug/min via INTRAVENOUS
  Filled 2023-04-09: qty 250

## 2023-04-09 MED ORDER — POTASSIUM CHLORIDE 10 MEQ/50ML IV SOLN
10.0000 meq | INTRAVENOUS | Status: AC
  Administered 2023-04-09 (×4): 10 meq via INTRAVENOUS
  Filled 2023-04-09 (×4): qty 50

## 2023-04-09 MED ORDER — NOREPINEPHRINE 4 MG/250ML-% IV SOLN: 0.0000 ug/min | INTRAVENOUS | Status: DC

## 2023-04-09 MED ORDER — GADOBUTROL 1 MMOL/ML IV SOLN
9.0000 mL | Freq: Once | INTRAVENOUS | Status: AC | PRN
  Administered 2023-04-09: 9 mL via INTRAVENOUS

## 2023-04-09 MED ORDER — FUROSEMIDE 10 MG/ML IJ SOLN
80.0000 mg | Freq: Two times a day (BID) | INTRAMUSCULAR | Status: AC
  Administered 2023-04-09 (×2): 80 mg via INTRAVENOUS
  Filled 2023-04-09 (×2): qty 8

## 2023-04-09 MED ORDER — LABETALOL HCL 5 MG/ML IV SOLN: 10.0000 mg | Freq: Four times a day (QID) | INTRAVENOUS | Status: DC | PRN

## 2023-04-09 NOTE — Progress Notes (Addendum)
 eLink Physician-Brief Progress Note Patient Name: Vickie Little DOB: 1965-04-24 MRN: [id number]   Date of Service  04/09/2023  HPI/Events of Note  Hypotension.  Blood pressure of 85/44 with a MAP of 56.  Patient is intubated and sedated.  She is also on milrinone.  Central venous oxygen saturation this morning was 78%.  eICU Interventions  Patient's chart reviewed.  Spoke to patient's nurse.  Will order norepinephrine to maintain a mean arterial pressure greater than 65.  Will repeat the central venous oxygen saturation and if there is no cardiogenic shock, we will begin weaning off milrinone to decrease hypotension and tachycardia.     Intervention Category Major Interventions: Hypotension - evaluation and management  [name] 04/09/2023, 8:25 PM ----------------------------- Addendum: Repeat central venous oxygen saturation is 79.8%.  Decreased milrinone from 0.375 mcg/kg/min to 0.2 mcg/kg/min.  Repeated central venous oxygen saturation at 2 AM.  Based on results, it would either be weaned off further or discontinued.  Discussed with bedside RN. ----------------------------- Central venous oxygen saturation at 2 AM is 75.4%.  Patient is off Levophed and heart rate is down from the 110s into the 60s. Will shut milrinone of and repeat another venous oxygen saturation in 2 hours.  If it remains good and patient does not develop hemodynamic instability, milrinone will be kept off. Discussed with bedside RN. ---------------------------- Central venous oxygen saturation after turning off milrinone is 76.8%.  Patient remains hemodynamically stable.  Continue to monitor off milrinone. Discussed with bedside RN.

## 2023-04-09 NOTE — Progress Notes (Signed)
 NAME:  MAN BONNEAU, MRN:  [id number], DOB:  [birth year], LOS: 7 ADMISSION DATE:  04/02/2023, CONSULTATION DATE:  04/02/2023 REFERRING MD:  Denton Lank - EDP, CHIEF COMPLAINT: Cardiac arrest    History of Present Illness:  58 year old female patient with history of metabolic syndrome, and coronary artery disease followed by cardiology last seen in January 2025.  Was at church singing in choir, had a witnessed collapse.  EMS was called on fire arrival the patient was agonal with weak pulses. On EMS arrival the patient was pulseless CPR was initiated, initial rhythm VF, ACLS interventions included epinephrine x 2, defibrillation x 5, amiodarone 300 mg.  An IO was placed in the right humerus she was intubated return of spontaneous circulation estimated at 15 minutes On arrival to [facility] initial EKG negative for ST elevation white blood cell count 11.7 glucose 408 post CPR lactic acid 10 Cardiology consulted by EDP Critical care asked to admit.  Pertinent Medical History:  Coronary artery disease, with prior angina seen by cardiology last in January 2025 type 2 diabetes with retinopathy, last A1c of record 9.5, hypertension, hyperlipidemia Remote herniated disc Obesity  Significant Hospital Events: Including procedures, antibiotic start and stop dates in addition to other pertinent events   2/9 Admitted status post VF arrest time to return of spontaneous circulation estimated at 15 minutes hemodynamically stable on arrival to the ER decorticate posturing 2/10: con't TTM, started on cleviprex  2/11: no change in mental status, needs off sedation for neuro eval 2/12: tachypnea/dysynchrony overnight on vent requiring prop back on at 15. On ttm overnight. Still on cleviprex  2/13: increased vent requirements overnight. Had mucous plug lavaged out. MRI equivocal for anoxic injury. Broadening abx, starting milrinone, lasix, steroids, intermittent paralytic.  2/14: coming down on vent settings,  remains on sedation   Interim History / Subjective:  Remain in sinus rhythm on amiodarone 830 She is hypertensive with SBP in 200s Still on milrinone at 0.375 She remained afebrile Tolerating spontaneous breathing trial but mental status precludes extubation   Objective:  Blood pressure 111/67, pulse 67, temperature 98.4 F (36.9 C), resp. rate (!) 29, height 5\' 7"  (1.702 m), weight 84.5 kg, last menstrual period 03/25/2016, SpO2 97%. CVP:  [4 mmHg-16 mmHg] 11 mmHg  Vent Mode: PRVC FiO2 (%):  [40 %-50 %] 40 % Set Rate:  [30 bmp] 30 bmp Vt Set:  [360 mL] 360 mL PEEP:  [8 cmH20] 8 cmH20 Pressure Support:  [3 cmH20] 3 cmH20 Plateau Pressure:  [15 cmH20-24 cmH20] 15 cmH20   Intake/Output Summary (Last 24 hours) at 04/09/2023 2130 Last data filed at 04/09/2023 8657 Gross per 24 hour  Intake 2150.59 ml  Output 2702 ml  Net -551.41 ml   Filed Weights   04/07/23 0704 04/08/23 0500 04/09/23 8469  Weight: 90.5 kg 85.3 kg 84.5 kg   Physical Examination: General: Crtitically ill-appearing middle-aged female, orally intubated HEENT: [place]/AT, eyes anicteric.  ETT and OGT in place Neuro: Off sedation, eyes closed, does not open, not following commands, blinking.  Pupils 3 mm bilateral reactive to light, positive cough and gag, no response to painful stimuli in all 4 extremities Chest: Coarse breath sounds, no wheezes or rhonchi Heart: Regular rate and rhythm, no murmurs or gallops Abdomen: Soft, nondistended, bowel sounds present Skin: No rash   Coox 78% Milrinone at 0.375 Respiratory culture growing gram-positive cocci COVID-positive CVP 14  Net -885 cc in last 24 hours  Resolved Hospital Problem List:  Lactic acidosis Status post  cardiac arrest shock Assessment & Plan:  Status post V-fib cardiac arrest Demand cardiac ischemia versus acute NSTEMI Known coronary artery disease  Acute HFrEF and HFpEF with cardiogenic shock Echo 2/10 with EF 40-45%, global hypokinesis, mild LVH,  G1DD Coox improved to 78%  On milrinone 0.375 Continue amiodarone, remain in sinus rhythm, telemetry showed no arrhythmia Continue TTM protocol Continue BuSpar to prevent shivering Continue aspirin, statin and Zetia Continue Lasix 80 mg twice daily Monitor intake and output, she remained net -800 cc in last 24 hours Advanced heart failure team is following Patient will require ischemic workup, depending upon neurological recovery  Acute respiratory failure with hypoxia following cardiopulmonary arrest COVID-19 pneumonia Aspiration pneumonitis Patient's respiratory status decline initially but now it has improved significantly FiO2 was titrated down to 40% She is tolerating spontaneous breathing trial but mental status precludes extubation Continue lung protective ventilation VAP prevention bundle in place She is off sedation Respiratory culture is growing gram-positive cocci Continue IV Unasyn Water temperature on Arctic sun is low at 27 C, consistent with patient is spiking fever Stop Solu-Cortef  Acute metabolic encephalopathy status postcardiac arrest concerning for anoxic brain injury CT Head 2/9 NAICA No significant improvement in mental status when off sedation. MRI 2/13 showing signs of anoxic brain injury She continued to have brainstem reflexes Will get a repeat MRI today, depends on that we will start goals of care discussion with family  Diabetes type II with hyperglycemia Blood sugars are better controlled Continue Semglee and sliding scale insulin with CBG goal 140-180  Hypertension  Blood pressure remain elevated Continue as needed hydralazine and labetalol to maintain SBP around 160  Obesity Diet and exercise counseling as appropriate  Acute kidney injury due to ischemic ATN from cardiac arrest Hypernatremia Hypokalemia Monitor intake and output Serum creatinine continue to improve Serum sodium trended down to 144, serum potassium remains at  2.7 Continue aggressive electrolyte replacement and monitor Continue free water flushes   Best Practice (right click and "Reselect all SmartList Selections" daily)   Diet/type: Tube feeds DVT prophylaxis LMWH Pressure ulcer(s): N/A GI prophylaxis: H2B Lines: Central line, Arterial Line, and yes and it is still needed Foley:  Yes, and it is still needed Code Status:  full code Last date of multidisciplinary goals of care discussion: updated husband at bedside this AM  The patient is critically ill due to status post V-fib cardiac arrest/aspiration pneumonia.  Critical care was necessary to treat or prevent imminent or life-threatening deterioration.  Critical care was time spent personally by me on the following activities: development of treatment plan with patient and/or surrogate as well as nursing, discussions with consultants, evaluation of patient's response to treatment, examination of patient, obtaining history from patient or surrogate, ordering and performing treatments and interventions, ordering and review of laboratory studies, ordering and review of radiographic studies, pulse oximetry, re-evaluation of patient's condition and participation in multidisciplinary rounds.   During this encounter critical care time was devoted to patient care services described in this note for 38 minutes.     Cheri Fowler, MD [facility] See Amion for pager If no response to pager, please call [phone] until 7pm After 7pm, Please call E-link [phone]

## 2023-04-09 NOTE — Consult Note (Signed)
 NEUROLOGY CONSULT NOTE   Date of service: April 09, 2023 Patient Name: Vickie Little MRN:  [id number] DOB:  [birth year] Chief Complaint: "anoxic brain injury" Requesting Provider: Cheri Fowler, MD  History of Present Illness  Vickie Little is a 58 y.o. female with hx of DM2, CAD who was singing in her church, collapsed and agonal breathing with weak pulses. EMS called. Initial rhythm of Vfibb arrest. She was pulseless.  She was intubated. 15 minutes of CPR with ROSC.  On arrival, CT head without contrast with no acute intracranial normalities.  She underwent TTM.  Routine EEG with no epileptiform discharges no seizure.  MRI brain on 2/13 with subtle diffusion restriction in high parasagittal frontal lobes and potentially deep gray nuclei concerning for a hypoxicischemic injury.  But the changes were so subtle that these could also have been artifactual.  She had repeat MRI brain with and without contrast on 04/09/2023 which was negative for any acute intracranial abnormalities.  Neurology was consulted to assist with prognosis and further evaluation workup.   ROS  Unable to ascertain due to intubation and sedation.  Past History   Past Medical History:  Diagnosis Date   Arthritis    CAD (coronary artery disease), native coronary artery 07/30/2018   Diabetes mellitus without complication (HCC)    type II    Heart attack (HCC)    Hypertension    NSTEMI (non-ST elevated myocardial infarction) (HCC) 02/08/2012    Past Surgical History:  Procedure Laterality Date   BREAST BIOPSY Right 01/10/2023   MM RT BREAST BX W LOC DEV 1ST LESION IMAGE BX SPEC STEREO GUIDE 01/10/2023 [facility] MAMMOGRAPHY   CORONARY ANGIOPLASTY WITH STENT PLACEMENT     LEFT HEART CATHETERIZATION WITH CORONARY ANGIOGRAM N/A 02/13/2012   Procedure: LEFT HEART CATHETERIZATION WITH CORONARY ANGIOGRAM;  Surgeon: Donato Schultz, MD;  Location: [facility] CATH LAB;  Service: Cardiovascular;  Laterality: N/A;   LUMBAR  LAMINECTOMY/DECOMPRESSION MICRODISCECTOMY Left 04/20/2016   Procedure: 1. central decompression for spinal stenosis   1.central decompression for spinal stenosis L5-S1  2.foramionotomy L5 root and S1nerve root left       3. Microdisectomy L5-S1 left  ;  Surgeon: Ranee Gosselin, MD;  Location: [facility] ORS;  Service: Orthopedics;  Laterality: Left;    Family History: Family History  Adopted: Yes  Problem Relation Age of Onset   Diabetes Father     Social History  reports that she quit smoking about 27 years ago. Her smoking use included cigarettes. She has never used smokeless tobacco. She reports current alcohol use. She reports that she does not use drugs.  No Known Allergies  Medications   Current Facility-Administered Medications:    acetaminophen (TYLENOL) tablet 650 mg, 650 mg, Oral, Q4H PRN **OR** acetaminophen (TYLENOL) 160 MG/5ML solution 650 mg, 650 mg, Per Tube, Q4H PRN, 650 mg at 04/06/23 1505 **OR** acetaminophen (TYLENOL) suppository 650 mg, 650 mg, Rectal, Q4H PRN, Simonne Martinet, NP   amiodarone (NEXTERONE PREMIX) 360-4.14 MG/200ML-% (1.8 mg/mL) IV infusion, 30 mg/hr, Intravenous, Continuous, Simonne Martinet, NP, Last Rate: 16.67 mL/hr at 04/09/23 1500, 30 mg/hr at 04/09/23 1500   Ampicillin-Sulbactam (UNASYN) 3 g in sodium chloride 0.9 % 100 mL IVPB, 3 g, Intravenous, Q8H, Chand, Garnet Sierras, MD, Stopped at 04/09/23 0532   aspirin chewable tablet 81 mg, 81 mg, Per Tube, Daily, Simonne Martinet, NP, 81 mg at 04/09/23 6010   atorvastatin (LIPITOR) tablet 80 mg, 80 mg, Per Tube, Daily, Laurey Morale, MD, 80 mg  at 04/09/23 0921   busPIRone (BUSPAR) tablet 15 mg, 15 mg, Per Tube, TID, Cheri Fowler, MD, 15 mg at 04/09/23 1640   Chlorhexidine Gluconate Cloth 2 % PADS 6 each, 6 each, Topical, Daily, Mannam, Praveen, MD, 6 each at 04/09/23 0950   dexmedetomidine (PRECEDEX) 400 MCG/100ML (4 mcg/mL) infusion, 0-1.2 mcg/kg/hr, Intravenous, Titrated, Chand, Garnet Sierras, MD, Last Rate: 25.6 mL/hr  at 04/09/23 1545, 1.2 mcg/kg/hr at 04/09/23 1545   dextrose 50 % solution 0-50 mL, 0-50 mL, Intravenous, PRN, Simonne Martinet, NP   docusate (COLACE) 50 MG/5ML liquid 100 mg, 100 mg, Per Tube, BID, Cherlynn Polo, Lauren E, PA-C, 100 mg at 04/09/23 0921   enoxaparin (LOVENOX) injection 40 mg, 40 mg, Subcutaneous, Q24H, Andrey Farmer, PA-C, 40 mg at 04/09/23 0920   ezetimibe (ZETIA) tablet 10 mg, 10 mg, Per Tube, Daily, Simonne Martinet, NP, 10 mg at 04/09/23 7846   famotidine (PEPCID) tablet 20 mg, 20 mg, Per Tube, BID, Simonne Martinet, NP, 20 mg at 04/09/23 9629   feeding supplement (PROSource TF20) liquid 60 mL, 60 mL, Per Tube, Daily, Cherlynn Polo, Lauren E, PA-C, 60 mL at 04/09/23 5284   feeding supplement (VITAL 1.5 CAL) liquid 1,000 mL, 1,000 mL, Per Tube, Continuous, Cherlynn Polo, Lauren E, PA-C, Last Rate: 20 mL/hr at 04/09/23 1547, 1,000 mL at 04/09/23 1547   fentaNYL (SUBLIMAZE) injection 50-200 mcg, 50-200 mcg, Intravenous, Q30 min PRN, Cherlynn Polo, Lauren E, PA-C, 100 mcg at 04/07/23 0409   fentaNYL in NS (67mcg/ml) infusion-PREMIX, 50-200 mcg/hr, Intravenous, Continuous, Autry, Lauren E, PA-C, Last Rate: 7.5 mL/hr at 04/09/23 1257, 75 mcg/hr at 04/09/23 1257   free water 100 mL, 100 mL, Per Tube, Q2H, Clark, Laura P, DO, 100 mL at 04/09/23 1639   furosemide (LASIX) injection 80 mg, 80 mg, Intravenous, BID, Laurey Morale, MD, 80 mg at 04/09/23 0945   hydrALAZINE (APRESOLINE) injection 10 mg, 10 mg, Intravenous, Q4H PRN, Cheri Fowler, MD, 10 mg at 04/09/23 0945   insulin aspart (novoLOG) injection 0-20 Units, 0-20 Units, Subcutaneous, Q4H, Autry, Lauren E, PA-C, 7 Units at 04/09/23 1639   insulin glargine-yfgn (SEMGLEE) injection 22 Units, 22 Units, Subcutaneous, BID, Steffanie Dunn, DO, 22 Units at 04/09/23 0945   ipratropium-albuterol (DUONEB) 0.5-2.5 (3) MG/3ML nebulizer solution 3 mL, 3 mL, Nebulization, Q4H PRN, Karie Fetch P, DO, 3 mL at 04/06/23 0907   labetalol (NORMODYNE)  injection 10 mg, 10 mg, Intravenous, Q6H PRN, Cheri Fowler, MD   metoCLOPramide (REGLAN) injection 5 mg, 5 mg, Intravenous, Q8H, Chand, Sudham, MD, 5 mg at 04/09/23 0502   milrinone (PRIMACOR) 20 MG/100 ML (0.2 mg/mL) infusion, 0.375 mcg/kg/min, Intravenous, Continuous, Laurey Morale, MD, Last Rate: 10.1 mL/hr at 04/09/23 1500, 0.375 mcg/kg/min at 04/09/23 1500   ondansetron (ZOFRAN) injection 4 mg, 4 mg, Intravenous, Q6H PRN, Simonne Martinet, NP   Oral care mouth rinse, 15 mL, Mouth Rinse, Q2H, Mannam, Praveen, MD, 15 mL at 04/09/23 1640   Oral care mouth rinse, 15 mL, Mouth Rinse, PRN, Mannam, Praveen, MD   polyethylene glycol (MIRALAX / GLYCOLAX) packet 17 g, 17 g, Per Tube, Daily, Cristopher Peru, PA-C, 17 g at 04/09/23 0921  Vitals   Vitals:   04/09/23 1145 04/09/23 1200 04/09/23 1459 04/09/23 1600  BP:  123/73 118/61   Pulse: (!) 107 (!) 107  63  Resp: (!) 25 (!) 32 (!) 34 (!) 30  Temp:      TempSrc:      SpO2: 93% 92%  92%   Weight:      Height:        Body mass index is 29.18 kg/m.  Physical Exam   General: Laying comfortably in bed; in no acute distress.  HENT: Normal oropharynx and mucosa. Normal external appearance of ears and nose.  Neck: Supple, no pain or tenderness  CV: No JVD. No peripheral edema.  Pulmonary: Symmetric Chest rise.  Breathing over vent. Abdomen: Soft to touch, non-tender.  Ext: No cyanosis, edema, or deformity  Skin: No rash. Normal palpation of skin.   Musculoskeletal: Normal digits and nails by inspection. No clubbing.   Neurologic Examination  Mental status/Cognition: No response to voice or loud clap or tactile stimulation.   Speech/language: Mute, no speech, no attempts to communicate.  Does not follow commands. Cranial nerves:   CN II Pupils equal and reactive to light, unable to assess visual field deficit.   CN III,IV,VI EOMI intact to doll's eye.   CN V Corneals intact bilaterally   CN VII Facial diplegia.  No facial movement  noted to noxious stimuli.  No facial grimace noted.   CN VIII Does not turn head towards speech.   CN IX & X Cough is intact.  Unable to elicit a gag.   CN XI Head is midline.  No head movement noted to noxious stimuli.   CN XII Tongue pushed of the floor of the mouth by ETT and hardware.   Sensory/Motor:  Muscle bulk: normal, tone flaccid in all extremities. No response to proximal pinch in any of the extremities.   Coordination/Complex Motor:  Unable to assess. Labs/Imaging/Neurodiagnostic studies   CBC:  Recent Labs  Lab 05-03-2023 0359 May 03, 2023 0745 04/08/23 0840 04/09/23 0512  WBC 13.1*  --   --  8.9  NEUTROABS  --   --   --  5.2  HGB 10.5*   < > 9.9* 9.1*  HCT 32.3*   < > 29.0* 28.4*  MCV 81.0  --   --  81.4  PLT 208  --   --  184   < > = values in this interval not displayed.   Basic Metabolic Panel:  Lab Results  Component Value Date   NA 144 04/09/2023   K 3.7 04/09/2023   CO2 31 04/09/2023   GLUCOSE 140 (H) 04/09/2023   BUN 53 (H) 04/09/2023   CREATININE 1.03 (H) 04/09/2023   CALCIUM 9.0 04/09/2023   GFRNONAA >60 04/09/2023   GFRAA >60 04/15/2016   Lipid Panel:  Lab Results  Component Value Date   LDLCALC 90 08/04/2014   HgbA1c:  Lab Results  Component Value Date   HGBA1C 9.2 (H) 04/02/2023   Urine Drug Screen:     Component Value Date/Time   LABOPIA NONE DETECTED 04/03/2023 0234   COCAINSCRNUR NONE DETECTED 04/03/2023 0234   LABBENZ POSITIVE (A) 04/03/2023 0234   AMPHETMU NONE DETECTED 04/03/2023 0234   THCU NONE DETECTED 04/03/2023 0234   LABBARB NONE DETECTED 04/03/2023 0234    Alcohol Level No results found for: "ETH" INR  Lab Results  Component Value Date   INR 1.03 04/15/2016   APTT  Lab Results  Component Value Date   APTT 29 04/15/2016   AED levels: No results found for: "PHENYTOIN", "ZONISAMIDE", "LAMOTRIGINE", "LEVETIRACETA"  2/9 CT Head without contrast(Personally reviewed): No acute intracranial process.   MRI  Brain(Personally reviewed): 2/16: Previously seen subtle restricted diffusion has resolved. No evidence of acute intracranial abnormality. 2/13: Subtle/equivocal restricted diffusion involving the high parasagittal  frontal lobes and potentially the deep gray nuclei. This could represent a hypoxic ischemic injury (given the clinical history) or artifact.   2/9 rEEG:  This EEG demonstrates evidence of a profound cerebral dysfunction as can be seen with severe hypoxic brain injury, medication induced coma, and diffuse structural injury, among other causes.    There was no seizure or seizure predisposition recorded on this study. Please note that lack of epileptiform activity on EEG does not preclude the possibility of epilepsy.  Assessment   Leaann KOURTLYN CHARLET is a 58 y.o. female with PMH significant for metabolic syndrome, CAD who had a witnessed collapse, became agonal with weak pulses. On EMS arrival, she was pulseless with VF as initial rhythm and CPR was performed for ~15 minutes before ROSC was achieved. She was intubated in the field by EMS.   TTM was started on her arrival. Required cleviprex for BP management, now off. Amiodarone gtt. HF team is following due to Acute HF and cardiogenic shock.  Patient is also COVID positive. Resp culture also positive for gram-positive cocci. Vent settings are being weaned down. Patient febrile, with improved WBC count.  She remains on precedex and fentayl.  Initial MRI with ?subtle DWI changes vs artifiact. MRI today with no abnormalities. rEEG with no seizures or epileptiform discharges. Patient's lack of responsiveness and decreased LOC seems out of proportion to the sedation that she is on. I spoke with her husband about this and I suspect that lack of any higher cerebral function this far out, is concerning.  However, we would want to get her completely off any sedation if possible to be able to do a complete and thorough neurological exam.    RECOMMENDATIONS  - wean sedation as able. - we will continue to follow along. ______________________________________________________________________    Signed, Erick Blinks, MD [facility]

## 2023-04-09 NOTE — Progress Notes (Signed)
 [facility] ADULT ICU REPLACEMENT PROTOCOL   The patient does apply for the [facility] Adult ICU Electrolyte Replacment Protocol based on the criteria listed below:   1.Exclusion criteria: TCTS, ECMO, Dialysis, and Myasthenia Gravis patients 2. Is GFR >/= 30 ml/min? Yes.    Patient's GFR today is > 60 3. Is SCr </= 2? Yes.   Patient's SCr is 1.03 mg/dL 4. Did SCr increase >/= 0.5 in 24 hours? No. 5.Pt's weight >40kg  Yes.   6. Abnormal electrolyte(s): K+ 2.7  7. Electrolytes replaced per protocol 8.  Call MD STAT for K+ </= 2.5, Phos </= 1, or Mag </= 1 Physician:  Dr Shela Commons. Lynnae Sandhoff, Jettie Booze 04/09/2023 6:08 AM

## 2023-04-09 NOTE — Plan of Care (Signed)
  Problem: Fluid Volume: Goal: Ability to maintain a balanced intake and output will improve Outcome: Progressing   Problem: Metabolic: Goal: Ability to maintain appropriate glucose levels will improve Outcome: Progressing   Problem: Skin Integrity: Goal: Risk for impaired skin integrity will decrease Outcome: Progressing   Problem: Tissue Perfusion: Goal: Adequacy of tissue perfusion will improve Outcome: Progressing   Problem: Cardiac: Goal: Ability to achieve and maintain adequate cardiopulmonary perfusion will improve Outcome: Progressing

## 2023-04-09 NOTE — Progress Notes (Signed)
 RT assisted with patient transport from 2H03 to MRI and then returned to 2H03 without complications.

## 2023-04-09 NOTE — Progress Notes (Signed)
 Patient ID: Vickie Little, female   DOB: 02/23/1965, 58 y.o.   MRN: [id number]     Advanced Heart Failure Rounding Note  Cardiologist: Tessa Lerner, DO  HF Cardiologist: Dr. Shirlee Latch  Chief Complaint: OOH VF arrest Subjective:    Co-ox 78.5% on milrinone 0.375.   NSR on amiodarone 30 mg/hr.   CVP 14-15 today, I/Os negative net 885 with weight down 2 lbs on Lasix 80 mg IV bid.  K low.   She remains unresponsive on low dose Precedex and some Fentanyl.    Objective:    Weight Range: 84.5 kg Body mass index is 29.18 kg/m.   Vital Signs:   Temp:  [97.5 F (36.4 C)-99.5 F (37.5 C)] 98.8 F (37.1 C) (02/16 0602) Pulse Rate:  [62-106] 67 (02/16 0700) Resp:  [8-42] 29 (02/16 0700) BP: (101-172)/(57-72) 111/67 (02/16 0400) SpO2:  [94 %-100 %] 97 % (02/16 0700) FiO2 (%):  [40 %-50 %] 40 % (02/16 0510) Weight:  [84.5 kg] 84.5 kg (02/16 0312) Last BM Date : 04/08/23  Weight change: Filed Weights   04/07/23 0704 04/08/23 0500 04/09/23 0312  Weight: 90.5 kg 85.3 kg 84.5 kg   Intake/Output:   Intake/Output Summary (Last 24 hours) at 04/09/2023 0749 Last data filed at 04/09/2023 0647 Gross per 24 hour  Intake 2274.48 ml  Output 3122 ml  Net -847.52 ml  CVP 14-15 Physical Exam   General: Sleeping on vent Neck: JVP 12-14, no thyromegaly or thyroid nodule.  Lungs: Clear to auscultation bilaterally with normal respiratory effort. CV: Nondisplaced PMI.  Heart regular S1/S2, no S3/S4, no murmur.  No peripheral edema.   Abdomen: Soft, nontender, no hepatosplenomegaly, no distention.  Skin: Intact without lesions or rashes.  Neurologic: No purposeful response.  Extremities: No clubbing or cyanosis.  HEENT: Normal.    Telemetry   NSR 60s  Labs    CBC Recent Labs    04/07/23 0359 04/07/23 0745 04/08/23 0840 04/09/23 0512  WBC 13.1*  --   --  8.9  NEUTROABS  --   --   --  5.2  HGB 10.5*   < > 9.9* 9.1*  HCT 32.3*   < > 29.0* 28.4*  MCV 81.0  --   --  81.4  PLT  208  --   --  184   < > = values in this interval not displayed.   Basic Metabolic Panel Recent Labs    40/34/74 0359 04/07/23 0745 04/08/23 0325 04/08/23 0840 04/09/23 0512  NA 146*   < > 144 146* 144  K 5.0   < > 3.6 3.8 2.7*  CL 111  --  108  --  100  CO2 24  --  27  --  31  GLUCOSE 115*  --  155*  --  140*  BUN 43*  --  52*  --  53*  CREATININE 1.19*  --  1.18*  --  1.03*  CALCIUM 9.1  --  8.8*  --  9.0  MG 2.4  --   --   --   --   PHOS 4.7*  --   --   --   --    < > = values in this interval not displayed.   Liver Function Tests Recent Labs    04/07/23 0359 04/09/23 0512  AST 83* 41  ALT 78* 48*  ALKPHOS 293* 248*  BILITOT 1.1 0.5  PROT 6.4* 5.9*  ALBUMIN 2.8* 2.2*   No results for input(s): "  LIPASE", "AMYLASE" in the last 72 hours. Cardiac Enzymes No results for input(s): "CKTOTAL", "CKMB", "CKMBINDEX", "TROPONINI" in the last 72 hours.  BNP: BNP (last 3 results) No results for input(s): "BNP" in the last 8760 hours.  ProBNP (last 3 results) No results for input(s): "PROBNP" in the last 8760 hours.  D-Dimer No results for input(s): "DDIMER" in the last 72 hours. Hemoglobin A1C No results for input(s): "HGBA1C" in the last 72 hours.  Fasting Lipid Panel Recent Labs    04/07/23 0359  TRIG 86   Thyroid Function Tests No results for input(s): "TSH", "T4TOTAL", "T3FREE", "THYROIDAB" in the last 72 hours.  Invalid input(s): "FREET3"  Other results:  Imaging   No results found.  Medications:    Scheduled Medications:  aspirin  81 mg Per Tube Daily   atorvastatin  80 mg Per Tube Daily   busPIRone  15 mg Per Tube TID   Chlorhexidine Gluconate Cloth  6 each Topical Daily   docusate  100 mg Per Tube BID   enoxaparin (LOVENOX) injection  40 mg Subcutaneous Q24H   ezetimibe  10 mg Per Tube Daily   famotidine  20 mg Per Tube BID   feeding supplement (PROSource TF20)  60 mL Per Tube Daily   free water  100 mL Per Tube Q2H   furosemide  80 mg  Intravenous BID   hydrocortisone sod succinate (SOLU-CORTEF) inj  100 mg Intravenous Daily   insulin aspart  0-20 Units Subcutaneous Q4H   insulin glargine-yfgn  22 Units Subcutaneous BID   metoCLOPramide (REGLAN) injection  5 mg Intravenous Q8H   mouth rinse  15 mL Mouth Rinse Q2H   polyethylene glycol  17 g Per Tube Daily    Infusions:  amiodarone 30 mg/hr (04/09/23 0647)   ampicillin-sulbactam (UNASYN) IV Stopped (04/09/23 0532)   dexmedetomidine (PRECEDEX) IV infusion 0.5 mcg/kg/hr (04/09/23 0647)   feeding supplement (VITAL 1.5 CAL) 20 mL/hr at 04/09/23 0647   fentaNYL infusion INTRAVENOUS 50 mcg/hr (04/09/23 0647)   milrinone 0.375 mcg/kg/min (04/09/23 0647)   potassium chloride 10 mEq (04/09/23 0659)    PRN Medications: acetaminophen **OR** acetaminophen (TYLENOL) oral liquid 160 mg/5 mL **OR** acetaminophen, dextrose, fentaNYL (SUBLIMAZE) injection, hydrALAZINE, ipratropium-albuterol, ondansetron (ZOFRAN) IV, mouth rinse  Patient Profile   Vickie Little is a 57 year old with past medical history of NSTEMI in 2013 s/p PCI to LAD, CAD, hypertension, hyperlipidemia, type 2 diabetes, PAD, and neurogenic lumbar claudication, and obesity.   Admitted with OOH VF arrest.  Assessment/Plan   1. VF arrest: Witnessed arrest but was down an unclear amount of time pre-CPR.  When EMS arrived, had 15 minutes CPR + shock x 4 with ROSC.  Cause of arrest uncertain, no chest pain prior but TnI 9509.  No further arrhythmias.  - Targeted temperature management with therapeutic normothermia.  - Continue amiodarone gtt 30/hr 2. CAD: History of CAD with PCI to LAD in 2013.  No chest pain prior to event but HS-TnI elevated 9509, ?demand ischemia with prolonged shock vs ACS. ECG without STEMI, nonspecific changes.  - on Lovenox at DVT dose - atorvastatin 80 daily - ASA per tube - If she has neurologic recovery, will need coronary angiography.  3. Cardiogenic shock: Echo with EF 40-45%, normal  RV.  Previous echo was in 2013 with normal EF.  Co-ox 78.5%, milrinone 0.375.  I/Os net negative yesterday with weight down 2 lbs, CVP 15 today.  - Lasix 80 mg IV bid today, replace K and repeat BMET in  pm.    - Continue  Milrinone 0.375 mcg/kg/min 4. Elevated LFTs: Suspect shock liver. LFTs trending down.  5. DKA: Poorly controlled DM2, A1c 10.9.  Beta hydroxybutyrate decreased to normal.  - Now on [place] insulin.  6. COVID+: Supportive care.  7. Acute hypoxemic respiratory failure: Suspect aspiration PNA.  Unasyn for aspiration coverage.   - per CCM 8. 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 Brain MRI equivocal for hypoxic ischemic injury.  Has brainstem reflexes, no purposeful response.  - Wean sedation off this morning and follow response.  - Will need reassessment by neurology and repeat MRI head.  9. ID: No recurrent fever overnight. PCT trended down to 0.8.  COVID+ but also with suspected aspiration PNA/pneumonitis. Cultures NGTD. -  Now Unasyn.   Length of Stay: 4  CRITICAL CARE Performed by: Marca Ancona  Total critical care time: 35 minutes  Critical care time was exclusive of separately billable procedures and treating other patients.  Critical care was necessary to treat or prevent imminent or life-threatening deterioration.  Critical care was time spent personally by me on the following activities: development of treatment plan with patient and/or surrogate as well as nursing, discussions with consultants, evaluation of patient's response to treatment, examination of patient, obtaining history from patient or surrogate, ordering and performing treatments and interventions, ordering and review of laboratory studies, ordering and review of radiographic studies, pulse oximetry and re-evaluation of patient's condition.  Marca Ancona 04/09/2023 7:49 AM

## 2023-04-10 DIAGNOSIS — I5041 Acute combined systolic (congestive) and diastolic (congestive) heart failure: Secondary | ICD-10-CM

## 2023-04-10 DIAGNOSIS — J9601 Acute respiratory failure with hypoxia: Secondary | ICD-10-CM | POA: Diagnosis not present

## 2023-04-10 DIAGNOSIS — I469 Cardiac arrest, cause unspecified: Secondary | ICD-10-CM | POA: Diagnosis not present

## 2023-04-10 DIAGNOSIS — G9341 Metabolic encephalopathy: Secondary | ICD-10-CM | POA: Diagnosis not present

## 2023-04-10 DIAGNOSIS — E119 Type 2 diabetes mellitus without complications: Secondary | ICD-10-CM

## 2023-04-10 LAB — BASIC METABOLIC PANEL
Anion gap: 14 (ref 5–15)
BUN: 57 mg/dL — ABNORMAL HIGH (ref 6–20)
CO2: 30 mmol/L (ref 22–32)
Calcium: 9.3 mg/dL (ref 8.9–10.3)
Chloride: 98 mmol/L (ref 98–111)
Creatinine, Ser: 1.05 mg/dL — ABNORMAL HIGH (ref 0.44–1.00)
GFR, Estimated: >60 mL/min (ref >60)
Glucose, Bld: 202 mg/dL — ABNORMAL HIGH (ref 70–99)
Potassium: 3.3 mmol/L — ABNORMAL LOW (ref 3.5–5.1)
Sodium: 142 mmol/L (ref 135–145)

## 2023-04-10 LAB — GLUCOSE, CAPILLARY
Glucose-Capillary: 218 mg/dL — ABNORMAL HIGH (ref 70–99)
Glucose-Capillary: 172 mg/dL — ABNORMAL HIGH (ref 70–99)
Glucose-Capillary: 202 mg/dL — ABNORMAL HIGH (ref 70–99)
Glucose-Capillary: 150 mg/dL — ABNORMAL HIGH (ref 70–99)
Glucose-Capillary: 146 mg/dL — ABNORMAL HIGH (ref 70–99)
Glucose-Capillary: 142 mg/dL — ABNORMAL HIGH (ref 70–99)
Glucose-Capillary: 114 mg/dL — ABNORMAL HIGH (ref 70–99)

## 2023-04-10 LAB — BLOOD GAS, VENOUS
Acid-Base Excess: 13 mmol/L — ABNORMAL HIGH (ref 0.0–2.0)
Bicarbonate: 38.2 mmol/L — ABNORMAL HIGH (ref 20.0–28.0)
O2 Saturation: 75.4 %
Patient temperature: 37
pCO2, Ven: 49 mm[Hg] (ref 44–60)
pH, Ven: 7.5 — ABNORMAL HIGH (ref 7.25–7.43)
pO2, Ven: 40 mm[Hg] (ref 32–45)

## 2023-04-10 LAB — COOXEMETRY PANEL
Carboxyhemoglobin: 2.2 % — ABNORMAL HIGH (ref 0.5–1.5)
Methemoglobin: 0.7 % (ref 0.0–1.5)
O2 Saturation: 76.8 %
Total hemoglobin: 9.7 g/dL — ABNORMAL LOW (ref 12.0–16.0)

## 2023-04-10 LAB — CBC
HCT: 29.2 % — ABNORMAL LOW (ref 36.0–46.0)
Hemoglobin: 9.3 g/dL — ABNORMAL LOW (ref 12.0–15.0)
MCH: 25.9 pg — ABNORMAL LOW (ref 26.0–34.0)
MCHC: 31.8 g/dL (ref 30.0–36.0)
MCV: 81.3 fL (ref 80.0–100.0)
Platelets: 197 10*3/uL (ref 150–400)
RBC: 3.59 MIL/uL — ABNORMAL LOW (ref 3.87–5.11)
RDW: 14.3 % (ref 11.5–15.5)
WBC: 8.9 10*3/uL (ref 4.0–10.5)
nRBC: 0.7 % — ABNORMAL HIGH (ref 0.0–0.2)

## 2023-04-10 LAB — MAGNESIUM: Magnesium: 2.2 mg/dL (ref 1.7–2.4)

## 2023-04-10 MED ORDER — ROCURONIUM BROMIDE 10 MG/ML (PF) SYRINGE
100.0000 mg | PREFILLED_SYRINGE | Freq: Once | INTRAVENOUS | Status: AC
  Administered 2023-04-11: 100 mg via INTRAVENOUS
  Filled 2023-04-10: qty 10

## 2023-04-10 MED ORDER — MIDAZOLAM HCL 2 MG/2ML IJ SOLN: 5.0000 mg | Freq: Once | INTRAMUSCULAR | Status: DC

## 2023-04-10 MED ORDER — FENTANYL CITRATE PF 50 MCG/ML IJ SOSY
25.0000 ug | PREFILLED_SYRINGE | INTRAMUSCULAR | Status: DC | PRN
  Administered 2023-04-10 (×2): 25 ug via INTRAVENOUS

## 2023-04-10 MED ORDER — AMIODARONE HCL 200 MG PO TABS
400.0000 mg | ORAL_TABLET | Freq: Two times a day (BID) | ORAL | Status: DC
  Administered 2023-04-10 – 2023-04-13 (×6): 400 mg
  Filled 2023-04-10 (×6): qty 2

## 2023-04-10 MED ORDER — FUROSEMIDE 10 MG/ML IJ SOLN
80.0000 mg | Freq: Two times a day (BID) | INTRAMUSCULAR | Status: AC
  Administered 2023-04-10 – 2023-04-11 (×2): 80 mg via INTRAVENOUS
  Filled 2023-04-10 (×2): qty 8

## 2023-04-10 MED ORDER — LIDOCAINE-EPINEPHRINE 1 %-1:100000 IJ SOLN
20.0000 mL | Freq: Once | INTRAMUSCULAR | Status: DC
  Filled 2023-04-10: qty 1

## 2023-04-10 MED ORDER — VITAL 1.5 CAL PO LIQD
1000.0000 mL | ORAL | Status: DC
  Administered 2023-04-10: 1000 mL
  Administered 2023-04-11: 40 mL
  Administered 2023-04-12 – 2023-04-18 (×6): 1000 mL
  Filled 2023-04-10 (×4): qty 1000

## 2023-04-10 MED ORDER — INSULIN ASPART 100 UNIT/ML IJ SOLN: 2.0000 [IU] | INTRAMUSCULAR | Status: DC

## 2023-04-10 MED ORDER — LIDOCAINE-EPINEPHRINE 1 %-1:100000 IJ SOLN: 20.0000 mL | Freq: Once | INTRAMUSCULAR | Status: DC

## 2023-04-10 MED ORDER — INSULIN ASPART 100 UNIT/ML IJ SOLN
4.0000 [IU] | INTRAMUSCULAR | Status: DC
  Administered 2023-04-10 (×4): 4 [IU] via SUBCUTANEOUS

## 2023-04-10 MED ORDER — FENTANYL CITRATE PF 50 MCG/ML IJ SOSY
25.0000 ug | PREFILLED_SYRINGE | INTRAMUSCULAR | Status: DC | PRN
  Administered 2023-04-10: 50 ug via INTRAVENOUS
  Administered 2023-04-10: 100 ug via INTRAVENOUS
  Administered 2023-04-10: 50 ug via INTRAVENOUS
  Filled 2023-04-10 (×2): qty 2

## 2023-04-10 MED ORDER — ETOMIDATE 2 MG/ML IV SOLN
20.0000 mg | Freq: Once | INTRAVENOUS | Status: AC
  Administered 2023-04-11: 20 mg via INTRAVENOUS
  Filled 2023-04-10: qty 10

## 2023-04-10 MED ORDER — ENOXAPARIN SODIUM 40 MG/0.4ML IJ SOSY
40.0000 mg | PREFILLED_SYRINGE | INTRAMUSCULAR | Status: DC
  Administered 2023-04-12 – 2023-04-18 (×6): 40 mg via SUBCUTANEOUS
  Filled 2023-04-10 (×6): qty 0.4

## 2023-04-10 MED ORDER — FENTANYL CITRATE PF 50 MCG/ML IJ SOSY
200.0000 ug | PREFILLED_SYRINGE | Freq: Once | INTRAMUSCULAR | Status: AC
  Administered 2023-04-11: 200 ug via INTRAVENOUS

## 2023-04-10 MED ORDER — FENTANYL 2500MCG IN NS 250ML (10MCG/ML) PREMIX INFUSION
0.0000 ug/h | INTRAVENOUS | Status: DC
  Administered 2023-04-10: 25 ug/h via INTRAVENOUS

## 2023-04-10 MED ORDER — POTASSIUM CHLORIDE 20 MEQ PO PACK
40.0000 meq | PACK | Freq: Once | ORAL | Status: AC
  Administered 2023-04-10: 40 meq
  Filled 2023-04-10: qty 2

## 2023-04-10 MED ORDER — MIDAZOLAM HCL 2 MG/2ML IJ SOLN
5.0000 mg | Freq: Once | INTRAMUSCULAR | Status: AC
  Administered 2023-04-11: 5 mg via INTRAVENOUS
  Filled 2023-04-10: qty 6

## 2023-04-10 MED ORDER — ROCURONIUM BROMIDE 10 MG/ML (PF) SYRINGE: 100.0000 mg | PREFILLED_SYRINGE | Freq: Once | INTRAVENOUS | Status: DC

## 2023-04-10 MED ORDER — ETOMIDATE 2 MG/ML IV SOLN: 20.0000 mg | Freq: Once | INTRAVENOUS | Status: DC

## 2023-04-10 MED ORDER — POTASSIUM CHLORIDE 20 MEQ PO PACK
40.0000 meq | PACK | ORAL | Status: AC
  Administered 2023-04-10 (×2): 40 meq
  Filled 2023-04-10 (×2): qty 2

## 2023-04-10 MED ORDER — FENTANYL CITRATE PF 50 MCG/ML IJ SOSY: 200.0000 ug | PREFILLED_SYRINGE | Freq: Once | INTRAMUSCULAR | Status: DC

## 2023-04-10 NOTE — Progress Notes (Signed)
 NAME:  Vickie Little, MRN:  [id number], DOB:  Sep 14, 1965, LOS: 8 ADMISSION DATE:  04/02/2023, CONSULTATION DATE:  04/02/2023 REFERRING MD:  Denton Lank - EDP, CHIEF COMPLAINT: Cardiac arrest    History of Present Illness:  58 year old female patient with history of metabolic syndrome, and coronary artery disease followed by cardiology last seen in January 2025.  Was at church singing in choir, had a witnessed collapse.  EMS was called on fire arrival the patient was agonal with weak pulses. On EMS arrival the patient was pulseless CPR was initiated, initial rhythm VF, ACLS interventions included epinephrine x 2, defibrillation x 5, amiodarone 300 mg.  An IO was placed in the right humerus she was intubated return of spontaneous circulation estimated at 15 minutes On arrival to [facility] initial EKG negative for ST elevation white blood cell count 11.7 glucose 408 post CPR lactic acid 10 Cardiology consulted by EDP Critical care asked to admit.  Pertinent Medical History:  Coronary artery disease, with prior angina seen by cardiology last in January 2025 type 2 diabetes with retinopathy, last A1c of record 9.5, hypertension, hyperlipidemia Remote herniated disc Obesity  Significant Hospital Events: Including procedures, antibiotic start and stop dates in addition to other pertinent events   2/9 Admitted status post VF arrest time to return of spontaneous circulation estimated at 15 minutes hemodynamically stable on arrival to the ER decorticate posturing 2/10: con't TTM, started on cleviprex  2/11: no change in mental status, needs off sedation for neuro eval 2/12: tachypnea/dysynchrony overnight on vent requiring prop back on at 15. On ttm overnight. Still on cleviprex  2/13: increased vent requirements overnight. Had mucous plug lavaged out. MRI equivocal for anoxic injury. Broadening abx, starting milrinone, lasix, steroids, intermittent paralytic.  2/14: coming down on vent settings,  remains on sedation 2/16: off pressors, neuro consulted. Milrinone d/c   Interim History / Subjective:  Unresponsive, maybe blinking to pain (not convinced this isn't coincidence).  Objective:  Blood pressure 98/61, pulse 63, temperature 99 F (37.2 C), temperature source Bladder, resp. rate (!) 30, height 5\' 7"  (1.702 m), weight 80.2 kg, last menstrual period 03/25/2016, SpO2 98%. CVP:  [8 mmHg-15 mmHg] 10 mmHg  Vent Mode: PRVC FiO2 (%):  [40 %-50 %] 40 % Set Rate:  [30 bmp] 30 bmp Vt Set:  [360 mL] 360 mL PEEP:  [5 cmH20-8 cmH20] 5 cmH20 Pressure Support:  [3 cmH20-5 cmH20] 5 cmH20 Plateau Pressure:  [15 cmH20-18 cmH20] 16 cmH20   Intake/Output Summary (Last 24 hours) at 04/10/2023 0832 Last data filed at 04/10/2023 0800 Gross per 24 hour  Intake 2249.76 ml  Output 1880 ml  Net 369.76 ml   Filed Weights   04/08/23 0500 04/09/23 0312 04/10/23 0311  Weight: 85.3 kg 84.5 kg 80.2 kg   Physical Examination: General: sedated, critically ill appearing  HEENT: ET tube present, pinpoint pupils Neuro: cough w/ noxious stim. Very minimal flicker of response to painful stim on RUE otherwise on response on other exts, intermittently blinks to pain (maybe, not convinced this isn't coincidence).  Lungs: clear bilateral Heart: RRR, NS Abdomen: soft, bowel sounds present Extremities: no edema Skin: warm, dry   Resolved Hospital Problem List:  Lactic acidosis Status post cardiac arrest Shock Status post V-fib cardiac arrest Aspiration PNA Cardiogenic shock Hypernatremia  Assessment & Plan:    Acute HFrEF and HFpEF w/ Demand cardiac ischemia versus acute NSTEMI. S/p VF arrest in patient w/ Known coronary artery disease  Echo 2/10 with EF 40-45%,  global hypokinesis, mild LVH, Coox improved to 76.8% this am. Milrinone off. Advanced heart failure team is following. Appears euvolemic  Plan: Continue amiodarone  Continue aspirin, statin and Zetia Monitor intake and output Advanced  heart failure team is following Patient will require ischemic workup, depending upon neurological recovery  Acute metabolic encephalopathy status postcardiac arrest concerning for anoxic brain injury CT Head 2/9 NAICA No significant improvement in mental status when off sedation. MRI 2/13 showing signs of anoxic brain injury, repeat MRI 2/16 shows resolved restricted diffusion  She continues to have brainstem reflexes but mental status seems disproportionally worse than MRI findings Plan Goals of care discussion Dc sedating meds (try just PRN fent for pain)  Avoid fever  Keep euglycemic  Additional recs per neuro   Acute respiratory failure with hypoxia related to v-fib arrest,  COVID-19 pneumonia, and Aspiration pneumonitis Cxr had been improving as of 2/14 Mental status remaining barrier to extubation. Respiratory culture pos for normal resp flora. [place] is off with temps about 99, no leukocytosis  Plan Cont full vent support w/ daily assessment for SBT VAP prevention bundle in place AM CXR Day 9 abx (currently unasyn); will stop  Minimize sedation  Pending GOC discussion prob best option is trach tomorrow    Diabetes type II with hyperglycemia Blood sugars poorly controlled Plan Continue Semglee at 22units bid Cont resistant ssi Will add TF coverage CBG goal 140-180  Hx Hypertension  BP somewhat labile. Was on cleviprex , d/c 2/15. Now normo to hypotensive  Plan Hydralazine PRN  Acute kidney injury due to ischemic ATN from cardiac arrest Stable this AM with creatinine 1.05. baseline 0.7 Plan Monitor intake and output Renal dose meds  Fluid and electrolyte imbalance: Hypokalemia 3.3 this AM. Got lasix on 2/16 Plan Cont schedule potassium Repeat chem in am   Obesity Diet and exercise counseling as appropriate   Best Practice (right click and "Reselect all SmartList Selections" daily)   Diet/type: Tube feeds DVT prophylaxis LMWH Pressure ulcer(s):  N/A GI prophylaxis: H2B Lines: Central line, Arterial Line, and yes and it is still needed Foley:  Yes, and it is still needed Code Status:  full code Last date of multidisciplinary goals of care discussion: updated husband at bedside this AM   My cct 34 min

## 2023-04-10 NOTE — IPAL (Signed)
  Interdisciplinary Goals of Care Family Meeting   Date carried out:: 04/10/2023  Location of the meeting: Conference room  Member's involved: Nurse Practitioner and Family Member or next of kin  Durable Power of Attorney or acting medical decision maker: Vickie Little (husband)  Discussion: We discussed goals of care for Vickie Little .  We spoke about initial cardiac arrest, Hospital course, MRI findings and currently lack of improvement in neurological exam. We explained that current testing has been prognostically inconclusive however we remain concerned that an neurological insult has occurred. The family desires "every chance" and are in agreement with proceeding with tracheostomy. I did tell them that the tracheostomy would give [place] more time but it still is unclear if she will wake up. The husband verbalized his understanding of this several times but also wanting on-going support and time   Code status: Full Code  Disposition: Continue current acute care   Time spent for the meeting: 14   Vickie Little 04/10/2023, 3:36 PM

## 2023-04-10 NOTE — Progress Notes (Signed)
 Nutrition Follow-up  DOCUMENTATION CODES:   Obesity unspecified  INTERVENTION:  Advance tube feeding via OGT: -Vital 1.5 at 20 ml/h and increase by 69mL/hr Q12 until goal rate of 47ml/hr achieved  -Prosource TF20 60 ml daily - FWF Q2 hours   Provides 2060 kcal, 109 gm protein, 2208 ml (TF + FWF) free water daily   NUTRITION DIAGNOSIS:  Inadequate oral intake related to inability to eat as evidenced by NPO status. - remains applicable  GOAL:  Patient will meet greater than or equal to 90% of their needs - progressing, will be met with tube feed running at goal rate  MONITOR:  TF tolerance, Vent status, Labs  REASON FOR ASSESSMENT:  Consult Enteral/tube feeding initiation and management  ASSESSMENT:   Pt is a 57y.o female with cardiac arrest. PMH: T2DM, CAD, HTN, HLD, obesity. Required total of 15 minutes of CPR.  2/9 admitted/intubated/OGT placed 2/10 echo performed 2/11 TFs initiated 2/12 significant emesis, fecal management system placed 2/13 TFs held; multiple bag lavages 2/14 trickles re-initiated 2/17 advance TFs   Patient is currently intubated on ventilator support MV: 8.7 L/min Temp (24hrs), Avg:97.7 F (36.5 C), Min:95 F (35 C), Max:99.3 F (37.4 C)  Respiratory status improving. Norepinephrine ordered yesterday r/t hypotension and MAP of 56. Off milrinone to decrease hypotension and tachycardia. Pressors now stopped. GOC meeting pending.  Remains on trickles. Spoke with rounding provider, who is amicable to slowly advancing tube feeds as she tolerated trickles over the weekend.   Admit Weight: 88.5kg Current Weight: 80.2kg  Some noted variation in weight trends. stool output and 1.8L UOP yesterday. No significant edema documented. Lasix dose received 2/16.   Intake/Output Summary (Last 24 hours) at 04/10/2023 1016 Last data filed at 04/10/2023 1000 Gross per 24 hour  Intake 1979.82 ml  Output 1900 ml  Net 79.82 ml    Net IO Since  Admission: 181.28 mL [04/10/23 1016]   Aggressive electrolyte repletion continues, as potassium trending low. BUN trending up, whith Crt trending down. Lactic acid normalizing.  Meds: docusate, famotidine, furosemide, SSI, semglee,metoclopramide, Miralax, KCl  Drips: Fentanyl @75mcg /hr Precedex @ 36mcg/hr Levophed stopped at 2252 on 2/16  Labs: K+ 3.3 (L) Na+ 142 (wdl) CBGs 140-202 over 48 hours BUN 57 (H) Crt 1.05 (H) PHOS 4.7 (H) Lactic Acid 2.4 (H) A1c 9.2 (H)   Diet Order:   Diet Order             Diet NPO time specified  Diet effective now             EDUCATION NEEDS:   Not appropriate for education at this time  Skin:  Skin Assessment: Reviewed RN Assessment  Last BM:  2/16 - type 7 - fecal management system  Height:  Ht Readings from Last 1 Encounters:  04/02/23 5\' 7"  (1.702 m)   Weight:  Wt Readings from Last 1 Encounters:  04/10/23 80.2 kg   Ideal Body Weight:  61.4 kg  BMI:  Body mass index is 27.69 kg/m.  Estimated Nutritional Needs:   Kcal:  2000-2200kcal  Protein:  100-110g  Fluid:  >2L/day  Myrtie Cruise MS, RD, LDN Registered Dietitian Clinical Nutrition RD Inpatient Contact Info in Amion

## 2023-04-10 NOTE — Progress Notes (Signed)
 eLink Physician-Brief Progress Note Patient Name: Vickie Little DOB: 04-11-1965 MRN: [id number]   Date of Service  04/10/2023  HPI/Events of Note  Notified that patient is gagging on precedex gtt currently at 0.65mcg/kg/hr and has been needing frequent fentanyl pushes.  RN is requesting to resume fentanyl gtt.  Pt is scheduled for tracheostomy tube placement in the morning.  Pt with COVID.   BP 144/72, HR 90.  eICU Interventions  Ok to start on low dose fentanyl gtt. Continue precedex gtt.      Intervention Category Intermediate Interventions: Other:  Larinda Buttery 04/10/2023, 7:41 PM

## 2023-04-10 NOTE — Progress Notes (Addendum)
 Patient ID: Vickie Little, female   DOB: 1965-08-17, 57 y.o.   MRN: [id number]     Advanced Heart Failure Rounding Note  Cardiologist: Tessa Lerner, DO  HF Cardiologist: Dr. Shirlee Latch  Chief Complaint: OOH VF arrest Subjective:   Remains on Amio.   No purposeful movement. Remains intubated.   Objective:    Weight Range: 80.2 kg Body mass index is 27.69 kg/m.   Vital Signs:   Temp:  [95 F (35 C)-99.3 F (37.4 C)] 99 F (37.2 C) (02/17 0800) Pulse Rate:  [54-107] 69 (02/17 1138) Resp:  [11-35] 31 (02/17 1138) BP: (75-123)/(49-73) 106/66 (02/17 0800) SpO2:  [88 %-100 %] 98 % (02/17 1138) FiO2 (%):  [40 %-50 %] 40 % (02/17 1138) Weight:  [80.2 kg] 80.2 kg (02/17 0311) Last BM Date : 04/09/23  Weight change: Filed Weights   04/08/23 0500 04/09/23 0312 04/10/23 0311  Weight: 85.3 kg 84.5 kg 80.2 kg   Intake/Output:   Intake/Output Summary (Last 24 hours) at 04/10/2023 1144 Last data filed at 04/10/2023 1100 Gross per 24 hour  Intake 2177.6 ml  Output 1960 ml  Net 217.6 ml  CVP 6 Physical Exam  General:  Intubated Cor: PMI nondisplaced. Regular rate & rhythm. No rubs, gallops or murmurs. Lungs: clear Extremities: no cyanosis, clubbing, rash, edema Neuro: Telemetry   NSR 60s  Labs    CBC Recent Labs    04/09/23 0512 04/10/23 0518  WBC 8.9 8.9  NEUTROABS 5.2  --   HGB 9.1* 9.3*  HCT 28.4* 29.2*  MCV 81.4 81.3  PLT 184 197   Basic Metabolic Panel Recent Labs    62/95/28 0512 04/09/23 1149 04/10/23 0518  NA 144  --  142  K 2.7* 3.7 3.3*  CL 100  --  98  CO2 31  --  30  GLUCOSE 140*  --  202*  BUN 53*  --  57*  CREATININE 1.03*  --  1.05*  CALCIUM 9.0  --  9.3  MG  --  2.1 2.2   Liver Function Tests Recent Labs    04/09/23 0512  AST 41  ALT 48*  ALKPHOS 248*  BILITOT 0.5  PROT 5.9*  ALBUMIN 2.2*   No results for input(s): "LIPASE", "AMYLASE" in the last 72 hours. Cardiac Enzymes No results for input(s): "CKTOTAL", "CKMB",  "CKMBINDEX", "TROPONINI" in the last 72 hours.  BNP: BNP (last 3 results) No results for input(s): "BNP" in the last 8760 hours.  ProBNP (last 3 results) No results for input(s): "PROBNP" in the last 8760 hours.  D-Dimer No results for input(s): "DDIMER" in the last 72 hours. Hemoglobin A1C No results for input(s): "HGBA1C" in the last 72 hours.  Fasting Lipid Panel No results for input(s): "CHOL", "HDL", "LDLCALC", "TRIG", "CHOLHDL", "LDLDIRECT" in the last 72 hours.  Thyroid Function Tests No results for input(s): "TSH", "T4TOTAL", "T3FREE", "THYROIDAB" in the last 72 hours.  Invalid input(s): "FREET3"  Other results:  Imaging   MR BRAIN W WO CONTRAST Result Date: 04/09/2023 CLINICAL DATA:  Anoxic brain damage EXAM: MRI HEAD WITHOUT AND WITH CONTRAST TECHNIQUE: Multiplanar, multiecho pulse sequences of the brain and surrounding structures were obtained without and with intravenous contrast. CONTRAST:  9mL GADAVIST GADOBUTROL 1 MMOL/ML IV SOLN COMPARISON:  MRI February 13, 25. FINDINGS: Brain: No acute infarction, hemorrhage, hydrocephalus, extra-axial collection or mass lesion. No pathologic enhancement. Vascular: Major arterial flow voids are maintained at the skull base. Skull and upper cervical spine: Normal marrow signal. Sinuses/Orbits:  Clear.  No acute orbital findings. Other: No mastoid effusions. IMPRESSION: Previously seen subtle restricted diffusion has resolved. No evidence of acute intracranial abnormality. Electronically Signed   By: Feliberto Harts M.D.   On: 04/09/2023 15:57    Medications:    Scheduled Medications:  aspirin  81 mg Per Tube Daily   atorvastatin  80 mg Per Tube Daily   Chlorhexidine Gluconate Cloth  6 each Topical Daily   docusate  100 mg Per Tube BID   enoxaparin (LOVENOX) injection  40 mg Subcutaneous Q24H   ezetimibe  10 mg Per Tube Daily   famotidine  20 mg Per Tube BID   feeding supplement (PROSource TF20)  60 mL Per Tube Daily   free  water  100 mL Per Tube Q2H   insulin aspart  0-20 Units Subcutaneous Q4H   insulin aspart  4 Units Subcutaneous Q4H   insulin glargine-yfgn  22 Units Subcutaneous BID   metoCLOPramide (REGLAN) injection  5 mg Intravenous Q8H   mouth rinse  15 mL Mouth Rinse Q2H   polyethylene glycol  17 g Per Tube Daily    Infusions:  amiodarone 30 mg/hr (04/10/23 1100)   dexmedetomidine (PRECEDEX) IV infusion 0.4 mcg/kg/hr (04/10/23 1144)   feeding supplement (VITAL 1.5 CAL) 20 mL/hr at 04/10/23 1100   norepinephrine (LEVOPHED) Adult infusion Stopped (04/09/23 2252)    PRN Medications: acetaminophen **OR** acetaminophen (TYLENOL) oral liquid 160 mg/5 mL **OR** acetaminophen, dextrose, fentaNYL (SUBLIMAZE) injection, fentaNYL (SUBLIMAZE) injection, hydrALAZINE, ipratropium-albuterol, labetalol, ondansetron (ZOFRAN) IV, mouth rinse  Patient Profile   Vickie Little is a 58 year old with past medical history of NSTEMI in 2013 s/p PCI to LAD, CAD, hypertension, hyperlipidemia, type 2 diabetes, PAD, and neurogenic lumbar claudication, and obesity.   Admitted with OOH VF arrest.  Assessment/Plan   1. VF arrest: Witnessed arrest but was down an unclear amount of time pre-CPR.  When EMS arrived, had 15 minutes CPR + shock x 4 with ROSC.  Cause of arrest uncertain, no chest pain prior but TnI 9509.  No further arrhythmias.  - Targeted temperature management with therapeutic normothermia.  - Continue amiodarone gtt 30/hr 2. CAD: History of CAD with PCI to LAD in 2013.  No chest pain prior to event but HS-TnI elevated 9509, ?demand ischemia with prolonged shock vs ACS. ECG without STEMI, nonspecific changes.  - on Lovenox at DVT dose - atorvastatin 80 daily - ASA per tube -  Continue wait on neuro recovery. If she recovers will need LHC.  3. Cardiogenic shock: Echo with EF 40-45%, normal RV.  Previous echo was in 2013 with normal EF.   - Now off pressors/inotropes. CO-OX 77%.  4. Elevated LFTs: Suspect  shock liver. LFTs trending down.  5. DKA: Poorly controlled DM2, A1c 10.9.  Beta hydroxybutyrate decreased to normal.  - Now on [place] insulin.  6. COVID+: Supportive care.  7. Acute hypoxemic respiratory failure: Suspect aspiration PNA.  Unasyn for aspiration coverage.   - per CCM 8. 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 Brain MRI equivocal for hypoxic ischemic injury.  Has brainstem reflexes, no purposeful response.  -Per primary team.  Neurology following and concerned about recovery. Sedation off.  9. ID: No recurrent fever overnight. PCT trended down to 0.8.  COVID+ but also with suspected aspiration PNA/pneumonitis. Cultures NGTD. WBC 8.9  -  Completed 9 days of Unasyn.   Length of Stay: 4  CRITICAL CARE Performed by: Tonye Becket NP-C  Total critical care time: 15  minutes  Critical care time was exclusive of separately billable procedures and treating other patients.  Critical care was necessary to treat or prevent imminent or life-threatening deterioration.  Critical care was time spent personally by me on the following activities: development of treatment plan with patient and/or surrogate as well as nursing, discussions with consultants, evaluation of patient's response to treatment, examination of patient, obtaining history from patient or surrogate, ordering and performing treatments and interventions, ordering and review of laboratory studies, ordering and review of radiographic studies, pulse oximetry and re-evaluation of patient's condition.  Amy Clegg 04/10/2023 11:44 AM  Agree with above.   Now off pressors. Co-ox 77% Remains intubated/mildly sedated. Chewing on tube and blinking but no purpose movements. Brain MRI with resolution of diffusion process.   Remains in NSR on IV amio   General:  Intubated/sedated. Family at bedside HEENT: normal + ETT c Neck: supple. no JVD. Carotids 2+ bilat; no bruits. No lymphadenopathy or  thryomegaly appreciated. Cor: Regular rate & rhythm. No rubs, gallops or murmurs. Lungs: clear Abdomen: soft, nontender, nondistended. No hepatosplenomegaly. No bruits or masses. Good bowel sounds. Extremities: no cyanosis, clubbing, rash, edema Neuro: chewing on tube. + gag. Blinking frequently. No response to pain   She has stabilized from a cardiac perspective. Rhythm OK on IV amio. Main issue now his her Neuro status. Brain MRI improved but clinical picture remains very concerning.   D/w CCM. Family considering trach.   Neuro following for prognostication.   Can switch to po amio. If recovers will need coronary angio. Start GDMT as tolerated.   CRITICAL CARE Performed by: Arvilla Meres  Total critical care time: 35 minutes  Critical care time was exclusive of separately billable procedures and treating other patients.  Critical care was necessary to treat or prevent imminent or life-threatening deterioration.  Critical care was time spent personally by me (independent of midlevel providers or residents) on the following activities: development of treatment plan with patient and/or surrogate as well as nursing, discussions with consultants, evaluation of patient's response to treatment, examination of patient, obtaining history from patient or surrogate, ordering and performing treatments and interventions, ordering and review of laboratory studies, ordering and review of radiographic studies, pulse oximetry and re-evaluation of patient's condition.  Arvilla Meres, MD  1:44 PM

## 2023-04-10 NOTE — Progress Notes (Signed)
 NEUROLOGY CONSULT FOLLOW UP NOTE   Date of service: April 10, 2023 Patient Name: Vickie Little MRN:  [id number] DOB:  17-Jan-1966  Interval Hx/subjective   Sedation has been stopped. Still unresponsive.  Vitals   Vitals:   04/10/23 1315 04/10/23 1330 04/10/23 1342 04/10/23 1345  BP:      Pulse: 73 77 85 78  Resp: 18 19 17  (!) 22  Temp:      TempSrc:      SpO2: 96% 97% 100% 97%  Weight:      Height:         Body mass index is 27.69 kg/m.   Physical Exam    General: Laying comfortably in bed; in no acute distress.  HENT: Normal oropharynx and mucosa. Normal external appearance of ears and nose.  Neck: Supple, no pain or tenderness  CV: No JVD. No peripheral edema.  Pulmonary: Symmetric Chest rise.  Breathing over vent. Abdomen: Soft to touch, non-tender.  Ext: No cyanosis, edema, or deformity  Skin: No rash. Normal palpation of skin.   Musculoskeletal: Normal digits and nails by inspection. No clubbing.    Neurologic Examination  Mental status/Cognition: No response to voice or loud clap or tactile stimulation.   Speech/language: Mute, no speech, no attempts to communicate.  Does not follow commands. Cranial nerves:   CN II Pupils equal and reactive to light, unable to assess visual field deficit.   CN III,IV,VI EOMI intact to doll's eye.   CN V Corneals intact bilaterally   CN VII Facial diplegia. Facial grimace noted to nares stimulation.   CN VIII Does not turn head towards speech.   CN IX & X Cough is intact.  Unable to elicit a gag.   CN XI Head is midline.  No head movement noted to noxious stimuli.   CN XII Tongue pushed of the floor of the mouth by ETT and hardware.    Sensory/Motor:  Muscle bulk: normal, tone flaccid in all extremities. BLE slightly turned in. Continues to have no response to noxious stimuli in any extremity.      Coordination/Complex Motor:  Unable to assess.  Medications  Current Facility-Administered Medications:     acetaminophen (TYLENOL) tablet 650 mg, 650 mg, Oral, Q4H PRN **OR** acetaminophen (TYLENOL) 160 MG/5ML solution 650 mg, 650 mg, Per Tube, Q4H PRN, 650 mg at 04/06/23 1505 **OR** acetaminophen (TYLENOL) suppository 650 mg, 650 mg, Rectal, Q4H PRN, Simonne Martinet, NP   amiodarone (PACERONE) tablet 400 mg, 400 mg, Oral, BID, Bensimhon, Bevelyn Buckles, MD   aspirin chewable tablet 81 mg, 81 mg, Per Tube, Daily, Simonne Martinet, NP, 81 mg at 04/10/23 4098   atorvastatin (LIPITOR) tablet 80 mg, 80 mg, Per Tube, Daily, Laurey Morale, MD, 80 mg at 04/10/23 1191   Chlorhexidine Gluconate Cloth 2 % PADS 6 each, 6 each, Topical, Daily, Mannam, Praveen, MD, 6 each at 04/10/23 1000   dexmedetomidine (PRECEDEX) 400 MCG/100ML (4 mcg/mL) infusion, 0-1.2 mcg/kg/hr, Intravenous, Titrated, Chand, Garnet Sierras, MD, Stopped at 04/10/23 1300   dextrose 50 % solution 0-50 mL, 0-50 mL, Intravenous, PRN, Simonne Martinet, NP   docusate (COLACE) 50 MG/5ML liquid 100 mg, 100 mg, Per Tube, BID, Cherlynn Polo, Lauren E, PA-C, 100 mg at 04/09/23 2049   enoxaparin (LOVENOX) injection 40 mg, 40 mg, Subcutaneous, Q24H, Andrey Farmer, PA-C, 40 mg at 04/10/23 4782   ezetimibe (ZETIA) tablet 10 mg, 10 mg, Per Tube, Daily, Simonne Martinet, NP, 10 mg at 04/10/23 [phone]  famotidine (PEPCID) tablet 20 mg, 20 mg, Per Tube, BID, Simonne Martinet, NP, 20 mg at 04/10/23 1191   feeding supplement (PROSource TF20) liquid 60 mL, 60 mL, Per Tube, Daily, Cherlynn Polo, Lauren E, PA-C, 60 mL at 04/10/23 0953   feeding supplement (VITAL 1.5 CAL) liquid 1,000 mL, 1,000 mL, Per Tube, Continuous, Steffanie Dunn, DO, Last Rate: 55 mL/hr at 04/10/23 1300, Infusion Verify at 04/10/23 1300   fentaNYL (SUBLIMAZE) injection 25 mcg, 25 mcg, Intravenous, Q15 min PRN, Simonne Martinet, NP, 25 mcg at 04/10/23 1342   fentaNYL (SUBLIMAZE) injection 25-100 mcg, 25-100 mcg, Intravenous, Q30 min PRN, Simonne Martinet, NP   free water 100 mL, 100 mL, Per Tube, Q2H, Karie Fetch P,  DO, 100 mL at 04/10/23 1200   hydrALAZINE (APRESOLINE) injection 10 mg, 10 mg, Intravenous, Q4H PRN, Cheri Fowler, MD, 10 mg at 04/09/23 0945   insulin aspart (novoLOG) injection 0-20 Units, 0-20 Units, Subcutaneous, Q4H, Autry, Lauren E, PA-C, 7 Units at 04/10/23 1220   insulin aspart (novoLOG) injection 4 Units, 4 Units, Subcutaneous, Q4H, Simonne Martinet, NP, 4 Units at 04/10/23 1219   insulin glargine-yfgn (SEMGLEE) injection 22 Units, 22 Units, Subcutaneous, BID, Steffanie Dunn, DO, 22 Units at 04/10/23 0952   ipratropium-albuterol (DUONEB) 0.5-2.5 (3) MG/3ML nebulizer solution 3 mL, 3 mL, Nebulization, Q4H PRN, Karie Fetch P, DO, 3 mL at 04/06/23 0907   labetalol (NORMODYNE) injection 10 mg, 10 mg, Intravenous, Q6H PRN, Cheri Fowler, MD   metoCLOPramide (REGLAN) injection 5 mg, 5 mg, Intravenous, Q8H, Chand, Sudham, MD, 5 mg at 04/10/23 1333   norepinephrine (LEVOPHED) 4mg  in (0.016 mg/mL) premix infusion, 0-40 mcg/min, Intravenous, Titrated, [name], MD, Stopped at 04/09/23 2252   ondansetron (ZOFRAN) injection 4 mg, 4 mg, Intravenous, Q6H PRN, Simonne Martinet, NP   Oral care mouth rinse, 15 mL, Mouth Rinse, Q2H, Mannam, Praveen, MD, 15 mL at 04/10/23 1221   Oral care mouth rinse, 15 mL, Mouth Rinse, PRN, Mannam, Praveen, MD   polyethylene glycol (MIRALAX / GLYCOLAX) packet 17 g, 17 g, Per Tube, Daily, Cherlynn Polo, Lauren E, PA-C, 17 g at 04/09/23 0921  Labs and Diagnostic Imaging   CBC:  Recent Labs  Lab 04/09/23 0512 04/10/23 0518  WBC 8.9 8.9  NEUTROABS 5.2  --   HGB 9.1* 9.3*  HCT 28.4* 29.2*  MCV 81.4 81.3  PLT 184 197    Basic Metabolic Panel:  Lab Results  Component Value Date   NA 142 04/10/2023   K 3.3 (L) 04/10/2023   CO2 30 04/10/2023   GLUCOSE 202 (H) 04/10/2023   BUN 57 (H) 04/10/2023   CREATININE 1.05 (H) 04/10/2023   CALCIUM 9.3 04/10/2023   GFRNONAA >60 04/10/2023   GFRAA >60 04/15/2016   Lipid Panel:  Lab Results  Component Value Date    LDLCALC 90 08/04/2014   HgbA1c:  Lab Results  Component Value Date   HGBA1C 9.2 (H) 04/02/2023   Urine Drug Screen:     Component Value Date/Time   LABOPIA NONE DETECTED 04/03/2023 0234   COCAINSCRNUR NONE DETECTED 04/03/2023 0234   LABBENZ POSITIVE (A) 04/03/2023 0234   AMPHETMU NONE DETECTED 04/03/2023 0234   THCU NONE DETECTED 04/03/2023 0234   LABBARB NONE DETECTED 04/03/2023 0234    Alcohol Level No results found for: "ETH" INR  Lab Results  Component Value Date   INR 1.03 04/15/2016   APTT  Lab Results  Component Value Date   APTT 29 04/15/2016  AED levels: No results found for: "PHENYTOIN", "ZONISAMIDE", "LAMOTRIGINE", "LEVETIRACETA"  2/9 CT Head without contrast(Personally reviewed): No acute intracranial process.    MRI Brain(Personally reviewed): 2/16: Previously seen subtle restricted diffusion has resolved. No evidence of acute intracranial abnormality. 2/13: Subtle/equivocal restricted diffusion involving the high parasagittal frontal lobes and potentially the deep gray nuclei. This could represent a hypoxic ischemic injury (given the clinical history) or artifact.    2/9 rEEG:  This EEG demonstrates evidence of a profound cerebral dysfunction as can be seen with severe hypoxic brain injury, medication induced coma, and diffuse structural injury, among other causes. There was no seizure or seizure predisposition recorded on this study. Please note that lack of epileptiform activity on EEG does not preclude the possibility of epilepsy.  Assessment   Vickie Little is a 58 y.o. female with PMH significant for metabolic syndrome, CAD who had a witnessed collapse, became agonal with weak pulses. On EMS arrival, she was pulseless with VF as initial rhythm and CPR was performed for ~15 minutes before ROSC was achieved. She was intubated in the field by EMS.    TTM was started on her arrival. Required cleviprex for BP management, now off. Amiodarone gtt. HF  team is following due to Acute HF and cardiogenic shock.  Patient is also COVID positive. Resp culture also positive for gram-positive cocci. Vent settings are being weaned down. Patient febrile, with improved WBC count.    Initial MRI with ?subtle DWI changes vs artifiact. MRI on 2/16 with no abnormalities. rEEG with no seizures or epileptiform discharges.  Patient has been weaned off of all sedation. On exam today, she has intact brainstem reflexes. She grimaces to noxious stimuli but no other purposeful movement. No movement in her arms or legs to proximal pinch. She spontaneously is biting down on ETT.  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 She is likely to be total care and require a trach and peg tube.  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 She is likely to be total care and require a trach and peg tube.  - Neurology inpatient team will signoff. Please feel free to contact us with any questions or concerns.  ______________________________________________________________________    Pt seen by Neuro NP/APP and later by MD. Note/plan to be edited by MD as needed.    Lynnae January, DNP, AGACNP-BC [facility] Please use AMION for contact information & EPIC for messaging.   NEUROHOSPITALIST ADDENDUM Performed a face to face diagnostic evaluation.   I have reviewed the contents of history and physical exam as documented by PA/ARNP/Resident and agree with above documentation.  I have discussed and formulated the above plan as documented. Edits to the note have been made as needed.  Erick Blinks, MD [facility] [phone]   If 7pm  to 7am, please call on call as listed on AMION.

## 2023-04-11 ENCOUNTER — Inpatient Hospital Stay (HOSPITAL_COMMUNITY): Payer: Managed Care, Other (non HMO)

## 2023-04-11 DIAGNOSIS — I5041 Acute combined systolic (congestive) and diastolic (congestive) heart failure: Secondary | ICD-10-CM | POA: Diagnosis not present

## 2023-04-11 DIAGNOSIS — J9601 Acute respiratory failure with hypoxia: Secondary | ICD-10-CM | POA: Diagnosis not present

## 2023-04-11 DIAGNOSIS — G9341 Metabolic encephalopathy: Secondary | ICD-10-CM | POA: Diagnosis not present

## 2023-04-11 DIAGNOSIS — I469 Cardiac arrest, cause unspecified: Secondary | ICD-10-CM | POA: Diagnosis not present

## 2023-04-11 LAB — CBC
HCT: 28.5 % — ABNORMAL LOW (ref 36.0–46.0)
Hemoglobin: 9.1 g/dL — ABNORMAL LOW (ref 12.0–15.0)
MCH: 25.9 pg — ABNORMAL LOW (ref 26.0–34.0)
MCHC: 31.9 g/dL (ref 30.0–36.0)
MCV: 81.2 fL (ref 80.0–100.0)
Platelets: 226 10*3/uL (ref 150–400)
RBC: 3.51 MIL/uL — ABNORMAL LOW (ref 3.87–5.11)
RDW: 14.5 % (ref 11.5–15.5)
WBC: 11.4 10*3/uL — ABNORMAL HIGH (ref 4.0–10.5)
nRBC: 0.3 % — ABNORMAL HIGH (ref 0.0–0.2)

## 2023-04-11 LAB — BASIC METABOLIC PANEL
Anion gap: 12 (ref 5–15)
BUN: 51 mg/dL — ABNORMAL HIGH (ref 6–20)
CO2: 28 mmol/L (ref 22–32)
Calcium: 9 mg/dL (ref 8.9–10.3)
Chloride: 105 mmol/L (ref 98–111)
Creatinine, Ser: 1.03 mg/dL — ABNORMAL HIGH (ref 0.44–1.00)
GFR, Estimated: >60 mL/min (ref >60)
Glucose, Bld: 88 mg/dL (ref 70–99)
Potassium: 3.8 mmol/L (ref 3.5–5.1)
Sodium: 145 mmol/L (ref 135–145)

## 2023-04-11 LAB — GLUCOSE, CAPILLARY
Glucose-Capillary: 87 mg/dL (ref 70–99)
Glucose-Capillary: 167 mg/dL — ABNORMAL HIGH (ref 70–99)
Glucose-Capillary: 172 mg/dL — ABNORMAL HIGH (ref 70–99)
Glucose-Capillary: 135 mg/dL — ABNORMAL HIGH (ref 70–99)
Glucose-Capillary: 127 mg/dL — ABNORMAL HIGH (ref 70–99)

## 2023-04-11 LAB — COOXEMETRY PANEL
Carboxyhemoglobin: 2.1 % — ABNORMAL HIGH (ref 0.5–1.5)
Methemoglobin: <0.7 % (ref 0.0–1.5)
O2 Saturation: 72.6 %
Total hemoglobin: 9.3 g/dL — ABNORMAL LOW (ref 12.0–16.0)

## 2023-04-11 MED ORDER — FAMOTIDINE IN NACL 20-0.9 MG/50ML-% IV SOLN
20.0000 mg | INTRAVENOUS | Status: DC
  Administered 2023-04-11 – 2023-04-12 (×2): 20 mg via INTRAVENOUS
  Filled 2023-04-11 (×2): qty 50

## 2023-04-11 MED ORDER — CARMEX CLASSIC LIP BALM EX OINT: TOPICAL_OINTMENT | CUTANEOUS | Status: DC | PRN

## 2023-04-11 MED ORDER — NOREPINEPHRINE 16 MG/250ML-% IV SOLN
0.0000 ug/min | INTRAVENOUS | Status: DC
Start: 2023-04-11 — End: 2023-04-12

## 2023-04-11 MED ORDER — FENTANYL CITRATE PF 50 MCG/ML IJ SOSY
25.0000 ug | PREFILLED_SYRINGE | INTRAMUSCULAR | Status: DC | PRN
  Administered 2023-04-17: 25 ug via INTRAVENOUS
  Filled 2023-04-11 (×3): qty 1

## 2023-04-11 MED ORDER — FENTANYL CITRATE PF 50 MCG/ML IJ SOSY
25.0000 ug | PREFILLED_SYRINGE | INTRAMUSCULAR | Status: DC | PRN
  Administered 2023-04-13: 50 ug via INTRAVENOUS
  Administered 2023-04-13: 25 ug via INTRAVENOUS
  Administered 2023-04-13: 50 ug via INTRAVENOUS
  Administered 2023-04-13: 25 ug via INTRAVENOUS
  Administered 2023-04-14: 75 ug via INTRAVENOUS
  Administered 2023-04-14 (×2): 50 ug via INTRAVENOUS
  Administered 2023-04-14: 75 ug via INTRAVENOUS
  Administered 2023-04-14 – 2023-04-18 (×24): 50 ug via INTRAVENOUS
  Filled 2023-04-11 (×6): qty 1
  Filled 2023-04-11: qty 2
  Filled 2023-04-11 (×26): qty 1

## 2023-04-11 NOTE — Progress Notes (Signed)
 Ok not to replace OGT/NGT.  Cortrak being placed 2/18.   Per Anders Simmonds NP

## 2023-04-11 NOTE — Progress Notes (Addendum)
 NAME:  Vickie Little, MRN:  [id number], DOB:  03/27/1965, LOS: 9 ADMISSION DATE:  04/02/2023, CONSULTATION DATE:  04/02/2023 REFERRING MD:  Denton Lank - EDP, CHIEF COMPLAINT: Cardiac arrest    History of Present Illness:  58 year old female patient with history of metabolic syndrome, and coronary artery disease followed by cardiology last seen in January 2025.  Was at church singing in choir, had a witnessed collapse.  EMS was called on fire arrival the patient was agonal with weak pulses. On EMS arrival the patient was pulseless CPR was initiated, initial rhythm VF, ACLS interventions included epinephrine x 2, defibrillation x 5, amiodarone 300 mg.  An IO was placed in the right humerus she was intubated return of spontaneous circulation estimated at 15 minutes On arrival to [facility] initial EKG negative for ST elevation white blood cell count 11.7 glucose 408 post CPR lactic acid 10 Cardiology consulted by EDP Critical care asked to admit.  Pertinent Medical History:  Coronary artery disease, with prior angina seen by cardiology last in January 2025 type 2 diabetes with retinopathy, last A1c of record 9.5, hypertension, hyperlipidemia Remote herniated disc Obesity  Significant Hospital Events: Including procedures, antibiotic start and stop dates in addition to other pertinent events   2/9 Admitted status post VF arrest time to return of spontaneous circulation estimated at 15 minutes hemodynamically stable on arrival to the ER decorticate posturing 2/10: con't TTM, started on cleviprex  2/11: no change in mental status, needs off sedation for neuro eval 2/12: tachypnea/dysynchrony overnight on vent requiring prop back on at 15. On ttm overnight. Still on cleviprex  2/13: increased vent requirements overnight. Had mucous plug lavaged out. MRI equivocal for anoxic injury. Broadening abx, starting milrinone, lasix, steroids, intermittent paralytic.  2/14: coming down on vent settings,  remains on sedation 2/16: off pressors, neuro consulted. Milrinone d/c   Interim History / Subjective:  Gtts are off. Opening eyes spont but no purposeful movement  Objective:  Blood pressure (!) 98/57, pulse 66, temperature 99.1 F (37.3 C), temperature source Bladder, resp. rate (!) 30, height 5\' 7"  (1.702 m), weight 80 kg, last menstrual period 03/25/2016, SpO2 99%. CVP:  [0 mmHg-41 mmHg] 2 mmHg  Vent Mode: PRVC FiO2 (%):  [40 %] 40 % Set Rate:  [30 bmp] 30 bmp Vt Set:  [360 mL] 360 mL PEEP:  [5 cmH20] 5 cmH20 Plateau Pressure:  [16 cmH20-18 cmH20] 18 cmH20   Intake/Output Summary (Last 24 hours) at 04/11/2023 0804 Last data filed at 04/11/2023 0800 Gross per 24 hour  Intake 1399.31 ml  Output 2315 ml  Net -915.69 ml   Filed Weights   04/09/23 0312 04/10/23 0311 04/11/23 0322  Weight: 84.5 kg 80.2 kg 80 kg   Physical Examination: General 58 year old female laying in bed. Eyes opening and closing spont but not to stimulation or request  HENT NCAT orally intubated. The left internal jugular cath dressing is CD&I. Pupils are equal and briskly reactive Pulm scattered rhonchi w/ cough  Portable chest x-ray with Personally reviewed viewed by myself: The film is rotated.  Endotracheal tubes in satisfactory position, ongoing interstitial patchy airspace disease, allowing for rotation and inspiratory timing no significant change since film on the 14th Card RRR  Abd soft Ext warm brisk CR Neuro sedation is off. + intermittent cough. Eyes open spont at times. Flicker of response both legs to painful stim. Not interactive. Not purposeful  Gu clear   Resolved Hospital Problem List:  Lactic acidosis Status  post cardiac arrest Shock Status post V-fib cardiac arrest Aspiration PNA Cardiogenic shock Hypernatremia  Assessment & Plan:    Acute HFrEF and HFpEF w/ Demand cardiac ischemia versus acute NSTEMI. S/p VF arrest in patient w/ Known coronary artery disease  Echo 2/10 with EF  40-45%, global hypokinesis, mild LVH, Coox improved to 76.8% this am. Milrinone off. Advanced heart failure team is following. Appears euvolemic  Plan: Continue amiodarone  Continue aspirin, statin and Zetia Atorvastatin per Cards Monitor intake and output Advanced heart failure team is following Patient will require ischemic workup, depending upon neurological recovery  Acute metabolic encephalopathy status postcardiac arrest concerning for anoxic brain injury MRI has been inconclusive however exam remains concerning.  We had actually gotten sedation pretty much off on 2/17 during the daytime however by evening back on increased Precedex and fentanyl infusion.  Nursing reporting patient gagging on tube Plan We will aggressively decrease sedating medications as soon as the tracheostomy is placed, this has been her primary barrier to progress  Ventilator dependence following cardiac arrest Mental status remains her primary barrier to extubation Plan Proceed with tracheostomy today Discontinue sedating drips following tracheostomy placement RASS goal 0 VAP bundle Daily assessment for aerosol trach collar and weaning   Low grade fever w/ mild leukocytosis.  Unasyn stopped yesterday.  TMax 100.6 Plan Will ck PCT Send resp culture during trach  F/u CXR  Diabetes type II with hyperglycemia Blood sugars poorly controlled, required titration up of basal coverage on the 17th, glucose now borderline hypoglycemia with n.p.o. status on 2/18 pending trach Plan Hold the morning Semglee, can resume at 22 units every 12 once tube feeds resumed Discontinue tube feed coverage and then resume 4 units every 4 once tube feeds resumed Cont resistant ssi CBG goal 140-180  Hx Hypertension  BP somewhat labile. Was on cleviprex , d/c 2/15. Now normo to hypotensive  Plan Hydralazine PRN  Acute kidney injury due to ischemic ATN from cardiac arrest Stable this AM with creatinine 1.05. baseline  0.7 Plan Monitor intake and output Renal dose meds Avoid hypotension  Fluid and electrolyte imbalance: Intermittent Plan Follow-up a.m. chemistry Ensure supplemental potassium replacement with diuresis  Obesity Plan  diet and exercise counseling as appropriate, clearly clinical course will dictate some of this   Best Practice (right click and "Reselect all SmartList Selections" daily)   Diet/type: Tube feeds DVT prophylaxis LMWH Pressure ulcer(s): N/A GI prophylaxis: H2B Lines: Central line, Arterial Line, and yes and it is still needed Foley:  Yes, and it is still needed Code Status:  full code Last date of multidisciplinary goals of care discussion: updated husband at bedside this AM   My cct 32 min

## 2023-04-11 NOTE — Procedures (Signed)
 Percutaneous Tracheostomy Procedure Note   Vickie Little  [id number]  Jun 30, 1965  Date:04/11/23  Time:12:05 PM   Provider Performing:Pete E Tanja Port  Procedure: Percutaneous Tracheostomy with Bronchoscopic Guidance (40981)  Indication(s) Vent weaning  Consent Risks of the procedure as well as the alternatives and risks of each were explained to the patient and/or caregiver.  Consent for the procedure was obtained.  Anesthesia Etomidate, Versed, Fentanyl, Vecuronium   Time Out Verified patient identification, verified procedure, site/side was marked, verified correct patient position, special equipment/implants available, medications/allergies/relevant history reviewed, required imaging and test results available.   Sterile Technique Maximal sterile technique including sterile barrier drape, hand hygiene, sterile gown, sterile gloves, mask, hair covering.    Procedure Description Appropriate anatomy identified by palpation.  Patient's neck prepped and draped in sterile fashion.  1% lidocaine with epinephrine was used to anesthetize skin overlying neck.  1.5cm incision made and blunt dissection performed until tracheal rings could be easily palpated.   Then a size 6 cuffed Shiley tracheostomy was placed under bronchoscopic visualization using usual Seldinger technique and serial dilation.   Bronchoscope confirmed placement above the carina.  Tracheostomy was sutured in place with adhesive pad to protect skin under pressure.    Patient connected to ventilator.   Complications/Tolerance None; patient tolerated the procedure well. Chest X-ray is ordered to confirm no post-procedural complication.   EBL Minimal   Specimen(s) None

## 2023-04-11 NOTE — Plan of Care (Signed)
  Problem: Fluid Volume: Goal: Ability to maintain a balanced intake and output will improve Outcome: Progressing   Problem: Metabolic: Goal: Ability to maintain appropriate glucose levels will improve Outcome: Progressing   Problem: Nutritional: Goal: Maintenance of adequate nutrition will improve Outcome: Progressing Goal: Progress toward achieving an optimal weight will improve Outcome: Progressing   Problem: Skin Integrity: Goal: Risk for impaired skin integrity will decrease Outcome: Progressing   Problem: Tissue Perfusion: Goal: Adequacy of tissue perfusion will improve Outcome: Progressing   Problem: Education: Goal: Ability to manage disease process will improve Outcome: Progressing   Problem: Cardiac: Goal: Ability to achieve and maintain adequate cardiopulmonary perfusion will improve Outcome: Progressing   Problem: Neurologic: Goal: Promote progressive neurologic recovery Outcome: Progressing   Problem: Respiratory: Goal: Will maintain a patent airway Outcome: Progressing Goal: Complications related to the disease process, condition or treatment will be avoided or minimized Outcome: Progressing

## 2023-04-11 NOTE — Procedures (Signed)
 Diagnostic Bronchoscopy  Vickie Little  [id number]  29-Nov-1965  Date:04/11/23  Time:2:25 PM   Provider Performing:Anahi Belmar C Sukhman Kocher   Procedure: Diagnostic Bronchoscopy (78295)  Indication(s) Assist with direct visualization of tracheostomy placement  Consent Risks of the procedure as well as the alternatives and risks of each were explained to the patient and/or caregiver.  Consent for the procedure was obtained.   Anesthesia See separate tracheostomy note   Time Out Verified patient identification, verified procedure, site/side was marked, verified correct patient position, special equipment/implants available, medications/allergies/relevant history reviewed, required imaging and test results available.   Sterile Technique Usual hand hygiene, masks, gowns, and gloves were used   Procedure Description Bronchoscope advanced through endotracheal tube and into airway.  After suctioning out tracheal secretions, bronchoscope used to provide direct visualization of tracheostomy placement.  Balloon entirely within lumen and above carina.  BAL performed in RML with return of slightly cloudy fluid.   Complications/Tolerance None; patient tolerated the procedure well.   EBL None  Specimen(s) None

## 2023-04-11 NOTE — Progress Notes (Addendum)
 Patient ID: Vickie Little, female   DOB: 15-Sep-1965, 58 y.o.   MRN: [id number]      Advanced Heart Failure Rounding Note  Cardiologist: Tessa Lerner, DO  HF Cardiologist: Dr. Shirlee Latch  Chief Complaint: OOH VF arrest  Subjective:    Antibiotics stopped yesterday. Febrile overnight, Tmax 100.6. Remains intubated and sedated. Plan for trach today.  Objective:    Weight Range: 80 kg Body mass index is 27.62 kg/m.   Vital Signs:   Temp:  [99 F (37.2 C)-100.6 F (38.1 C)] 99.1 F (37.3 C) (02/18 0400) Pulse Rate:  [63-101] 66 (02/18 0700) Resp:  [13-32] 30 (02/18 0700) BP: (85-145)/(57-72) 98/57 (02/18 0700) SpO2:  [91 %-100 %] 99 % (02/18 0700) FiO2 (%):  [40 %] 40 % (02/18 0418) Weight:  [80 kg] 80 kg (02/18 0322) Last BM Date : 04/10/23  Weight change: Filed Weights   04/09/23 0312 04/10/23 0311 04/11/23 0322  Weight: 84.5 kg 80.2 kg 80 kg   Intake/Output:   Intake/Output Summary (Last 24 hours) at 04/11/2023 0716 Last data filed at 04/11/2023 0700 Gross per 24 hour  Intake 1523.09 ml  Output 2340 ml  Net -816.91 ml   Physical Exam   CVP 9 General: Intubated/Sedated Cardiac: JVP ~8cm. S1 and S2 present. No murmurs or rub. Extremities: Warm and dry.  No peripheral edema.  Neuro: No purposeful movement. Light sedation Lines/Devices:  ETT, FMS, OGT, R rad aline  Telemetry   NSR in 60s (personally reviewed)  Labs    CBC Recent Labs    04/09/23 0512 04/10/23 0518  WBC 8.9 8.9  NEUTROABS 5.2  --   HGB 9.1* 9.3*  HCT 28.4* 29.2*  MCV 81.4 81.3  PLT 184 197   Basic Metabolic Panel Recent Labs    40/98/11 1149 04/10/23 0518 04/11/23 0327  NA  --  142 145  K 3.7 3.3* 3.8  CL  --  98 105  CO2  --  30 28  GLUCOSE  --  202* 88  BUN  --  57* 51*  CREATININE  --  1.05* 1.03*  CALCIUM  --  9.3 9.0  MG 2.1 2.2  --    Liver Function Tests Recent Labs    04/09/23 0512  AST 41  ALT 48*  ALKPHOS 248*  BILITOT 0.5  PROT 5.9*  ALBUMIN 2.2*    No results for input(s): "LIPASE", "AMYLASE" in the last 72 hours. Cardiac Enzymes No results for input(s): "CKTOTAL", "CKMB", "CKMBINDEX", "TROPONINI" in the last 72 hours.  BNP: BNP (last 3 results) No results for input(s): "BNP" in the last 8760 hours.  ProBNP (last 3 results) No results for input(s): "PROBNP" in the last 8760 hours.  D-Dimer No results for input(s): "DDIMER" in the last 72 hours. Hemoglobin A1C No results for input(s): "HGBA1C" in the last 72 hours.  Fasting Lipid Panel No results for input(s): "CHOL", "HDL", "LDLCALC", "TRIG", "CHOLHDL", "LDLDIRECT" in the last 72 hours.  Thyroid Function Tests No results for input(s): "TSH", "T4TOTAL", "T3FREE", "THYROIDAB" in the last 72 hours.  Invalid input(s): "FREET3"  Other results:  Imaging   No results found.   Medications:    Scheduled Medications:  amiodarone  400 mg Per Tube BID   aspirin  81 mg Per Tube Daily   atorvastatin  80 mg Per Tube Daily   Chlorhexidine Gluconate Cloth  6 each Topical Daily   docusate  100 mg Per Tube BID   [START ON 04/12/2023] enoxaparin (LOVENOX) injection  40 mg Subcutaneous Q24H   etomidate  20 mg Intravenous Once   ezetimibe  10 mg Per Tube Daily   famotidine  20 mg Per Tube BID   feeding supplement (PROSource TF20)  60 mL Per Tube Daily   fentaNYL (SUBLIMAZE) injection  200 mcg Intravenous Once   free water  100 mL Per Tube Q2H   furosemide  80 mg Intravenous BID   insulin aspart  0-20 Units Subcutaneous Q4H   insulin aspart  4 Units Subcutaneous Q4H   insulin glargine-yfgn  22 Units Subcutaneous BID   lidocaine-EPINEPHrine  20 mL Intradermal Once   metoCLOPramide (REGLAN) injection  5 mg Intravenous Q8H   midazolam  5 mg Intravenous Once   mouth rinse  15 mL Mouth Rinse Q2H   polyethylene glycol  17 g Per Tube Daily   rocuronium  100 mg Intravenous Once    Infusions:  dexmedetomidine (PRECEDEX) IV infusion 0.4 mcg/kg/hr (04/11/23 0700)   feeding  supplement (VITAL 1.5 CAL) Stopped (04/11/23 0326)   fentaNYL infusion INTRAVENOUS 25 mcg/hr (04/11/23 0700)   norepinephrine (LEVOPHED) Adult infusion Stopped (04/09/23 2252)    PRN Medications: acetaminophen **OR** acetaminophen (TYLENOL) oral liquid 160 mg/5 mL **OR** acetaminophen, dextrose, fentaNYL (SUBLIMAZE) injection, fentaNYL (SUBLIMAZE) injection, hydrALAZINE, ipratropium-albuterol, labetalol, ondansetron (ZOFRAN) IV, mouth rinse  Patient Profile   Vickie Little is a 58 year old with past medical history of NSTEMI in 2013 s/p PCI to LAD, CAD, hypertension, hyperlipidemia, type 2 diabetes, PAD, and neurogenic lumbar claudication, and obesity.   Admitted with OOH VF arrest.  Assessment/Plan   1. VF arrest: Witnessed arrest but was down an unclear amount of time pre-CPR.  When EMS arrived, had 15 minutes CPR + shock x 4 with ROSC.  Cause of arrest uncertain, no chest pain prior but TnI 9509.  No further arrhythmias.  - continue amio 400 mg bid  2. CAD: History of CAD with PCI to LAD in 2013.  No chest pain prior to event but HS-TnI elevated 9509, ?demand ischemia with prolonged shock vs ACS. ECG without STEMI, nonspecific changes.  - on Lovenox at DVT dose - atorva 80 daily - ASA 81 mg daily - Continue wait on neuro recovery. If she recovers will need LHC.   3. Cardiogenic shock: Echo with EF 40-45%, normal RV.  Previous echo was in 2013 with normal EF.   - Now off pressors/inotropes. Co-ox 73%.  - CVP 9. Continue Lasix 80 mg IV once this morning.   4. Elevated LFTs: Suspect shock liver. LFTs trending down.   5. DKA: Poorly controlled DM2, A1c 10.9.  Beta hydroxybutyrate decreased to normal.  - Now on [place] insulin.   6. COVID+: Supportive care.   7. Acute hypoxemic respiratory failure: Suspect aspiration PNA.  Unasyn completed yesterday for aspiration coverage.   - per CCM  8. 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 Brain MRI  equivocal for hypoxic ischemic injury. Has brainstem reflexes, no purposeful response.  - Per primary team. Neurology following and concerned about recovery. Sedation off.  - Trach planned for today  9. ID: No recurrent fever overnight. PCT trended down to 0.8.  COVID+ but also with suspected aspiration PNA/pneumonitis. Cultures NGTD. Febrile overnight.  - Completed 9 days of Unasyn (2/16) - Repeat CBC today, WBC 8.9.   Length of Stay: 9  CRITICAL CARE Performed by: [place] Lee   Total critical care time: 15 minutes  Critical care time was exclusive of separately billable procedures and treating other  patients.  Critical care was necessary to treat or prevent imminent or life-threatening deterioration.  Critical care was time spent personally by me on the following activities: development of treatment plan with patient and/or surrogate as well as nursing, discussions with consultants, evaluation of patient's response to treatment, examination of patient, obtaining history from patient or surrogate, ordering and performing treatments and interventions, ordering and review of laboratory studies, ordering and review of radiographic studies, pulse oximetry and re-evaluation of patient's condition.  [place] Lee, NP 04/11/2023 7:16 AM  Agree with above   Remains intubated. Off sedation. Not following command. Chewing on ETT. Co-ox 73% CVP 9   General:  On vent. Not responsive. Chewing on ETT HEENT: normal + ETT Neck: supple. no JVD. Carotids 2+ bilat; no bruits. No lymphadenopathy or thryomegaly appreciated. Cor:  Regular rate & rhythm. No rubs, gallops or murmurs. Lungs: clear Abdomen: soft, nontender, nondistended. No hepatosplenomegaly. No bruits or masses. Good bowel sounds. Extremities: no cyanosis, clubbing, rash, edema Neuro: on vent. Unresponsive  Concern for persistent vegetative state. Plan for trach today to watch for Neuro recovery. Co-ox ok. Rhythm stable. Continue amio 400  bid. Give lasix.  Will need cath if recovers   D/w CCM at bedside  CRITICAL CARE Performed by: Arvilla Meres  Total critical care time: 33 minutes  Critical care time was exclusive of separately billable procedures and treating other patients.  Critical care was necessary to treat or prevent imminent or life-threatening deterioration.  Critical care was time spent personally by me (independent of midlevel providers or residents) on the following activities: development of treatment plan with patient and/or surrogate as well as nursing, discussions with consultants, evaluation of patient's response to treatment, examination of patient, obtaining history from patient or surrogate, ordering and performing treatments and interventions, ordering and review of laboratory studies, ordering and review of radiographic studies, pulse oximetry and re-evaluation of patient's condition.  Arvilla Meres, MD  10:27 AM

## 2023-04-11 NOTE — TOC Progression Note (Signed)
 Transition of Care [facility]) - Progression Note    Patient Details  Name: Vickie Little MRN: [id number] Date of Birth: 1965/11/26  [facility]) CM/SW Contact  Elliot Cousin, RN Phone Number: [phone] 04/11/2023, 11:39 AM  Clinical Narrative:     TOC CM spoke to husband and offered choice for LTAC. States he wants [facility]. Contacted [facility] rep, Ciera with new referral. Medicare.gov list with ratings placed on chart.   Expected Discharge Plan: Long Term Acute Care (LTAC) Barriers to Discharge: Continued Medical Work up  Expected Discharge Plan and Services     Post Acute Care Choice: Long Term Acute Care (LTAC) Living arrangements for the past 2 months: Single Family Home                                       Social Determinants of Health (SDOH) Interventions SDOH Screenings   Food Insecurity: Patient Unable To Answer (04/08/2023)  Housing: Patient Unable To Answer (04/08/2023)  Transportation Needs: Patient Unable To Answer (04/08/2023)  Utilities: Patient Unable To Answer (04/08/2023)  Tobacco Use: Medium Risk (04/02/2023)    Readmission Risk Interventions     No data to display

## 2023-04-12 DIAGNOSIS — I469 Cardiac arrest, cause unspecified: Secondary | ICD-10-CM | POA: Diagnosis not present

## 2023-04-12 DIAGNOSIS — R509 Fever, unspecified: Secondary | ICD-10-CM

## 2023-04-12 DIAGNOSIS — J9601 Acute respiratory failure with hypoxia: Secondary | ICD-10-CM | POA: Diagnosis not present

## 2023-04-12 DIAGNOSIS — G931 Anoxic brain damage, not elsewhere classified: Secondary | ICD-10-CM | POA: Diagnosis not present

## 2023-04-12 LAB — CBC
HCT: 32.4 % — ABNORMAL LOW (ref 36.0–46.0)
Hemoglobin: 10.3 g/dL — ABNORMAL LOW (ref 12.0–15.0)
MCH: 26.1 pg (ref 26.0–34.0)
MCHC: 31.8 g/dL (ref 30.0–36.0)
MCV: 82.2 fL (ref 80.0–100.0)
Platelets: 286 10*3/uL (ref 150–400)
RBC: 3.94 MIL/uL (ref 3.87–5.11)
RDW: 14.5 % (ref 11.5–15.5)
WBC: 13.4 10*3/uL — ABNORMAL HIGH (ref 4.0–10.5)
nRBC: 0 % (ref 0.0–0.2)

## 2023-04-12 LAB — COOXEMETRY PANEL
Carboxyhemoglobin: 2.3 % — ABNORMAL HIGH (ref 0.5–1.5)
Methemoglobin: <0.7 % (ref 0.0–1.5)
O2 Saturation: 83 %
Total hemoglobin: 10.5 g/dL — ABNORMAL LOW (ref 12.0–16.0)

## 2023-04-12 LAB — BASIC METABOLIC PANEL
Anion gap: 11 (ref 5–15)
BUN: 30 mg/dL — ABNORMAL HIGH (ref 6–20)
CO2: 26 mmol/L (ref 22–32)
Calcium: 9 mg/dL (ref 8.9–10.3)
Chloride: 106 mmol/L (ref 98–111)
Creatinine, Ser: 1.3 mg/dL — ABNORMAL HIGH (ref 0.44–1.00)
GFR, Estimated: 49 mL/min — ABNORMAL LOW (ref >60)
Glucose, Bld: 152 mg/dL — ABNORMAL HIGH (ref 70–99)
Potassium: 3.7 mmol/L (ref 3.5–5.1)
Sodium: 143 mmol/L (ref 135–145)

## 2023-04-12 LAB — GLUCOSE, CAPILLARY
Glucose-Capillary: 143 mg/dL — ABNORMAL HIGH (ref 70–99)
Glucose-Capillary: 131 mg/dL — ABNORMAL HIGH (ref 70–99)
Glucose-Capillary: 138 mg/dL — ABNORMAL HIGH (ref 70–99)
Glucose-Capillary: 175 mg/dL — ABNORMAL HIGH (ref 70–99)
Glucose-Capillary: 245 mg/dL — ABNORMAL HIGH (ref 70–99)
Glucose-Capillary: 312 mg/dL — ABNORMAL HIGH (ref 70–99)
Glucose-Capillary: 313 mg/dL — ABNORMAL HIGH (ref 70–99)
Glucose-Capillary: 320 mg/dL — ABNORMAL HIGH (ref 70–99)

## 2023-04-12 LAB — PROCALCITONIN: Procalcitonin: 0.31 ng/mL

## 2023-04-12 MED ORDER — INSULIN GLARGINE-YFGN 100 UNIT/ML ~~LOC~~ SOLN
20.0000 [IU] | Freq: Two times a day (BID) | SUBCUTANEOUS | Status: DC
  Administered 2023-04-12 – 2023-04-13 (×2): 20 [IU] via SUBCUTANEOUS
  Filled 2023-04-12 (×3): qty 0.2

## 2023-04-12 MED ORDER — FAMOTIDINE 20 MG PO TABS
20.0000 mg | ORAL_TABLET | Freq: Two times a day (BID) | ORAL | Status: DC
  Administered 2023-04-12 – 2023-04-18 (×12): 20 mg
  Filled 2023-04-12 (×12): qty 1

## 2023-04-12 MED ORDER — FUROSEMIDE 40 MG PO TABS
80.0000 mg | ORAL_TABLET | Freq: Every day | ORAL | Status: DC
  Administered 2023-04-12 – 2023-04-18 (×7): 80 mg via ORAL
  Filled 2023-04-12 (×7): qty 2

## 2023-04-12 MED ORDER — POTASSIUM CHLORIDE 20 MEQ PO PACK
40.0000 meq | PACK | Freq: Once | ORAL | Status: AC
  Administered 2023-04-12: 40 meq
  Filled 2023-04-12: qty 2

## 2023-04-12 MED ORDER — ACETAMINOPHEN 10 MG/ML IV SOLN
1000.0000 mg | Freq: Once | INTRAVENOUS | Status: AC
  Administered 2023-04-12: 1000 mg via INTRAVENOUS
  Filled 2023-04-12: qty 100

## 2023-04-12 MED ORDER — GERHARDT'S BUTT CREAM: TOPICAL_CREAM | CUTANEOUS | Status: DC | PRN

## 2023-04-12 MED ORDER — POTASSIUM CHLORIDE 20 MEQ PO PACK
40.0000 meq | PACK | Freq: Once | ORAL | Status: DC
Start: 2023-04-12 — End: 2023-04-12

## 2023-04-12 NOTE — Progress Notes (Signed)
 SLP Cancellation Note  Patient Details Name: Vickie Little MRN: [id number] DOB: 05-03-1965   Cancelled treatment:       Reason Eval/Treat Not Completed: Patient's level of consciousness;Patient not medically ready Patient with trach on vent, not alert or responsive. SLP will follow for readiness.  Angela Nevin, MA, CCC-SLP Speech Therapy

## 2023-04-12 NOTE — TOC Progression Note (Addendum)
 Transition of Care [facility]) - Progression Note    Patient Details  Name: MANVI GUILLIAMS MRN: [id number] Date of Birth: 09/22/1965  [facility]) CM/SW Contact  Elliot Cousin, RN Phone Number: [phone] 04/12/2023, 10:14 AM  Clinical Narrative:     [facility]. Will follow up with attending.   Expected Discharge Plan: Long Term Acute Care (LTAC) Barriers to Discharge: Continued Medical Work up  Expected Discharge Plan and Services     Post Acute Care Choice: Long Term Acute Care (LTAC) Living arrangements for the past 2 months: Single Family Home   Social Determinants of Health (SDOH) Interventions SDOH Screenings   Food Insecurity: Patient Unable To Answer (04/08/2023)  Housing: Patient Unable To Answer (04/08/2023)  Transportation Needs: Patient Unable To Answer (04/08/2023)  Utilities: Patient Unable To Answer (04/08/2023)  Tobacco Use: Medium Risk (04/02/2023)    Readmission Risk Interventions     No data to display

## 2023-04-12 NOTE — Plan of Care (Signed)
  Problem: Fluid Volume: Goal: Ability to maintain a balanced intake and output will improve Outcome: Progressing   Problem: Metabolic: Goal: Ability to maintain appropriate glucose levels will improve Outcome: Progressing   Problem: Nutritional: Goal: Maintenance of adequate nutrition will improve Outcome: Progressing   Problem: Skin Integrity: Goal: Risk for impaired skin integrity will decrease Outcome: Progressing   Problem: Tissue Perfusion: Goal: Adequacy of tissue perfusion will improve Outcome: Progressing   Problem: Cardiac: Goal: Ability to achieve and maintain adequate cardiopulmonary perfusion will improve Outcome: Progressing   Problem: Skin Integrity: Goal: Risk for impaired skin integrity will be minimized. Outcome: Progressing   Problem: Coping: Goal: Psychosocial and spiritual needs will be supported Outcome: Progressing   Problem: Respiratory: Goal: Will maintain a patent airway Outcome: Progressing Goal: Complications related to the disease process, condition or treatment will be avoided or minimized Outcome: Progressing   Problem: Clinical Measurements: Goal: Ability to maintain clinical measurements within normal limits will improve Outcome: Progressing Goal: Respiratory complications will improve Outcome: Progressing Goal: Cardiovascular complication will be avoided Outcome: Progressing   Problem: Nutrition: Goal: Adequate nutrition will be maintained Outcome: Progressing   Problem: Coping: Goal: Level of anxiety will decrease Outcome: Progressing   Problem: Elimination: Goal: Will not experience complications related to bowel motility Outcome: Progressing   Problem: Pain Managment: Goal: General experience of comfort will improve and/or be controlled Outcome: Progressing   Problem: Safety: Goal: Ability to remain free from injury will improve Outcome: Progressing   Problem: Skin Integrity: Goal: Risk for impaired skin integrity  will decrease Outcome: Progressing   Problem: Cardiac: Goal: Ability to maintain an adequate cardiac output will improve Outcome: Progressing   Problem: Fluid Volume: Goal: Ability to achieve a balanced intake and output will improve Outcome: Progressing   Problem: Metabolic: Goal: Ability to maintain appropriate glucose levels will improve Outcome: Progressing   Problem: Nutritional: Goal: Maintenance of adequate nutrition will improve Outcome: Progressing   Problem: Respiratory: Goal: Will regain and/or maintain adequate ventilation Outcome: Progressing   Problem: Urinary Elimination: Goal: Ability to achieve and maintain adequate renal perfusion and functioning will improve Outcome: Progressing   Problem: Respiratory: Goal: Patent airway maintenance will improve Outcome: Progressing   Problem: Education: Goal: Ability to describe self-care measures that may prevent or decrease complications (Diabetes Survival Skills Education) will improve Outcome: Not Progressing   Problem: Neurologic: Goal: Promote progressive neurologic recovery Outcome: Not Progressing   Problem: Clinical Measurements: Goal: Will remain free from infection Outcome: Not Progressing   Problem: Activity: Goal: Risk for activity intolerance will decrease Outcome: Not Progressing   Problem: Education: Goal: Knowledge about tracheostomy care/management will improve Outcome: Not Progressing   Problem: Activity: Goal: Ability to tolerate increased activity will improve Outcome: Not Progressing   Problem: Role Relationship: Goal: Ability to communicate will improve Outcome: Not Progressing

## 2023-04-12 NOTE — Progress Notes (Signed)
 Nutrition Follow-up  DOCUMENTATION CODES:  Obesity unspecified  INTERVENTION:  Re-initiate tube feeding via Cortrak: -Vital 1.5 at 55 ml/h ( per day) -Prosource TF20 60 ml daily - FWF Q2 hours   Provides 2060 kcal, 109 gm protein, 2208 ml (TF + FWF) free water daily   NUTRITION DIAGNOSIS:  Inadequate oral intake related to inability to eat as evidenced by NPO status. - remains applicable  GOAL:  Patient will meet greater than or equal to 90% of their needs - being met w/ tube feed regimen  MONITOR:  TF tolerance, Vent status, Labs  REASON FOR ASSESSMENT:  Consult Enteral/tube feeding initiation and management  ASSESSMENT:  Pt is a 57y.o female with cardiac arrest. PMH: T2DM, CAD, HTN, HLD, obesity. Required total of 15 minutes of CPR.  2/9 admitted/intubated/OGT placed 2/10 echo performed 2/11 TFs initiated 2/12 significant emesis, fecal management system placed 2/13 TFs held; multiple bag lavages 2/14 trickles re-initiated 2/17 advance TFs  2/18 trach placed, OGT removed 2/19 Cortrak placed/ feeds re-initiated  Patient with trach placed at bedside yesterday. OGT removed at that time. Cortrak placed today. Noted to be tolerating at goal rate <24 hours ago.   Husband at bedside asking how long she will have to be on artificial nutrition. Discussed reasoning for this therapy and timeframe.   Patient is currently intubated on ventilator support MV: 10.8 L/min Temp (24hrs), Avg:99.9 F (37.7 C), Min:98.4 F (36.9 C), Max:100.8 F (38.2 C)  Propofol: 0 ml/hr   Pt now off sedation. Unresponsive. Pt scheduled to D/C Thursday or Friday to [facility].   Admit Weight: 88.5kg Current Weight: 81.1kg  Stool output of yesterday and 70mL day prior. UOP 3L yesterday. Abdomen soft and tender. No distention noted. No significant edema observed.   Intake/Output Summary (Last 24 hours) at 04/12/2023 1430 Last data filed at 04/12/2023 1400 Gross per 24 hour   Intake 379.83 ml  Output 2115 ml  Net -1735.17 ml     Net IO Since Admission: -4,302.46 mL [04/12/23 1430]   Meds: docusate, furosemide, SSI, famotidine, Miralax, KCl, metoclopramide   Labs:  Na+ 143 (wdl) K+ 3.7 (wdl) WBC 11.4>13.4 (H) CBGs 88-152 over 48 hours  A1c 9.2 (03/2023)  Diet Order:   Diet Order             Diet NPO time specified  Diet effective midnight            EDUCATION NEEDS:  Not appropriate for education at this time  Skin:  Skin Assessment: Reviewed RN Assessment  Last BM:  2/16 - type 7 - fecal management system  Height:  Ht Readings from Last 1 Encounters:  04/02/23 5\' 7"  (1.702 m)   Weight:  Wt Readings from Last 1 Encounters:  04/12/23 81.1 kg   Ideal Body Weight:  61.4 kg  BMI:  Body mass index is 28 kg/m.  Estimated Nutritional Needs:   Kcal:  2000-2200kcal  Protein:  100-110g  Fluid:  >2L/day  Myrtie Cruise MS, RD, LDN Registered Dietitian Clinical Nutrition RD Inpatient Contact Info in Amion

## 2023-04-12 NOTE — Progress Notes (Signed)
 NAME:  Vickie Little, MRN:  [id number], DOB:  [birth year], LOS: 10 ADMISSION DATE:  04/02/2023, CONSULTATION DATE:  04/02/2023 REFERRING MD:  Denton Lank - EDP, CHIEF COMPLAINT: Cardiac arrest    History of Present Illness:  58 year old female patient with history of metabolic syndrome, and coronary artery disease followed by cardiology last seen in January 2025.  Was at church singing in choir, had a witnessed collapse.  EMS was called on fire arrival the patient was agonal with weak pulses. On EMS arrival the patient was pulseless CPR was initiated, initial rhythm VF, ACLS interventions included epinephrine x 2, defibrillation x 5, amiodarone 300 mg.  An IO was placed in the right humerus she was intubated return of spontaneous circulation estimated at 15 minutes On arrival to [facility] initial EKG negative for ST elevation white blood cell count 11.7 glucose 408 post CPR lactic acid 10 Cardiology consulted by EDP Critical care asked to admit.  Pertinent Medical History:  Coronary artery disease, with prior angina seen by cardiology last in January 2025 type 2 diabetes with retinopathy, last A1c of record 9.5, hypertension, hyperlipidemia Remote herniated disc Obesity  Significant Hospital Events: Including procedures, antibiotic start and stop dates in addition to other pertinent events   2/9 Admitted status post VF arrest time to return of spontaneous circulation estimated at 15 minutes hemodynamically stable on arrival to the ER decorticate posturing 2/10: con't TTM, started on cleviprex  2/11: no change in mental status, needs off sedation for neuro eval 2/12: tachypnea/dysynchrony overnight on vent requiring prop back on at 15. On ttm overnight. Still on cleviprex  2/13: increased vent requirements overnight. Had mucous plug lavaged out. MRI equivocal for anoxic injury. Broadening abx, starting milrinone, lasix, steroids, intermittent paralytic.  2/14: coming down on vent settings,  remains on sedation 2/16: off pressors, neuro consulted. Milrinone d/c  2/18 perc tracheostomy  Interim History / Subjective:  Overnight no acute events.  Tmax 100.8.  Objective:  Blood pressure 136/74, pulse 96, temperature 99.7 F (37.6 C), temperature source Bladder, resp. rate (!) 6, height 5\' 7"  (1.702 m), weight 81.1 kg, last menstrual period 03/25/2016, SpO2 100%. CVP:  [9 mmHg-53 mmHg] 12 mmHg  Vent Mode: PRVC FiO2 (%):  [40 %] 40 % Set Rate:  [30 bmp] 30 bmp Vt Set:  [360 mL] 360 mL PEEP:  [5 cmH20] 5 cmH20 Plateau Pressure:  [13 cmH20-18 cmH20] 17 cmH20   Intake/Output Summary (Last 24 hours) at 04/12/2023 1008 Last data filed at 04/12/2023 0800 Gross per 24 hour  Intake 179.83 ml  Output 2160 ml  Net -1980.17 ml   Filed Weights   04/10/23 0311 04/11/23 0322 04/12/23 0232  Weight: 80.2 kg 80 kg 81.1 kg   Physical Examination: General : Critically ill-appearing woman lying in bed no acute distress, on no sedation HENT Buckman/AT, eyes anicteric, core track in place Neck: Minimal bleeding around tracheostomy site, no erythema Pulm no rhonchi, synchronous with mechanical ventilation Card S1-S2, regular rate and rhythm Abd obese, soft, nontender Ext warm, dry, no significant edema Neuro not responsive to verbal or painful stimulation.  Not withdrawing from pain.  Pupils reactive. Derm: Skin warm, dry, no diffuse rashes  Coox 83% BUN 30 Cr 1.3 WBC 13.4 H/H 10.3/32.4 Platelets 286 BAL RLL: rare PMNs>    Resolved Hospital Problem List:  Lactic acidosis Status post cardiac arrest Shock Status post V-fib cardiac arrest Aspiration PNA Cardiogenic shock Hypernatremia  Assessment & Plan:   VF cardiac arrest Acute HFrEF  H/o CAD - con't supportive care -treat fevers - Ischemic workup deferred for now given lack of cardiac recovery - Continue aspirin, statin, Zetia - Avoid uncontrolled hypertension. - Can initiate GDMT as blood pressure can tolerate -Lasix 80 mg  daily maintenance, monitor electrolytes -No longer needing serial cooximeter tree.  Discontinue central line with adequate peripheral IV access.   Acute encephalopathy, anoxic brain injury - Avoid sedation, fentanyl and oxycodone as needed for pain control. -Tracheostomy on 2/18 to facilitate prolonged time for waking up - [facility] is [facility] [employer], authorization approved.  Awaiting a bed. -We deliriogenic medications   Acute respiratory failure with hypoxia COVID infection Previous aspiration pneumonitis versus pneumonia, completed course of Unasyn Post-bronchoscopy fever overnight Previous ARDS due to aspiration pneumonitis resolved - LTVV -VAP prevention protocol - PAD protocol for sedation -Tracheostomy care per protocol.  Sutures should be discontinued on Tues 04/18/23. -Repeat BAL pending Monitor off antibiotics.   High risk for malnutrition - Core track in place, continue tube feeds.  Planning on discharging to LTAC with core track.  If she fails to make neurologic recovery there, would consider palliative care versus PEG tube before transfer to SNF.  Hyperglycemia -SSI -Restart Semglee, 20 units twice daily, can increase to 22 units once tube feeds up to goal - Goal blood glucose 140 and 180  H/o HTN -not currently requiring antihypertensives; has hydralazine PRN  AKI, resolved -strict I/O -monitor  Obesity -Long term weight loss recommended  No family at bedside today. Hopeful for d/c to [facility] in the next few days.  Best Practice (right click and "Reselect all SmartList Selections" daily)   Diet/type: Tube feeds DVT prophylaxis LMWH Pressure ulcer(s): N/A GI prophylaxis: H2B Lines: No longer needed.  Order written to d/c  Foley:  removal ordered  Code Status:  full code Last date of multidisciplinary goals of care discussion: updated husband at bedside this AM   This patient is critically ill with multiple organ system failure which requires  frequent high complexity decision making, assessment, support, evaluation, and titration of therapies. This was completed through the application of advanced monitoring technologies and extensive interpretation of multiple databases. During this encounter critical care time was devoted to patient care services described in this note for 40 minutes.  Steffanie Dunn, DO 04/12/23 4:52 PM [facility]  For contact information, see Amion. If no response to pager, please call PCCM consult pager. After hours, 7PM- 7AM, please call Elink.

## 2023-04-12 NOTE — Progress Notes (Addendum)
 Patient ID: OTELIA HETTINGER, female   DOB: Nov 19, 1965, 58 y.o.   MRN: [id number]      Advanced Heart Failure Rounding Note  Cardiologist: Tessa Lerner, DO  HF Cardiologist: Dr. Shirlee Latch  Chief Complaint: Anoxic Brain Injury  Subjective:    Trached on ventilator. CVP 9-10. Tmax 100.8 and WBC 8.9>13.4 now off Unasyn.  Objective:    Weight Range: 81.1 kg Body mass index is 28 kg/m.   Vital Signs:   Temp:  [99.7 F (37.6 C)-100.8 F (38.2 C)] 99.7 F (37.6 C) (02/19 0331) Pulse Rate:  [68-109] 97 (02/19 0830) Resp:  [0-37] 0 (02/19 0830) BP: (91-149)/(55-81) 138/74 (02/19 0800) SpO2:  [94 %-100 %] 100 % (02/19 0830) FiO2 (%):  [40 %] 40 % (02/19 0800) Weight:  [81.1 kg] 81.1 kg (02/19 0232) Last BM Date : 04/12/23  Weight change: Filed Weights   04/10/23 0311 04/11/23 0322 04/12/23 0232  Weight: 80.2 kg 80 kg 81.1 kg   Intake/Output:   Intake/Output Summary (Last 24 hours) at 04/12/2023 0845 Last data filed at 04/12/2023 0800 Gross per 24 hour  Intake 180.38 ml  Output 2800 ml  Net -2619.62 ml   Physical Exam   CVP 9-10 General: Trached on vent Cardiac: JVP ~8cm. S1 and S2 present. No murmurs or rub. Extremities: Warm and dry. No peripheral edema.  Neuro: Not responsive to pain.  Lines/Devices:  Trach, FMS, OGT, R rad aline  Telemetry   SR in 90s (personally reviewed)  Labs    CBC Recent Labs    04/11/23 0758 04/12/23 0530  WBC 11.4* 13.4*  HGB 9.1* 10.3*  HCT 28.5* 32.4*  MCV 81.2 82.2  PLT 226 286   Basic Metabolic Panel Recent Labs    40/98/11 1149 04/09/23 1149 04/10/23 0518 04/11/23 0327 04/12/23 0619  NA  --    < > 142 145 143  K 3.7  --  3.3* 3.8 3.7  CL  --    < > 98 105 106  CO2  --    < > 30 28 26   GLUCOSE  --    < > 202* 88 152*  BUN  --    < > 57* 51* 30*  CREATININE  --    < > 1.05* 1.03* 1.30*  CALCIUM  --    < > 9.3 9.0 9.0  MG 2.1  --  2.2  --   --    < > = values in this interval not displayed.   Liver Function  Tests No results for input(s): "AST", "ALT", "ALKPHOS", "BILITOT", "PROT", "ALBUMIN" in the last 72 hours.  No results for input(s): "LIPASE", "AMYLASE" in the last 72 hours. Cardiac Enzymes No results for input(s): "CKTOTAL", "CKMB", "CKMBINDEX", "TROPONINI" in the last 72 hours.  BNP: BNP (last 3 results) No results for input(s): "BNP" in the last 8760 hours.  ProBNP (last 3 results) No results for input(s): "PROBNP" in the last 8760 hours.  D-Dimer No results for input(s): "DDIMER" in the last 72 hours. Hemoglobin A1C No results for input(s): "HGBA1C" in the last 72 hours.  Fasting Lipid Panel No results for input(s): "CHOL", "HDL", "LDLCALC", "TRIG", "CHOLHDL", "LDLDIRECT" in the last 72 hours.  Thyroid Function Tests No results for input(s): "TSH", "T4TOTAL", "T3FREE", "THYROIDAB" in the last 72 hours.  Invalid input(s): "FREET3"  Other results:  Imaging   DG Chest Port 1 View Result Date: 04/11/2023 CLINICAL DATA:  Status post tracheostomy placement.  Ex-smoker. EXAM: PORTABLE CHEST 1 VIEW COMPARISON:  04/11/2023 FINDINGS: Interval tracheostomy tube in satisfactory position. Poor inspiration, worse than earlier today. Mild increase in patchy bilateral airspace opacity, accentuated by the decreased inspiration. Thoracic spine degenerative changes. Left jugular catheter tip in the superior vena cava. The previous nasogastric tube has been removed. IMPRESSION: 1. Interval tracheostomy tube in satisfactory position. 2. Mild increase in patchy bilateral airspace opacity, accentuated by the decreased inspiration. This is suspicious for bilateral multifocal infection. Electronically Signed   By: Beckie Salts M.D.   On: 04/11/2023 15:20     Medications:    Scheduled Medications:  amiodarone  400 mg Per Tube BID   aspirin  81 mg Per Tube Daily   atorvastatin  80 mg Per Tube Daily   Chlorhexidine Gluconate Cloth  6 each Topical Daily   docusate  100 mg Per Tube BID    enoxaparin (LOVENOX) injection  40 mg Subcutaneous Q24H   ezetimibe  10 mg Per Tube Daily   feeding supplement (PROSource TF20)  60 mL Per Tube Daily   free water  100 mL Per Tube Q2H   insulin aspart  0-20 Units Subcutaneous Q4H   lidocaine-EPINEPHrine  20 mL Intradermal Once   metoCLOPramide (REGLAN) injection  5 mg Intravenous Q8H   mouth rinse  15 mL Mouth Rinse Q2H   polyethylene glycol  17 g Per Tube Daily   potassium chloride  40 mEq Per Tube Once    Infusions:  famotidine (PEPCID) IV Stopped (04/11/23 1450)   feeding supplement (VITAL 1.5 CAL) Stopped (04/11/23 0326)   norepinephrine (LEVOPHED) Adult infusion      PRN Medications: acetaminophen **OR** acetaminophen (TYLENOL) oral liquid 160 mg/5 mL **OR** acetaminophen, dextrose, fentaNYL (SUBLIMAZE) injection, fentaNYL (SUBLIMAZE) injection, hydrALAZINE, ipratropium-albuterol, labetalol, lip balm, ondansetron (ZOFRAN) IV, mouth rinse  Patient Profile   Ceil Roderick is a 58 year old with past medical history of NSTEMI in 2013 s/p PCI to LAD, CAD, hypertension, hyperlipidemia, type 2 diabetes, PAD, and neurogenic lumbar claudication, and obesity.   Admitted with OOH VF arrest.  Assessment/Plan   1. VF arrest: Witnessed arrest but was down an unclear amount of time pre-CPR.  When EMS arrived, had 15 minutes CPR + shock x 4 with ROSC.  Cause of arrest uncertain, no chest pain prior but TnI 9509.  No further arrhythmias.  - continue amio 400 mg bid  2. CAD: History of CAD with PCI to LAD in 2013.  No chest pain prior to event but HS-TnI elevated 9509, ?demand ischemia with prolonged shock vs ACS. ECG without STEMI, nonspecific changes.  - on Lovenox at DVT dose - atorva 80 daily + ASA 81 mg daily - Continue wait on neuro recovery. If she recovers will need LHC.   3. Cardiogenic shock: Echo with EF 40-45%, normal RV.  Previous echo was in 2013 with normal EF.   - Now off pressors/inotropes. Co-ox 73%.  - CVP 9-10. Start  Lasix 80 mg PO daily.  4. Elevated LFTs: Suspect shock liver. LFTs trending down.   5. DKA: Resolved - SSI  6. COVID+: Supportive care.   7. Acute hypoxemic respiratory failure: Suspect aspiration PNA.  Unasyn completed 2/17 for aspiration coverage.   - per CCM  8. 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 Brain MRI equivocal for hypoxic ischemic injury. Has brainstem reflexes, no purposeful response. No response to pain - Per primary team. Neurology following and concerned about recovery. Sedation off.  - Trach placed 2/18.  9. ID: No recurrent fever overnight. PCT  trended down to 0.8.  COVID+ but also with suspected aspiration PNA/pneumonitis. Cultures NGTD. Febrile Tmax 100.8 - Completed 9 days of Unasyn (2/16) - WBC 8.9>13.4  Length of Stay: 10  CRITICAL CARE Performed by: [place] Lee, NP  Total critical care time: 15 minutes  Critical care time was exclusive of separately billable procedures and treating other patients.  Critical care was necessary to treat or prevent imminent or life-threatening deterioration.  Critical care was time spent personally by me on the following activities: development of treatment plan with patient and/or surrogate as well as nursing, discussions with consultants, evaluation of patient's response to treatment, examination of patient, obtaining history from patient or surrogate, ordering and performing treatments and interventions, ordering and review of laboratory studies, ordering and review of radiographic studies, pulse oximetry and re-evaluation of patient's condition.  [place] Lee, NP 04/12/2023 8:45 AM   Agree with above.   Underwent trach yesterday. Uneventful. Remains on vent through trach. Unresponsive. Neurology concerned about profound anoxic brain injury (? Persistent vegetative state)  Rhythm stable  Co-ox ok. CVP 9-10  General:  Ill appearing on vent through trach  HEENT: normal + spontaneous  constant chewing motions Neck: supple.+ trach Cor: PMI nondisplaced. Regular rate & rhythm. No rubs, gallops or murmurs. Lungs: clear Abdomen: obese soft, nontender, nondistended. No hepatosplenomegaly. No bruits or masses. Good bowel sounds. Extremities: no cyanosis, clubbing, rash, edema Neuro:unresponsive  Stable from cardiac perspective off inotropes. Rhythm stable on po amio.   Will give one dose lasix for volume overload.   Concern for persistent vegetative state.   D/w CCM. We have little left to offer at this point. We will sign off.   Please call if significant neuro recovery as will need further cardiac w/u in that setting.   CRITICAL CARE Performed by: Arvilla Meres  Total critical care time: 38 minutes  Critical care time was exclusive of separately billable procedures and treating other patients.  Critical care was time spent personally by me (independent of midlevel providers or residents) on the following activities: development of treatment plan with patient and/or surrogate as well as nursing, discussions with consultants, evaluation of patient's response to treatment, examination of patient, obtaining history from patient or surrogate, ordering and performing treatments and interventions, ordering and review of laboratory studies, ordering and review of radiographic studies, pulse oximetry and re-evaluation of patient's condition.  Arvilla Meres, MD  11:03 AM

## 2023-04-12 NOTE — Procedures (Signed)
 Cortrak  Person Inserting Tube:  Thomas, Aspen T, RD Tube Type:  Cortrak - 43 inches Tube Size:  10 Tube Location:  Right nare Initial Placement:  Stomach Secured by: Bridle Technique Used to Measure Tube Placement:  Marking at nare/corner of mouth Cortrak Secured At:  67 cm   Cortrak Tube Team Note:  Consult received to place a Cortrak feeding tube.   No x-ray is required. RN may begin using tube.   If the tube becomes dislodged please keep the tube and contact the Cortrak team at [email or web address] for replacement.  If after hours and replacement cannot be delayed, place a NG tube and confirm placement with an abdominal x-ray.    Cammy Copa., RD, LDN, CNSC See AMiON for contact information

## 2023-04-12 NOTE — Evaluation (Signed)
 Physical Therapy Evaluation Patient Details Name: Vickie Little MRN: [id number] DOB: 1965/11/19 Today's Date: 04/12/2023  History of Present Illness  58 year old presenting to ED s/p cardiac arrest while singing in church. Pt underwent defibrillation, amiodarone and ACLS with ROSC at 15 minutes. Imaging revealed anoxic brain injury. COVID+ PMH: CAD, HTN, HLD, obesity, back surgery in 2018   Clinical Impression  Pt admitted with above. Pt no longer under sedation however remains non-responsive. Pt with no command follow, withdraw to noxious stimuli, blink to threat, tracking, opening eyes to name, or active/purposeful movement. Pt moved into chair position in bed without change in vitals. Pt did grimace with cervical rotation to L. Recommend LTAC to allow for increased time to "wake up" per family request. Acute PT to follow from a distance as pt unable to participate at this time.        If plan is discharge home, recommend the following:  (unsafe due to non responsive despite no sedation)   Can travel by private vehicle        Equipment Recommendations  (TBD next venue)  Recommendations for Other Services       Functional Status Assessment Patient has had a recent decline in their functional status and/or demonstrates limited ability to make significant improvements in function in a reasonable and predictable amount of time     Precautions / Restrictions Precautions Precautions: Fall Precaution/Restrictions Comments: trach      Mobility  Bed Mobility               General bed mobility comments: pt dependent for rolling, sliding up in bed. PT moved into chair position in bed, no change in vitals. tolerated 10 min and left in upright position, cleared by RN who was present    Transfers                   General transfer comment: unable    Ambulation/Gait               General Gait Details: unable  Stairs            Wheelchair Mobility      Tilt Bed    Modified Rankin (Stroke Patients Only) Modified Rankin (Stroke Patients Only) Pre-Morbid Rankin Score: No significant disability Modified Rankin: Severe disability     Balance Overall balance assessment:  (pt unable to participate in testing due to lethargy)                                           Pertinent Vitals/Pain Pain Assessment Pain Assessment: Faces Faces Pain Scale: No hurt Pain Location: no response to noxious stimuli to trapezius bell rub, bilat UE and LE deep nail bed pressure or stimuli across bottom of foot    Home Living Family/patient expects to be discharged to:: Other (Comment)                   Additional Comments: [facility], to allow pt more time to wake up per family request    Prior Function Prior Level of Function : Independent/Modified Independent             Mobility Comments: no AD, sang in church choir ADLs Comments: indep     Extremity/Trunk Assessment   Upper Extremity Assessment Upper Extremity Assessment:  (no active ROM, no purposeful movement, flaccidity during PROM, some stiffness noted at  L  shld compared to R shld)    Lower Extremity Assessment Lower Extremity Assessment:  (no active or purposeful movement, flaccidity during PROM, no withdraw to noxious stimuli, no clonus)    Cervical / Trunk Assessment Cervical / Trunk Assessment: Normal  Communication   Communication Communication: Impaired Factors Affecting Communication: Trach/intubated    Cognition Arousal: Stuporous Behavior During Therapy: Flat affect                           PT - Cognition Comments: pt with no command follow, no response to noxious stimuli with exception of grimacing with PROM to neck rotation to the L, blinks to loud clapping but no blink to threat, no tracking Following commands: Impaired Following commands impaired:  (no command follow)     Cueing       General Comments General comments  (skin integrity, edema, etc.): HR around 90 bpm, BP elevated wtih noxious stimuli but diminishes once stimuli is stopped    Exercises Other Exercises Other Exercises: PROM to bilat UE and LEs   Assessment/Plan    PT Assessment Patient needs continued PT services  PT Problem List Decreased strength;Decreased activity tolerance;Decreased balance;Decreased range of motion;Decreased mobility;Decreased cognition;Decreased coordination;Cardiopulmonary status limiting activity       PT Treatment Interventions DME instruction;Gait training;Stair training;Functional mobility training;Therapeutic activities;Therapeutic exercise;Balance training;Neuromuscular re-education    PT Goals (Current goals can be found in the Care Plan section)  Acute Rehab PT Goals Patient Stated Goal: unable to state PT Goal Formulation: Patient unable to participate in goal setting Time For Goal Achievement: 04/26/23 Potential to Achieve Goals: Fair Additional Goals Additional Goal #1: Pt to follow 1 step commands > 50% to participate in AAROM x 4 extremities.    Frequency Min 1X/week     Co-evaluation               AM-PAC PT "6 Clicks" Mobility  Outcome Measure Help needed turning from your back to your side while in a flat bed without using bedrails?: Total Help needed moving from lying on your back to sitting on the side of a flat bed without using bedrails?: Total Help needed moving to and from a bed to a chair (including a wheelchair)?: Total Help needed standing up from a chair using your arms (e.g., wheelchair or bedside chair)?: Total Help needed to walk in hospital room?: Total Help needed climbing 3-5 steps with a railing? : Total 6 Click Score: 6    End of Session Equipment Utilized During Treatment: Oxygen Activity Tolerance: Patient limited by fatigue Patient left: in bed;with call bell/phone within reach;with nursing/sitter in room (in chair position) Nurse Communication: Mobility  status PT Visit Diagnosis: Difficulty in walking, not elsewhere classified (R26.2)    Time: 3086-5784 PT Time Calculation (min) (ACUTE ONLY): 19 min   Charges:   PT Evaluation $PT Eval Low Complexity: 1 Low   PT General Charges $$ ACUTE PT VISIT: 1 Visit         Lewis Shock, PT, DPT Acute Rehabilitation Services Secure chat preferred Office #: [phone]   Iona Hansen 04/12/2023, 8:49 AM

## 2023-04-12 NOTE — TOC Progression Note (Signed)
 Transition of Care [facility]) - Progression Note    Patient Details  Name: LOISE ESGUERRA MRN: [id number] Date of Birth: 1966/01/13  Transition of [facility]) CM/SW Contact  Elliot Cousin, RN Phone Number: [phone] 04/12/2023, 10:49 AM  Clinical Narrative:     [facility] rep, Ciera states she [facility] has been started. Pt scheduled dc Thursday and Friday.   Expected Discharge Plan: Long Term Acute Care (LTAC) Barriers to Discharge: Continued Medical Work up  Expected Discharge Plan and Services     Post Acute Care Choice: Long Term Acute Care (LTAC) Living arrangements for the past 2 months: Single Family Home                                       Social Determinants of Health (SDOH) Interventions SDOH Screenings   Food Insecurity: Patient Unable To Answer (04/08/2023)  Housing: Patient Unable To Answer (04/08/2023)  Transportation Needs: Patient Unable To Answer (04/08/2023)  Utilities: Patient Unable To Answer (04/08/2023)  Tobacco Use: Medium Risk (04/02/2023)    Readmission Risk Interventions     No data to display

## 2023-04-13 DIAGNOSIS — R509 Fever, unspecified: Secondary | ICD-10-CM | POA: Diagnosis not present

## 2023-04-13 DIAGNOSIS — G931 Anoxic brain damage, not elsewhere classified: Secondary | ICD-10-CM | POA: Diagnosis not present

## 2023-04-13 DIAGNOSIS — I469 Cardiac arrest, cause unspecified: Secondary | ICD-10-CM | POA: Diagnosis not present

## 2023-04-13 DIAGNOSIS — J9601 Acute respiratory failure with hypoxia: Secondary | ICD-10-CM | POA: Diagnosis not present

## 2023-04-13 LAB — GLUCOSE, CAPILLARY
Glucose-Capillary: 312 mg/dL — ABNORMAL HIGH (ref 70–99)
Glucose-Capillary: 298 mg/dL — ABNORMAL HIGH (ref 70–99)
Glucose-Capillary: 355 mg/dL — ABNORMAL HIGH (ref 70–99)
Glucose-Capillary: 264 mg/dL — ABNORMAL HIGH (ref 70–99)
Glucose-Capillary: 301 mg/dL — ABNORMAL HIGH (ref 70–99)
Glucose-Capillary: 228 mg/dL — ABNORMAL HIGH (ref 70–99)

## 2023-04-13 LAB — BASIC METABOLIC PANEL
Anion gap: 12 (ref 5–15)
BUN: 28 mg/dL — ABNORMAL HIGH (ref 6–20)
CO2: 28 mmol/L (ref 22–32)
Calcium: 9.4 mg/dL (ref 8.9–10.3)
Chloride: 106 mmol/L (ref 98–111)
Creatinine, Ser: 0.87 mg/dL (ref 0.44–1.00)
GFR, Estimated: >60 mL/min (ref >60)
Glucose, Bld: 318 mg/dL — ABNORMAL HIGH (ref 70–99)
Potassium: 4.1 mmol/L (ref 3.5–5.1)
Sodium: 146 mmol/L — ABNORMAL HIGH (ref 135–145)

## 2023-04-13 LAB — CBC
HCT: 32 % — ABNORMAL LOW (ref 36.0–46.0)
Hemoglobin: 10.1 g/dL — ABNORMAL LOW (ref 12.0–15.0)
MCH: 26.2 pg (ref 26.0–34.0)
MCHC: 31.6 g/dL (ref 30.0–36.0)
MCV: 82.9 fL (ref 80.0–100.0)
Platelets: 353 10*3/uL (ref 150–400)
RBC: 3.86 MIL/uL — ABNORMAL LOW (ref 3.87–5.11)
RDW: 14.6 % (ref 11.5–15.5)
WBC: 17 10*3/uL — ABNORMAL HIGH (ref 4.0–10.5)
nRBC: 0.1 % (ref 0.0–0.2)

## 2023-04-13 LAB — COOXEMETRY PANEL
Carboxyhemoglobin: 2 % — ABNORMAL HIGH (ref 0.5–1.5)
Methemoglobin: <0.7 % (ref 0.0–1.5)
O2 Saturation: 86.4 %
Total hemoglobin: 10.4 g/dL — ABNORMAL LOW (ref 12.0–16.0)

## 2023-04-13 LAB — CULTURE, RESPIRATORY W GRAM STAIN: Culture: NORMAL

## 2023-04-13 LAB — PROCALCITONIN: Procalcitonin: 0.25 ng/mL

## 2023-04-13 LAB — MRSA NEXT GEN BY PCR, NASAL: MRSA by PCR Next Gen: NOT DETECTED

## 2023-04-13 MED ORDER — PIPERACILLIN-TAZOBACTAM 3.375 G IVPB
3.3750 g | Freq: Three times a day (TID) | INTRAVENOUS | Status: DC
  Administered 2023-04-13 – 2023-04-18 (×16): 3.375 g via INTRAVENOUS
  Filled 2023-04-13 (×16): qty 50

## 2023-04-13 MED ORDER — INSULIN ASPART 100 UNIT/ML IJ SOLN
4.0000 [IU] | INTRAMUSCULAR | Status: DC
Start: 2023-04-13 — End: 2023-04-14
  Administered 2023-04-13 – 2023-04-14 (×7): 4 [IU] via SUBCUTANEOUS

## 2023-04-13 MED ORDER — FREE WATER
200.0000 mL | Status: DC
  Administered 2023-04-13 – 2023-04-14 (×10): 200 mL

## 2023-04-13 MED ORDER — OXIDIZED CELLULOSE EX PADS
1.0000 | MEDICATED_PAD | Freq: Once | CUTANEOUS | Status: DC | PRN
  Filled 2023-04-13: qty 1

## 2023-04-13 MED ORDER — INSULIN GLARGINE-YFGN 100 UNIT/ML ~~LOC~~ SOLN
25.0000 [IU] | Freq: Two times a day (BID) | SUBCUTANEOUS | Status: DC
  Administered 2023-04-13 – 2023-04-17 (×9): 25 [IU] via SUBCUTANEOUS
  Filled 2023-04-13 (×9): qty 0.25

## 2023-04-13 MED ORDER — LIDOCAINE-EPINEPHRINE 1 %-1:100000 IJ SOLN
10.0000 mL | Freq: Once | INTRAMUSCULAR | Status: AC
  Administered 2023-04-13: 10 mL via INTRADERMAL
  Filled 2023-04-13: qty 1

## 2023-04-13 NOTE — Inpatient Diabetes Management (Signed)
 Inpatient Diabetes Program Recommendations  AACE/ADA: New Consensus Statement on Inpatient Glycemic Control (2015)  Target Ranges:  Prepandial:   less than 140 mg/dL      Peak postprandial:   less than 180 mg/dL (1-2 hours)      Critically ill patients:  140 - 180 mg/dL   Lab Results  Component Value Date   GLUCAP 298 (H) 04/13/2023   HGBA1C 9.2 (H) 04/02/2023    Review of Glycemic Control  Latest Reference Range & Units 04/12/23 19:53 04/12/23 20:15 04/12/23 23:26 04/13/23 03:59 04/13/23 07:28  Glucose-Capillary 70 - 99 mg/dL 244 (H) 010 (H) 272 (H) 312 (H) 298 (H)  (H): Data is abnormally high  Current orders for Inpatient glycemic control:  Semglee 20 units bid, Novolog 0-20 units q 4 hours Vital 1.5- 55 ml/hr Inpatient Diabetes Program Recommendations:    Please consider adding Novolog 4 units q 4 hours to cover tube feeds.   Thanks,  Lorenza Cambridge, RN, BC-ADM Inpatient Diabetes Coordinator Pager [phone]  (8a-5p)

## 2023-04-13 NOTE — Plan of Care (Signed)
  Problem: Nutritional: Goal: Maintenance of adequate nutrition will improve Outcome: Progressing   Problem: Skin Integrity: Goal: Risk for impaired skin integrity will decrease Outcome: Progressing   Problem: Neurologic: Goal: Promote progressive neurologic recovery Outcome: Progressing   Problem: Clinical Measurements: Goal: Will remain free from infection Outcome: Progressing

## 2023-04-13 NOTE — Progress Notes (Signed)
 PCCM Interval Progress Note:  Called to bedside for bleeding from patient's tracheostomy site. Percutaneous tracheostomy created 2/18 by PCCM team. #6 Shiley placed. Intraprocedural course was uncomplicated and patient tolerated well. Slow oozing of blood was noted 2/19AM and continued despite multiple dressing changes.  On my arrival to bedside, patient had a very slow peristomal ooze and nonadherent clot. I removed this with forceps and Q-tips until the stoma site was more clearly visualized; bleeding appeared slow ("ooze") and venous, no arterial-appearing/pulsatile or large volume blood noted.  After cleaning the trach site, I injected 5mL 1% lidocaine with Epi to attempt hemostasis. Oozing slowed. Surgicel pad added. H&H sent to monitor blood counts (stable 10.1 (10.3)).  F/u H&H, monitor site today 2/20.  Tim Lair, PA-C [facility] 04/13/23 5:38 AM  Please see [email or web address] for pager details.  From 7A-7P if no response, please call [phone] After hours, please call ELink [phone]

## 2023-04-13 NOTE — Progress Notes (Signed)
 NAME:  Vickie Little, MRN:  [id number], DOB:  [birth year], LOS: 11 ADMISSION DATE:  04/02/2023, CONSULTATION DATE:  04/02/2023 REFERRING MD:  Denton Lank - EDP, CHIEF COMPLAINT: Cardiac arrest    History of Present Illness:  58 year old female patient with history of metabolic syndrome, and coronary artery disease followed by cardiology last seen in January 2025.  Was at church singing in choir, had a witnessed collapse.  EMS was called on fire arrival the patient was agonal with weak pulses. On EMS arrival the patient was pulseless CPR was initiated, initial rhythm VF, ACLS interventions included epinephrine x 2, defibrillation x 5, amiodarone 300 mg.  An IO was placed in the right humerus she was intubated return of spontaneous circulation estimated at 15 minutes On arrival to [facility] initial EKG negative for ST elevation white blood cell count 11.7 glucose 408 post CPR lactic acid 10 Cardiology consulted by EDP Critical care asked to admit.  Pertinent Medical History:  Coronary artery disease, with prior angina seen by cardiology last in January 2025 type 2 diabetes with retinopathy, last A1c of record 9.5, hypertension, hyperlipidemia Remote herniated disc Obesity  Significant Hospital Events: Including procedures, antibiotic start and stop dates in addition to other pertinent events   2/9 Admitted status post VF arrest time to return of spontaneous circulation estimated at 15 minutes hemodynamically stable on arrival to the ER decorticate posturing 2/10: con't TTM, started on cleviprex  2/11: no change in mental status, needs off sedation for neuro eval 2/12: tachypnea/dysynchrony overnight on vent requiring prop back on at 15. On ttm overnight. Still on cleviprex  2/13: increased vent requirements overnight. Had mucous plug lavaged out. MRI equivocal for anoxic injury. Broadening abx, starting milrinone, lasix, steroids, intermittent paralytic.  2/14: coming down on vent settings,  remains on sedation 2/16: off pressors, neuro consulted. Milrinone d/c  2/18 perc tracheostomy 2/20 oozing from tracheostomy overnight, required lidocaine with epi injection with resolution of bleeding.  Antibiotics also resumed for Low-grade fever and worsening leukocytosis  Interim History / Subjective:  Bleeding trach overnight, resolved this a.m.  Objective:  Blood pressure 124/69, pulse (!) 108, temperature 99.5 F (37.5 C), temperature source Axillary, resp. rate (!) 26, height 5\' 7"  (1.702 m), weight 81.9 kg, last menstrual period 03/25/2016, SpO2 98%. CVP:  [6 mmHg-34 mmHg] 7 mmHg  Vent Mode: PRVC FiO2 (%):  [40 %] 40 % Set Rate:  [30 bmp] 30 bmp Vt Set:  [360 mL] 360 mL PEEP:  [5 cmH20] 5 cmH20 Plateau Pressure:  [12 cmH20-17 cmH20] 12 cmH20   Intake/Output Summary (Last 24 hours) at 04/13/2023 1045 Last data filed at 04/13/2023 0800 Gross per 24 hour  Intake 986.25 ml  Output 2065 ml  Net -1078.75 ml   Filed Weights   04/11/23 0322 04/12/23 0232 04/13/23 0500  Weight: 80 kg 81.1 kg 81.9 kg   Physical Examination: General: Acute on chronic ill-appearing deconditioned middle-aged female lying in bed in no acute distress HEENT: Trach midline, MM pink/moist, PERRL,  Neuro: Responds to painful stimuli CV: s1s2 regular rate and rhythm, no murmur, rubs, or gallops,  PULM: Rhonchi bilaterally, no increased work of breathing, no added breath sounds GI: soft, bowel sounds active in all 4 quadrants, non-tender, non-distended, tolerating TF Extremities: warm/dry, no edema  Skin: no rashes or lesions  Resolved Hospital Problem List:  Lactic acidosis Status post cardiac arrest Shock Status post V-fib cardiac arrest Aspiration PNA Cardiogenic shock Hypernatremia   Assessment & Plan:   VF  cardiac arrest Acute HFrEF H/o CAD -Ischemic workup deferred for now given lack of cardiac recovery P: Continue supportive care Continue scheduled amiodarone 400 mg daily GDMT as  able Continue scheduled Lasix 80 mg daily Monitor electrolytes   Acute encephalopathy, anoxic brain injury P: Avoid sedation Tracheostomy established 2/18 to facilitate prolonged time for waking up Pending transfer to LTAC   Acute respiratory failure with hypoxia COVID infection Previous aspiration pneumonitis versus pneumonia, completed course of Unasyn Concern for recurrent pneumonia given worsening leukocytosis and low-grade fever 2/20 Post-bronchoscopy fever overnight Previous ARDS due to aspiration pneumonitis resolved P: 7-day course of IV Zosyn started 2/20 Continue ventilator support with lung protective strategies  Wean PEEP and FiO2 for sats greater than 90%. Head of bed elevated 30 degrees. Plateau pressures less than 30 cm H20.  Follow intermittent chest x-ray and ABG.   SAT/SBT as tolerated, mentation preclude extubation  Ensure adequate pulmonary hygiene  Follow cultures  VAP bundle in place  PAD protocol   High risk for malnutrition - Core track in place.  Planning on discharging to LTAC with core track.  If she fails to make neurologic recovery there, would consider palliative care versus PEG tube before transfer to SNF. P: Continue tube feeds via cortrack  Hyperglycemia P: Continue resistant scale SSI Increase long-acting insulin to 25 units twice daily Add tube feed coverage  H/o HTN P: As needed IV hydralazine  AKI, resolved P: Strict intake and output Monitor  Obesity P: Long-term weight loss recommended   Best Practice (right click and "Reselect all SmartList Selections" daily)   Diet/type: Tube feeds DVT prophylaxis LMWH Pressure ulcer(s): N/A GI prophylaxis: H2B Lines: No longer needed.  Order written to d/c  Foley:  removal ordered  Code Status:  full code Last date of multidisciplinary goals of care discussion: updated husband at bedside this AM  CRITICAL CARE Performed by: Jaxin Fulfer D. Harris   Total critical care time: 37  minutes  Critical care time was exclusive of separately billable procedures and treating other patients.  Critical care was necessary to treat or prevent imminent or life-threatening deterioration.  Critical care was time spent personally by me on the following activities: development of treatment plan with patient and/or surrogate as well as nursing, discussions with consultants, evaluation of patient's response to treatment, examination of patient, obtaining history from patient or surrogate, ordering and performing treatments and interventions, ordering and review of laboratory studies, ordering and review of radiographic studies, pulse oximetry and re-evaluation of patient's condition.  Bryana Froemming D. Harris, NP-C [facility] Personal contact information can be found on Amion  If no contact or response made please call [phone] 04/13/2023, 11:58 AM

## 2023-04-13 NOTE — Discharge Summary (Addendum)
 Physician Discharge Summary       Patient ID: Vickie Little MRN: [id number] DOB/AGE: 56/03/1965 56 y.o.  Admission Date: 2023/04/12 Discharge Date: 04/18/2023  Discharge Diagnoses:  Principal Problem:   Cardiac arrest [facility]) Active Problems:   Tracheostomy status (HCC)   COVID-19   Aspiration pneumonia (HCC)   On mechanically assisted ventilation (HCC)   Anoxic brain damage (HCC)   Tracheostomy dependence (HCC)   History of Present Illness: 58 year old female with a past medical history of metabolic syndrome, CAD, NSTEMI, diabetes, hypertension who initially presented to the emergency department on 2023/04/12 after having witnessed cardiac arrest. She was singing in church choir when she collapsed. EMS initiated CPR on arrival with initial rhythm of ventricular fibrillation. She had estimated 15 minutes of down time prior to ROSC.   Hospital Course:  In the ED, noted to have ST elevation and cardiology was consulted. Critical care team admitted patient and she was started on targeted temperature management (TTM). Additionally, she was diagnosed with COVID-19 infection. She was started on heparin, amiodarone, and cardiology work up was initiated. Were unable to complete ischemic work up due to her acuity and needing ongoing evaluation of neurological recovery. Her hospital course was complicated by DKA which was resolved on insulin drip. She had acute mucous plugging during her stay requiring repeated saline lavage, increased vent requirements and escalation of antibiotics, steroids, and push dose rocuronium which improved her respiratory status. Additionally, she had cardiogenic shock due to her cardiac arrest requiring milrinone which was discontinued after resolution of shock. She had MRI which was equivocal for anoxic brain injury and neurology was consulted to assist in neuro prognostication. After sedation was weaned off, patient did not wake up. She grimaces to noxious stimulus but has  no other purposeful movement. 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 There was an [employer] and family desired "every chance" for patient recovery. Therefore, tracheostomy was completed. She has been able to wean somewhat from the ventilator to ATC during the day while continuing to use the ventilator at night. She has begun to open her eyes and will intermittently track, but no purposeful interaction otherwise. Case management [facility].   Discharge Plan by Active Problems:   VF cardiac arrest Acute HFrEF H/o CAD -GDMT as BP can tolerate -aspirin, statin -con't amidoarone BID -if she makes a significant neurological recovery, would need to follow up OP to determine if she needs an ischemic evaluation   Acute respiratory failure with hypoxia: multifactorial over the course of the admission from CHF, PNA Tracheostomy dependent Aspiration pneumonia - Continue trach collar trials during the day.  Still nocturnally vent dependent - Continue daily scheduled Lasix - Trach sutures out 2/25 - Trach care per protocol - LTVV - VAP prevention protocol - PAD protocol for sedation - 7 days of zosyn; day 6/7   Anoxic brain injury - Continue limiting sedation is much as possible - Enteral pain control-oxycodone per tube, decreasing dose 2/25 - Supportive care, avoid fevers, avoid major electrolyte swings - Prevent hypotension and hypoxia. - PT, OT   Hyperglycemia -Semglee to 32 units BID -aspart 8 units q4h with TF; hold if TF held -SSI PRN -goal BG 140-180   Anemia -Transfuse for hemoglobin less than 7 or hemodynamically significant bleeding   At risk for malnutrition - Tube feeds via core track  Significant Hospital Tests/Studies:  2023/04/12 CT head: non-acute 2023-04-12  EEG: profound cerebral dysfunction, no epileptiform activity.  2/10 Echo: LVEF 40-45%,  grade 1 DD 2/13 MRI brain: evidence of anoxic injury vs artifact 2/16 MRI brain: Previously seen subtle restricted diffusion has resolved. No evidence of acute intracranial abnormality.   Consults: Cardiology Advanced Heart Failure  Neurology,  Diabetes coordinator  Critical Care   Discharge Exam: BP (!) 121/59   Pulse 97   Temp 98.1 F (36.7 C) (Axillary)   Resp 14   Ht 5\' 7"  (1.702 m)   Wt 82.3 kg   LMP 03/25/2016   SpO2 98%   BMI 28.42 kg/m   Physical Examination: Overweight middle aged female in NAD Sedated RASS -2.  RRR, no MRG Trach collar 28%. No distress and satting well.  Abd soft non-distended. Tol TF well.  MSK: no acute deformity or ROM Limitation. Wrist contractures.  SKin grossly intact.   Labs at Discharge: Lab Results  Component Value Date   CREATININE 0.82 04/18/2023   BUN 17 04/18/2023   NA 133 (L) 04/18/2023   K 4.0 04/18/2023   CL 102 04/18/2023   CO2 25 04/18/2023   Lab Results  Component Value Date   WBC 11.8 (H) 04/17/2023   HGB 8.9 (L) 04/17/2023   HCT 28.0 (L) 04/17/2023   MCV 81.4 04/17/2023   PLT 453 (H) 04/17/2023   Lab Results  Component Value Date   ALT 59 (H) 04/17/2023   AST 41 04/17/2023   ALKPHOS 201 (H) 04/17/2023   BILITOT 0.5 04/17/2023   Lab Results  Component Value Date   INR 1.03 04/15/2016   INR 1.06 02/12/2012    Disposition:  Discharge disposition: 02-[facility]red [facility]      Discharge to [facility] ([facility]) Discharge Instructions     No wound care   Complete by: As directed       Allergies as of 04/18/2023   No Known Allergies      Medication List     STOP taking these medications    cetirizine 10 MG tablet Commonly known as: ZYRTEC   cholecalciferol 25 MCG (1000 UNIT) tablet Commonly known as: VITAMIN D3   cilostazol 50 MG tablet Commonly known as: PLETAL   clotrimazole-betamethasone cream Commonly known as: LOTRISONE   dapagliflozin propanediol 10 MG  Tabs tablet Commonly known as: FARXIGA   fluticasone 50 MCG/ACT nasal spray Commonly known as: FLONASE   glimepiride 2 MG tablet Commonly known as: AMARYL   metFORMIN 500 MG tablet Commonly known as: GLUCOPHAGE   metoprolol succinate 25 MG 24 hr tablet Commonly known as: TOPROL-XL   multivitamin with minerals Tabs tablet   Ozempic (0.25 or 0.5 MG/DOSE) 2 MG/1.5ML Sopn Generic drug: Semaglutide(0.25 or 0.5MG /DOS)   Ozempic (2 MG/DOSE) 8 MG/3ML Sopn Generic drug: Semaglutide (2 MG/DOSE)   Trulicity 4.5 MG/0.5ML Soaj Generic drug: Dulaglutide   TUSSIN CF PO       TAKE these medications    amiodarone 200 MG tablet Commonly known as: PACERONE Place 1 tablet (200 mg total) into feeding tube 2 (two) times daily.   aspirin EC 81 MG tablet Take 81 mg by mouth daily.   atorvastatin 20 MG tablet Commonly known as: LIPITOR TAKE 1 TABLET(20 MG) BY MOUTH DAILY What changed: Another medication with the same name was added. Make sure you understand how and when to take each.   atorvastatin 80 MG tablet Commonly known as: LIPITOR Place 1 tablet (80 mg total) into feeding tube daily. Start taking on: April 19, 2023 What changed: You were already taking a medication with the same name, and this prescription was added. Make sure you understand how and when to take each.   dextrose 50 % solution Inject 0-50 mLs into the vein as needed for low blood sugar.   docusate 50 MG/5ML liquid Commonly known as: COLACE Place 10 mLs (100 mg total) into feeding tube 2 (two) times daily.   enoxaparin 40 MG/0.4ML injection Commonly known as: LOVENOX Inject 0.4 mLs (40 mg total) into the skin daily. Start taking on: April 19, 2023   ezetimibe 10 MG tablet Commonly known as: ZETIA Take 1 tablet (10 mg total) by mouth daily.   famotidine 20 MG tablet Commonly known as: PEPCID Place 1 tablet (20 mg total) into feeding tube 2 (two) times daily.   feeding supplement (PROSource  TF20) liquid Place 60 mLs into feeding tube daily. Start taking on: April 19, 2023   feeding supplement (VITAL 1.5 CAL) Liqd Place 1,000 mLs into feeding tube continuous.   fentaNYL 50 MCG/ML injection Commonly known as: SUBLIMAZE Inject 0.5 mLs (25 mcg total) into the vein every 15 (fifteen) minutes as needed (to achieve RASS & CPOT goal.).   free water Soln Place 300 mLs into feeding tube every 4 (four) hours.   furosemide 80 MG tablet Commonly known as: LASIX Take 1 tablet (80 mg total) by mouth daily. Start taking on: April 19, 2023   hydrALAZINE 20 MG/ML injection Commonly known as: APRESOLINE Inject 0.5 mLs (10 mg total) into the vein every 4 (four) hours as needed.   insulin aspart 100 UNIT/ML injection Commonly known as: novoLOG Inject 0-20 Units into the skin every 4 (four) hours.   insulin aspart 100 UNIT/ML injection Commonly known as: novoLOG Inject 8 Units into the skin every 4 (four) hours.   insulin glargine 100 UNIT/ML injection Commonly known as: LANTUS Inject 0.32 mLs (32 Units total) into the skin 2 (two) times daily.   ipratropium-albuterol 0.5-2.5 (3) MG/3ML Soln Commonly known as: DUONEB Take 3 mLs by nebulization every 4 (four) hours as needed.   labetalol 5 MG/ML injection Commonly known as: NORMODYNE Inject 2 mLs (10 mg total) into the vein every 6 (six) hours as needed (SBP >160).   ondansetron 4 MG/2ML Soln injection Commonly known as: ZOFRAN Inject 2 mLs (4 mg total) into the vein every 6 (six) hours as needed for nausea.   oxyCODONE 5 MG immediate release tablet Commonly known as: Oxy IR/ROXICODONE Place 0.5 tablets (2.5 mg total) into feeding tube every 8 (eight) hours.   piperacillin-tazobactam 3.375 GM/50ML IVPB Commonly known as: ZOSYN Inject 50 mLs (3.375 g total) into the vein every 8 (eight) hours.   polyethylene glycol 17 g packet Commonly known as: MIRALAX / GLYCOLAX Place 17 g into feeding tube daily. Start taking  on: April 19, 2023         Discharged Condition: fair  50 minutes of time have been dedicated to discharge assessment, planning and discharge instructions.   Signed:   Joneen Roach, AGACNP-BC [facility]  See Amion for personal pager PCCM on call pager [phone] until 7pm. Please call Elink 7p-7a. [phone]  04/18/2023 12:25 PM     Attending attestation:  Still required nocturnal ventilation. No acute events overnight.  Less agitated today after starting enteral oxycodone.  BP (!) 97/53   Pulse 91   Temp 98.1 F (36.7 C) (Axillary)   Resp 18   Ht 5\' 7"  (1.702 m)  Wt 82.3 kg   LMP 03/25/2016   SpO2 96%   BMI 28.42 kg/m  Chronically ill-appearing woman lying in bed no acute distress More lethargic today, still opens eyes to stimulation S1-S2, regular rate and rhythm Breathing comfortably on trach collar, scattered rhonchi Abdomen obese, soft, nontender No peripheral edema Skin warm, dry, diffuse rashes  Discharge diagnoses: Ventricular fibrillation cardiac arrest Acute HFrEF History of coronary artery disease Acute respiratory failure with hypoxia Acute pulmonary edema Tracheostomy dependence Aspiration pneumonia Anoxic brain injury Encephalopathy due to brain injury Hyperglycemia History of diabetes Anemia Obesity Hyponatremia Lactic acidosis status post cardiac arrest Shock Status post V-fib cardiac arrest Aspiration PNA w/ ARDS Cardiogenic shock Hypernatremia  AKI COVID-19 infection History of hypertension   Stable for discharge to [facility].  Steffanie Dunn, DO 04/18/23 2:52 PM [facility]  For contact information, see Amion. If no response to pager, please call PCCM consult pager. After hours, 7PM- 7AM, please call Elink.

## 2023-04-14 ENCOUNTER — Ambulatory Visit: Payer: 59 | Admitting: Internal Medicine

## 2023-04-14 ENCOUNTER — Inpatient Hospital Stay (HOSPITAL_COMMUNITY): Payer: Managed Care, Other (non HMO)

## 2023-04-14 DIAGNOSIS — U071 COVID-19: Secondary | ICD-10-CM | POA: Diagnosis not present

## 2023-04-14 DIAGNOSIS — I469 Cardiac arrest, cause unspecified: Secondary | ICD-10-CM | POA: Diagnosis not present

## 2023-04-14 DIAGNOSIS — J690 Pneumonitis due to inhalation of food and vomit: Secondary | ICD-10-CM | POA: Diagnosis not present

## 2023-04-14 DIAGNOSIS — G931 Anoxic brain damage, not elsewhere classified: Secondary | ICD-10-CM | POA: Diagnosis not present

## 2023-04-14 LAB — GLUCOSE, CAPILLARY
Glucose-Capillary: 227 mg/dL — ABNORMAL HIGH (ref 70–99)
Glucose-Capillary: 243 mg/dL — ABNORMAL HIGH (ref 70–99)
Glucose-Capillary: 217 mg/dL — ABNORMAL HIGH (ref 70–99)
Glucose-Capillary: 291 mg/dL — ABNORMAL HIGH (ref 70–99)
Glucose-Capillary: 227 mg/dL — ABNORMAL HIGH (ref 70–99)
Glucose-Capillary: 216 mg/dL — ABNORMAL HIGH (ref 70–99)
Glucose-Capillary: 212 mg/dL — ABNORMAL HIGH (ref 70–99)

## 2023-04-14 LAB — CBC
HCT: 31.7 % — ABNORMAL LOW (ref 36.0–46.0)
Hemoglobin: 10 g/dL — ABNORMAL LOW (ref 12.0–15.0)
MCH: 26.1 pg (ref 26.0–34.0)
MCHC: 31.5 g/dL (ref 30.0–36.0)
MCV: 82.8 fL (ref 80.0–100.0)
Platelets: 280 10*3/uL (ref 150–400)
RBC: 3.83 MIL/uL — ABNORMAL LOW (ref 3.87–5.11)
RDW: 14.6 % (ref 11.5–15.5)
WBC: 17.4 10*3/uL — ABNORMAL HIGH (ref 4.0–10.5)
nRBC: 0 % (ref 0.0–0.2)

## 2023-04-14 LAB — PROCALCITONIN: Procalcitonin: 0.22 ng/mL

## 2023-04-14 LAB — BASIC METABOLIC PANEL
Anion gap: 12 (ref 5–15)
BUN: 24 mg/dL — ABNORMAL HIGH (ref 6–20)
CO2: 28 mmol/L (ref 22–32)
Calcium: 8.8 mg/dL — ABNORMAL LOW (ref 8.9–10.3)
Chloride: 106 mmol/L (ref 98–111)
Creatinine, Ser: 0.75 mg/dL (ref 0.44–1.00)
GFR, Estimated: >60 mL/min (ref >60)
Glucose, Bld: 255 mg/dL — ABNORMAL HIGH (ref 70–99)
Potassium: 3.7 mmol/L (ref 3.5–5.1)
Sodium: 146 mmol/L — ABNORMAL HIGH (ref 135–145)

## 2023-04-14 MED ORDER — AMIODARONE HCL 200 MG PO TABS
200.0000 mg | ORAL_TABLET | Freq: Two times a day (BID) | ORAL | Status: DC
  Administered 2023-04-14 – 2023-04-18 (×9): 200 mg
  Filled 2023-04-14 (×8): qty 1

## 2023-04-14 MED ORDER — FREE WATER
300.0000 mL | Status: DC
  Administered 2023-04-14 – 2023-04-16 (×29): 300 mL

## 2023-04-14 MED ORDER — POTASSIUM CHLORIDE 20 MEQ PO PACK
40.0000 meq | PACK | Freq: Once | ORAL | Status: AC
  Administered 2023-04-14: 40 meq
  Filled 2023-04-14: qty 2

## 2023-04-14 MED ORDER — INSULIN ASPART 100 UNIT/ML IJ SOLN
8.0000 [IU] | INTRAMUSCULAR | Status: DC
  Administered 2023-04-14 – 2023-04-18 (×24): 8 [IU] via SUBCUTANEOUS

## 2023-04-14 MED ORDER — METOCLOPRAMIDE HCL 5 MG/ML IJ SOLN
5.0000 mg | Freq: Two times a day (BID) | INTRAMUSCULAR | Status: DC
  Administered 2023-04-14 – 2023-04-16 (×4): 5 mg via INTRAVENOUS
  Filled 2023-04-14 (×4): qty 2

## 2023-04-14 NOTE — Plan of Care (Signed)
  Problem: Respiratory: Goal: Will maintain a patent airway Outcome: Progressing Goal: Complications related to the disease process, condition or treatment will be avoided or minimized Outcome: Progressing   Problem: Clinical Measurements: Goal: Ability to maintain clinical measurements within normal limits will improve Outcome: Progressing Goal: Respiratory complications will improve Outcome: Progressing   Problem: Nutrition: Goal: Adequate nutrition will be maintained Outcome: Progressing   Problem: Skin Integrity: Goal: Risk for impaired skin integrity will decrease Outcome: Progressing   Problem: Urinary Elimination: Goal: Ability to achieve and maintain adequate renal perfusion and functioning will improve Outcome: Progressing   Problem: Role Relationship: Goal: Ability to communicate will improve Outcome: Not Progressing

## 2023-04-14 NOTE — Progress Notes (Signed)
 NAME:  Vickie Little, MRN:  [id number], DOB:  1965-06-04, LOS: 12 ADMISSION DATE:  04/02/2023, CONSULTATION DATE:  04/02/2023 REFERRING MD:  Denton Lank - EDP, CHIEF COMPLAINT: Cardiac arrest    History of Present Illness:  58 year old female patient with history of metabolic syndrome, and coronary artery disease followed by cardiology last seen in January 2025.  Was at church singing in choir, had a witnessed collapse.  EMS was called on fire arrival the patient was agonal with weak pulses. On EMS arrival the patient was pulseless CPR was initiated, initial rhythm VF, ACLS interventions included epinephrine x 2, defibrillation x 5, amiodarone 300 mg.  An IO was placed in the right humerus she was intubated return of spontaneous circulation estimated at 15 minutes On arrival to [facility] initial EKG negative for ST elevation white blood cell count 11.7 glucose 408 post CPR lactic acid 10 Cardiology consulted by EDP Critical care asked to admit.  Pertinent Medical History:  Coronary artery disease, with prior angina seen by cardiology last in January 2025 type 2 diabetes with retinopathy, last A1c of record 9.5, hypertension, hyperlipidemia Remote herniated disc Obesity  Significant Hospital Events: Including procedures, antibiotic start and stop dates in addition to other pertinent events   2/9 Admitted status post VF arrest time to return of spontaneous circulation estimated at 15 minutes hemodynamically stable on arrival to the ER decorticate posturing 2/10: con't TTM, started on cleviprex  2/11: no change in mental status, needs off sedation for neuro eval 2/12: tachypnea/dysynchrony overnight on vent requiring prop back on at 15. On ttm overnight. Still on cleviprex  2/13: increased vent requirements overnight. Had mucous plug lavaged out. MRI equivocal for anoxic injury. Broadening abx, starting milrinone, lasix, steroids, intermittent paralytic.  2/14: coming down on vent settings,  remains on sedation 2/16: off pressors, neuro consulted. Milrinone d/c  2/18 perc tracheostomy 2/20 oozing from tracheostomy overnight, required lidocaine with epi injection with resolution of bleeding.  Antibiotics also resumed for Low-grade fever and worsening leukocytosis 2/21 started on first trach collar trial, no further bleeding from tracheostomy site  Interim History / Subjective:  No further bleeding from tracheostomy site  Objective:  Blood pressure (!) 156/99, pulse (!) 107, temperature 98.8 F (37.1 C), temperature source Oral, resp. rate (!) 21, height 5\' 7"  (1.702 m), weight 79.1 kg, last menstrual period 03/25/2016, SpO2 99%.    Vent Mode: PRVC FiO2 (%):  [40 %] 40 % Set Rate:  [30 bmp] 30 bmp Vt Set:  [360 mL] 360 mL PEEP:  [5 cmH20] 5 cmH20 Pressure Support:  [5 cmH20] 5 cmH20 Plateau Pressure:  [9 cmH20-14 cmH20] 14 cmH20   Intake/Output Summary (Last 24 hours) at 04/14/2023 0817 Last data filed at 04/14/2023 0600 Gross per 24 hour  Intake 1389.18 ml  Output 700 ml  Net 689.18 ml   Filed Weights   04/12/23 0232 04/13/23 0500 04/14/23 0500  Weight: 81.1 kg 81.9 kg 79.1 kg   Physical Examination: General 58 year old female patient lying in bed currently on aerosol trach collar without accessory use HEENT normocephalic atraumatic she has a size 6 cuffed tracheostomy this is midline, I do not see any bloody drainage today, cuff is currently deflated pupils equal reactive Pulmonary coarse scattered rhonchi no accessory use on aerosol trach collar pulse oximetry 100% Cardiac: Regular rate and rhythm Abdomen: Soft nontender getting tube feeds via core track Extremities are warm and dry Neuro, grimaces to painful stimulus, decerebrate posturing with both upper extremities to noxious  stimulus, kicks her left lower leg GU clear yellow  Resolved Hospital Problem List:  Lactic acidosis Status post cardiac arrest Shock Status post V-fib cardiac arrest Aspiration PNA  w/ ARDS Cardiogenic shock Hypernatremia  AKI  Assessment & Plan:    HFrEF w/ H/o CAD s/p VF arrest -Ischemic workup deferred for now given lack of cardiac recovery plan Continue supportive care Continue scheduled amiodarone 200 mg via tube twice a day GDMT as able Continue scheduled Lasix 80 mg daily Monitor electrolytes   Acute encephalopathy, anoxic brain injury Plan Minimize sedating meds Keep euglycemic Treat fevers Supportive care and time    Trach/vent dependent s/p cardiac arrest -on ATC for first time today  Plan Would rest on full support at [facility] and resume daily ATC as tolerated working towards vent liberation if able  Cont VAP bundle PAD goal 0 PRN CXR Pulse ox Routine trach care Sutures to be removed after 2/25  COVID Plan Cont airborne precautions   Recurrent aspiration PNA NPF in sputum, WBC still elevated but procal re-assuring. MRSA PCR neg Plan  Day 2 of 5-7 zosyn  F/u CXR today   On-going leukocytosis low gd temp  -treating for asp since 20th.  Plan Repeat CXR Ck MRSA PCR  High risk for malnutrition w/ obesity  - Core track in place.  plan Continue tube feeds via cortrack PEG to be considered based on clinical course  RD following. Would cont to follow in LTAC setting  Hypernatremia  Plan Adjust free water Am chem May need to back off on lasix  Hyperglycemia still w/ sub optimal control  plan Continue resistant scale SSI Cont long-acting insulin to 25 units twice daily Inc TF coverage to 8 units q4  H/o HTN plan As needed IV hydralazine   Best Practice (right click and "Reselect all SmartList Selections" daily)   Diet/type: Tube feeds DVT prophylaxis LMWH Pressure ulcer(s): N/A GI prophylaxis: H2B Lines: No longer needed.  Order written to d/c  Foley:  removal ordered  Code Status:  full code Last date of multidisciplinary goals of care discussion: updated husband at bedside this AM  CRITICAL CARE Performed by:  Shelby Mattocks 32 minutes 04/14/2023, 8:17 AM

## 2023-04-14 NOTE — Plan of Care (Signed)
   Problem: Fluid Volume: Goal: Ability to maintain a balanced intake and output will improve Outcome: Progressing   Problem: Metabolic: Goal: Ability to maintain appropriate glucose levels will improve Outcome: Progressing   Problem: Nutritional: Goal: Maintenance of adequate nutrition will improve Outcome: Progressing

## 2023-04-14 NOTE — Plan of Care (Signed)
  Problem: Nutritional: Goal: Maintenance of adequate nutrition will improve Outcome: Progressing   Problem: Skin Integrity: Goal: Risk for impaired skin integrity will decrease Outcome: Progressing   Problem: Cardiac: Goal: Ability to achieve and maintain adequate cardiopulmonary perfusion will improve Outcome: Progressing   Problem: Respiratory: Goal: Will maintain a patent airway Outcome: Progressing   Problem: Metabolic: Goal: Ability to maintain appropriate glucose levels will improve Outcome: Not Progressing

## 2023-04-14 NOTE — TOC Progression Note (Signed)
 Transition of Care [facility]) - Progression Note    Patient Details  Name: Vickie Little MRN: [id number] Date of Birth: 05-19-1965  Transition of Care [facility]) CM/SW Contact  Elliot Cousin, RN Phone Number: [phone] 04/14/2023, 9:18 AM  Clinical Narrative:    [facility], waiting bed availability.    Expected Discharge Plan: Long Term Acute Care (LTAC) Barriers to Discharge: Continued Medical Work up  Expected Discharge Plan and Services     Post Acute Care Choice: Long Term Acute Care (LTAC) Living arrangements for the past 2 months: Single Family Home                                       Social Determinants of Health (SDOH) Interventions SDOH Screenings   Food Insecurity: Patient Unable To Answer (04/08/2023)  Housing: Patient Unable To Answer (04/08/2023)  Transportation Needs: Patient Unable To Answer (04/08/2023)  Utilities: Patient Unable To Answer (04/08/2023)  Tobacco Use: Medium Risk (04/02/2023)    Readmission Risk Interventions     No data to display

## 2023-04-14 NOTE — Progress Notes (Signed)
 SLP Cancellation Note  Patient Details Name: Vickie Little MRN: [id number] DOB: August 04, 1965   Cancelled treatment:       Reason Eval/Treat Not Completed: Patient's level of consciousness. Note that pt has started to trial trach collar today. Touched base with RN who thinks that her mentation remains a barrier. Will continue to follow.    Mahala Menghini., M.A. CCC-SLP Acute Rehabilitation Services Office [phone]  Secure chat preferred  04/14/2023, 11:56 AM

## 2023-04-14 NOTE — Progress Notes (Signed)
 Nutrition Follow-up  DOCUMENTATION CODES:   Obesity unspecified  INTERVENTION:  Continue tube feeding via Cortrak: -Vital 1.5 at 55 ml/h ( per day) -Prosource TF20 60 ml daily - FWF Q2 hours   Provides 2060 kcal, 109 gm protein, 4608 ml (TF + FWF) free water daily   NUTRITION DIAGNOSIS:  Inadequate oral intake related to inability to eat as evidenced by NPO status. - remains applicable  GOAL:  Patient will meet greater than or equal to 90% of their needs - addressing with TF  MONITOR:  TF tolerance, Vent status, Labs  REASON FOR ASSESSMENT:   Consult Enteral/tube feeding initiation and management  ASSESSMENT:   Pt is a 58y.o female with cardiac arrest. PMH: T2DM, CAD, HTN, HLD, obesity. Required total of 15 minutes of CPR.  2/9 admitted/intubated/OGT placed 2/10 echo performed 2/11 TFs initiated 2/12 significant emesis, fecal management system placed 2/13 TFs held; multiple bag lavages 2/14 trickles re-initiated 2/17 advance TFs  2/18 trach placed, OGT removed 2/19 Cortrak placed/ feeds re-initiated 2/20 bleeding from trach site 2/21 trialing trach collar  Pt has been approved for LTAC placement. Awaiting bed availability. Mentation remains a barrier to progress. Off all sedation. Did have some recent bleeding from trach site that has resolved. PEG tube to be considered depending on clinical course.    Patient is currently intubated on ventilator support MV: 11.2 L/min Temp (24hrs), Avg:98.7 F (37.1 C), Min:97.6 F (36.4 C), Max:99.4 F (37.4 C)  TF remains at goal rate and patient tolerating. No emesis. Bowels moving. Showed stool output and UOP yesterday.   Admit Weight: 88.5kg Current Weight: 79.1kg   Intake/Output Summary (Last 24 hours) at 04/14/2023 1256 Last data filed at 04/14/2023 0805 Gross per 24 hour  Intake 1505.18 ml  Output 735 ml  Net 770.18 ml     BUN trending down. Crt within desirable limits. Requiring  potassium repletion. FWF increased r/t hypernatremia, per NP note today.   Meds: docusate, famotidine, furosemide, SSI, novolog, semglee, metoclopramide, Miralax, Kcl, IV ABX   Labs:  Na+ 143>146 (H) K+ 2.7--->3.3--->3.7 (wdl) BUN 24 (H) WBC 13.4>17.0>17.4 (H)  Diet Order:   Diet Order             Diet NPO time specified  Diet effective midnight             EDUCATION NEEDS:   Not appropriate for education at this time  Skin:  Skin Assessment: Reviewed RN Assessment  Last BM:  2/16 - type 7 - fecal management system  Height:  Ht Readings from Last 1 Encounters:  04/02/23 5\' 7"  (1.702 m)   Weight:  Wt Readings from Last 1 Encounters:  04/14/23 79.1 kg    Ideal Body Weight:  61.4 kg  BMI:  Body mass index is 27.31 kg/m.  Estimated Nutritional Needs:   Kcal:  2000-2200kcal  Protein:  100-110g  Fluid:  >2L/day  Myrtie Cruise MS, RD, LDN Registered Dietitian Clinical Nutrition RD Inpatient Contact Info in Amion

## 2023-04-15 DIAGNOSIS — R739 Hyperglycemia, unspecified: Secondary | ICD-10-CM

## 2023-04-15 DIAGNOSIS — I469 Cardiac arrest, cause unspecified: Secondary | ICD-10-CM | POA: Diagnosis not present

## 2023-04-15 DIAGNOSIS — G931 Anoxic brain damage, not elsewhere classified: Secondary | ICD-10-CM | POA: Diagnosis not present

## 2023-04-15 LAB — CBC
HCT: 29.3 % — ABNORMAL LOW (ref 36.0–46.0)
Hemoglobin: 9.2 g/dL — ABNORMAL LOW (ref 12.0–15.0)
MCH: 25.9 pg — ABNORMAL LOW (ref 26.0–34.0)
MCHC: 31.4 g/dL (ref 30.0–36.0)
MCV: 82.5 fL (ref 80.0–100.0)
Platelets: 371 10*3/uL (ref 150–400)
RBC: 3.55 MIL/uL — ABNORMAL LOW (ref 3.87–5.11)
RDW: 14.3 % (ref 11.5–15.5)
WBC: 14.5 10*3/uL — ABNORMAL HIGH (ref 4.0–10.5)
nRBC: 0 % (ref 0.0–0.2)

## 2023-04-15 LAB — PROCALCITONIN: Procalcitonin: 0.19 ng/mL

## 2023-04-15 LAB — GLUCOSE, CAPILLARY
Glucose-Capillary: 146 mg/dL — ABNORMAL HIGH (ref 70–99)
Glucose-Capillary: 179 mg/dL — ABNORMAL HIGH (ref 70–99)
Glucose-Capillary: 148 mg/dL — ABNORMAL HIGH (ref 70–99)
Glucose-Capillary: 174 mg/dL — ABNORMAL HIGH (ref 70–99)
Glucose-Capillary: 94 mg/dL (ref 70–99)

## 2023-04-15 LAB — BASIC METABOLIC PANEL
Anion gap: 9 (ref 5–15)
BUN: 22 mg/dL — ABNORMAL HIGH (ref 6–20)
CO2: 29 mmol/L (ref 22–32)
Calcium: 8.5 mg/dL — ABNORMAL LOW (ref 8.9–10.3)
Chloride: 102 mmol/L (ref 98–111)
Creatinine, Ser: 0.66 mg/dL (ref 0.44–1.00)
GFR, Estimated: >60 mL/min (ref >60)
Glucose, Bld: 194 mg/dL — ABNORMAL HIGH (ref 70–99)
Potassium: 3.2 mmol/L — ABNORMAL LOW (ref 3.5–5.1)
Sodium: 140 mmol/L (ref 135–145)

## 2023-04-15 MED ORDER — POTASSIUM CHLORIDE 20 MEQ PO PACK
40.0000 meq | PACK | Freq: Two times a day (BID) | ORAL | Status: AC
  Administered 2023-04-15 (×2): 40 meq
  Filled 2023-04-15 (×2): qty 2

## 2023-04-15 NOTE — Progress Notes (Signed)
 RT placed patient on 6L 28% ATC at this time. No distress noted, patient currently tolerating well. RT will continue to monitor.

## 2023-04-15 NOTE — Plan of Care (Signed)
  Problem: Fluid Volume: Goal: Ability to maintain a balanced intake and output will improve Outcome: Progressing   Problem: Metabolic: Goal: Ability to maintain appropriate glucose levels will improve Outcome: Progressing   Problem: Nutritional: Goal: Maintenance of adequate nutrition will improve Outcome: Progressing Goal: Progress toward achieving an optimal weight will improve Outcome: Progressing   Problem: Skin Integrity: Goal: Risk for impaired skin integrity will decrease Outcome: Progressing   Problem: Tissue Perfusion: Goal: Adequacy of tissue perfusion will improve Outcome: Progressing   Problem: Cardiac: Goal: Ability to achieve and maintain adequate cardiopulmonary perfusion will improve Outcome: Progressing   Problem: Neurologic: Goal: Promote progressive neurologic recovery Outcome: Progressing   Problem: Skin Integrity: Goal: Risk for impaired skin integrity will be minimized. Outcome: Progressing   Problem: Coping: Goal: Psychosocial and spiritual needs will be supported Outcome: Progressing   Problem: Respiratory: Goal: Will maintain a patent airway Outcome: Progressing Goal: Complications related to the disease process, condition or treatment will be avoided or minimized Outcome: Progressing   Problem: Clinical Measurements: Goal: Ability to maintain clinical measurements within normal limits will improve Outcome: Progressing Goal: Will remain free from infection Outcome: Progressing Goal: Diagnostic test results will improve Outcome: Progressing Goal: Respiratory complications will improve Outcome: Progressing Goal: Cardiovascular complication will be avoided Outcome: Progressing   Problem: Nutrition: Goal: Adequate nutrition will be maintained Outcome: Progressing   Problem: Elimination: Goal: Will not experience complications related to bowel motility Outcome: Progressing   Problem: Pain Managment: Goal: General experience of  comfort will improve and/or be controlled Outcome: Progressing   Problem: Safety: Goal: Ability to remain free from injury will improve Outcome: Progressing   Problem: Skin Integrity: Goal: Risk for impaired skin integrity will decrease Outcome: Progressing   Problem: Cardiac: Goal: Ability to maintain an adequate cardiac output will improve Outcome: Progressing   Problem: Fluid Volume: Goal: Ability to achieve a balanced intake and output will improve Outcome: Progressing   Problem: Metabolic: Goal: Ability to maintain appropriate glucose levels will improve Outcome: Progressing   Problem: Nutritional: Goal: Maintenance of adequate nutrition will improve Outcome: Progressing Goal: Maintenance of adequate weight for body size and type will improve Outcome: Progressing   Problem: Respiratory: Goal: Will regain and/or maintain adequate ventilation Outcome: Progressing   Problem: Urinary Elimination: Goal: Ability to achieve and maintain adequate renal perfusion and functioning will improve Outcome: Progressing   Problem: Respiratory: Goal: Patent airway maintenance will improve Outcome: Progressing

## 2023-04-15 NOTE — Progress Notes (Signed)
 NAME:  Vickie Little, MRN:  [id number], DOB:  Jun 16, 1965, LOS: 13 ADMISSION DATE:  04/02/2023, CONSULTATION DATE:  04/02/2023 REFERRING MD:  Denton Lank - EDP, CHIEF COMPLAINT: Cardiac arrest    History of Present Illness:  58 year old female patient with history of metabolic syndrome, and coronary artery disease followed by cardiology last seen in January 2025.  Was at church singing in choir, had a witnessed collapse.  EMS was called on fire arrival the patient was agonal with weak pulses. On EMS arrival the patient was pulseless CPR was initiated, initial rhythm VF, ACLS interventions included epinephrine x 2, defibrillation x 5, amiodarone 300 mg.  An IO was placed in the right humerus she was intubated return of spontaneous circulation estimated at 15 minutes On arrival to [facility] initial EKG negative for ST elevation white blood cell count 11.7 glucose 408 post CPR lactic acid 10 Cardiology consulted by EDP Critical care asked to admit.  Pertinent Medical History:  Coronary artery disease, with prior angina seen by cardiology last in January 2025 type 2 diabetes with retinopathy, last A1c of record 9.5, hypertension, hyperlipidemia Remote herniated disc Obesity  Significant Hospital Events: Including procedures, antibiotic start and stop dates in addition to other pertinent events   2/9 Admitted status post VF arrest time to return of spontaneous circulation estimated at 15 minutes hemodynamically stable on arrival to the ER decorticate posturing 2/10: con't TTM, started on cleviprex  2/11: no change in mental status, needs off sedation for neuro eval 2/12: tachypnea/dysynchrony overnight on vent requiring prop back on at 15. On ttm overnight. Still on cleviprex  2/13: increased vent requirements overnight. Had mucous plug lavaged out. MRI equivocal for anoxic injury. Broadening abx, starting milrinone, lasix, steroids, intermittent paralytic.  2/14: coming down on vent settings,  remains on sedation 2/16: off pressors, neuro consulted. Milrinone d/c  2/18 perc tracheostomy 2/20 oozing from tracheostomy overnight, required lidocaine with epi injection with resolution of bleeding.  Antibiotics also resumed for Low-grade fever and worsening leukocytosis 2/21 started on first trach collar trial, no further bleeding from tracheostomy site  Interim History / Subjective:  No events. Some movement LLE. Family at bedside.  Objective:  Blood pressure (!) 108/58, pulse 75, temperature 99 F (37.2 C), temperature source Axillary, resp. rate 20, height 5\' 7"  (1.702 m), weight 81.9 kg, last menstrual period 03/25/2016, SpO2 99%.    Vent Mode: Stand-by FiO2 (%):  [28 %-40 %] 28 % Set Rate:  [30 bmp] 30 bmp Vt Set:  [360 mL] 360 mL PEEP:  [5 cmH20] 5 cmH20 Plateau Pressure:  [12 cmH20-17 cmH20] 12 cmH20   Intake/Output Summary (Last 24 hours) at 04/15/2023 1335 Last data filed at 04/15/2023 1300 Gross per 24 hour  Intake 3589.22 ml  Output 1725 ml  Net 1864.22 ml   Filed Weights   04/13/23 0500 04/14/23 0500 04/15/23 0300  Weight: 81.9 kg 79.1 kg 81.9 kg   Physical Examination: No distress RR in mid 20s without accessory muscle use on TC Abd soft, cortrak in place Moves LLE occasionally purposefully Flexor response to pain  Resolved Hospital Problem List:  Lactic acidosis Status post cardiac arrest Shock Status post V-fib cardiac arrest Aspiration PNA w/ ARDS Cardiogenic shock Hypernatremia  AKI  Assessment & Plan:    HFrEF w/ H/o CAD s/p VF arrest -Ischemic workup deferred for now pending neurological recovery (ie months down the line) plan Continue supportive care Continue scheduled amiodarone 200 mg via tube twice a day GDMT as  able Continue scheduled Lasix 80 mg daily Monitor electrolytes   Acute encephalopathy, anoxic brain injury Plan Minimize sedating meds Keep euglycemic Treat fevers Supportive care and time, some slow improvement  noted   Trach/vent dependent s/p cardiac arrest -on ATC during day, tires out at night Plan Would rest on full support at [facility] and resume daily ATC as tolerated working towards vent liberation if able  Cont VAP bundle PAD goal 0 PRN CXR Pulse ox Routine trach care Sutures to be removed after 2/25  COVID Plan Cont airborne precautions   Recurrent aspiration PNA NPF in sputum, WBC still elevated but procal re-assuring. MRSA PCR neg Plan  Zosyn to end 2/26  High risk for malnutrition w/ obesity  - Cor track in place.  plan Continue tube feeds via cortrack PEG to be considered based on clinical course  RD following. Would cont to follow in LTAC setting  Hypernatremia - resolved Plan BMP checks to MWF Continue oral lasix and FWF  Hyperglycemia plan Continue basal bolus regimen  H/o HTN plan As needed IV hydralazine  Disposition- medically stable for transfer to [facility] for ongoing vent wean   Best Practice (right click and "Reselect all SmartList Selections" daily)   Diet/type: Tube feeds DVT prophylaxis LMWH Pressure ulcer(s): N/A GI prophylaxis: H2B Lines: No longer needed.  Order written to d/c  Foley:  removal ordered  Code Status:  full code Last date of multidisciplinary goals of care discussion: updated brother in law at bedside 2/22  Myrla Halsted MD PCCM

## 2023-04-16 DIAGNOSIS — G931 Anoxic brain damage, not elsewhere classified: Secondary | ICD-10-CM | POA: Diagnosis not present

## 2023-04-16 DIAGNOSIS — J9601 Acute respiratory failure with hypoxia: Secondary | ICD-10-CM | POA: Diagnosis not present

## 2023-04-16 DIAGNOSIS — I469 Cardiac arrest, cause unspecified: Secondary | ICD-10-CM | POA: Diagnosis not present

## 2023-04-16 DIAGNOSIS — R509 Fever, unspecified: Secondary | ICD-10-CM | POA: Diagnosis not present

## 2023-04-16 LAB — GLUCOSE, CAPILLARY
Glucose-Capillary: 157 mg/dL — ABNORMAL HIGH (ref 70–99)
Glucose-Capillary: 118 mg/dL — ABNORMAL HIGH (ref 70–99)
Glucose-Capillary: 115 mg/dL — ABNORMAL HIGH (ref 70–99)
Glucose-Capillary: 158 mg/dL — ABNORMAL HIGH (ref 70–99)
Glucose-Capillary: 150 mg/dL — ABNORMAL HIGH (ref 70–99)
Glucose-Capillary: 156 mg/dL — ABNORMAL HIGH (ref 70–99)

## 2023-04-16 MED ORDER — FREE WATER
300.0000 mL | Status: DC
  Administered 2023-04-17 – 2023-04-18 (×10): 300 mL

## 2023-04-16 MED ORDER — POTASSIUM CHLORIDE 20 MEQ PO PACK
40.0000 meq | PACK | Freq: Once | ORAL | Status: AC
  Administered 2023-04-16: 40 meq
  Filled 2023-04-16: qty 2

## 2023-04-16 MED ORDER — ORAL CARE MOUTH RINSE
15.0000 mL | OROMUCOSAL | Status: DC
Start: 2023-04-16 — End: 2023-04-16

## 2023-04-16 NOTE — Progress Notes (Signed)
 NAME:  TARAN HABLE, MRN:  [id number], DOB:  06-17-1965, LOS: 14 ADMISSION DATE:  04/02/2023, CONSULTATION DATE:  04/02/2023 REFERRING MD:  Denton Lank - EDP, CHIEF COMPLAINT: Cardiac arrest    History of Present Illness:  58 year old female patient with history of metabolic syndrome, and coronary artery disease followed by cardiology last seen in January 2025.  Was at church singing in choir, had a witnessed collapse.  EMS was called on fire arrival the patient was agonal with weak pulses. On EMS arrival the patient was pulseless CPR was initiated, initial rhythm VF, ACLS interventions included epinephrine x 2, defibrillation x 5, amiodarone 300 mg.  An IO was placed in the right humerus she was intubated return of spontaneous circulation estimated at 15 minutes On arrival to [facility] initial EKG negative for ST elevation white blood cell count 11.7 glucose 408 post CPR lactic acid 10 Cardiology consulted by EDP Critical care asked to admit.  Pertinent Medical History:  Coronary artery disease, with prior angina seen by cardiology last in January 2025 type 2 diabetes with retinopathy, last A1c of record 9.5, hypertension, hyperlipidemia Remote herniated disc Obesity  Significant Hospital Events: Including procedures, antibiotic start and stop dates in addition to other pertinent events   2/9 Admitted status post VF arrest time to return of spontaneous circulation estimated at 15 minutes hemodynamically stable on arrival to the ER decorticate posturing 2/10: con't TTM, started on cleviprex  2/11: no change in mental status, needs off sedation for neuro eval 2/12: tachypnea/dysynchrony overnight on vent requiring prop back on at 15. On ttm overnight. Still on cleviprex  2/13: increased vent requirements overnight. Had mucous plug lavaged out. MRI equivocal for anoxic injury. Broadening abx, starting milrinone, lasix, steroids, intermittent paralytic.  2/14: coming down on vent settings,  remains on sedation 2/16: off pressors, neuro consulted. Milrinone d/c  2/18 perc tracheostomy 2/20 oozing from tracheostomy overnight, required lidocaine with epi injection with resolution of bleeding.  Antibiotics also resumed for Low-grade fever and worsening leukocytosis 2/21 started on first trach collar trial, no further bleeding from tracheostomy site  Interim History / Subjective:  More responsive with family today.   Objective:  Blood pressure 130/71, pulse 91, temperature 98.3 F (36.8 C), temperature source Axillary, resp. rate (!) 31, height 5\' 7"  (1.702 m), weight 86.4 kg, last menstrual period 03/25/2016, SpO2 97%.    Vent Mode: Stand-by FiO2 (%):  [28 %-40 %] 28 % Set Rate:  [30 bmp] 30 bmp Vt Set:  [360 mL] 360 mL PEEP:  [5 cmH20] 5 cmH20 Plateau Pressure:  [9 cmH20] 9 cmH20   Intake/Output Summary (Last 24 hours) at 04/16/2023 1416 Last data filed at 04/16/2023 1400 Gross per 24 hour  Intake 3523.06 ml  Output 2398 ml  Net 1125.06 ml   Filed Weights   04/14/23 0500 04/15/23 0300 04/16/23 0500  Weight: 79.1 kg 81.9 kg 86.4 kg   Physical Examination: Ill appearing woman lying in bed in NAD Eyes open, tracking, only shaking her head no and will not nod yes even on command. Moving all extremities frequently, not clear if she is following commands.  S1S2, RRR Breathing comfortably on TC, moderate cough, occ rhonchi Abd obese, soft, NT No edema, no cyanosis Skin warm, dry, no rashes    Resolved Hospital Problem List:  Lactic acidosis Status post cardiac arrest Shock Status post V-fib cardiac arrest Aspiration PNA w/ ARDS Cardiogenic shock Hypernatremia  AKI  Assessment & Plan:    HFrEF w/ H/o  CAD s/p VF arrest -Ischemic workup deferred for now pending neurological recovery (ie months down the line) plan -con't supportive care -amiodarone 200mg  BID -con't aspirin, statin -can add GDMT as BP tolerates -lasix 80mg  daily -recheck BMP tomorrow    Acute encephalopathy, anoxic brain injury -avoid sedating meds -pain control per protocol -treat fevers, avoid major electrolyte swings, avoid hypocapnia -supportive care to give time for recovery -PT, OT reconsulted since she is more awake   Acute respiratory failure with hypoxia, trach/vent dependent s/p cardiac arrest -on ATC during day, tires out at night -ATC as tolerated; may still need nocturnal vent -trach care per protocol -pulmonary hygiene -zosyn to complete 7 days -wean FiO2 as able -SLP consult when able to work with them for PMV; secretions and inability to communicate currently are a barrier to using PMV  COVID -contact infection prevention tomorrow to see if we can come off precautions  Recurrent aspiration PNA -Zosyn x 7 days  High risk for malnutrition w/ obesity  -con't TF via cortrak -d/c reglan since tolerating TF  Hypernatremia - resolved -decrease FWF; recheck BMP tomorrow  Hyperglycemia, controlled -semglee 25 BID -SSI PRN -TF coverage aspart 8 units q4h  H/o HTN -PRN hydralazine IV for SBP >160  Disposition- approved and medically stable for LTAC, awaiting a bed  Updated sister and best friend at bedside today.  Best Practice (right click and "Reselect all SmartList Selections" daily)   Diet/type: Tube feeds DVT prophylaxis LMWH Pressure ulcer(s): N/A GI prophylaxis: H2B- can d/c once off vent Lines: N/A Foley:  N/A Code Status:  full code Last date of multidisciplinary goals of care discussion: updated brother in law at bedside 2/22   Steffanie Dunn, DO 04/16/23 7:20 PM [facility]  For contact information, see Amion. If no response to pager, please call PCCM consult pager. After hours, 7PM- 7AM, please call Elink.

## 2023-04-16 NOTE — Plan of Care (Signed)
  Problem: Metabolic: Goal: Ability to maintain appropriate glucose levels will improve Outcome: Progressing   Problem: Nutritional: Goal: Maintenance of adequate nutrition will improve Outcome: Progressing   Problem: Tissue Perfusion: Goal: Adequacy of tissue perfusion will improve Outcome: Progressing

## 2023-04-17 DIAGNOSIS — Z9911 Dependence on respirator [ventilator] status: Secondary | ICD-10-CM

## 2023-04-17 DIAGNOSIS — Z930 Tracheostomy status: Secondary | ICD-10-CM | POA: Diagnosis not present

## 2023-04-17 DIAGNOSIS — U071 COVID-19: Secondary | ICD-10-CM

## 2023-04-17 DIAGNOSIS — J690 Pneumonitis due to inhalation of food and vomit: Secondary | ICD-10-CM | POA: Diagnosis not present

## 2023-04-17 DIAGNOSIS — G931 Anoxic brain damage, not elsewhere classified: Secondary | ICD-10-CM

## 2023-04-17 DIAGNOSIS — I469 Cardiac arrest, cause unspecified: Secondary | ICD-10-CM | POA: Diagnosis not present

## 2023-04-17 LAB — COMPREHENSIVE METABOLIC PANEL
ALT: 59 U/L — ABNORMAL HIGH (ref 0–44)
AST: 41 U/L (ref 15–41)
Albumin: 2.2 g/dL — ABNORMAL LOW (ref 3.5–5.0)
Alkaline Phosphatase: 201 U/L — ABNORMAL HIGH (ref 38–126)
Anion gap: 10 (ref 5–15)
BUN: 17 mg/dL (ref 6–20)
CO2: 25 mmol/L (ref 22–32)
Calcium: 9.1 mg/dL (ref 8.9–10.3)
Chloride: 103 mmol/L (ref 98–111)
Creatinine, Ser: 0.9 mg/dL (ref 0.44–1.00)
GFR, Estimated: >60 mL/min (ref >60)
Glucose, Bld: 227 mg/dL — ABNORMAL HIGH (ref 70–99)
Potassium: 4.3 mmol/L (ref 3.5–5.1)
Sodium: 138 mmol/L (ref 135–145)
Total Bilirubin: 0.5 mg/dL (ref 0.0–1.2)
Total Protein: 5.9 g/dL — ABNORMAL LOW (ref 6.5–8.1)

## 2023-04-17 LAB — CBC
HCT: 28 % — ABNORMAL LOW (ref 36.0–46.0)
Hemoglobin: 8.9 g/dL — ABNORMAL LOW (ref 12.0–15.0)
MCH: 25.9 pg — ABNORMAL LOW (ref 26.0–34.0)
MCHC: 31.8 g/dL (ref 30.0–36.0)
MCV: 81.4 fL (ref 80.0–100.0)
Platelets: 453 10*3/uL — ABNORMAL HIGH (ref 150–400)
RBC: 3.44 MIL/uL — ABNORMAL LOW (ref 3.87–5.11)
RDW: 14.2 % (ref 11.5–15.5)
WBC: 11.8 10*3/uL — ABNORMAL HIGH (ref 4.0–10.5)
nRBC: 0 % (ref 0.0–0.2)

## 2023-04-17 LAB — GLUCOSE, CAPILLARY
Glucose-Capillary: 169 mg/dL — ABNORMAL HIGH (ref 70–99)
Glucose-Capillary: 107 mg/dL — ABNORMAL HIGH (ref 70–99)
Glucose-Capillary: 57 mg/dL — ABNORMAL LOW (ref 70–99)
Glucose-Capillary: 181 mg/dL — ABNORMAL HIGH (ref 70–99)
Glucose-Capillary: 219 mg/dL — ABNORMAL HIGH (ref 70–99)
Glucose-Capillary: 219 mg/dL — ABNORMAL HIGH (ref 70–99)
Glucose-Capillary: 175 mg/dL — ABNORMAL HIGH (ref 70–99)
Glucose-Capillary: 165 mg/dL — ABNORMAL HIGH (ref 70–99)
Glucose-Capillary: 102 mg/dL — ABNORMAL HIGH (ref 70–99)

## 2023-04-17 MED ORDER — OXYCODONE HCL 5 MG PO TABS
5.0000 mg | ORAL_TABLET | Freq: Four times a day (QID) | ORAL | Status: DC
  Administered 2023-04-17 – 2023-04-18 (×4): 5 mg
  Filled 2023-04-17 (×4): qty 1

## 2023-04-17 MED ORDER — DEXTROSE 50 % IV SOLN
12.5000 g | INTRAVENOUS | Status: AC
  Administered 2023-04-17: 12.5 g via INTRAVENOUS
  Filled 2023-04-17: qty 50

## 2023-04-17 MED ORDER — FUROSEMIDE 10 MG/ML IJ SOLN
40.0000 mg | Freq: Once | INTRAMUSCULAR | Status: AC
  Administered 2023-04-17: 40 mg via INTRAVENOUS
  Filled 2023-04-17: qty 4

## 2023-04-17 MED ORDER — INSULIN GLARGINE 100 UNIT/ML ~~LOC~~ SOLN
32.0000 [IU] | Freq: Two times a day (BID) | SUBCUTANEOUS | Status: DC
  Administered 2023-04-18: 32 [IU] via SUBCUTANEOUS
  Filled 2023-04-17 (×2): qty 0.32

## 2023-04-17 MED ORDER — INSULIN GLARGINE-YFGN 100 UNIT/ML ~~LOC~~ SOLN
32.0000 [IU] | Freq: Two times a day (BID) | SUBCUTANEOUS | Status: DC
Start: 2023-04-18 — End: 2023-04-17

## 2023-04-17 MED ORDER — POTASSIUM CHLORIDE 20 MEQ PO PACK
40.0000 meq | PACK | Freq: Once | ORAL | Status: AC
  Administered 2023-04-17: 40 meq
  Filled 2023-04-17: qty 2

## 2023-04-17 NOTE — Progress Notes (Signed)
 Pt taken off the ventilator at this time, cuff deflated and pt plaed on aerosol trach collar. Pt currently on 6L 28% trach collar without complication.

## 2023-04-17 NOTE — Progress Notes (Signed)
 Physical Therapy Treatment Patient Details Name: Vickie Little MRN: [id number] DOB: 1965/12/20 Today's Date: 04/17/2023   History of Present Illness 58 year old presenting to ED s/p cardiac arrest while singing in church. Pt underwent defibrillation, amiodarone and ACLS with ROSC at 15 minutes. Imaging revealed anoxic brain injury. COVID+ PMH: CAD, HTN, HLD, obesity, back surgery in 2018    PT Comments  Pt now opening eyes and demonstrating spontaneous bilat LE movement while in chair position in the bed. Pt continues to not follow commands, track with eyes, turn head to sound, or demo purposeful movement. Pt does become restless moving LEs and trying to elevate trunk however does not move to command. Continue to recommend LTAC to allow for increased time to improved alertness to be able to participate in aggressive therapy program. Acute PT to cont to follow.   If plan is discharge home, recommend the following:     Can travel by private vehicle        Equipment Recommendations   (TBD next venue)    Recommendations for Other Services       Precautions / Restrictions Precautions Precautions: Fall Precaution/Restrictions Comments: trach Restrictions Weight Bearing Restrictions Per Provider Order: No     Mobility  Bed Mobility               General bed mobility comments: pt dependent for rolling, sliding up in bed. PT moved into chair position in bed, no change in vitals. tolerated 10 min and left in upright position    Transfers                   General transfer comment: unable    Ambulation/Gait               General Gait Details: unable   Stairs             Wheelchair Mobility     Tilt Bed    Modified Rankin (Stroke Patients Only) Modified Rankin (Stroke Patients Only) Pre-Morbid Rankin Score: No significant disability Modified Rankin: Severe disability     Balance Overall balance assessment:  (pt unable to safely transfer to  EOB)                                          Communication Communication Communication: Impaired Factors Affecting Communication: Trach/intubated  Cognition Arousal: Lethargic Behavior During Therapy: Flat affect                           PT - Cognition Comments: pt opening eyes this date to name and loud clapping sound however does not track with eyes or turn head Following commands: Impaired Following commands impaired:  (no command follow)    Cueing    Exercises Other Exercises Other Exercises: PROM to bilat UE however pt keeping UEs in flexed synergy position. Pt very resistant to extending elbow and fingers Other Exercises: PROM to bilat LEs, pt without resistance    General Comments General comments (skin integrity, edema, etc.): VSS      Pertinent Vitals/Pain Pain Assessment Pain Assessment: Faces Faces Pain Scale: No hurt Pain Location: no response to noxious stimuli however resistant to bilat elbow extension and cervical rotation    Home Living  Prior Function            PT Goals (current goals can now be found in the care plan section) Acute Rehab PT Goals Patient Stated Goal: unable to state PT Goal Formulation: Patient unable to participate in goal setting Time For Goal Achievement: 04/26/23 Potential to Achieve Goals: Fair Progress towards PT goals: Progressing toward goals    Frequency    Min 1X/week      PT Plan      Co-evaluation              AM-PAC PT "6 Clicks" Mobility   Outcome Measure  Help needed turning from your back to your side while in a flat bed without using bedrails?: Total Help needed moving from lying on your back to sitting on the side of a flat bed without using bedrails?: Total Help needed moving to and from a bed to a chair (including a wheelchair)?: Total Help needed standing up from a chair using your arms (e.g., wheelchair or bedside chair)?:  Total Help needed to walk in hospital room?: Total Help needed climbing 3-5 steps with a railing? : Total 6 Click Score: 6    End of Session Equipment Utilized During Treatment: Oxygen Activity Tolerance: Patient limited by fatigue Patient left: in bed;with call bell/phone within reach;with family/visitor present (in chair position) Nurse Communication: Mobility status PT Visit Diagnosis: Difficulty in walking, not elsewhere classified (R26.2)     Time: 7829-5621 PT Time Calculation (min) (ACUTE ONLY): 21 min  Charges:    $Therapeutic Exercise: 8-22 mins PT General Charges $$ ACUTE PT VISIT: 1 Visit                     Lewis Shock, PT, DPT Acute Rehabilitation Services Secure chat preferred Office #: [phone]    Iona Hansen 04/17/2023, 2:03 PM

## 2023-04-17 NOTE — Plan of Care (Signed)
  Problem: Cardiac: Goal: Ability to achieve and maintain adequate cardiopulmonary perfusion will improve Outcome: Progressing   

## 2023-04-17 NOTE — TOC Progression Note (Signed)
 Transition of Care [facility]) - Progression Note    Patient Details  Name: Vickie Little MRN: [id number] Date of Birth: Dec 11, 1965  [facility] CM/SW Contact  Elliot Cousin, RN Phone Number: [phone] 04/17/2023, 1:45 PM  Clinical Narrative:    TOC CM spoke to [facility] rep, Cierra. Plan bed available on 04/18/2023, will contact CM if bed available.    Expected Discharge Plan: Long Term Acute Care (LTAC) Barriers to Discharge: Continued Medical Work up  Expected Discharge Plan and Services     Post Acute Care Choice: Long Term Acute Care (LTAC) Living arrangements for the past 2 months: Single Family Home                                       Social Determinants of Health (SDOH) Interventions SDOH Screenings   Food Insecurity: Patient Unable To Answer (04/08/2023)  Housing: Patient Unable To Answer (04/08/2023)  Transportation Needs: Patient Unable To Answer (04/08/2023)  Utilities: Patient Unable To Answer (04/08/2023)  Tobacco Use: Medium Risk (04/02/2023)    Readmission Risk Interventions     No data to display

## 2023-04-17 NOTE — Progress Notes (Addendum)
 NAME:  Vickie Little, MRN:  [id number], DOB:  27-Jan-1966, LOS: 15 ADMISSION DATE:  04/02/2023, CONSULTATION DATE:  04/02/2023 REFERRING MD:  Denton Lank - EDP, CHIEF COMPLAINT: Cardiac arrest    History of Present Illness:  58 year old female patient with history of metabolic syndrome, and coronary artery disease followed by cardiology last seen in January 2025.  Was at church singing in choir, had a witnessed collapse.  EMS was called on fire arrival the patient was agonal with weak pulses. On EMS arrival the patient was pulseless CPR was initiated, initial rhythm VF, ACLS interventions included epinephrine x 2, defibrillation x 5, amiodarone 300 mg.  An IO was placed in the right humerus she was intubated return of spontaneous circulation estimated at 15 minutes On arrival to [facility] initial EKG negative for ST elevation white blood cell count 11.7 glucose 408 post CPR lactic acid 10 Cardiology consulted by EDP Critical care asked to admit.  Pertinent Medical History:  Coronary artery disease, with prior angina seen by cardiology last in January 2025 type 2 diabetes with retinopathy, last A1c of record 9.5, hypertension, hyperlipidemia Remote herniated disc Obesity  Significant Hospital Events: Including procedures, antibiotic start and stop dates in addition to other pertinent events   2/9 Admitted status post VF arrest time to return of spontaneous circulation estimated at 15 minutes hemodynamically stable on arrival to the ER decorticate posturing 2/10: con't TTM, started on cleviprex  2/11: no change in mental status, needs off sedation for neuro eval 2/12: tachypnea/dysynchrony overnight on vent requiring prop back on at 15. On ttm overnight. Still on cleviprex  2/13: increased vent requirements overnight. Had mucous plug lavaged out. MRI equivocal for anoxic injury. Broadening abx, starting milrinone, lasix, steroids, intermittent paralytic.  2/14: coming down on vent settings,  remains on sedation 2/16: off pressors, neuro consulted. Milrinone d/c  2/18 perc tracheostomy 2/20 oozing from tracheostomy overnight, required lidocaine with epi injection with resolution of bleeding.  Antibiotics also resumed for Low-grade fever and worsening leukocytosis 2/21 started on first trach collar trial, no further bleeding from tracheostomy site 2/24 course stable. Vent at night ATC during the day. Ongoing agitation issues requiring PRN sedation.   Interim History / Subjective:  No acute events On vent overnight Agitation this morning requiring PRN sedation.    Objective:  Blood pressure 118/72, pulse 74, temperature 99.2 F (37.3 C), temperature source Oral, resp. rate 18, height 5\' 7"  (1.702 m), weight 82.3 kg, last menstrual period 03/25/2016, SpO2 99%.    Vent Mode: PRVC FiO2 (%):  [28 %-40 %] 28 % Set Rate:  [30 bmp] 30 bmp Vt Set:  [360 mL] 360 mL PEEP:  [5 cmH20] 5 cmH20 Plateau Pressure:  [12 cmH20-16 cmH20] 16 cmH20   Intake/Output Summary (Last 24 hours) at 04/17/2023 1122 Last data filed at 04/17/2023 0800 Gross per 24 hour  Intake 2484.91 ml  Output 1238 ml  Net 1246.91 ml   Filed Weights   04/15/23 0300 04/16/23 0500 04/17/23 0200  Weight: 81.9 kg 86.4 kg 82.3 kg   Physical Examination: Overweight middle aged female in NAD Sedate RASS -3. Not following commands (Had just received fentanyl) RRR, S1S2 no MRG Breathing comfortably on trach collar.  28% Soft, NT, ND No acute deformity or edema.  Skin grossly intact.     Resolved Hospital Problem List:  Lactic acidosis Status post cardiac arrest Shock Status post V-fib cardiac arrest Aspiration PNA w/ ARDS Cardiogenic shock Hypernatremia  AKI  Assessment & Plan:  HFrEF w/ H/o CAD s/p VF arrest -Ischemic workup deferred for now pending neurological recovery (ie months down the line) plan -amiodarone 200mg  BID -continue aspirin, statin -can add GDMT as BP tolerates, remains  borderline -PO lasix 80mg  daily -repeat BMP pending   Acute encephalopathy, anoxic brain injury -avoid sedating meds -add per tube oxycodone scheduled to minimize fentanyl sedation -pain control per protocol -supportive care to give time for recovery -PT, OT reconsulted since she is more awake -Will need rehab in LTAC setting.    Acute respiratory failure with hypoxia, trach/vent dependent s/p cardiac arrest -on ATC during day, tires out at night -Continue nocturnal vent -trach care per protocol -pulmonary hygiene -zosyn to complete 7 days -wean FiO2 as able -SLP consult when able to work with them for PMV; secretions and inability to communicate currently are a barrier to using PMV  COVID -contact infection prevention tomorrow to see if we can come off precautions  Recurrent aspiration PNA -Zosyn day 5/7  High risk for malnutrition w/ obesity  -con't TF via cortrak -s/p reglan course  Hypernatremia - resolved -decrease FWF; repeat BMP pending  Hyperglycemia, controlled -semglee 25 BID -SSI PRN -TF coverage aspart 8 units q4h  H/o HTN -PRN hydralazine IV for SBP >160  Disposition- approved and medically stable for LTAC, awaiting a bed  Family updated bedside 2/24  Best Practice (right click and "Reselect all SmartList Selections" daily)   Diet/type: Tube feeds DVT prophylaxis LMWH Pressure ulcer(s): N/A GI prophylaxis: H2B- can d/c once off vent Lines: N/A Foley:  N/A Code Status:  full code Last date of multidisciplinary goals of care discussion: updated brother in law at bedside 2/22    Joneen Roach, AGACNP-BC [facility]  See Amion for personal pager PCCM on call pager [phone] until 7pm. Please call Elink 7p-7a. [phone]  04/17/2023 11:41 AM

## 2023-04-18 ENCOUNTER — Institutional Professional Consult (permissible substitution)
Admission: AD | Admit: 2023-04-18 | Discharge: 2023-07-23 | Disposition: E | Payer: Self-pay | Source: Other Acute Inpatient Hospital | Attending: Internal Medicine | Admitting: Internal Medicine

## 2023-04-18 ENCOUNTER — Other Ambulatory Visit (HOSPITAL_COMMUNITY): Payer: Self-pay

## 2023-04-18 LAB — POCT I-STAT 7, (LYTES, BLD GAS, ICA,H+H)
Acid-Base Excess: 2 mmol/L (ref 0.0–2.0)
Bicarbonate: 26.1 mmol/L (ref 20.0–28.0)
Calcium, Ion: 1.24 mmol/L (ref 1.15–1.40)
HCT: 25 % — ABNORMAL LOW (ref 36.0–46.0)
Hemoglobin: 8.5 g/dL — ABNORMAL LOW (ref 12.0–15.0)
O2 Saturation: 95 %
Patient temperature: 98.7
Potassium: 3.9 mmol/L (ref 3.5–5.1)
Sodium: 135 mmol/L (ref 135–145)
TCO2: 27 mmol/L (ref 22–32)
pCO2 arterial: 36.1 mmHg (ref 32–48)
pH, Arterial: 7.468 — ABNORMAL HIGH (ref 7.35–7.45)
pO2, Arterial: 70 mmHg — ABNORMAL LOW (ref 83–108)

## 2023-04-18 LAB — GLUCOSE, CAPILLARY
Glucose-Capillary: 127 mg/dL — ABNORMAL HIGH (ref 70–99)
Glucose-Capillary: 181 mg/dL — ABNORMAL HIGH (ref 70–99)
Glucose-Capillary: 138 mg/dL — ABNORMAL HIGH (ref 70–99)

## 2023-04-18 LAB — BASIC METABOLIC PANEL
Anion gap: 6 (ref 5–15)
BUN: 17 mg/dL (ref 6–20)
CO2: 25 mmol/L (ref 22–32)
Calcium: 8.3 mg/dL — ABNORMAL LOW (ref 8.9–10.3)
Chloride: 102 mmol/L (ref 98–111)
Creatinine, Ser: 0.82 mg/dL (ref 0.44–1.00)
GFR, Estimated: >60 mL/min (ref >60)
Glucose, Bld: 190 mg/dL — ABNORMAL HIGH (ref 70–99)
Potassium: 4 mmol/L (ref 3.5–5.1)
Sodium: 133 mmol/L — ABNORMAL LOW (ref 135–145)

## 2023-04-18 MED ORDER — OXYCODONE HCL 5 MG PO TABS
2.5000 mg | ORAL_TABLET | Freq: Three times a day (TID) | ORAL | Status: DC
  Administered 2023-04-18: 2.5 mg
  Filled 2023-04-18: qty 1

## 2023-04-18 MED ORDER — PROSOURCE TF20 ENFIT COMPATIBL EN LIQD: 60.0000 mL | Freq: Every day | ENTERAL | Status: DC

## 2023-04-18 MED ORDER — DEXTROSE 50 % IV SOLN
0.0000 mL | INTRAVENOUS | Status: DC | PRN
Start: 2023-04-18 — End: 2023-06-25

## 2023-04-18 MED ORDER — ATORVASTATIN CALCIUM 80 MG PO TABS: 80.0000 mg | ORAL_TABLET | Freq: Every day | ORAL | Status: DC

## 2023-04-18 MED ORDER — FREE WATER: 300.0000 mL | Status: DC

## 2023-04-18 MED ORDER — FENTANYL CITRATE PF 50 MCG/ML IJ SOSY: 25.0000 ug | PREFILLED_SYRINGE | INTRAMUSCULAR | Status: DC | PRN

## 2023-04-18 MED ORDER — VITAL 1.5 CAL PO LIQD: 1000.0000 mL | ORAL | Status: DC

## 2023-04-18 MED ORDER — AMIODARONE HCL 200 MG PO TABS: 200.0000 mg | ORAL_TABLET | Freq: Two times a day (BID) | ORAL | Status: DC

## 2023-04-18 MED ORDER — INSULIN GLARGINE 100 UNIT/ML ~~LOC~~ SOLN: 32.0000 [IU] | Freq: Two times a day (BID) | SUBCUTANEOUS | Status: DC

## 2023-04-18 MED ORDER — IPRATROPIUM-ALBUTEROL 0.5-2.5 (3) MG/3ML IN SOLN: 3.0000 mL | RESPIRATORY_TRACT | Status: DC | PRN

## 2023-04-18 MED ORDER — LABETALOL HCL 5 MG/ML IV SOLN: 10.0000 mg | Freq: Four times a day (QID) | INTRAVENOUS | Status: DC | PRN

## 2023-04-18 MED ORDER — HYDRALAZINE HCL 20 MG/ML IJ SOLN: 10.0000 mg | INTRAMUSCULAR | Status: DC | PRN

## 2023-04-18 MED ORDER — FUROSEMIDE 80 MG PO TABS: 80.0000 mg | ORAL_TABLET | Freq: Every day | ORAL | Status: DC

## 2023-04-18 MED ORDER — ONDANSETRON HCL 4 MG/2ML IJ SOLN: 4.0000 mg | Freq: Four times a day (QID) | INTRAMUSCULAR | Status: DC | PRN

## 2023-04-18 MED ORDER — INSULIN ASPART 100 UNIT/ML IJ SOLN
0.0000 [IU] | INTRAMUSCULAR | Status: DC
Start: 2023-04-18 — End: 2023-06-25

## 2023-04-18 MED ORDER — PIPERACILLIN-TAZOBACTAM 3.375 G IVPB: 3.3750 g | Freq: Three times a day (TID) | INTRAVENOUS | Status: DC

## 2023-04-18 MED ORDER — OXYCODONE HCL 5 MG PO TABS: 2.5000 mg | ORAL_TABLET | Freq: Three times a day (TID) | ORAL | Status: DC

## 2023-04-18 MED ORDER — DOCUSATE SODIUM 50 MG/5ML PO LIQD: 100.0000 mg | Freq: Two times a day (BID) | ORAL | Status: DC

## 2023-04-18 MED ORDER — ENOXAPARIN SODIUM 40 MG/0.4ML IJ SOSY: 40.0000 mg | PREFILLED_SYRINGE | INTRAMUSCULAR | Status: DC

## 2023-04-18 MED ORDER — INSULIN ASPART 100 UNIT/ML IJ SOLN: 8.0000 [IU] | INTRAMUSCULAR | Status: DC

## 2023-04-18 MED ORDER — POLYETHYLENE GLYCOL 3350 17 G PO PACK: 17.0000 g | PACK | Freq: Every day | ORAL | Status: DC

## 2023-04-18 MED ORDER — FAMOTIDINE 20 MG PO TABS: 20.0000 mg | ORAL_TABLET | Freq: Two times a day (BID) | ORAL | Status: DC

## 2023-04-18 NOTE — Evaluation (Signed)
 Occupational Therapy Evaluation Patient Details Name: Vickie Little MRN: [id number] DOB: May 18, 1965 Today's Date: 04/18/2023   History of Present Illness   58 year old presenting to ED s/p cardiac arrest while singing in church. Pt underwent defibrillation, amiodarone and ACLS with ROSC at 15 minutes. Imaging revealed anoxic brain injury. COVID+ PMH: CAD, HTN, HLD, obesity, back surgery in 2018     Clinical Impressions PTA, pt lives with family and completely independent with daily tasks. Pt presents now w/ deficits in cognition, coordination, balance and strength. Pt opens eyes to verbal stimuli, preference for R sided gaze but does attempt to track to L side. Significant spasticity noted in BUE with PROM and tendency for flexed posturing placing pt at high risk for contractures. Contacted MD and ortho tech for delivery of B resting hand splints prior to DC to next level of care. Noted grimacing for cervical stretching towards L side, repositioned in more neutral position at end of session.      If plan is discharge home, recommend the following:   A lot of help with walking and/or transfers;Two people to help with walking and/or transfers;A lot of help with bathing/dressing/bathroom;Two people to help with bathing/dressing/bathroom     Functional Status Assessment   Patient has had a recent decline in their functional status and demonstrates the ability to make significant improvements in function in a reasonable and predictable amount of time.     Equipment Recommendations   [employer];Hospital bed     Recommendations for Other Services         Precautions/Restrictions   Precautions Precautions: Fall Restrictions Weight Bearing Restrictions Per Provider Order: No     Mobility Bed Mobility                    Transfers                          Balance                                           ADL either performed or  assessed with clinical judgement   ADL Overall ADL's : Needs assistance/impaired                                       General ADL Comments: Total A for all ADLs     Vision Baseline Vision/History: 1 Wears glasses Patient Visual Report: Other (comment) (to be further assessed) Vision Assessment?: Vision impaired- to be further tested in functional context Additional Comments: to be further assessed     Perception         Praxis         Pertinent Vitals/Pain Pain Assessment Pain Assessment: Faces Faces Pain Scale: No hurt     Extremity/Trunk Assessment Upper Extremity Assessment Upper Extremity Assessment: Difficult to assess due to impaired cognition;LUE deficits/detail;RUE deficits/detail RUE Deficits / Details: Increased tone noted at all joints but able to be stretched with increased time. Spasticity worse in elbow w/ tendency to return to flexed position after PROM. LUE Deficits / Details: Increased tone noted at all joints but able to be stretched with increased time. Spasticity worse in elbow w/ tendency to return to flexed position after PROM.   Lower Extremity Assessment Lower Extremity  Assessment: Defer to PT evaluation   Cervical / Trunk Assessment Cervical / Trunk Assessment: Normal   Communication Communication Communication: Impaired Factors Affecting Communication: Trach/intubated   Cognition Arousal: Alert Behavior During Therapy: Flat affect Cognition: Difficult to assess Difficult to assess due to: Tracheostomy, Impaired communication           OT - Cognition Comments: opens eyes to name being called and voices in room. attempting to track to noise                 Following commands: Impaired Following commands impaired: Follows one step commands inconsistently     Cueing  General Comments   Cueing Techniques: Verbal cues;Gestural cues;Tactile cues;Visual cues      Exercises Exercises: Other exercises Other  Exercises Other Exercises: PROM to B UE   Shoulder Instructions      Home Living Family/patient expects to be discharged to:: Other (Comment)                                 Additional Comments: Previously living at home w/ spouse. Plan for DC to [facility]      Prior Functioning/Environment Prior Level of Function : Independent/Modified Independent;Driving;Working/employed             Mobility Comments: No AD ADLs Comments: works for [employer], sings in choir    OT Problem List: Decreased range of motion;Decreased activity tolerance;Impaired balance (sitting and/or standing);Impaired vision/perception;Decreased coordination;Decreased cognition;Impaired UE functional use;Impaired tone   OT Treatment/Interventions: Self-care/ADL training;Therapeutic exercise;Energy conservation;DME and/or AE instruction;Therapeutic activities;Patient/family education;Balance training      OT Goals(Current goals can be found in the care plan section)   Acute Rehab OT Goals Patient Stated Goal: none stated; family hopeful for pt cognition/alertness to improve OT Goal Formulation: Patient unable to participate in goal setting Time For Goal Achievement: 05/02/23 Potential to Achieve Goals: Fair   OT Frequency:  Min 1X/week    Co-evaluation              AM-PAC OT "6 Clicks" Daily Activity     Outcome Measure Help from another person eating meals?: Total Help from another person taking care of personal grooming?: Total Help from another person toileting, which includes using toliet, bedpan, or urinal?: Total Help from another person bathing (including washing, rinsing, drying)?: Total Help from another person to put on and taking off regular upper body clothing?: Total Help from another person to put on and taking off regular lower body clothing?: Total 6 Click Score: 6   End of Session Equipment Utilized During Treatment: Oxygen Nurse Communication:  Mobility status  Activity Tolerance: Patient tolerated treatment well Patient left: in bed;with call bell/phone within reach  OT Visit Diagnosis: Other abnormalities of gait and mobility (R26.89);Muscle weakness (generalized) (M62.81)                Time: 1040-1056 OT Time Calculation (min): 16 min Charges:  OT General Charges $OT Visit: 1 Visit OT Evaluation $OT Eval Moderate Complexity: 1 Mod  Bradd Canary, OTR/L Acute Rehab Services Office: [phone]   Lorre Munroe 04/18/2023, 12:21 PM

## 2023-04-18 NOTE — TOC Transition Note (Signed)
 Transition of Care [facility]) - Discharge Note   Patient Details  Name: Vickie Little MRN: [id number] Date of Birth: Jul 20, 1965  [facility]) CM/SW Contact:  Gala Lewandowsky, RN Phone Number: 04/18/2023, 11:56 AM   Clinical Narrative: [facility] Admissions Coordinator states patient has bed available. Liaison has contacted the physician and Staff RN. No further needs identified at this time.   Final next level of care: Long Term Acute Care (LTAC) Barriers to Discharge: No Barriers Identified   Patient Goals and CMS Choice   CMS Medicare.gov Compare Post Acute Care list provided to:: Patient Represenative (must comment) Choice offered to / list presented to : Spouse   Discharge Plan and Services Additional resources added to the After Visit Summary for       Post Acute Care Choice: Long Term Acute Care (LTAC)           Social Drivers of Health (SDOH) Interventions SDOH Screenings   Food Insecurity: Patient Unable To Answer (04/08/2023)  Housing: Patient Unable To Answer (04/08/2023)  Transportation Needs: Patient Unable To Answer (04/08/2023)  Utilities: Patient Unable To Answer (04/08/2023)  Tobacco Use: Medium Risk (04/02/2023)   Readmission Risk Interventions     No data to display

## 2023-04-18 NOTE — Progress Notes (Signed)
 Orthopedic Tech Progress Note Patient Details:  BRETTE CAST 23-Aug-1965 [id number]  Spoke with OT about some RESTING HAND SPLINTS so I called in order to [facility]   Patient ID: Sharlett Iles, female   DOB: [birth year], 58 y.o.   MRN: [id number]  Donald Pore 04/18/2023, 11:39 AM

## 2023-04-18 NOTE — Progress Notes (Signed)
 4 sutures removed at this time from phlange of patients trach. Pt tolerated well.

## 2023-04-18 NOTE — Progress Notes (Signed)
 Routine trach care provided to patient at this time. Pt cuff then deflated and pt was removed from ventilator and placed on 6L 28% aerosol trach collar at this time without complication. RT will continue to monitor and be available as needed.

## 2023-04-19 DIAGNOSIS — Z930 Tracheostomy status: Secondary | ICD-10-CM | POA: Diagnosis not present

## 2023-04-19 DIAGNOSIS — J9621 Acute and chronic respiratory failure with hypoxia: Secondary | ICD-10-CM | POA: Diagnosis not present

## 2023-04-19 DIAGNOSIS — I462 Cardiac arrest due to underlying cardiac condition: Secondary | ICD-10-CM | POA: Diagnosis not present

## 2023-04-19 DIAGNOSIS — J159 Unspecified bacterial pneumonia: Secondary | ICD-10-CM

## 2023-04-19 DIAGNOSIS — Y95 Nosocomial condition: Secondary | ICD-10-CM

## 2023-04-19 DIAGNOSIS — G931 Anoxic brain damage, not elsewhere classified: Secondary | ICD-10-CM | POA: Diagnosis not present

## 2023-04-19 LAB — COMPREHENSIVE METABOLIC PANEL
ALT: 54 U/L — ABNORMAL HIGH (ref 0–44)
AST: 46 U/L — ABNORMAL HIGH (ref 15–41)
Albumin: 2.2 g/dL — ABNORMAL LOW (ref 3.5–5.0)
Alkaline Phosphatase: 185 U/L — ABNORMAL HIGH (ref 38–126)
Anion gap: 6 (ref 5–15)
BUN: 17 mg/dL (ref 6–20)
CO2: 25 mmol/L (ref 22–32)
Calcium: 8.5 mg/dL — ABNORMAL LOW (ref 8.9–10.3)
Chloride: 105 mmol/L (ref 98–111)
Creatinine, Ser: 0.82 mg/dL (ref 0.44–1.00)
GFR, Estimated: >60 mL/min (ref >60)
Glucose, Bld: 130 mg/dL — ABNORMAL HIGH (ref 70–99)
Potassium: 3.5 mmol/L (ref 3.5–5.1)
Sodium: 136 mmol/L (ref 135–145)
Total Bilirubin: 0.5 mg/dL (ref 0.0–1.2)
Total Protein: 5.7 g/dL — ABNORMAL LOW (ref 6.5–8.1)

## 2023-04-19 LAB — CBC
HCT: 25 % — ABNORMAL LOW (ref 36.0–46.0)
Hemoglobin: 8.2 g/dL — ABNORMAL LOW (ref 12.0–15.0)
MCH: 26.3 pg (ref 26.0–34.0)
MCHC: 32.8 g/dL (ref 30.0–36.0)
MCV: 80.1 fL (ref 80.0–100.0)
Platelets: 515 10*3/uL — ABNORMAL HIGH (ref 150–400)
RBC: 3.12 MIL/uL — ABNORMAL LOW (ref 3.87–5.11)
RDW: 14 % (ref 11.5–15.5)
WBC: 10.3 10*3/uL (ref 4.0–10.5)
nRBC: 0 % (ref 0.0–0.2)

## 2023-04-19 LAB — HEMOGLOBIN A1C
Hgb A1c MFr Bld: 9.1 % — ABNORMAL HIGH (ref 4.8–5.6)
Mean Plasma Glucose: 214.47 mg/dL

## 2023-04-20 DIAGNOSIS — J159 Unspecified bacterial pneumonia: Secondary | ICD-10-CM

## 2023-04-20 DIAGNOSIS — Y95 Nosocomial condition: Secondary | ICD-10-CM

## 2023-04-20 DIAGNOSIS — J9621 Acute and chronic respiratory failure with hypoxia: Secondary | ICD-10-CM | POA: Diagnosis not present

## 2023-04-20 DIAGNOSIS — Z930 Tracheostomy status: Secondary | ICD-10-CM | POA: Diagnosis not present

## 2023-04-20 DIAGNOSIS — G931 Anoxic brain damage, not elsewhere classified: Secondary | ICD-10-CM | POA: Diagnosis not present

## 2023-04-20 DIAGNOSIS — I462 Cardiac arrest due to underlying cardiac condition: Secondary | ICD-10-CM | POA: Diagnosis not present

## 2023-04-22 ENCOUNTER — Other Ambulatory Visit (HOSPITAL_COMMUNITY): Payer: Self-pay

## 2023-04-22 DEATH — deceased

## 2023-04-24 ENCOUNTER — Other Ambulatory Visit (HOSPITAL_COMMUNITY): Payer: Self-pay

## 2023-04-24 LAB — BLOOD GAS, ARTERIAL
Acid-Base Excess: 1.4 mmol/L (ref 0.0–2.0)
Bicarbonate: 24.9 mmol/L (ref 20.0–28.0)
O2 Saturation: 98.9 %
Patient temperature: 37
pCO2 arterial: 35 mmHg (ref 32–48)
pH, Arterial: 7.46 — ABNORMAL HIGH (ref 7.35–7.45)
pO2, Arterial: 78 mmHg — ABNORMAL LOW (ref 83–108)

## 2023-04-24 MED ORDER — LIDOCAINE HCL 1 % IJ SOLN
20.0000 mL | Freq: Once | INTRAMUSCULAR | Status: AC
  Administered 2023-04-24: 5 mL

## 2023-04-24 MED ORDER — LIDOCAINE HCL 1 % IJ SOLN
INTRAMUSCULAR | Status: AC
  Filled 2023-04-24: qty 20

## 2023-04-24 NOTE — Procedures (Signed)
 PROCEDURE SUMMARY:  Successful image-guided right thoracentesis. Yielded 600 mL of hazy yellow fluid. Pt tolerated procedure well. No immediate complications. EBL = trace   Specimen was not sent for labs. CXR ordered.  Please see imaging section of Epic for full dictation.  Lynann Bologna Dyamon Sosinski PA-C 04/24/2023 9:20 AM

## 2023-04-25 LAB — CULTURE, RESPIRATORY W GRAM STAIN

## 2023-04-27 ENCOUNTER — Other Ambulatory Visit (HOSPITAL_COMMUNITY): Payer: Self-pay

## 2023-04-28 ENCOUNTER — Other Ambulatory Visit (HOSPITAL_COMMUNITY): Payer: Self-pay

## 2023-04-28 LAB — BASIC METABOLIC PANEL
Anion gap: 15 (ref 5–15)
BUN: 48 mg/dL — ABNORMAL HIGH (ref 6–20)
CO2: 22 mmol/L (ref 22–32)
Calcium: 9.3 mg/dL (ref 8.9–10.3)
Chloride: 106 mmol/L (ref 98–111)
Creatinine, Ser: 1.82 mg/dL — ABNORMAL HIGH (ref 0.44–1.00)
GFR, Estimated: 32 mL/min — ABNORMAL LOW (ref >60)
Glucose, Bld: 215 mg/dL — ABNORMAL HIGH (ref 70–99)
Potassium: 4.7 mmol/L (ref 3.5–5.1)
Sodium: 143 mmol/L (ref 135–145)

## 2023-04-28 LAB — CBC
HCT: 27 % — ABNORMAL LOW (ref 36.0–46.0)
Hemoglobin: 8.4 g/dL — ABNORMAL LOW (ref 12.0–15.0)
MCH: 26.1 pg (ref 26.0–34.0)
MCHC: 31.1 g/dL (ref 30.0–36.0)
MCV: 83.9 fL (ref 80.0–100.0)
Platelets: 399 10*3/uL (ref 150–400)
RBC: 3.22 MIL/uL — ABNORMAL LOW (ref 3.87–5.11)
RDW: 14.8 % (ref 11.5–15.5)
WBC: 9.4 10*3/uL (ref 4.0–10.5)
nRBC: 1.1 % — ABNORMAL HIGH (ref 0.0–0.2)

## 2023-04-28 LAB — MAGNESIUM: Magnesium: 2.2 mg/dL (ref 1.7–2.4)

## 2023-04-29 LAB — BASIC METABOLIC PANEL
Anion gap: 17 — ABNORMAL HIGH (ref 5–15)
BUN: 65 mg/dL — ABNORMAL HIGH (ref 6–20)
CO2: 20 mmol/L — ABNORMAL LOW (ref 22–32)
Calcium: 9 mg/dL (ref 8.9–10.3)
Chloride: 103 mmol/L (ref 98–111)
Creatinine, Ser: 2.34 mg/dL — ABNORMAL HIGH (ref 0.44–1.00)
GFR, Estimated: 24 mL/min — ABNORMAL LOW (ref >60)
Glucose, Bld: 223 mg/dL — ABNORMAL HIGH (ref 70–99)
Potassium: 4.8 mmol/L (ref 3.5–5.1)
Sodium: 140 mmol/L (ref 135–145)

## 2023-04-30 DIAGNOSIS — G931 Anoxic brain damage, not elsewhere classified: Secondary | ICD-10-CM | POA: Diagnosis not present

## 2023-04-30 DIAGNOSIS — I462 Cardiac arrest due to underlying cardiac condition: Secondary | ICD-10-CM | POA: Diagnosis not present

## 2023-04-30 DIAGNOSIS — Z930 Tracheostomy status: Secondary | ICD-10-CM | POA: Diagnosis not present

## 2023-04-30 DIAGNOSIS — Y95 Nosocomial condition: Secondary | ICD-10-CM

## 2023-04-30 DIAGNOSIS — J159 Unspecified bacterial pneumonia: Secondary | ICD-10-CM

## 2023-04-30 DIAGNOSIS — J9621 Acute and chronic respiratory failure with hypoxia: Secondary | ICD-10-CM | POA: Diagnosis not present

## 2023-04-30 LAB — BASIC METABOLIC PANEL
Anion gap: 11 (ref 5–15)
BUN: 59 mg/dL — ABNORMAL HIGH (ref 6–20)
CO2: 21 mmol/L — ABNORMAL LOW (ref 22–32)
Calcium: 8.6 mg/dL — ABNORMAL LOW (ref 8.9–10.3)
Chloride: 105 mmol/L (ref 98–111)
Creatinine, Ser: 1.72 mg/dL — ABNORMAL HIGH (ref 0.44–1.00)
GFR, Estimated: 35 mL/min — ABNORMAL LOW (ref >60)
Glucose, Bld: 155 mg/dL — ABNORMAL HIGH (ref 70–99)
Potassium: 4.1 mmol/L (ref 3.5–5.1)
Sodium: 137 mmol/L (ref 135–145)

## 2023-05-01 ENCOUNTER — Other Ambulatory Visit (HOSPITAL_COMMUNITY): Payer: Self-pay

## 2023-05-01 DIAGNOSIS — Z930 Tracheostomy status: Secondary | ICD-10-CM | POA: Diagnosis not present

## 2023-05-01 DIAGNOSIS — I462 Cardiac arrest due to underlying cardiac condition: Secondary | ICD-10-CM | POA: Diagnosis not present

## 2023-05-01 DIAGNOSIS — G931 Anoxic brain damage, not elsewhere classified: Secondary | ICD-10-CM | POA: Diagnosis not present

## 2023-05-01 DIAGNOSIS — Y95 Nosocomial condition: Secondary | ICD-10-CM

## 2023-05-01 DIAGNOSIS — J159 Unspecified bacterial pneumonia: Secondary | ICD-10-CM

## 2023-05-01 DIAGNOSIS — J9621 Acute and chronic respiratory failure with hypoxia: Secondary | ICD-10-CM | POA: Diagnosis not present

## 2023-05-01 LAB — BASIC METABOLIC PANEL
Anion gap: 7 (ref 5–15)
BUN: 48 mg/dL — ABNORMAL HIGH (ref 6–20)
CO2: 20 mmol/L — ABNORMAL LOW (ref 22–32)
Calcium: 8 mg/dL — ABNORMAL LOW (ref 8.9–10.3)
Chloride: 109 mmol/L (ref 98–111)
Creatinine, Ser: 1.21 mg/dL — ABNORMAL HIGH (ref 0.44–1.00)
GFR, Estimated: 53 mL/min — ABNORMAL LOW (ref >60)
Glucose, Bld: 163 mg/dL — ABNORMAL HIGH (ref 70–99)
Potassium: 4 mmol/L (ref 3.5–5.1)
Sodium: 136 mmol/L (ref 135–145)

## 2023-05-01 LAB — CULTURE, RESPIRATORY W GRAM STAIN

## 2023-05-01 NOTE — Progress Notes (Signed)
  Echocardiogram 2D Echocardiogram has been performed.  Ocie Doyne RDCS 05/01/2023, 11:53 AM

## 2023-05-02 DIAGNOSIS — G931 Anoxic brain damage, not elsewhere classified: Secondary | ICD-10-CM | POA: Diagnosis not present

## 2023-05-02 DIAGNOSIS — I462 Cardiac arrest due to underlying cardiac condition: Secondary | ICD-10-CM | POA: Diagnosis not present

## 2023-05-02 DIAGNOSIS — Y95 Nosocomial condition: Secondary | ICD-10-CM

## 2023-05-02 DIAGNOSIS — J159 Unspecified bacterial pneumonia: Secondary | ICD-10-CM

## 2023-05-02 DIAGNOSIS — Z930 Tracheostomy status: Secondary | ICD-10-CM | POA: Diagnosis not present

## 2023-05-02 DIAGNOSIS — J9621 Acute and chronic respiratory failure with hypoxia: Secondary | ICD-10-CM | POA: Diagnosis not present

## 2023-05-02 LAB — ECHOCARDIOGRAM COMPLETE
AR max vel: 2.33 cm2
AV Area VTI: 2.38 cm2
AV Area mean vel: 2.06 cm2
AV Mean grad: 2 mmHg
AV Peak grad: 3.3 mmHg
Ao pk vel: 0.91 m/s
Area-P 1/2: 5.13 cm2
Calc EF: 43.8 %
MV VTI: 2.03 cm2
S' Lateral: 4.2 cm
Single Plane A2C EF: 42.7 %
Single Plane A4C EF: 46.6 %
Weight: 2903.02 [oz_av]

## 2023-05-02 LAB — BASIC METABOLIC PANEL
Anion gap: 8 (ref 5–15)
BUN: 40 mg/dL — ABNORMAL HIGH (ref 6–20)
CO2: 20 mmol/L — ABNORMAL LOW (ref 22–32)
Calcium: 8.1 mg/dL — ABNORMAL LOW (ref 8.9–10.3)
Chloride: 107 mmol/L (ref 98–111)
Creatinine, Ser: 1.06 mg/dL — ABNORMAL HIGH (ref 0.44–1.00)
GFR, Estimated: >60 mL/min (ref >60)
Glucose, Bld: 295 mg/dL — ABNORMAL HIGH (ref 70–99)
Potassium: 4.3 mmol/L (ref 3.5–5.1)
Sodium: 135 mmol/L (ref 135–145)

## 2023-05-03 DIAGNOSIS — J9621 Acute and chronic respiratory failure with hypoxia: Secondary | ICD-10-CM | POA: Diagnosis not present

## 2023-05-03 DIAGNOSIS — I462 Cardiac arrest due to underlying cardiac condition: Secondary | ICD-10-CM | POA: Diagnosis not present

## 2023-05-03 DIAGNOSIS — J159 Unspecified bacterial pneumonia: Secondary | ICD-10-CM

## 2023-05-03 DIAGNOSIS — Z930 Tracheostomy status: Secondary | ICD-10-CM | POA: Diagnosis not present

## 2023-05-03 DIAGNOSIS — Y95 Nosocomial condition: Secondary | ICD-10-CM

## 2023-05-03 DIAGNOSIS — G931 Anoxic brain damage, not elsewhere classified: Secondary | ICD-10-CM | POA: Diagnosis not present

## 2023-05-03 LAB — CBC
HCT: 24.2 % — ABNORMAL LOW (ref 36.0–46.0)
Hemoglobin: 7.5 g/dL — ABNORMAL LOW (ref 12.0–15.0)
MCH: 25.9 pg — ABNORMAL LOW (ref 26.0–34.0)
MCHC: 31 g/dL (ref 30.0–36.0)
MCV: 83.4 fL (ref 80.0–100.0)
Platelets: 286 10*3/uL (ref 150–400)
RBC: 2.9 MIL/uL — ABNORMAL LOW (ref 3.87–5.11)
RDW: 15.9 % — ABNORMAL HIGH (ref 11.5–15.5)
WBC: 7.4 10*3/uL (ref 4.0–10.5)
nRBC: 1.8 % — ABNORMAL HIGH (ref 0.0–0.2)

## 2023-05-03 LAB — BASIC METABOLIC PANEL
Anion gap: 11 (ref 5–15)
BUN: 38 mg/dL — ABNORMAL HIGH (ref 6–20)
CO2: 20 mmol/L — ABNORMAL LOW (ref 22–32)
Calcium: 8.7 mg/dL — ABNORMAL LOW (ref 8.9–10.3)
Chloride: 107 mmol/L (ref 98–111)
Creatinine, Ser: 1.14 mg/dL — ABNORMAL HIGH (ref 0.44–1.00)
GFR, Estimated: 57 mL/min — ABNORMAL LOW (ref >60)
Glucose, Bld: 117 mg/dL — ABNORMAL HIGH (ref 70–99)
Potassium: 4.4 mmol/L (ref 3.5–5.1)
Sodium: 138 mmol/L (ref 135–145)

## 2023-05-03 LAB — PROTIME-INR
INR: 1.3 — ABNORMAL HIGH (ref 0.8–1.2)
Prothrombin Time: 16.7 s — ABNORMAL HIGH (ref 11.4–15.2)

## 2023-05-03 LAB — MAGNESIUM: Magnesium: 2.4 mg/dL (ref 1.7–2.4)

## 2023-05-03 NOTE — Consult Note (Signed)
 Chief Complaint: Patient was seen in consultation today for perc G-tube   Referring Physician(s): Dr. Vladimir Faster  Supervising Physician: Marliss Coots  Patient Status: [facility] - In-pt  History of Present Illness: Vickie Little is a 58 y.o. 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 She is currently in [facility] s/p tracheostomy. She has been getting TF via NGT. Family has elected to proceed with percutaneous gastrostomy.  IR is consulted for G-tube placement Imaging reviewed, anatomy amenable to percutaneous placement. Family at bedside  Past Medical History:  Diagnosis Date   Arthritis    CAD (coronary artery disease), native coronary artery 07/30/2018   Diabetes mellitus without complication (HCC)    type II    Heart attack (HCC)    Hypertension    NSTEMI (non-ST elevated myocardial infarction) (HCC) 02/08/2012    Past Surgical History:  Procedure Laterality Date   BREAST BIOPSY Right 01/10/2023   MM RT BREAST BX W LOC DEV 1ST LESION IMAGE BX SPEC STEREO GUIDE 01/10/2023 [facility] MAMMOGRAPHY   CORONARY ANGIOPLASTY WITH STENT PLACEMENT     IR THORACENTESIS ASP PLEURAL SPACE W/IMG GUIDE  04/24/2023   LEFT HEART CATHETERIZATION WITH CORONARY ANGIOGRAM N/A 02/13/2012   Procedure: LEFT HEART CATHETERIZATION WITH CORONARY ANGIOGRAM;  Surgeon: Donato Schultz, MD;  Location: [facility] CATH LAB;  Service: Cardiovascular;  Laterality: N/A;   LUMBAR LAMINECTOMY/DECOMPRESSION MICRODISCECTOMY Left 04/20/2016   Procedure: 1. central decompression for spinal stenosis   1.central decompression for spinal stenosis L5-S1  2.foramionotomy L5 root and S1nerve root left       3. Microdisectomy L5-S1 left  ;  Surgeon: Ranee Gosselin, MD;  Location: [facility] ORS;  Service: Orthopedics;  Laterality: Left;    Allergies: Patient has no known allergies.  Medications: Prior to Admission medications   Medication Sig Start Date End Date Taking? Authorizing Provider  amiodarone (PACERONE)  200 MG tablet Place 1 tablet (200 mg total) into feeding tube 2 (two) times daily. 04/18/23   Duayne Cal, NP  aspirin EC 81 MG tablet Take 81 mg by mouth daily.    [provider]  atorvastatin (LIPITOR) 20 MG tablet TAKE 1 TABLET(20 MG) BY MOUTH DAILY 06/24/19   Yates Decamp, MD  atorvastatin (LIPITOR) 80 MG tablet Place 1 tablet (80 mg total) into feeding tube daily. 04/19/23   Duayne Cal, NP  dextrose 50 % solution Inject 0-50 mLs into the vein as needed for low blood sugar. 04/18/23   Duayne Cal, NP  docusate (COLACE) 50 MG/5ML liquid Place 10 mLs (100 mg total) into feeding tube 2 (two) times daily. 04/18/23   Duayne Cal, NP  enoxaparin (LOVENOX) 40 MG/0.4ML injection Inject 0.4 mLs (40 mg total) into the skin daily. 04/19/23   Duayne Cal, NP  ezetimibe (ZETIA) 10 MG tablet Take 1 tablet (10 mg total) by mouth daily. 02/24/23   Tolia, Sunit, DO  famotidine (PEPCID) 20 MG tablet Place 1 tablet (20 mg total) into feeding tube 2 (two) times daily. 04/18/23   Duayne Cal, NP  fentaNYL (SUBLIMAZE) 50 MCG/ML injection Inject 0.5 mLs (25 mcg total) into the vein every 15 (fifteen) minutes as needed (to achieve RASS & CPOT goal.). 04/18/23   Duayne Cal, NP  furosemide (LASIX) 80 MG tablet Take 1 tablet (80 mg total) by mouth daily. 04/19/23   Duayne Cal, NP  hydrALAZINE (APRESOLINE) 20 MG/ML injection Inject 0.5 mLs (10 mg total) into the vein every 4 (four) hours  as needed. 04/18/23   Duayne Cal, NP  insulin aspart (NOVOLOG) 100 UNIT/ML injection Inject 0-20 Units into the skin every 4 (four) hours. 04/18/23   Duayne Cal, NP  insulin aspart (NOVOLOG) 100 UNIT/ML injection Inject 8 Units into the skin every 4 (four) hours. 04/18/23   Duayne Cal, NP  insulin glargine (LANTUS) 100 UNIT/ML injection Inject 0.32 mLs (32 Units total) into the skin 2 (two) times daily. 04/18/23   Duayne Cal, NP  ipratropium-albuterol (DUONEB) 0.5-2.5 (3) MG/3ML SOLN Take 3 mLs  by nebulization every 4 (four) hours as needed. 04/18/23   Duayne Cal, NP  labetalol (NORMODYNE) 5 MG/ML injection Inject 2 mLs (10 mg total) into the vein every 6 (six) hours as needed (SBP >160). 04/18/23   Duayne Cal, NP  Nutritional Supplements (FEEDING SUPPLEMENT, VITAL 1.5 CAL,) LIQD Place 1,000 mLs into feeding tube continuous. 04/18/23   Duayne Cal, NP  ondansetron (ZOFRAN) 4 MG/2ML SOLN injection Inject 2 mLs (4 mg total) into the vein every 6 (six) hours as needed for nausea. 04/18/23   Duayne Cal, NP  oxyCODONE (OXY IR/ROXICODONE) 5 MG immediate release tablet Place 0.5 tablets (2.5 mg total) into feeding tube every 8 (eight) hours. 04/18/23   Duayne Cal, NP  piperacillin-tazobactam (ZOSYN) 3.375 GM/50ML IVPB Inject 50 mLs (3.375 g total) into the vein every 8 (eight) hours. 04/18/23   Duayne Cal, NP  polyethylene glycol (MIRALAX / GLYCOLAX) 17 g packet Place 17 g into feeding tube daily. 04/19/23   Duayne Cal, NP  Protein (FEEDING SUPPLEMENT, PROSOURCE TF20,) liquid Place 60 mLs into feeding tube daily. 04/19/23   Duayne Cal, NP  Water For Irrigation, Sterile (FREE WATER) SOLN Place 300 mLs into feeding tube every 4 (four) hours. 04/18/23   Duayne Cal, NP     Family History  Adopted: Yes  Problem Relation Age of Onset   Diabetes Father     Social History   Socioeconomic History   Marital status: Married    Spouse name: Not on file   Number of children: 1   Years of education: Not on file   Highest education level: Not on file  Occupational History   Occupation: Magazine features editor: [employer]    Comment: [employer]  Tobacco Use   Smoking status: Former    Current packs/day: 0.00    Types: Cigarettes    Quit date: 08/22/1995    Years since quitting: 27.7   Smokeless tobacco: Never  Vaping Use   Vaping status: Former  Substance and Sexual Activity   Alcohol use: Yes    Comment: socially. 2-3 beers on  weekend.   Drug use: No   Sexual activity: Yes  Other Topics Concern   Not on file  Social History Narrative   Not on file   Social Drivers of Health   Financial Resource Strain: Low Risk  (04/18/2023)   Received from [facility]   Overall Financial Resource Strain (CARDIA)    Difficulty of Paying Living Expenses: Not hard at all  Food Insecurity: No Food Insecurity (04/18/2023)   Received from [facility]   Hunger Vital Sign    Worried About Running Out of Food in the Last Year: Never true    Ran Out of Food in the Last Year: Never true  Transportation Needs: No Transportation Needs (04/18/2023)   Received from [facility]   SM SDOH Transportation  Source    Has lack of transportation kept you from medical appointments or from getting medications?: No    Has lack of transportation kept you from meetings, work, or from getting things needed for daily living?: No  Physical Activity: Not on file  Stress: Patient Unable To Answer (04/18/2023)   Received from [facility]   [employer] of Occupational Health - Occupational Stress Questionnaire    Feeling of Stress : Patient unable to answer  Social Connections: Socially Integrated (04/18/2023)   Received from [facility]   Social Connection and Isolation Panel [NHANES]    Frequency of Communication with Friends and Family: More than three times a week    Frequency of Social Gatherings with Friends and Family: More than three times a week    Attends Religious Services: More than 4 times per year    Active Member of [employer] or Organizations: Yes    Attends Engineer, structural: More than 4 times per year    Marital Status: Married    Review of Systems: A 12 point ROS discussed and pertinent positives are indicated in the HPI above.  All other systems are negative.  Review of Systems  Vital Signs: LMP 03/25/2016   Physical Exam BP 96/56   Pulse 83   Temp 97.9 F (36.6 C) (Axillary)   Resp (!) 26   Ht  5\' 7"  (1.702 m)   Wt 209 lb 9.6 oz (95.1 kg)   SpO2 99%   PF 60 L/min   BMI 32.83 kg/m  Physical Exam Constitutional:      Appearance: She is ill-appearing.  Neck:     Comments: Tracheostomy intact Cardiovascular:     Rate and Rhythm: Normal rate and regular rhythm.     Heart sounds: Normal heart sounds.  Pulmonary:     Effort: Pulmonary effort is normal. No respiratory distress.     Comments: Tracheostomy intact Abdominal:     General: There is no distension.     Palpations: Abdomen is soft.     Tenderness: There is no abdominal tenderness.  Neurological:     Mental Status: She is disoriented.   Imaging: ECHOCARDIOGRAM COMPLETE Result Date: 05/02/2023    ECHOCARDIOGRAM REPORT   Patient Name:   RHINA KRAMME Date of Exam: 05/01/2023 Medical Rec #:  [id number]          Height:       67.0 in Accession #:    [id number]         Weight:       181.4 lb Date of Birth:  Mar 04, 1965           BSA:          1.940 m Patient Age:    57 years           BP:           97/53 mmHg Patient Gender: F                  HR:           90 bpm. Exam Location:  Inpatient Procedure: 2D Echo, 3D Echo, Cardiac Doppler, Color Doppler and Strain Analysis            (Both Spectral and Color Flow Doppler were utilized during            procedure). Indications:     Cardiac arrest  History:         Patient has prior history of  Echocardiogram examinations, most                  recent 04/03/2023. CAD; Risk Factors:Diabetes.  Sonographer:     Vern Claude Referring Phys:  [id number] Florian Buff BROWN Diagnosing Phys: Orpah Cobb MD IMPRESSIONS  1. Left ventricular ejection fraction, by estimation, is 40 to 45%. The left ventricle has mildly decreased function. The left ventricle demonstrates global hypokinesis. The left ventricular internal cavity size was mildly dilated. Left ventricular diastolic parameters are consistent with Grade I diastolic dysfunction (impaired relaxation). The global longitudinal strain is  abnormal.  2. Right ventricular systolic function is moderately reduced. The right ventricular size is moderately enlarged. There is normal pulmonary artery systolic pressure.  3. Left atrial size was moderately dilated.  4. Right atrial size was mildly dilated.  5. The mitral valve is myxomatous. Severe mitral valve regurgitation.  6. Can not exclude AV vegetation. The aortic valve is tricuspid. There is moderate calcification of the aortic valve. There is moderate thickening of the aortic valve. Aortic valve regurgitation is not visualized. Aortic valve sclerosis/calcification is  present, without any evidence of aortic stenosis.  7. The inferior vena cava is normal in size with <50% respiratory variability, suggesting right atrial pressure of 8 mmHg. Conclusion(s)/Recommendation(s): Findings consistent with ischemic cardiomyopathy and severe valvular heart disease. FINDINGS  Left Ventricle: Left ventricular ejection fraction, by estimation, is 40 to 45%. The left ventricle has mildly decreased function. The left ventricle demonstrates global hypokinesis. Strain was performed and the global longitudinal strain is abnormal. The left ventricular internal cavity size was mildly dilated. There is no left ventricular hypertrophy. Left ventricular diastolic parameters are consistent with Grade I diastolic dysfunction (impaired relaxation). Right Ventricle: The right ventricular size is moderately enlarged. No increase in right ventricular wall thickness. Right ventricular systolic function is moderately reduced. There is normal pulmonary artery systolic pressure. The tricuspid regurgitant velocity is 2.42 m/s, and with an assumed right atrial pressure of 8 mmHg, the estimated right ventricular systolic pressure is 31.4 mmHg. Left Atrium: Left atrial size was moderately dilated. Right Atrium: Right atrial size was mildly dilated. Pericardium: There is no evidence of pericardial effusion. Mitral Valve: The mitral valve is  myxomatous. Severe mitral valve regurgitation. MV peak gradient, 2.5 mmHg. The mean mitral valve gradient is 1.0 mmHg. Tricuspid Valve: The tricuspid valve is normal in structure. Tricuspid valve regurgitation is mild. Aortic Valve: Can not exclude AV vegetation. The aortic valve is tricuspid. There is moderate calcification of the aortic valve. There is moderate thickening of the aortic valve. There is moderate aortic valve annular calcification. Aortic valve regurgitation is not visualized. Aortic valve sclerosis/calcification is present, without any evidence of aortic stenosis. Aortic valve mean gradient measures 2.0 mmHg. Aortic valve peak gradient measures 3.3 mmHg. Aortic valve area, by VTI measures 2.38  cm. Pulmonic Valve: The pulmonic valve was normal in structure. Pulmonic valve regurgitation is not visualized. Aorta: The aortic root is normal in size and structure. Venous: The inferior vena cava is normal in size with less than 50% respiratory variability, suggesting right atrial pressure of 8 mmHg. IAS/Shunts: The atrial septum is grossly normal. Additional Comments: 3D was performed not requiring image post processing on an independent workstation and was abnormal.  LEFT VENTRICLE PLAX 2D LVIDd:         5.10 cm      Diastology LVIDs:         4.20 cm      LV e' medial:  4.79 cm/s LV PW:         0.70 cm      LV E/e' medial:  17.8 LV IVS:        0.80 cm      LV e' lateral:   4.35 cm/s LVOT diam:     1.80 cm      LV E/e' lateral: 19.6 LV SV:         29 LV SV Index:   15 LVOT Area:     2.54 cm  LV Volumes (MOD) LV vol d, MOD A2C: 143.0 ml LV vol d, MOD A4C: 175.0 ml LV vol s, MOD A2C: 82.0 ml LV vol s, MOD A4C: 93.4 ml LV SV MOD A2C:     61.0 ml LV SV MOD A4C:     175.0 ml LV SV MOD BP:      71.6 ml RIGHT VENTRICLE            IVC RV Basal diam:  4.70 cm    IVC diam: 1.50 cm RV S prime:     6.85 cm/s TAPSE (M-mode): 1.6 cm LEFT ATRIUM             Index        RIGHT ATRIUM           Index LA diam:         4.00 cm 2.06 cm/m   RA Area:     11.30 cm LA Vol (A2C):   32.9 ml 16.96 ml/m  RA Volume:   29.30 ml  15.10 ml/m LA Vol (A4C):   40.2 ml 20.72 ml/m LA Biplane Vol: 37.7 ml 19.43 ml/m  AORTIC VALVE                    PULMONIC VALVE AV Area (Vmax):    2.33 cm     PV Vmax:       0.59 m/s AV Area (Vmean):   2.06 cm     PV Peak grad:  1.4 mmHg AV Area (VTI):     2.38 cm AV Vmax:           90.60 cm/s AV Vmean:          57.000 cm/s AV VTI:            0.120 m AV Peak Grad:      3.3 mmHg AV Mean Grad:      2.0 mmHg LVOT Vmax:         82.80 cm/s LVOT Vmean:        46.100 cm/s LVOT VTI:          0.112 m LVOT/AV VTI ratio: 0.93  AORTA Ao Root diam: 2.90 cm Ao Asc diam:  2.80 cm MITRAL VALVE               TRICUSPID VALVE MV Area (PHT): 5.13 cm    TR Peak grad:   23.4 mmHg MV Area VTI:   2.03 cm    TR Vmax:        242.00 cm/s MV Peak grad:  2.5 mmHg MV Mean grad:  1.0 mmHg    SHUNTS MV Vmax:       0.78 m/s    Systemic VTI:  0.11 m MV Vmean:      56.4 cm/s   Systemic Diam: 1.80 cm MV Decel Time: 148 msec MV E velocity: 85.10 cm/s MV A velocity: 91.10 cm/s MV E/A ratio:  0.93 Orpah Cobb MD Electronically  signed by Orpah Cobb MD Signature Date/Time: 05/02/2023/11:00:56 AM    Final    DG Chest Port 1 View Result Date: 04/28/2023 CLINICAL DATA:  Increased tracheal secretions. EXAM: PORTABLE CHEST 1 VIEW COMPARISON:  Same day. FINDINGS: Stable cardiomediastinal silhouette. Tracheostomy and feeding tubes are unchanged. Stable bilateral lung opacities are noted concerning for multifocal pneumonia or possibly edema. Small pleural effusions may be present. Bony thorax is unremarkable. IMPRESSION: Stable support apparatus. Stable bilateral lung opacities as noted above. Electronically Signed   By: Lupita Raider M.D.   On: 04/28/2023 15:44   DG CHEST PORT 1 VIEW Result Date: 04/28/2023 CLINICAL DATA:  Excessive oral secretions. EXAM: PORTABLE CHEST 1 VIEW COMPARISON:  04/24/2023 FINDINGS: The cardio pericardial silhouette  is enlarged. Diffuse bilateral airspace disease is compatible with pneumonia or edema. Small pleural effusions again noted. Tracheostomy tube again noted. Feeding tube tip is in the gastric fundus. Telemetry leads overlie the chest. IMPRESSION: 1. Diffuse bilateral airspace disease compatible with pneumonia or edema. 2. Small pleural effusions. Electronically Signed   By: Kennith Center M.D.   On: 04/28/2023 08:29   CT ABDOMEN WO CONTRAST Result Date: 04/28/2023 CLINICAL DATA:  Evaluate anatomy for gastrostomy tube placement - send to IR MD to read. EXAM: CT ABDOMEN WITHOUT CONTRAST TECHNIQUE: Multidetector CT imaging of the abdomen was performed following the standard protocol without IV contrast. RADIATION DOSE REDUCTION: This exam was performed according to the departmental dose-optimization program which includes automated exposure control, adjustment of the mA and/or kV according to patient size and/or use of iterative reconstruction technique. COMPARISON:  KUB, 04/24/2023.  CT CHEST, 04/22/2018. FINDINGS: Lower chest: Pleural effusions and pulmonary opacities of the imaged lung bases. Hepatobiliary: Normal noncontrast appearance of the liver without focal abnormality. Layering within a nondistended gallbladder, likely sludge. No gallstones, gallbladder wall thickening, or biliary dilatation. Pancreas: No pancreatic ductal dilatation or surrounding inflammatory changes. Spleen: Relatively small in size without focal abnormality. Adrenals/Urinary Tract: Adrenal glands are unremarkable. Kidneys are normal, without renal calculi, focal lesion, or hydronephrosis. Stomach/Bowel: Small bore enteric feeding tube, with tip recurved into the cardia. Stomach is mildly gaseous distended and otherwise within normal limits. Imaged small bowel and colon are nondilated. Appendix is outside the field of view. No evidence of bowel wall thickening, distention, or inflammatory changes. Vascular/Lymphatic: Aortic atherosclerosis.  No enlarged abdominal lymph nodes. Other: No abdominal wall hernia or ascites. Musculoskeletal: No acute osseous findings. IMPRESSION: 1. No acute abdominal process. 2. No intervening viscera. Anatomy is amenable for percutaneous gastrostomy placement. 3. Gastric positioning of small bore enteric feeding tube. Consider advancement if transpyloric positioning is desired. 4. BILATERAL pleural effusions and Aortic Atherosclerosis (ICD10-I70.0). Additional incidental, chronic and senescent findings as above. Roanna Banning, MD [facility] Electronically Signed   By: Roanna Banning M.D.   On: 04/28/2023 07:01   DG Abd Portable 1V Result Date: 04/24/2023 CLINICAL DATA:  Enteric tube placement. EXAM: PORTABLE ABDOMEN - 1 VIEW COMPARISON:  Abdominal x-ray dated April 22, 2023. FINDINGS: Interval feeding tube exchange with weighted tube tip now in the gastric fundus. Normal visualized bowel gas pattern. IMPRESSION: 1. Feeding tube tip in the gastric fundus. Electronically Signed   By: Obie Dredge M.D.   On: 04/24/2023 13:25   DG Chest Port 1 View Result Date: 04/24/2023 CLINICAL DATA:  Status post thoracentesis. EXAM: PORTABLE CHEST 1 VIEW COMPARISON:  CT chest dated April 22, 2023. Chest x-ray dated April 18, 2023. FINDINGS: Interval removal of  the feeding tube. Unchanged tracheostomy tube. Stable cardiomediastinal silhouette. Patchy interstitial thickening and hazy airspace opacities in both lungs are not significantly changed. Layering small bilateral pleural effusions. No pneumothorax. No acute osseous abnormality. IMPRESSION: 1. No pneumothorax. 2. Unchanged bilateral airspace disease, pulmonary edema, and small bilateral pleural effusions. Electronically Signed   By: Obie Dredge M.D.   On: 04/24/2023 13:24   IR THORACENTESIS ASP PLEURAL SPACE W/IMG GUIDE Result Date: 04/24/2023 INDICATION: 58 year old female with respiratory failure status post  tracheostomy, CT chest showed right pleural effusion. Request for therapeutic thoracentesis. EXAM: ULTRASOUND GUIDED RIGHT THORACENTESIS MEDICATIONS: 8 mL 1% lidocaine COMPLICATIONS: None immediate. PROCEDURE: An ultrasound guided thoracentesis was thoroughly discussed with the patient's family members and questions answered. The benefits, risks, alternatives and complications were also discussed. The family members understand and wishes to proceed with the procedure. Written consent was obtained. Ultrasound was performed to localize and mark an adequate pocket of fluid in the right chest. The area was then prepped and draped in the normal sterile fashion. 1% Lidocaine was used for local anesthesia. Under ultrasound guidance a 6 Fr Safe-T-Centesis catheter was introduced. Thoracentesis was performed. The catheter was removed and a dressing applied. FINDINGS: A total of approximately 600 mL of hazy yellow fluid was removed. Post procedure chest X-ray reviewed, negative for pneumothorax. IMPRESSION: Successful ultrasound guided right thoracentesis yielding 600 mL of pleural fluid. Performed by: Lawernce Ion, PA-C Electronically Signed   By: Judie Petit.  Shick M.D.   On: 04/24/2023 13:00   CT CHEST WO CONTRAST Result Date: 04/22/2023 CLINICAL DATA:  Follow-up bilateral airspace opacities, initial encounter EXAM: CT CHEST WITHOUT CONTRAST TECHNIQUE: Multidetector CT imaging of the chest was performed following the standard protocol without IV contrast. RADIATION DOSE REDUCTION: This exam was performed according to the departmental dose-optimization program which includes automated exposure control, adjustment of the mA and/or kV according to patient size and/or use of iterative reconstruction technique. COMPARISON:  04/18/2023 FINDINGS: Cardiovascular: Somewhat limited due to lack of IV contrast. Atherosclerotic calcifications are seen. Heavy coronary calcifications are the artery is not significantly enlarged in size. Pulmonary  artery is not significantly enlarged. Mediastinum/Nodes: Thoracic inlet is within normal limits. Tracheostomy tube and feeding catheter are noted in satisfactory position. No hilar or mediastinal adenopathy is noted. The esophagus as visualized is within normal limits. Lungs/Pleura: Bilateral pleural effusions are noted of a moderate to large size right slightly greater than left. Patchy airspace opacities are noted throughout both lungs in both the upper and lower lobes consistent with multifocal pneumonia. These findings are similar to that seen on the prior plain film examination. Again there slightly worse on the right than the left. Upper Abdomen: Visualized upper abdomen is unremarkable. Musculoskeletal: Degenerative changes of the thoracic spine are noted. No acute rib abnormality is seen. IMPRESSION: Stable patchy airspace opacities right greater than left with associated moderate to large pleural effusions. Tubes and lines as described. Aortic Atherosclerosis (ICD10-I70.0). Electronically Signed   By: Alcide Clever M.D.   On: 04/22/2023 19:13   DG Abd 1 View Result Date: 04/22/2023 CLINICAL DATA:  Enteric tube placement. EXAM: ABDOMEN - 1 VIEW COMPARISON:  Abdominal radiograph dated 04/18/2023. FINDINGS: An enteric tube terminates in the stomach near the antrum. IMPRESSION: Enteric tube terminates in the stomach. Electronically Signed   By: Romona Curls M.D.   On: 04/22/2023 09:19   DG Abd Portable 1V Result Date: 04/18/2023 CLINICAL DATA:  Impaired nasogastric feeding tube EXAM: PORTABLE ABDOMEN - 1 VIEW COMPARISON:  04/18/2023  FINDINGS: Frontal view of the lower chest and upper abdomen demonstrates enteric catheter passing below diaphragm, tip projecting over the gastric antrum. Bowel gas pattern is unremarkable. Bilateral airspace disease noted at the lung bases. IMPRESSION: 1. Enteric catheter tip projecting over the gastric antrum. Electronically Signed   By: Sharlet Salina M.D.   On: 04/18/2023  21:39   DG Abd 1 View Result Date: 04/18/2023 CLINICAL DATA:  Tube placement EXAM: ABDOMEN - 1 VIEW portable supine limited for tube placement COMPARISON:  X-ray 04/02/2023. FINDINGS: Feeding tube with tip overlying the lower mediastinum, likely distal esophagus. This could be advanced further several cm to reach the stomach and duodenum. Along the visualized portions of the abdomen there is gas seen in nondilated loops of bowel. Degenerative changes of the spine. Overlapping cardiac leads. IMPRESSION: Limited x-ray for tube placement has a feeding tube with tip overlying the lower mediastinum, presumed distal esophagus. This could be advanced further several cm to reach the stomach Electronically Signed   By: Karen Kays M.D.   On: 04/18/2023 18:26   DG CHEST PORT 1 VIEW Result Date: 04/18/2023 CLINICAL DATA:  Pneumonia EXAM: PORTABLE CHEST 1 VIEW COMPARISON:  Chest x-ray 04/14/2023 FINDINGS: The tip of the tracheostomy is 2 cm above the carina. Enteric tube tip is now at the level of the distal esophagus. The cardiomediastinal silhouette is within normal limits. There are increasing bilateral central multifocal airspace opacities. There is no pleural effusion or pneumothorax. No acute fractures are seen. IMPRESSION: 1. Enteric tube tip is now at the level of the distal esophagus. Recommend advancement. 2. Increasing bilateral central multifocal airspace opacities. Electronically Signed   By: Darliss Cheney M.D.   On: 04/18/2023 18:23   DG Chest Port 1 View Result Date: 04/14/2023 CLINICAL DATA:  Respiratory dependence, cardiac arrest. EXAM: PORTABLE CHEST 1 VIEW COMPARISON:  April 11, 2023. FINDINGS: Stable cardiomediastinal silhouette. Tracheostomy and feeding tubes are in grossly good position. Both lungs are clear. The visualized skeletal structures are unremarkable. IMPRESSION: No active disease. Electronically Signed   By: Lupita Raider M.D.   On: 04/14/2023 12:47   DG Chest Port 1  View Result Date: 04/11/2023 CLINICAL DATA:  Status post tracheostomy placement.  Ex-smoker. EXAM: PORTABLE CHEST 1 VIEW COMPARISON:  04/11/2023 FINDINGS: Interval tracheostomy tube in satisfactory position. Poor inspiration, worse than earlier today. Mild increase in patchy bilateral airspace opacity, accentuated by the decreased inspiration. Thoracic spine degenerative changes. Left jugular catheter tip in the superior vena cava. The previous nasogastric tube has been removed. IMPRESSION: 1. Interval tracheostomy tube in satisfactory position. 2. Mild increase in patchy bilateral airspace opacity, accentuated by the decreased inspiration. This is suspicious for bilateral multifocal infection. Electronically Signed   By: Beckie Salts M.D.   On: 04/11/2023 15:20   DG Chest Port 1 View Result Date: 04/11/2023 CLINICAL DATA:  Respirator dependent. EXAM: PORTABLE CHEST 1 VIEW COMPARISON:  April 07, 2023. FINDINGS: Stable cardiomediastinal silhouette. Endotracheal and nasogastric tubes are unchanged. Left internal jugular catheter is unchanged. Bibasilar atelectasis or edema is noted. Bony thorax is unremarkable. IMPRESSION: Stable support apparatus. Stable mild bibasilar subsegmental atelectasis or edema. Electronically Signed   By: Lupita Raider M.D.   On: 04/11/2023 10:05   MR BRAIN W WO CONTRAST Result Date: 04/09/2023 CLINICAL DATA:  Anoxic brain damage EXAM: MRI HEAD WITHOUT AND WITH CONTRAST TECHNIQUE: Multiplanar, multiecho pulse sequences of the brain and surrounding structures were obtained without and with intravenous contrast. CONTRAST:  9mL GADAVIST  GADOBUTROL 1 MMOL/ML IV SOLN COMPARISON:  MRI February 13, 25. FINDINGS: Brain: No acute infarction, hemorrhage, hydrocephalus, extra-axial collection or mass lesion. No pathologic enhancement. Vascular: Major arterial flow voids are maintained at the skull base. Skull and upper cervical spine: Normal marrow signal. Sinuses/Orbits: Clear.  No acute  orbital findings. Other: No mastoid effusions. IMPRESSION: Previously seen subtle restricted diffusion has resolved. No evidence of acute intracranial abnormality. Electronically Signed   By: Feliberto Harts M.D.   On: 04/09/2023 15:57   DG CHEST PORT 1 VIEW Result Date: 04/07/2023 CLINICAL DATA:  ARDS. EXAM: PORTABLE CHEST 1 VIEW COMPARISON:  Chest x-ray from yesterday. FINDINGS: Unchanged endotracheal and enteric tubes. Unchanged left internal jugular central venous catheter. Stable cardiomediastinal silhouette. Bilateral parahilar airspace opacities are improving. Mildly improved aeration at the left lung base. No pneumothorax or large pleural effusion. No acute osseous abnormality. IMPRESSION: 1. Improving pulmonary edema. Electronically Signed   By: Obie Dredge M.D.   On: 04/07/2023 09:42   DG CHEST PORT 1 VIEW Result Date: 04/06/2023 CLINICAL DATA:  Hypoxia. EXAM: PORTABLE CHEST 1 VIEW COMPARISON:  Radiograph yesterday FINDINGS: Endotracheal tube tip 2.5 cm from the carina. Enteric tube tip below the diaphragm. Left internal jugular central line unchanged in position. Persistent low lung volumes. Stable cardiomegaly. Worsening heterogeneous bilateral perihilar opacity. Worsening left lung base opacity and probable pleural effusion. No pneumothorax. IMPRESSION: 1. Worsening bilateral perihilar opacities suspicious for pulmonary edema. 2. Increasing hazy left lung base opacity likely pleural effusion and airspace disease/atelectasis. Electronically Signed   By: Narda Rutherford M.D.   On: 04/06/2023 10:22   MR BRAIN WO CONTRAST Result Date: 04/06/2023 CLINICAL DATA:  Anoxic brain damage.  Post cardiac arrest. EXAM: MRI HEAD WITHOUT CONTRAST TECHNIQUE: Multiplanar, multiecho pulse sequences of the brain and surrounding structures were obtained without intravenous contrast. COMPARISON:  CT head April 02, 2023. FINDINGS: Brain: Subtle/equivocal restricted diffusion involving the high parasagittal  frontal lobes and potentially the deep gray nuclei. No evidence of acute hemorrhage, mass lesion, midline shift or hydrocephalus. Vascular: Major arterial flow voids are maintained at the skull base. Skull and upper cervical spine: Normal marrow signal. Sinuses/Orbits: Mostly clear sinuses.  No acute orbital findings. Other: No mastoid effusions. IMPRESSION: 1. Subtle/equivocal restricted diffusion involving the high parasagittal frontal lobes and potentially the deep gray nuclei. This could represent a hypoxic ischemic injury (given the clinical history) or artifact. A follow-up MRI could assess for change/persistence if clinically warranted. 2. No mass effect or other superimposed acute abnormality. Electronically Signed   By: Feliberto Harts M.D.   On: 04/06/2023 03:01   DG CHEST PORT 1 VIEW Result Date: 04/05/2023 CLINICAL DATA:  Hypoxia EXAM: PORTABLE CHEST 1 VIEW COMPARISON:  04/03/2023 FINDINGS: Cardiac shadow is stable. Endotracheal tube and gastric catheter are noted in satisfactory position. The overall inspiratory effort is again poor with crowding of the vascular markings. No focal confluent infiltrate is seen. IMPRESSION: No change from the previous exam. Electronically Signed   By: Alcide Clever M.D.   On: 04/05/2023 09:49    Labs:  CBC: Recent Labs    04/17/23 1153 04/18/23 1130 04/19/23 0428 04/28/23 0400 05/03/23 0350  WBC 11.8*  --  10.3 9.4 7.4  HGB 8.9* 8.5* 8.2* 8.4* 7.5*  HCT 28.0* 25.0* 25.0* 27.0* 24.2*  PLT 453*  --  515* 399 286    COAGS: Recent Labs    05/03/23 0350  INR 1.3*    BMP: Recent Labs    04/30/23 0450  05/01/23 0402 05/02/23 0429 05/03/23 0350  NA 137 136 135 138  K 4.1 4.0 4.3 4.4  CL 105 109 107 107  CO2 21* 20* 20* 20*  GLUCOSE 155* 163* 295* 117*  BUN 59* 48* 40* 38*  CALCIUM 8.6* 8.0* 8.1* 8.7*  CREATININE 1.72* 1.21* 1.06* 1.14*  GFRNONAA 35* 53* >60 57*    LIVER FUNCTION TESTS: Recent Labs    04/07/23 0359 04/09/23 0512  04/17/23 1153 04/19/23 0428  BILITOT 1.1 0.5 0.5 0.5  AST 83* 41 41 46*  ALT 78* 48* 59* 54*  ALKPHOS 293* 248* 201* 185*  PROT 6.4* 5.9* 5.9* 5.7*  ALBUMIN 2.8* 2.2* 2.2* 2.2*     Assessment and Plan: Dysphagia/FTT For perc G-tube. Plan for tomorrow. Can resume TF today and stop at [place]. Labs reviewed. ASA has been held. Risks and benefits image guided gastrostomy tube placement was discussed with the patient's husband including, but not limited to the need for a barium enema during the procedure, bleeding, infection, peritonitis and/or damage to adjacent structures.  All questions were answered, patient's husband is agreeable to proceed.  Consent signed and in chart.    Electronically Signed: Brayton El, PA-C 05/03/2023, 1:30 PM   I spent a total of 20 minutes in face to face in clinical consultation, greater than 50% of which was counseling/coordinating care for G-tube

## 2023-05-04 ENCOUNTER — Other Ambulatory Visit (HOSPITAL_COMMUNITY): Payer: Self-pay

## 2023-05-04 DIAGNOSIS — J9621 Acute and chronic respiratory failure with hypoxia: Secondary | ICD-10-CM | POA: Diagnosis not present

## 2023-05-04 DIAGNOSIS — J159 Unspecified bacterial pneumonia: Secondary | ICD-10-CM

## 2023-05-04 DIAGNOSIS — Y95 Nosocomial condition: Secondary | ICD-10-CM

## 2023-05-04 DIAGNOSIS — I462 Cardiac arrest due to underlying cardiac condition: Secondary | ICD-10-CM | POA: Diagnosis not present

## 2023-05-04 DIAGNOSIS — Z930 Tracheostomy status: Secondary | ICD-10-CM | POA: Diagnosis not present

## 2023-05-04 DIAGNOSIS — G931 Anoxic brain damage, not elsewhere classified: Secondary | ICD-10-CM | POA: Diagnosis not present

## 2023-05-04 LAB — COMPREHENSIVE METABOLIC PANEL
ALT: 72 U/L — ABNORMAL HIGH (ref 0–44)
AST: 63 U/L — ABNORMAL HIGH (ref 15–41)
Albumin: 2.3 g/dL — ABNORMAL LOW (ref 3.5–5.0)
Alkaline Phosphatase: 375 U/L — ABNORMAL HIGH (ref 38–126)
Anion gap: 16 — ABNORMAL HIGH (ref 5–15)
BUN: 52 mg/dL — ABNORMAL HIGH (ref 6–20)
CO2: 16 mmol/L — ABNORMAL LOW (ref 22–32)
Calcium: 8.6 mg/dL — ABNORMAL LOW (ref 8.9–10.3)
Chloride: 107 mmol/L (ref 98–111)
Creatinine, Ser: 2.07 mg/dL — ABNORMAL HIGH (ref 0.44–1.00)
GFR, Estimated: 28 mL/min — ABNORMAL LOW (ref >60)
Glucose, Bld: 141 mg/dL — ABNORMAL HIGH (ref 70–99)
Potassium: 4.6 mmol/L (ref 3.5–5.1)
Sodium: 139 mmol/L (ref 135–145)
Total Bilirubin: 0.8 mg/dL (ref 0.0–1.2)
Total Protein: 5.9 g/dL — ABNORMAL LOW (ref 6.5–8.1)

## 2023-05-04 LAB — CBC
HCT: 25.5 % — ABNORMAL LOW (ref 36.0–46.0)
Hemoglobin: 7.8 g/dL — ABNORMAL LOW (ref 12.0–15.0)
MCH: 25.9 pg — ABNORMAL LOW (ref 26.0–34.0)
MCHC: 30.6 g/dL (ref 30.0–36.0)
MCV: 84.7 fL (ref 80.0–100.0)
Platelets: 262 10*3/uL (ref 150–400)
RBC: 3.01 MIL/uL — ABNORMAL LOW (ref 3.87–5.11)
RDW: 16.4 % — ABNORMAL HIGH (ref 11.5–15.5)
WBC: 7.5 10*3/uL (ref 4.0–10.5)
nRBC: 3.8 % — ABNORMAL HIGH (ref 0.0–0.2)

## 2023-05-04 LAB — MAGNESIUM: Magnesium: 2.5 mg/dL — ABNORMAL HIGH (ref 1.7–2.4)

## 2023-05-04 LAB — TROPONIN I (HIGH SENSITIVITY): Troponin I (High Sensitivity): 540 ng/L (ref <18)

## 2023-05-04 MED ORDER — IOHEXOL 300 MG/ML  SOLN
50.0000 mL | Freq: Once | INTRAMUSCULAR | Status: AC | PRN
Start: 2023-05-04 — End: 2023-05-04
  Administered 2023-05-04: 15 mL

## 2023-05-04 MED ORDER — MIDAZOLAM HCL 2 MG/2ML IJ SOLN
INTRAMUSCULAR | Status: AC | PRN
  Administered 2023-05-04: .5 mg via INTRAVENOUS

## 2023-05-04 MED ORDER — FENTANYL CITRATE (PF) 100 MCG/2ML IJ SOLN
INTRAMUSCULAR | Status: AC | PRN
  Administered 2023-05-04: 25 ug via INTRAVENOUS

## 2023-05-04 MED ORDER — CEFAZOLIN SODIUM-DEXTROSE 1-4 GM/50ML-% IV SOLN
INTRAVENOUS | Status: AC | PRN
  Administered 2023-05-04: 2 g via INTRAVENOUS

## 2023-05-04 MED ORDER — SODIUM CHLORIDE 0.9 % IV SOLN
INTRAVENOUS | Status: AC | PRN
  Administered 2023-05-04: 10 mL/h via INTRAVENOUS

## 2023-05-04 NOTE — Procedures (Signed)
 Interventional Radiology Procedure Note  Procedure: Placement of percutaneous 3F pull-through gastrostomy tube. Complications: None Recommendations: - OK to use in 4 hours when needed - Routine wound care - May remove the dobbhoff as needed - consider binder if needed  Signed,  Yvone Neu. Loreta Ave, DO, ABVM, RPVI

## 2023-05-04 NOTE — Sedation Documentation (Signed)
 Dr Loreta Ave aware of decreased BP, NS started wide open

## 2023-05-05 ENCOUNTER — Ambulatory Visit (HOSPITAL_COMMUNITY)
Admission: RE | Admit: 2023-05-05 | Discharge: 2023-05-05 | Disposition: A | Discharge status: Home / Self Care | Payer: Managed Care, Other (non HMO) | Source: Ambulatory Visit | Attending: Cardiology | Admitting: Cardiology

## 2023-05-05 DIAGNOSIS — J9621 Acute and chronic respiratory failure with hypoxia: Secondary | ICD-10-CM | POA: Diagnosis not present

## 2023-05-05 DIAGNOSIS — G931 Anoxic brain damage, not elsewhere classified: Secondary | ICD-10-CM | POA: Diagnosis not present

## 2023-05-05 DIAGNOSIS — Y95 Nosocomial condition: Secondary | ICD-10-CM

## 2023-05-05 DIAGNOSIS — I462 Cardiac arrest due to underlying cardiac condition: Secondary | ICD-10-CM | POA: Diagnosis not present

## 2023-05-05 DIAGNOSIS — J159 Unspecified bacterial pneumonia: Secondary | ICD-10-CM

## 2023-05-05 DIAGNOSIS — Z930 Tracheostomy status: Secondary | ICD-10-CM | POA: Diagnosis not present

## 2023-05-05 LAB — BASIC METABOLIC PANEL
Anion gap: 12 (ref 5–15)
BUN: 52 mg/dL — ABNORMAL HIGH (ref 6–20)
CO2: 18 mmol/L — ABNORMAL LOW (ref 22–32)
Calcium: 8.6 mg/dL — ABNORMAL LOW (ref 8.9–10.3)
Chloride: 108 mmol/L (ref 98–111)
Creatinine, Ser: 1.73 mg/dL — ABNORMAL HIGH (ref 0.44–1.00)
GFR, Estimated: 34 mL/min — ABNORMAL LOW (ref >60)
Glucose, Bld: 112 mg/dL — ABNORMAL HIGH (ref 70–99)
Potassium: 4.5 mmol/L (ref 3.5–5.1)
Sodium: 138 mmol/L (ref 135–145)

## 2023-05-05 LAB — TROPONIN I (HIGH SENSITIVITY)
Troponin I (High Sensitivity): 763 ng/L (ref <18)
Troponin I (High Sensitivity): 781 ng/L (ref <18)
Troponin I (High Sensitivity): 743 ng/L (ref <18)

## 2023-05-06 DIAGNOSIS — Z930 Tracheostomy status: Secondary | ICD-10-CM | POA: Diagnosis not present

## 2023-05-06 DIAGNOSIS — I462 Cardiac arrest due to underlying cardiac condition: Secondary | ICD-10-CM | POA: Diagnosis not present

## 2023-05-06 DIAGNOSIS — J9621 Acute and chronic respiratory failure with hypoxia: Secondary | ICD-10-CM | POA: Diagnosis not present

## 2023-05-06 DIAGNOSIS — Y95 Nosocomial condition: Secondary | ICD-10-CM

## 2023-05-06 DIAGNOSIS — J159 Unspecified bacterial pneumonia: Secondary | ICD-10-CM

## 2023-05-06 DIAGNOSIS — G931 Anoxic brain damage, not elsewhere classified: Secondary | ICD-10-CM | POA: Diagnosis not present

## 2023-05-07 DIAGNOSIS — J9621 Acute and chronic respiratory failure with hypoxia: Secondary | ICD-10-CM | POA: Diagnosis not present

## 2023-05-07 DIAGNOSIS — G931 Anoxic brain damage, not elsewhere classified: Secondary | ICD-10-CM | POA: Diagnosis not present

## 2023-05-07 DIAGNOSIS — I462 Cardiac arrest due to underlying cardiac condition: Secondary | ICD-10-CM | POA: Diagnosis not present

## 2023-05-07 DIAGNOSIS — Z930 Tracheostomy status: Secondary | ICD-10-CM | POA: Diagnosis not present

## 2023-05-07 DIAGNOSIS — J159 Unspecified bacterial pneumonia: Secondary | ICD-10-CM

## 2023-05-07 DIAGNOSIS — Y95 Nosocomial condition: Secondary | ICD-10-CM

## 2023-05-08 DIAGNOSIS — I462 Cardiac arrest due to underlying cardiac condition: Secondary | ICD-10-CM | POA: Diagnosis not present

## 2023-05-08 DIAGNOSIS — J9621 Acute and chronic respiratory failure with hypoxia: Secondary | ICD-10-CM | POA: Diagnosis not present

## 2023-05-08 DIAGNOSIS — J159 Unspecified bacterial pneumonia: Secondary | ICD-10-CM

## 2023-05-08 DIAGNOSIS — Y95 Nosocomial condition: Secondary | ICD-10-CM

## 2023-05-08 DIAGNOSIS — Z930 Tracheostomy status: Secondary | ICD-10-CM | POA: Diagnosis not present

## 2023-05-08 DIAGNOSIS — G931 Anoxic brain damage, not elsewhere classified: Secondary | ICD-10-CM | POA: Diagnosis not present

## 2023-05-08 LAB — CBC
HCT: 22.7 % — ABNORMAL LOW (ref 36.0–46.0)
HCT: 23.4 % — ABNORMAL LOW (ref 36.0–46.0)
Hemoglobin: 7 g/dL — ABNORMAL LOW (ref 12.0–15.0)
Hemoglobin: 7.2 g/dL — ABNORMAL LOW (ref 12.0–15.0)
MCH: 26.1 pg (ref 26.0–34.0)
MCH: 26 pg (ref 26.0–34.0)
MCHC: 30.8 g/dL (ref 30.0–36.0)
MCHC: 30.8 g/dL (ref 30.0–36.0)
MCV: 84.7 fL (ref 80.0–100.0)
MCV: 84.5 fL (ref 80.0–100.0)
Platelets: 206 10*3/uL (ref 150–400)
Platelets: 209 10*3/uL (ref 150–400)
RBC: 2.68 MIL/uL — ABNORMAL LOW (ref 3.87–5.11)
RBC: 2.77 MIL/uL — ABNORMAL LOW (ref 3.87–5.11)
RDW: 17.3 % — ABNORMAL HIGH (ref 11.5–15.5)
RDW: 17.2 % — ABNORMAL HIGH (ref 11.5–15.5)
WBC: 7.6 10*3/uL (ref 4.0–10.5)
WBC: 8.4 10*3/uL (ref 4.0–10.5)
nRBC: 0.8 % — ABNORMAL HIGH (ref 0.0–0.2)
nRBC: 0.6 % — ABNORMAL HIGH (ref 0.0–0.2)

## 2023-05-08 LAB — BASIC METABOLIC PANEL
Anion gap: 8 (ref 5–15)
BUN: 28 mg/dL — ABNORMAL HIGH (ref 6–20)
CO2: 23 mmol/L (ref 22–32)
Calcium: 8.4 mg/dL — ABNORMAL LOW (ref 8.9–10.3)
Chloride: 109 mmol/L (ref 98–111)
Creatinine, Ser: 0.93 mg/dL (ref 0.44–1.00)
GFR, Estimated: >60 mL/min (ref >60)
Glucose, Bld: 107 mg/dL — ABNORMAL HIGH (ref 70–99)
Potassium: 4.8 mmol/L (ref 3.5–5.1)
Sodium: 140 mmol/L (ref 135–145)

## 2023-05-08 LAB — COMPREHENSIVE METABOLIC PANEL
ALT: 34 U/L (ref 0–44)
AST: 26 U/L (ref 15–41)
Albumin: 1.9 g/dL — ABNORMAL LOW (ref 3.5–5.0)
Alkaline Phosphatase: 282 U/L — ABNORMAL HIGH (ref 38–126)
Anion gap: 6 (ref 5–15)
BUN: 29 mg/dL — ABNORMAL HIGH (ref 6–20)
CO2: 24 mmol/L (ref 22–32)
Calcium: 8.3 mg/dL — ABNORMAL LOW (ref 8.9–10.3)
Chloride: 108 mmol/L (ref 98–111)
Creatinine, Ser: 1.05 mg/dL — ABNORMAL HIGH (ref 0.44–1.00)
GFR, Estimated: >60 mL/min (ref >60)
Glucose, Bld: 157 mg/dL — ABNORMAL HIGH (ref 70–99)
Potassium: 4.9 mmol/L (ref 3.5–5.1)
Sodium: 138 mmol/L (ref 135–145)
Total Bilirubin: 0.7 mg/dL (ref 0.0–1.2)
Total Protein: 5.6 g/dL — ABNORMAL LOW (ref 6.5–8.1)

## 2023-05-09 DIAGNOSIS — J9621 Acute and chronic respiratory failure with hypoxia: Secondary | ICD-10-CM | POA: Diagnosis not present

## 2023-05-09 DIAGNOSIS — J159 Unspecified bacterial pneumonia: Secondary | ICD-10-CM

## 2023-05-09 DIAGNOSIS — Y95 Nosocomial condition: Secondary | ICD-10-CM

## 2023-05-09 DIAGNOSIS — G931 Anoxic brain damage, not elsewhere classified: Secondary | ICD-10-CM | POA: Diagnosis not present

## 2023-05-09 DIAGNOSIS — Z930 Tracheostomy status: Secondary | ICD-10-CM | POA: Diagnosis not present

## 2023-05-09 DIAGNOSIS — I462 Cardiac arrest due to underlying cardiac condition: Secondary | ICD-10-CM | POA: Diagnosis not present

## 2023-05-09 LAB — CBC
HCT: 29.9 % — ABNORMAL LOW (ref 36.0–46.0)
Hemoglobin: 9.4 g/dL — ABNORMAL LOW (ref 12.0–15.0)
MCH: 26.2 pg (ref 26.0–34.0)
MCHC: 31.4 g/dL (ref 30.0–36.0)
MCV: 83.3 fL (ref 80.0–100.0)
Platelets: 232 10*3/uL (ref 150–400)
RBC: 3.59 MIL/uL — ABNORMAL LOW (ref 3.87–5.11)
RDW: 17.2 % — ABNORMAL HIGH (ref 11.5–15.5)
WBC: 10.3 10*3/uL (ref 4.0–10.5)
nRBC: 0.8 % — ABNORMAL HIGH (ref 0.0–0.2)

## 2023-05-09 LAB — HEMOGLOBIN AND HEMATOCRIT, BLOOD
HCT: 22.8 % — ABNORMAL LOW (ref 36.0–46.0)
Hemoglobin: 7 g/dL — ABNORMAL LOW (ref 12.0–15.0)

## 2023-05-09 LAB — PREPARE RBC (CROSSMATCH)

## 2023-05-10 ENCOUNTER — Other Ambulatory Visit (HOSPITAL_COMMUNITY): Payer: Self-pay

## 2023-05-10 DIAGNOSIS — Z930 Tracheostomy status: Secondary | ICD-10-CM | POA: Diagnosis not present

## 2023-05-10 DIAGNOSIS — I462 Cardiac arrest due to underlying cardiac condition: Secondary | ICD-10-CM | POA: Diagnosis not present

## 2023-05-10 DIAGNOSIS — J159 Unspecified bacterial pneumonia: Secondary | ICD-10-CM

## 2023-05-10 DIAGNOSIS — J9621 Acute and chronic respiratory failure with hypoxia: Secondary | ICD-10-CM | POA: Diagnosis not present

## 2023-05-10 DIAGNOSIS — G931 Anoxic brain damage, not elsewhere classified: Secondary | ICD-10-CM | POA: Diagnosis not present

## 2023-05-10 DIAGNOSIS — Y95 Nosocomial condition: Secondary | ICD-10-CM

## 2023-05-10 LAB — TYPE AND SCREEN
ABO/RH(D): O POS
Antibody Screen: NEGATIVE
Unit division: 0

## 2023-05-10 LAB — BPAM RBC
Blood Product Expiration Date: 202504152359
ISSUE DATE / TIME: 202503181413
Unit Type and Rh: 5100

## 2023-05-10 LAB — BASIC METABOLIC PANEL
Anion gap: 12 (ref 5–15)
BUN: 29 mg/dL — ABNORMAL HIGH (ref 6–20)
CO2: 21 mmol/L — ABNORMAL LOW (ref 22–32)
Calcium: 8.7 mg/dL — ABNORMAL LOW (ref 8.9–10.3)
Chloride: 106 mmol/L (ref 98–111)
Creatinine, Ser: 0.89 mg/dL (ref 0.44–1.00)
GFR, Estimated: >60 mL/min (ref >60)
Glucose, Bld: 126 mg/dL — ABNORMAL HIGH (ref 70–99)
Potassium: 4.9 mmol/L (ref 3.5–5.1)
Sodium: 139 mmol/L (ref 135–145)

## 2023-05-11 DIAGNOSIS — G931 Anoxic brain damage, not elsewhere classified: Secondary | ICD-10-CM

## 2023-05-11 DIAGNOSIS — J9621 Acute and chronic respiratory failure with hypoxia: Secondary | ICD-10-CM

## 2023-05-11 DIAGNOSIS — I462 Cardiac arrest due to underlying cardiac condition: Secondary | ICD-10-CM

## 2023-05-11 DIAGNOSIS — Z930 Tracheostomy status: Secondary | ICD-10-CM

## 2023-05-11 DIAGNOSIS — J159 Unspecified bacterial pneumonia: Secondary | ICD-10-CM

## 2023-05-11 DIAGNOSIS — Y95 Nosocomial condition: Secondary | ICD-10-CM

## 2023-05-12 ENCOUNTER — Other Ambulatory Visit (HOSPITAL_COMMUNITY): Payer: Self-pay

## 2023-05-12 LAB — BASIC METABOLIC PANEL
Anion gap: 6 (ref 5–15)
BUN: 44 mg/dL — ABNORMAL HIGH (ref 6–20)
CO2: 23 mmol/L (ref 22–32)
Calcium: 8.2 mg/dL — ABNORMAL LOW (ref 8.9–10.3)
Chloride: 112 mmol/L — ABNORMAL HIGH (ref 98–111)
Creatinine, Ser: 1.07 mg/dL — ABNORMAL HIGH (ref 0.44–1.00)
GFR, Estimated: >60 mL/min (ref >60)
Glucose, Bld: 219 mg/dL — ABNORMAL HIGH (ref 70–99)
Potassium: 5.1 mmol/L (ref 3.5–5.1)
Sodium: 141 mmol/L (ref 135–145)

## 2023-05-13 LAB — BLOOD GAS, ARTERIAL
Acid-Base Excess: 1.3 mmol/L (ref 0.0–2.0)
Bicarbonate: 25.9 mmol/L (ref 20.0–28.0)
O2 Saturation: 100 %
Patient temperature: 37.4
pCO2 arterial: 41 mmHg (ref 32–48)
pH, Arterial: 7.41 (ref 7.35–7.45)
pO2, Arterial: 170 mmHg — ABNORMAL HIGH (ref 83–108)

## 2023-05-13 LAB — CULTURE, RESPIRATORY W GRAM STAIN

## 2023-05-14 DIAGNOSIS — I462 Cardiac arrest due to underlying cardiac condition: Secondary | ICD-10-CM | POA: Diagnosis not present

## 2023-05-14 DIAGNOSIS — Y95 Nosocomial condition: Secondary | ICD-10-CM

## 2023-05-14 DIAGNOSIS — J9621 Acute and chronic respiratory failure with hypoxia: Secondary | ICD-10-CM | POA: Diagnosis not present

## 2023-05-14 DIAGNOSIS — J159 Unspecified bacterial pneumonia: Secondary | ICD-10-CM

## 2023-05-14 DIAGNOSIS — G931 Anoxic brain damage, not elsewhere classified: Secondary | ICD-10-CM | POA: Diagnosis not present

## 2023-05-14 DIAGNOSIS — Z930 Tracheostomy status: Secondary | ICD-10-CM | POA: Diagnosis not present

## 2023-05-14 LAB — BASIC METABOLIC PANEL
Anion gap: 10 (ref 5–15)
BUN: 60 mg/dL — ABNORMAL HIGH (ref 6–20)
CO2: 24 mmol/L (ref 22–32)
Calcium: 8.7 mg/dL — ABNORMAL LOW (ref 8.9–10.3)
Chloride: 109 mmol/L (ref 98–111)
Creatinine, Ser: 1.31 mg/dL — ABNORMAL HIGH (ref 0.44–1.00)
GFR, Estimated: 48 mL/min — ABNORMAL LOW (ref >60)
Glucose, Bld: 217 mg/dL — ABNORMAL HIGH (ref 70–99)
Potassium: 5.6 mmol/L — ABNORMAL HIGH (ref 3.5–5.1)
Sodium: 143 mmol/L (ref 135–145)

## 2023-05-14 LAB — CBC
HCT: 24.8 % — ABNORMAL LOW (ref 36.0–46.0)
Hemoglobin: 7.5 g/dL — ABNORMAL LOW (ref 12.0–15.0)
MCH: 25.7 pg — ABNORMAL LOW (ref 26.0–34.0)
MCHC: 30.2 g/dL (ref 30.0–36.0)
MCV: 84.9 fL (ref 80.0–100.0)
Platelets: 295 10*3/uL (ref 150–400)
RBC: 2.92 MIL/uL — ABNORMAL LOW (ref 3.87–5.11)
RDW: 17.2 % — ABNORMAL HIGH (ref 11.5–15.5)
WBC: 12.1 10*3/uL — ABNORMAL HIGH (ref 4.0–10.5)
nRBC: 0.3 % — ABNORMAL HIGH (ref 0.0–0.2)

## 2023-05-14 LAB — MAGNESIUM: Magnesium: 2.2 mg/dL (ref 1.7–2.4)

## 2023-05-15 DIAGNOSIS — J159 Unspecified bacterial pneumonia: Secondary | ICD-10-CM

## 2023-05-15 DIAGNOSIS — I462 Cardiac arrest due to underlying cardiac condition: Secondary | ICD-10-CM | POA: Diagnosis not present

## 2023-05-15 DIAGNOSIS — Z930 Tracheostomy status: Secondary | ICD-10-CM | POA: Diagnosis not present

## 2023-05-15 DIAGNOSIS — G931 Anoxic brain damage, not elsewhere classified: Secondary | ICD-10-CM | POA: Diagnosis not present

## 2023-05-15 DIAGNOSIS — Y95 Nosocomial condition: Secondary | ICD-10-CM

## 2023-05-15 DIAGNOSIS — J9621 Acute and chronic respiratory failure with hypoxia: Secondary | ICD-10-CM | POA: Diagnosis not present

## 2023-05-15 LAB — BASIC METABOLIC PANEL WITH GFR
Anion gap: 9 (ref 5–15)
BUN: 54 mg/dL — ABNORMAL HIGH (ref 6–20)
CO2: 23 mmol/L (ref 22–32)
Calcium: 8.7 mg/dL — ABNORMAL LOW (ref 8.9–10.3)
Chloride: 114 mmol/L — ABNORMAL HIGH (ref 98–111)
Creatinine, Ser: 1 mg/dL (ref 0.44–1.00)
GFR, Estimated: >60 mL/min
Glucose, Bld: 212 mg/dL — ABNORMAL HIGH (ref 70–99)
Potassium: 5.4 mmol/L — ABNORMAL HIGH (ref 3.5–5.1)
Sodium: 146 mmol/L — ABNORMAL HIGH (ref 135–145)

## 2023-05-15 LAB — BASIC METABOLIC PANEL
Anion gap: 7 (ref 5–15)
BUN: 60 mg/dL — ABNORMAL HIGH (ref 6–20)
CO2: 25 mmol/L (ref 22–32)
Calcium: 8.4 mg/dL — ABNORMAL LOW (ref 8.9–10.3)
Chloride: 113 mmol/L — ABNORMAL HIGH (ref 98–111)
Creatinine, Ser: 1.09 mg/dL — ABNORMAL HIGH (ref 0.44–1.00)
GFR, Estimated: 60 mL/min — ABNORMAL LOW (ref >60)
Glucose, Bld: 187 mg/dL — ABNORMAL HIGH (ref 70–99)
Potassium: 5.4 mmol/L — ABNORMAL HIGH (ref 3.5–5.1)
Sodium: 145 mmol/L (ref 135–145)

## 2023-05-15 LAB — POTASSIUM: Potassium: 6 mmol/L — ABNORMAL HIGH (ref 3.5–5.1)

## 2023-05-15 LAB — TRIGLYCERIDES: Triglycerides: 78 mg/dL (ref <150)

## 2023-05-16 DIAGNOSIS — G931 Anoxic brain damage, not elsewhere classified: Secondary | ICD-10-CM | POA: Diagnosis not present

## 2023-05-16 DIAGNOSIS — I462 Cardiac arrest due to underlying cardiac condition: Secondary | ICD-10-CM | POA: Diagnosis not present

## 2023-05-16 DIAGNOSIS — J159 Unspecified bacterial pneumonia: Secondary | ICD-10-CM

## 2023-05-16 DIAGNOSIS — J9621 Acute and chronic respiratory failure with hypoxia: Secondary | ICD-10-CM | POA: Diagnosis not present

## 2023-05-16 DIAGNOSIS — Y95 Nosocomial condition: Secondary | ICD-10-CM

## 2023-05-16 DIAGNOSIS — Z930 Tracheostomy status: Secondary | ICD-10-CM | POA: Diagnosis not present

## 2023-05-16 LAB — BASIC METABOLIC PANEL
Anion gap: 12 (ref 5–15)
BUN: 78 mg/dL — ABNORMAL HIGH (ref 6–20)
CO2: 21 mmol/L — ABNORMAL LOW (ref 22–32)
Calcium: 8.9 mg/dL (ref 8.9–10.3)
Chloride: 110 mmol/L (ref 98–111)
Creatinine, Ser: 1.23 mg/dL — ABNORMAL HIGH (ref 0.44–1.00)
GFR, Estimated: 52 mL/min — ABNORMAL LOW (ref >60)
Glucose, Bld: 369 mg/dL — ABNORMAL HIGH (ref 70–99)
Potassium: 5.9 mmol/L — ABNORMAL HIGH (ref 3.5–5.1)
Sodium: 143 mmol/L (ref 135–145)

## 2023-05-17 DIAGNOSIS — J159 Unspecified bacterial pneumonia: Secondary | ICD-10-CM

## 2023-05-17 DIAGNOSIS — Z930 Tracheostomy status: Secondary | ICD-10-CM | POA: Diagnosis not present

## 2023-05-17 DIAGNOSIS — Y95 Nosocomial condition: Secondary | ICD-10-CM

## 2023-05-17 DIAGNOSIS — I462 Cardiac arrest due to underlying cardiac condition: Secondary | ICD-10-CM | POA: Diagnosis not present

## 2023-05-17 DIAGNOSIS — G931 Anoxic brain damage, not elsewhere classified: Secondary | ICD-10-CM | POA: Diagnosis not present

## 2023-05-17 DIAGNOSIS — J9621 Acute and chronic respiratory failure with hypoxia: Secondary | ICD-10-CM | POA: Diagnosis not present

## 2023-05-17 LAB — BASIC METABOLIC PANEL
Anion gap: 10 (ref 5–15)
BUN: 84 mg/dL — ABNORMAL HIGH (ref 6–20)
CO2: 25 mmol/L (ref 22–32)
Calcium: 9.1 mg/dL (ref 8.9–10.3)
Chloride: 113 mmol/L — ABNORMAL HIGH (ref 98–111)
Creatinine, Ser: 1.28 mg/dL — ABNORMAL HIGH (ref 0.44–1.00)
GFR, Estimated: 49 mL/min — ABNORMAL LOW (ref >60)
Glucose, Bld: 277 mg/dL — ABNORMAL HIGH (ref 70–99)
Potassium: 4.6 mmol/L (ref 3.5–5.1)
Sodium: 148 mmol/L — ABNORMAL HIGH (ref 135–145)

## 2023-05-17 LAB — CBC
HCT: 27.2 % — ABNORMAL LOW (ref 36.0–46.0)
Hemoglobin: 8.2 g/dL — ABNORMAL LOW (ref 12.0–15.0)
MCH: 25.4 pg — ABNORMAL LOW (ref 26.0–34.0)
MCHC: 30.1 g/dL (ref 30.0–36.0)
MCV: 84.2 fL (ref 80.0–100.0)
Platelets: 394 10*3/uL (ref 150–400)
RBC: 3.23 MIL/uL — ABNORMAL LOW (ref 3.87–5.11)
RDW: 17.4 % — ABNORMAL HIGH (ref 11.5–15.5)
WBC: 12.8 10*3/uL — ABNORMAL HIGH (ref 4.0–10.5)
nRBC: 1.3 % — ABNORMAL HIGH (ref 0.0–0.2)

## 2023-05-17 LAB — MAGNESIUM: Magnesium: 2.4 mg/dL (ref 1.7–2.4)

## 2023-05-18 DIAGNOSIS — J9621 Acute and chronic respiratory failure with hypoxia: Secondary | ICD-10-CM

## 2023-05-18 DIAGNOSIS — Z930 Tracheostomy status: Secondary | ICD-10-CM

## 2023-05-18 DIAGNOSIS — G931 Anoxic brain damage, not elsewhere classified: Secondary | ICD-10-CM

## 2023-05-18 DIAGNOSIS — I462 Cardiac arrest due to underlying cardiac condition: Secondary | ICD-10-CM

## 2023-05-18 DIAGNOSIS — J159 Unspecified bacterial pneumonia: Secondary | ICD-10-CM

## 2023-05-18 DIAGNOSIS — Y95 Nosocomial condition: Secondary | ICD-10-CM

## 2023-05-18 LAB — BASIC METABOLIC PANEL WITH GFR
Anion gap: 5 (ref 5–15)
Anion gap: 10 (ref 5–15)
BUN: 70 mg/dL — ABNORMAL HIGH (ref 6–20)
BUN: 66 mg/dL — ABNORMAL HIGH (ref 6–20)
CO2: 28 mmol/L (ref 22–32)
CO2: 27 mmol/L (ref 22–32)
Calcium: 8.6 mg/dL — ABNORMAL LOW (ref 8.9–10.3)
Calcium: 8.8 mg/dL — ABNORMAL LOW (ref 8.9–10.3)
Chloride: 119 mmol/L — ABNORMAL HIGH (ref 98–111)
Chloride: 115 mmol/L — ABNORMAL HIGH (ref 98–111)
Creatinine, Ser: 1.12 mg/dL — ABNORMAL HIGH (ref 0.44–1.00)
Creatinine, Ser: 0.97 mg/dL (ref 0.44–1.00)
GFR, Estimated: 58 mL/min — ABNORMAL LOW (ref >60)
GFR, Estimated: >60 mL/min (ref >60)
Glucose, Bld: 241 mg/dL — ABNORMAL HIGH (ref 70–99)
Glucose, Bld: 245 mg/dL — ABNORMAL HIGH (ref 70–99)
Potassium: 4.7 mmol/L (ref 3.5–5.1)
Potassium: 4.5 mmol/L (ref 3.5–5.1)
Sodium: 152 mmol/L — ABNORMAL HIGH (ref 135–145)
Sodium: 152 mmol/L — ABNORMAL HIGH (ref 135–145)

## 2023-05-18 LAB — TRIGLYCERIDES
Triglycerides: 90 mg/dL (ref <150)
Triglycerides: 74 mg/dL (ref <150)

## 2023-05-19 DIAGNOSIS — Z930 Tracheostomy status: Secondary | ICD-10-CM

## 2023-05-19 DIAGNOSIS — G931 Anoxic brain damage, not elsewhere classified: Secondary | ICD-10-CM

## 2023-05-19 DIAGNOSIS — Y95 Nosocomial condition: Secondary | ICD-10-CM

## 2023-05-19 DIAGNOSIS — I462 Cardiac arrest due to underlying cardiac condition: Secondary | ICD-10-CM

## 2023-05-19 DIAGNOSIS — J159 Unspecified bacterial pneumonia: Secondary | ICD-10-CM

## 2023-05-19 DIAGNOSIS — J9621 Acute and chronic respiratory failure with hypoxia: Secondary | ICD-10-CM

## 2023-05-19 LAB — BASIC METABOLIC PANEL WITH GFR
Anion gap: 8 (ref 5–15)
BUN: 53 mg/dL — ABNORMAL HIGH (ref 6–20)
CO2: 28 mmol/L (ref 22–32)
Calcium: 8.5 mg/dL — ABNORMAL LOW (ref 8.9–10.3)
Chloride: 117 mmol/L — ABNORMAL HIGH (ref 98–111)
Creatinine, Ser: 0.86 mg/dL (ref 0.44–1.00)
GFR, Estimated: >60 mL/min (ref >60)
Glucose, Bld: 209 mg/dL — ABNORMAL HIGH (ref 70–99)
Potassium: 4.1 mmol/L (ref 3.5–5.1)
Sodium: 153 mmol/L — ABNORMAL HIGH (ref 135–145)

## 2023-05-20 DIAGNOSIS — G931 Anoxic brain damage, not elsewhere classified: Secondary | ICD-10-CM

## 2023-05-20 DIAGNOSIS — J9621 Acute and chronic respiratory failure with hypoxia: Secondary | ICD-10-CM

## 2023-05-20 DIAGNOSIS — Z930 Tracheostomy status: Secondary | ICD-10-CM

## 2023-05-20 DIAGNOSIS — Y95 Nosocomial condition: Secondary | ICD-10-CM

## 2023-05-20 DIAGNOSIS — J159 Unspecified bacterial pneumonia: Secondary | ICD-10-CM

## 2023-05-20 DIAGNOSIS — I462 Cardiac arrest due to underlying cardiac condition: Secondary | ICD-10-CM

## 2023-05-21 DIAGNOSIS — G931 Anoxic brain damage, not elsewhere classified: Secondary | ICD-10-CM | POA: Diagnosis not present

## 2023-05-21 DIAGNOSIS — J9621 Acute and chronic respiratory failure with hypoxia: Secondary | ICD-10-CM | POA: Diagnosis not present

## 2023-05-21 DIAGNOSIS — Z930 Tracheostomy status: Secondary | ICD-10-CM | POA: Diagnosis not present

## 2023-05-21 DIAGNOSIS — Y95 Nosocomial condition: Secondary | ICD-10-CM

## 2023-05-21 DIAGNOSIS — J159 Unspecified bacterial pneumonia: Secondary | ICD-10-CM

## 2023-05-21 DIAGNOSIS — I462 Cardiac arrest due to underlying cardiac condition: Secondary | ICD-10-CM | POA: Diagnosis not present

## 2023-05-21 LAB — BASIC METABOLIC PANEL WITH GFR
Anion gap: 12 (ref 5–15)
Anion gap: 9 (ref 5–15)
BUN: 35 mg/dL — ABNORMAL HIGH (ref 6–20)
BUN: 36 mg/dL — ABNORMAL HIGH (ref 6–20)
CO2: 24 mmol/L (ref 22–32)
CO2: 26 mmol/L (ref 22–32)
Calcium: 8.6 mg/dL — ABNORMAL LOW (ref 8.9–10.3)
Calcium: 8.2 mg/dL — ABNORMAL LOW (ref 8.9–10.3)
Chloride: 118 mmol/L — ABNORMAL HIGH (ref 98–111)
Chloride: 119 mmol/L — ABNORMAL HIGH (ref 98–111)
Creatinine, Ser: 0.76 mg/dL (ref 0.44–1.00)
Creatinine, Ser: 0.63 mg/dL (ref 0.44–1.00)
GFR, Estimated: >60 mL/min (ref >60)
GFR, Estimated: >60 mL/min (ref >60)
Glucose, Bld: 183 mg/dL — ABNORMAL HIGH (ref 70–99)
Glucose, Bld: 165 mg/dL — ABNORMAL HIGH (ref 70–99)
Potassium: 3.9 mmol/L (ref 3.5–5.1)
Potassium: 4 mmol/L (ref 3.5–5.1)
Sodium: 154 mmol/L — ABNORMAL HIGH (ref 135–145)
Sodium: 154 mmol/L — ABNORMAL HIGH (ref 135–145)

## 2023-05-21 LAB — TRIGLYCERIDES
Triglycerides: 131 mg/dL (ref <150)
Triglycerides: 123 mg/dL (ref <150)

## 2023-05-22 ENCOUNTER — Other Ambulatory Visit (HOSPITAL_COMMUNITY): Payer: Self-pay

## 2023-05-22 DIAGNOSIS — G931 Anoxic brain damage, not elsewhere classified: Secondary | ICD-10-CM

## 2023-05-22 DIAGNOSIS — Y95 Nosocomial condition: Secondary | ICD-10-CM

## 2023-05-22 DIAGNOSIS — Z930 Tracheostomy status: Secondary | ICD-10-CM

## 2023-05-22 DIAGNOSIS — J159 Unspecified bacterial pneumonia: Secondary | ICD-10-CM

## 2023-05-22 DIAGNOSIS — J9621 Acute and chronic respiratory failure with hypoxia: Secondary | ICD-10-CM

## 2023-05-22 DIAGNOSIS — I462 Cardiac arrest due to underlying cardiac condition: Secondary | ICD-10-CM

## 2023-05-23 DIAGNOSIS — J9621 Acute and chronic respiratory failure with hypoxia: Secondary | ICD-10-CM

## 2023-05-23 DIAGNOSIS — Y95 Nosocomial condition: Secondary | ICD-10-CM

## 2023-05-23 DIAGNOSIS — I462 Cardiac arrest due to underlying cardiac condition: Secondary | ICD-10-CM

## 2023-05-23 DIAGNOSIS — J159 Unspecified bacterial pneumonia: Secondary | ICD-10-CM

## 2023-05-23 DIAGNOSIS — Z930 Tracheostomy status: Secondary | ICD-10-CM

## 2023-05-23 DIAGNOSIS — G931 Anoxic brain damage, not elsewhere classified: Secondary | ICD-10-CM

## 2023-05-24 DIAGNOSIS — Z930 Tracheostomy status: Secondary | ICD-10-CM

## 2023-05-24 DIAGNOSIS — G931 Anoxic brain damage, not elsewhere classified: Secondary | ICD-10-CM

## 2023-05-24 DIAGNOSIS — I462 Cardiac arrest due to underlying cardiac condition: Secondary | ICD-10-CM

## 2023-05-24 DIAGNOSIS — J9621 Acute and chronic respiratory failure with hypoxia: Secondary | ICD-10-CM

## 2023-05-24 DIAGNOSIS — Y95 Nosocomial condition: Secondary | ICD-10-CM

## 2023-05-24 DIAGNOSIS — J159 Unspecified bacterial pneumonia: Secondary | ICD-10-CM

## 2023-05-25 ENCOUNTER — Other Ambulatory Visit (HOSPITAL_COMMUNITY): Payer: Self-pay

## 2023-05-25 DIAGNOSIS — Y95 Nosocomial condition: Secondary | ICD-10-CM

## 2023-05-25 DIAGNOSIS — I462 Cardiac arrest due to underlying cardiac condition: Secondary | ICD-10-CM | POA: Diagnosis not present

## 2023-05-25 DIAGNOSIS — Z930 Tracheostomy status: Secondary | ICD-10-CM | POA: Diagnosis not present

## 2023-05-25 DIAGNOSIS — J9621 Acute and chronic respiratory failure with hypoxia: Secondary | ICD-10-CM | POA: Diagnosis not present

## 2023-05-25 DIAGNOSIS — G931 Anoxic brain damage, not elsewhere classified: Secondary | ICD-10-CM | POA: Diagnosis not present

## 2023-05-25 DIAGNOSIS — J159 Unspecified bacterial pneumonia: Secondary | ICD-10-CM

## 2023-05-25 LAB — CBC
HCT: 30.5 % — ABNORMAL LOW (ref 36.0–46.0)
Hemoglobin: 9.1 g/dL — ABNORMAL LOW (ref 12.0–15.0)
MCH: 25.1 pg — ABNORMAL LOW (ref 26.0–34.0)
MCHC: 29.8 g/dL — ABNORMAL LOW (ref 30.0–36.0)
MCV: 84 fL (ref 80.0–100.0)
Platelets: 173 10*3/uL (ref 150–400)
RBC: 3.63 MIL/uL — ABNORMAL LOW (ref 3.87–5.11)
RDW: 18.4 % — ABNORMAL HIGH (ref 11.5–15.5)
WBC: 17.2 10*3/uL — ABNORMAL HIGH (ref 4.0–10.5)
nRBC: 0.5 % — ABNORMAL HIGH (ref 0.0–0.2)

## 2023-05-25 LAB — COMPREHENSIVE METABOLIC PANEL WITH GFR
ALT: 260 U/L — ABNORMAL HIGH (ref 0–44)
AST: 59 U/L — ABNORMAL HIGH (ref 15–41)
Albumin: 1.8 g/dL — ABNORMAL LOW (ref 3.5–5.0)
Alkaline Phosphatase: 220 U/L — ABNORMAL HIGH (ref 38–126)
Anion gap: 14 (ref 5–15)
BUN: 42 mg/dL — ABNORMAL HIGH (ref 6–20)
CO2: 19 mmol/L — ABNORMAL LOW (ref 22–32)
Calcium: 7.9 mg/dL — ABNORMAL LOW (ref 8.9–10.3)
Chloride: 119 mmol/L — ABNORMAL HIGH (ref 98–111)
Creatinine, Ser: 1.26 mg/dL — ABNORMAL HIGH (ref 0.44–1.00)
GFR, Estimated: 50 mL/min — ABNORMAL LOW (ref >60)
Glucose, Bld: 104 mg/dL — ABNORMAL HIGH (ref 70–99)
Potassium: 3.9 mmol/L (ref 3.5–5.1)
Sodium: 152 mmol/L — ABNORMAL HIGH (ref 135–145)
Total Bilirubin: 0.8 mg/dL (ref 0.0–1.2)
Total Protein: 4.5 g/dL — ABNORMAL LOW (ref 6.5–8.1)

## 2023-05-25 LAB — LACTIC ACID, PLASMA
Lactic Acid, Venous: 4.9 mmol/L (ref 0.5–1.9)
Lactic Acid, Venous: 0.6 mmol/L (ref 0.5–1.9)

## 2023-05-25 LAB — TROPONIN I (HIGH SENSITIVITY)
Troponin I (High Sensitivity): 1662 ng/L (ref <18)
Troponin I (High Sensitivity): 2650 ng/L (ref <18)
Troponin I (High Sensitivity): 8945 ng/L (ref <18)

## 2023-05-25 LAB — MAGNESIUM: Magnesium: 1.9 mg/dL (ref 1.7–2.4)

## 2023-05-26 ENCOUNTER — Other Ambulatory Visit (HOSPITAL_COMMUNITY): Payer: Self-pay

## 2023-05-26 DIAGNOSIS — G931 Anoxic brain damage, not elsewhere classified: Secondary | ICD-10-CM

## 2023-05-26 DIAGNOSIS — J159 Unspecified bacterial pneumonia: Secondary | ICD-10-CM

## 2023-05-26 DIAGNOSIS — Z930 Tracheostomy status: Secondary | ICD-10-CM

## 2023-05-26 DIAGNOSIS — Y95 Nosocomial condition: Secondary | ICD-10-CM

## 2023-05-26 DIAGNOSIS — I462 Cardiac arrest due to underlying cardiac condition: Secondary | ICD-10-CM

## 2023-05-26 DIAGNOSIS — J9621 Acute and chronic respiratory failure with hypoxia: Secondary | ICD-10-CM

## 2023-05-26 LAB — TROPONIN I (HIGH SENSITIVITY): Troponin I (High Sensitivity): 11474 ng/L (ref <18)

## 2023-05-26 LAB — URINALYSIS, W/ REFLEX TO CULTURE (INFECTION SUSPECTED)
Bilirubin Urine: NEGATIVE
Glucose, UA: >=500 mg/dL — AB
Ketones, ur: NEGATIVE mg/dL
Nitrite: NEGATIVE
Protein, ur: 100 mg/dL — AB
Specific Gravity, Urine: 1.025 (ref 1.005–1.030)
Squamous Epithelial / HPF: >50 /HPF (ref 0–5)
pH: 5 (ref 5.0–8.0)

## 2023-05-26 LAB — BLOOD CULTURE ID PANEL (REFLEXED) - BCID2
A.calcoaceticus-baumannii: NOT DETECTED
Bacteroides fragilis: NOT DETECTED
CTX-M ESBL: DETECTED — AB
Candida albicans: NOT DETECTED
Candida auris: NOT DETECTED
Candida glabrata: NOT DETECTED
Candida krusei: NOT DETECTED
Candida parapsilosis: NOT DETECTED
Candida tropicalis: NOT DETECTED
Carbapenem resist OXA 48 LIKE: NOT DETECTED
Carbapenem resistance IMP: NOT DETECTED
Carbapenem resistance KPC: NOT DETECTED
Carbapenem resistance NDM: NOT DETECTED
Carbapenem resistance VIM: NOT DETECTED
Cryptococcus neoformans/gattii: NOT DETECTED
Enterobacter cloacae complex: NOT DETECTED
Enterobacterales: DETECTED — AB
Enterococcus Faecium: NOT DETECTED
Enterococcus faecalis: NOT DETECTED
Escherichia coli: DETECTED — AB
Haemophilus influenzae: NOT DETECTED
Klebsiella aerogenes: NOT DETECTED
Klebsiella oxytoca: NOT DETECTED
Klebsiella pneumoniae: NOT DETECTED
Listeria monocytogenes: NOT DETECTED
Meth resistant mecA/C and MREJ: DETECTED — AB
Neisseria meningitidis: NOT DETECTED
Proteus species: NOT DETECTED
Pseudomonas aeruginosa: NOT DETECTED
Salmonella species: NOT DETECTED
Serratia marcescens: NOT DETECTED
Staphylococcus aureus (BCID): DETECTED — AB
Staphylococcus epidermidis: NOT DETECTED
Staphylococcus lugdunensis: NOT DETECTED
Staphylococcus species: DETECTED — AB
Stenotrophomonas maltophilia: NOT DETECTED
Streptococcus agalactiae: NOT DETECTED
Streptococcus pneumoniae: NOT DETECTED
Streptococcus pyogenes: NOT DETECTED
Streptococcus species: NOT DETECTED

## 2023-05-26 LAB — CBC
HCT: 29.3 % — ABNORMAL LOW (ref 36.0–46.0)
HCT: 26.9 % — ABNORMAL LOW (ref 36.0–46.0)
Hemoglobin: 9.2 g/dL — ABNORMAL LOW (ref 12.0–15.0)
Hemoglobin: 8.4 g/dL — ABNORMAL LOW (ref 12.0–15.0)
MCH: 25.6 pg — ABNORMAL LOW (ref 26.0–34.0)
MCH: 25.5 pg — ABNORMAL LOW (ref 26.0–34.0)
MCHC: 31.4 g/dL (ref 30.0–36.0)
MCHC: 31.2 g/dL (ref 30.0–36.0)
MCV: 81.6 fL (ref 80.0–100.0)
MCV: 81.8 fL (ref 80.0–100.0)
Platelets: 154 10*3/uL (ref 150–400)
Platelets: 143 10*3/uL — ABNORMAL LOW (ref 150–400)
RBC: 3.59 MIL/uL — ABNORMAL LOW (ref 3.87–5.11)
RBC: 3.29 MIL/uL — ABNORMAL LOW (ref 3.87–5.11)
RDW: 18.4 % — ABNORMAL HIGH (ref 11.5–15.5)
RDW: 18.6 % — ABNORMAL HIGH (ref 11.5–15.5)
WBC: 8.2 10*3/uL (ref 4.0–10.5)
WBC: 6.1 10*3/uL (ref 4.0–10.5)
nRBC: 0.4 % — ABNORMAL HIGH (ref 0.0–0.2)
nRBC: 1 % — ABNORMAL HIGH (ref 0.0–0.2)

## 2023-05-26 LAB — ECHOCARDIOGRAM COMPLETE
Area-P 1/2: 4.68 cm2
Calc EF: 39.3 %
S' Lateral: 3.9 cm
Single Plane A2C EF: 37.5 %
Single Plane A4C EF: 42.3 %
Weight: 2903.02 [oz_av]

## 2023-05-26 LAB — HEPARIN LEVEL (UNFRACTIONATED)
Heparin Unfractionated: 0.15 [IU]/mL — ABNORMAL LOW (ref 0.30–0.70)
Heparin Unfractionated: 0.46 [IU]/mL (ref 0.30–0.70)
Heparin Unfractionated: 0.28 [IU]/mL — ABNORMAL LOW (ref 0.30–0.70)

## 2023-05-26 LAB — COMPREHENSIVE METABOLIC PANEL WITH GFR
ALT: 184 U/L — ABNORMAL HIGH (ref 0–44)
AST: 56 U/L — ABNORMAL HIGH (ref 15–41)
Albumin: 2.3 g/dL — ABNORMAL LOW (ref 3.5–5.0)
Alkaline Phosphatase: 279 U/L — ABNORMAL HIGH (ref 38–126)
Anion gap: 12 (ref 5–15)
BUN: 46 mg/dL — ABNORMAL HIGH (ref 6–20)
CO2: 18 mmol/L — ABNORMAL LOW (ref 22–32)
Calcium: 8.3 mg/dL — ABNORMAL LOW (ref 8.9–10.3)
Chloride: 120 mmol/L — ABNORMAL HIGH (ref 98–111)
Creatinine, Ser: 1.25 mg/dL — ABNORMAL HIGH (ref 0.44–1.00)
GFR, Estimated: 51 mL/min — ABNORMAL LOW (ref >60)
Glucose, Bld: 262 mg/dL — ABNORMAL HIGH (ref 70–99)
Potassium: 3.5 mmol/L (ref 3.5–5.1)
Sodium: 150 mmol/L — ABNORMAL HIGH (ref 135–145)
Total Bilirubin: 1.2 mg/dL (ref 0.0–1.2)
Total Protein: 5.4 g/dL — ABNORMAL LOW (ref 6.5–8.1)

## 2023-05-26 LAB — LACTIC ACID, PLASMA: Lactic Acid, Venous: 2.4 mmol/L (ref 0.5–1.9)

## 2023-05-26 LAB — APTT
aPTT: 38 s — ABNORMAL HIGH (ref 24–36)
aPTT: 58 s — ABNORMAL HIGH (ref 24–36)
aPTT: 43 s — ABNORMAL HIGH (ref 24–36)

## 2023-05-26 LAB — MAGNESIUM: Magnesium: 2.1 mg/dL (ref 1.7–2.4)

## 2023-05-26 NOTE — Consults (Signed)
 Referring Physician: S. Delores. MD + PA  Vickie Little is an 58 y.o. female.                       Chief Complaint: Abnormal troponin I  HPI: 58 years old female with past medical history of CAD, NSTEMI, diabetes, hypertension, metabolic syndrome was admitted at [facility] [place] from 04/02/2023 to 04/18/2023.  She has elevated troponin I levels without EKG evidence of ST elevations. Patient has anoxic brain injury and recurrent PEA arrest.  Social History:  Social History   Tobacco Use  . Smoking status: Unknown  . Smokeless tobacco: Not on file  Substance Use Topics  . Alcohol use: Defer    Allergies: No Known Allergies  @PTAMED @  Scheduled/Continuous Medications     Continuous         dextrose  5% in water  infusion  100 mL/hr, IV, Continuous     heparin  infusion in 0.45% NaCl (100 units/mL) (premix)  0-31 Units/kg/hr, CI, Titrated     Norepinephrine  Bitartrate (LEVOPHED ) 8,000 mcg in dextrose  5% in water  250 mL (32 mcg/mL) infusion  0-30 mcg/min, CI, Titrated           Scheduled         Acidophilus Lactobacillus 1 each  1 each, Tube, BID     amantadine (SYMMETREL) capsule 100 mg  100 mg, PO/Per Tube, Once a day     amiodarone  (PACERONE ) tablet 200 mg  200 mg, PO/Per Tube, BID     [Held by provider] atorvastatin  (LIPITOR ) tablet 20 mg On hold since yesterday at 1610 until manually unheld; held by [employer], NPHold Reason: Order parameters not metHold Comment: Elevated AST/ALT  20 mg, PO/Per Tube, Nightly     clonazePAM (KlonoPIN) tablet 0.5 mg  0.5 mg, PO/Per Tube, TID     Electrolyte Replacement  No Dose/Rate, XX, Once a day     ezetimibe  (ZETIA ) tablet 10 mg  10 mg, PO/Per Tube, Nightly     fish oil oral liquid 10 mL  10 mL, PO, Once a day     insulin  glargine (LANTUS ) injection 42 Units  42 Units, [place], Q12H SCH     insulin  lispro injection 0-20 Units (And Linked Group #1)  0-20 Units, [place], Q6H SCH     meclizine (ANTIVERT)  tablet 12.5 mg  12.5 mg, PO/Per Tube, BID     melatonin tablet 3 mg  3 mg, PO/Per Tube, Nightly     midodrine (PROAMATINE) tablet 5 mg  5 mg, PO/Per Tube, TID     Omeprazole ODT (Prilosec OTC) disintegrating tablet 40 mg  40 mg, PO, Once a day     piperacillin -tazobactam (ZOSYN ) 3.375 g in sodium chloride  0.9% 100 mL IVPB-MBP  3.375 g, IV, Q6H     predniSONE (DELTASONE) tablet 20 mg  20 mg, PO/Per Tube, BID     senna (SENOKOT) tablet 8.6 mg  8.6 mg, PO/Per Tube, BID     sertraline (ZOLOFT) tablet 50 mg  50 mg, PO/Per Tube, Once a day     terazosin (HYTRIN) capsule 1 mg  1 mg, PO/Per Tube, Nightly     Vancomycin  HCl (VANCOCIN ) 1,000 mg in sodium chloride  0.9 % 250 mL IVPB  1,000 mg, IV, Q24H     vancomycin  IV per policy  No Dose/Rate, XX, Once a day              Blood pressure 137/86, pulse 81, temperature 98 F (36.7 C),  temperature source Axillary, resp. rate (!) 33, height 5' 7 (1.702 m), weight 195 lb (88.5 kg), SpO2 99%, peak flow 60 L/min. Body mass index is 30.54 kg/m. General appearance: awake, cooperative, appears stated age and moderate to severe respiratory distress Head: Normocephalic, atraumatic. Eyes: Brown eyes, pale conjunctiva, corneas clear.  Neck: No adenopathy, no carotid bruit, no JVD, supple, symmetrical, trachea midline and thyroid  not enlarged. Resp: Clearing to auscultation bilaterally. Cardio: Regular rate and rhythm, S1, S2 normal, II/VI systolic murmur, no click, rub or gallop GI: Soft, non-tender; bowel sounds normal; no organomegaly. Extremities: Trace edema, cyanosis or clubbing. Skin: Warm and dry.  Neurologic: Alert and oriented X 0.  Assessment/Plan Abnormal troponin I r/o MI v/s demand ischemia Acute on chronic respiratory failure with hypoxia Recurrent PEA arrests Anoxic brain injury CAD Type 2 DM COVID-19 infection  Plan: Continue medical treatment. Start IV heparin  x 48 hours if Troponin I levels elevate  further.  Time spent: Review of old records, Lab, x-rays, EKG, other cardiac tests, examination, discussion with patient/Nurse/Family over 70 minutes.  CLAUDENE SALENA RAMAN, MD  05/26/2023, 7:00 PM EDT

## 2023-05-26 NOTE — Progress Notes (Signed)
  Echocardiogram 2D Echocardiogram has been performed.  Vickie Little 05/26/2023, 3:53 PM

## 2023-05-26 NOTE — Consults (Signed)
 Ref: Madelin Rachel Brought, MD   Subjective:  Further escalation of troponin I without EKG evidence of ST elevations. Repeat echocardiogram shows mild LV systolic dysfunction with moderate MR, mild TR.  Objective:  Vital Signs in the last 24 hours: P: 81, R: 30, BP: 137/86  Physical Exam: BP Readings from Last 1 Encounters:  05/26/23 137/86     Wt Readings from Last 1 Encounters:  05/26/23 195 lb (88.5 kg)    @WTCHANGE @ Body mass index is 30.54 kg/m. HEENT: [place]/AT, Eyes-Brown, Conjunctiva-Pale, Sclera-Non-icteric Neck: No JVD, No bruit, Tracheostomy present. Lungs:  Clearing, Bilateral. Cardiac:  Regular rhythm, normal S1 and S2, no S3. II/VI systolic murmur. Abdomen:  Soft, non-tender. BS present. Extremities:  Trace edema present. No cyanosis. No clubbing. CNS: AxOx0.  Skin: Warm and dry.   Scheduled Meds: Scheduled/Continuous Medications     Continuous         dextrose  5% in water  infusion  100 mL/hr, IV, Continuous     heparin  infusion in 0.45% NaCl (100 units/mL) (premix)  0-31 Units/kg/hr, CI, Titrated     Norepinephrine  Bitartrate (LEVOPHED ) 8,000 mcg in dextrose  5% in water  250 mL (32 mcg/mL) infusion  0-30 mcg/min, CI, Titrated           Scheduled         Acidophilus Lactobacillus 1 each  1 each, Tube, BID     amantadine (SYMMETREL) capsule 100 mg  100 mg, PO/Per Tube, Once a day     amiodarone  (PACERONE ) tablet 200 mg  200 mg, PO/Per Tube, BID     [Held by provider] atorvastatin  (LIPITOR ) tablet 20 mg On hold since yesterday at 1610 until manually unheld; held by [employer], NPHold Reason: Order parameters not metHold Comment: Elevated AST/ALT  20 mg, PO/Per Tube, Nightly     clonazePAM (KlonoPIN) tablet 0.5 mg  0.5 mg, PO/Per Tube, TID     Electrolyte Replacement  No Dose/Rate, XX, Once a day     ezetimibe  (ZETIA ) tablet 10 mg  10 mg, PO/Per Tube, Nightly     fish oil oral liquid 10 mL  10 mL, PO, Once a day     insulin   glargine (LANTUS ) injection 42 Units  42 Units, [place], Q12H SCH     insulin  lispro injection 0-20 Units (And Linked Group #1)  0-20 Units, [place], Q6H SCH     meclizine (ANTIVERT) tablet 12.5 mg  12.5 mg, PO/Per Tube, BID     melatonin tablet 3 mg  3 mg, PO/Per Tube, Nightly     midodrine (PROAMATINE) tablet 5 mg  5 mg, PO/Per Tube, TID     Omeprazole ODT (Prilosec OTC) disintegrating tablet 40 mg  40 mg, PO, Once a day     piperacillin -tazobactam (ZOSYN ) 3.375 g in sodium chloride  0.9% 100 mL IVPB-MBP  3.375 g, IV, Q6H     predniSONE (DELTASONE) tablet 20 mg  20 mg, PO/Per Tube, BID     senna (SENOKOT) tablet 8.6 mg  8.6 mg, PO/Per Tube, BID     sertraline (ZOLOFT) tablet 50 mg  50 mg, PO/Per Tube, Once a day     terazosin (HYTRIN) capsule 1 mg  1 mg, PO/Per Tube, Nightly     Vancomycin  HCl (VANCOCIN ) 1,000 mg in sodium chloride  0.9 % 250 mL IVPB  1,000 mg, IV, Q24H     vancomycin  IV per policy  No Dose/Rate, XX, Once a day             Continuous Infusions:dextrose ,  100 mL/hr, Last Rate: 100 mL/hr (05/26/23 1128) heparin  (porcine), 0-31 Units/kg/hr (Adjusted), Last Rate: 7 Units/kg/hr (05/26/23 1854) norepinephrine  (LEVOPHED ) infusion, 0-30 mcg/min, Last Rate: 7 mcg/min (05/26/23 1852)   PRN Meds:..  [DISCONTINUED] glucose **OR** glucose **OR** juice or non-diet carbonated beverage .  acetaminophen  .  clonazePAM .  dextrose  **OR** dextrose  .  glucagon .  magnesium  oxide **OR** magnesium  sulfate .  ondansetron  .  oxyCODONE  .  Pharmacy Communication .  potassium **OR** potassium chloride   Assessment/Plan: NSTEMI Acute on chronic respiratory failure with hypoxia Recurrent PEA arrests Anoxic brain injury CAD Type 2 DM COVID-19 infection  Plan: Continue IV heparin  for 48 hours. Add small dose beta-blocker.   LOS: 38 days   Time spent including chart review, lab review, examination, discussion with patient/PA : 30 min   Salena Negri  MD  05/26/2023,  7:10 PM EDT

## 2023-05-27 DIAGNOSIS — Z930 Tracheostomy status: Secondary | ICD-10-CM

## 2023-05-27 DIAGNOSIS — I462 Cardiac arrest due to underlying cardiac condition: Secondary | ICD-10-CM

## 2023-05-27 DIAGNOSIS — J9621 Acute and chronic respiratory failure with hypoxia: Secondary | ICD-10-CM

## 2023-05-27 DIAGNOSIS — G931 Anoxic brain damage, not elsewhere classified: Secondary | ICD-10-CM

## 2023-05-27 DIAGNOSIS — Y95 Nosocomial condition: Secondary | ICD-10-CM

## 2023-05-27 DIAGNOSIS — J159 Unspecified bacterial pneumonia: Secondary | ICD-10-CM

## 2023-05-27 LAB — CBC
HCT: 26.4 % — ABNORMAL LOW (ref 36.0–46.0)
Hemoglobin: 8.2 g/dL — ABNORMAL LOW (ref 12.0–15.0)
MCH: 25.3 pg — ABNORMAL LOW (ref 26.0–34.0)
MCHC: 31.1 g/dL (ref 30.0–36.0)
MCV: 81.5 fL (ref 80.0–100.0)
Platelets: 131 10*3/uL — ABNORMAL LOW (ref 150–400)
RBC: 3.24 MIL/uL — ABNORMAL LOW (ref 3.87–5.11)
RDW: 18.8 % — ABNORMAL HIGH (ref 11.5–15.5)
WBC: 6.8 10*3/uL (ref 4.0–10.5)
nRBC: 1.3 % — ABNORMAL HIGH (ref 0.0–0.2)

## 2023-05-27 LAB — COMPREHENSIVE METABOLIC PANEL WITH GFR
ALT: 112 U/L — ABNORMAL HIGH (ref 0–44)
AST: 27 U/L (ref 15–41)
Albumin: 1.7 g/dL — ABNORMAL LOW (ref 3.5–5.0)
Alkaline Phosphatase: 276 U/L — ABNORMAL HIGH (ref 38–126)
Anion gap: 8 (ref 5–15)
BUN: 27 mg/dL — ABNORMAL HIGH (ref 6–20)
CO2: 20 mmol/L — ABNORMAL LOW (ref 22–32)
Calcium: 8.1 mg/dL — ABNORMAL LOW (ref 8.9–10.3)
Chloride: 119 mmol/L — ABNORMAL HIGH (ref 98–111)
Creatinine, Ser: 0.69 mg/dL (ref 0.44–1.00)
GFR, Estimated: >60 mL/min (ref >60)
Glucose, Bld: 230 mg/dL — ABNORMAL HIGH (ref 70–99)
Potassium: 3.6 mmol/L (ref 3.5–5.1)
Sodium: 147 mmol/L — ABNORMAL HIGH (ref 135–145)
Total Bilirubin: 0.9 mg/dL (ref 0.0–1.2)
Total Protein: 5.4 g/dL — ABNORMAL LOW (ref 6.5–8.1)

## 2023-05-27 LAB — CULTURE, RESPIRATORY W GRAM STAIN

## 2023-05-27 LAB — HEPARIN LEVEL (UNFRACTIONATED)
Heparin Unfractionated: 0.11 [IU]/mL — ABNORMAL LOW (ref 0.30–0.70)
Heparin Unfractionated: 0.1 [IU]/mL — ABNORMAL LOW (ref 0.30–0.70)
Heparin Unfractionated: 0.1 [IU]/mL — ABNORMAL LOW (ref 0.30–0.70)
Heparin Unfractionated: 0.12 [IU]/mL — ABNORMAL LOW (ref 0.30–0.70)

## 2023-05-27 LAB — APTT
aPTT: 39 s — ABNORMAL HIGH (ref 24–36)
aPTT: 40 s — ABNORMAL HIGH (ref 24–36)
aPTT: 43 s — ABNORMAL HIGH (ref 24–36)

## 2023-05-28 LAB — HEPARIN LEVEL (UNFRACTIONATED)
Heparin Unfractionated: 0.1 [IU]/mL — ABNORMAL LOW (ref 0.30–0.70)
Heparin Unfractionated: 0.1 [IU]/mL — ABNORMAL LOW (ref 0.30–0.70)
Heparin Unfractionated: 0.14 [IU]/mL — ABNORMAL LOW (ref 0.30–0.70)
Heparin Unfractionated: 0.19 [IU]/mL — ABNORMAL LOW (ref 0.30–0.70)

## 2023-05-28 LAB — CBC
HCT: 27.6 % — ABNORMAL LOW (ref 36.0–46.0)
Hemoglobin: 8.5 g/dL — ABNORMAL LOW (ref 12.0–15.0)
MCH: 25.2 pg — ABNORMAL LOW (ref 26.0–34.0)
MCHC: 30.8 g/dL (ref 30.0–36.0)
MCV: 81.9 fL (ref 80.0–100.0)
Platelets: 135 10*3/uL — ABNORMAL LOW (ref 150–400)
RBC: 3.37 MIL/uL — ABNORMAL LOW (ref 3.87–5.11)
RDW: 18.7 % — ABNORMAL HIGH (ref 11.5–15.5)
WBC: 5.8 10*3/uL (ref 4.0–10.5)
nRBC: 3.1 % — ABNORMAL HIGH (ref 0.0–0.2)

## 2023-05-28 LAB — CULTURE, BLOOD (ROUTINE X 2): Special Requests: ADEQUATE

## 2023-05-28 LAB — APTT
aPTT: 42 s — ABNORMAL HIGH (ref 24–36)
aPTT: 42 s — ABNORMAL HIGH (ref 24–36)
aPTT: 51 s — ABNORMAL HIGH (ref 24–36)
aPTT: 57 s — ABNORMAL HIGH (ref 24–36)

## 2023-05-28 LAB — URINE CULTURE: Culture: 30000 — AB

## 2023-05-28 LAB — VANCOMYCIN, TROUGH: Vancomycin Tr: 4 ug/mL — ABNORMAL LOW (ref 15–20)

## 2023-05-29 LAB — URINE CULTURE

## 2023-05-29 LAB — APTT: aPTT: 67 s — ABNORMAL HIGH (ref 24–36)

## 2023-05-29 LAB — HEPARIN LEVEL (UNFRACTIONATED): Heparin Unfractionated: 0.23 [IU]/mL — ABNORMAL LOW (ref 0.30–0.70)

## 2023-05-30 DIAGNOSIS — J9621 Acute and chronic respiratory failure with hypoxia: Secondary | ICD-10-CM

## 2023-05-30 DIAGNOSIS — G931 Anoxic brain damage, not elsewhere classified: Secondary | ICD-10-CM

## 2023-05-30 DIAGNOSIS — Y95 Nosocomial condition: Secondary | ICD-10-CM

## 2023-05-30 DIAGNOSIS — Z930 Tracheostomy status: Secondary | ICD-10-CM

## 2023-05-30 DIAGNOSIS — J159 Unspecified bacterial pneumonia: Secondary | ICD-10-CM

## 2023-05-30 DIAGNOSIS — I462 Cardiac arrest due to underlying cardiac condition: Secondary | ICD-10-CM

## 2023-05-30 LAB — CBC
HCT: 22.9 % — ABNORMAL LOW (ref 36.0–46.0)
Hemoglobin: 7.1 g/dL — ABNORMAL LOW (ref 12.0–15.0)
MCH: 25 pg — ABNORMAL LOW (ref 26.0–34.0)
MCHC: 31 g/dL (ref 30.0–36.0)
MCV: 80.6 fL (ref 80.0–100.0)
Platelets: 138 10*3/uL — ABNORMAL LOW (ref 150–400)
RBC: 2.84 MIL/uL — ABNORMAL LOW (ref 3.87–5.11)
RDW: 18.4 % — ABNORMAL HIGH (ref 11.5–15.5)
WBC: 9.8 10*3/uL (ref 4.0–10.5)
nRBC: 1.3 % — ABNORMAL HIGH (ref 0.0–0.2)

## 2023-05-30 LAB — VANCOMYCIN, TROUGH: Vancomycin Tr: 8 ug/mL — ABNORMAL LOW (ref 15–20)

## 2023-05-30 LAB — PREPARE RBC (CROSSMATCH)

## 2023-05-30 LAB — HEMOGLOBIN AND HEMATOCRIT, BLOOD
HCT: 26.4 % — ABNORMAL LOW (ref 36.0–46.0)
Hemoglobin: 8.5 g/dL — ABNORMAL LOW (ref 12.0–15.0)

## 2023-05-31 DIAGNOSIS — I462 Cardiac arrest due to underlying cardiac condition: Secondary | ICD-10-CM | POA: Diagnosis not present

## 2023-05-31 DIAGNOSIS — J9621 Acute and chronic respiratory failure with hypoxia: Secondary | ICD-10-CM | POA: Diagnosis not present

## 2023-05-31 DIAGNOSIS — Y95 Nosocomial condition: Secondary | ICD-10-CM

## 2023-05-31 DIAGNOSIS — G931 Anoxic brain damage, not elsewhere classified: Secondary | ICD-10-CM | POA: Diagnosis not present

## 2023-05-31 DIAGNOSIS — Z930 Tracheostomy status: Secondary | ICD-10-CM | POA: Diagnosis not present

## 2023-05-31 DIAGNOSIS — J159 Unspecified bacterial pneumonia: Secondary | ICD-10-CM

## 2023-05-31 LAB — TYPE AND SCREEN
ABO/RH(D): O POS
Antibody Screen: NEGATIVE
Unit division: 0

## 2023-05-31 LAB — BPAM RBC
Blood Product Expiration Date: 202505012359
ISSUE DATE / TIME: 202504081159
Unit Type and Rh: 5100

## 2023-06-01 DIAGNOSIS — J159 Unspecified bacterial pneumonia: Secondary | ICD-10-CM

## 2023-06-01 DIAGNOSIS — Y95 Nosocomial condition: Secondary | ICD-10-CM

## 2023-06-01 DIAGNOSIS — G931 Anoxic brain damage, not elsewhere classified: Secondary | ICD-10-CM

## 2023-06-01 DIAGNOSIS — Z930 Tracheostomy status: Secondary | ICD-10-CM

## 2023-06-01 DIAGNOSIS — J9621 Acute and chronic respiratory failure with hypoxia: Secondary | ICD-10-CM

## 2023-06-01 DIAGNOSIS — I462 Cardiac arrest due to underlying cardiac condition: Secondary | ICD-10-CM

## 2023-06-01 LAB — BASIC METABOLIC PANEL WITH GFR
Anion gap: 10 (ref 5–15)
BUN: 22 mg/dL — ABNORMAL HIGH (ref 6–20)
CO2: 19 mmol/L — ABNORMAL LOW (ref 22–32)
Calcium: 8.4 mg/dL — ABNORMAL LOW (ref 8.9–10.3)
Chloride: 110 mmol/L (ref 98–111)
Creatinine, Ser: 0.45 mg/dL (ref 0.44–1.00)
GFR, Estimated: >60 mL/min (ref >60)
Glucose, Bld: 116 mg/dL — ABNORMAL HIGH (ref 70–99)
Potassium: 4.4 mmol/L (ref 3.5–5.1)
Sodium: 139 mmol/L (ref 135–145)

## 2023-06-01 LAB — VANCOMYCIN, TROUGH: Vancomycin Tr: 11 ug/mL — ABNORMAL LOW (ref 15–20)

## 2023-06-03 DIAGNOSIS — I462 Cardiac arrest due to underlying cardiac condition: Secondary | ICD-10-CM

## 2023-06-03 DIAGNOSIS — Y95 Nosocomial condition: Secondary | ICD-10-CM

## 2023-06-03 DIAGNOSIS — Z930 Tracheostomy status: Secondary | ICD-10-CM

## 2023-06-03 DIAGNOSIS — G931 Anoxic brain damage, not elsewhere classified: Secondary | ICD-10-CM

## 2023-06-03 DIAGNOSIS — J159 Unspecified bacterial pneumonia: Secondary | ICD-10-CM

## 2023-06-03 DIAGNOSIS — J9621 Acute and chronic respiratory failure with hypoxia: Secondary | ICD-10-CM

## 2023-06-03 LAB — CULTURE, BLOOD (ROUTINE X 2)
Culture: NO GROWTH
Culture: NO GROWTH
Special Requests: ADEQUATE

## 2023-06-04 ENCOUNTER — Other Ambulatory Visit (HOSPITAL_COMMUNITY): Payer: Self-pay

## 2023-06-04 DIAGNOSIS — J9621 Acute and chronic respiratory failure with hypoxia: Secondary | ICD-10-CM

## 2023-06-04 DIAGNOSIS — J159 Unspecified bacterial pneumonia: Secondary | ICD-10-CM

## 2023-06-04 DIAGNOSIS — Y95 Nosocomial condition: Secondary | ICD-10-CM

## 2023-06-04 DIAGNOSIS — I462 Cardiac arrest due to underlying cardiac condition: Secondary | ICD-10-CM

## 2023-06-04 DIAGNOSIS — G931 Anoxic brain damage, not elsewhere classified: Secondary | ICD-10-CM

## 2023-06-04 DIAGNOSIS — Z930 Tracheostomy status: Secondary | ICD-10-CM

## 2023-06-05 DIAGNOSIS — I462 Cardiac arrest due to underlying cardiac condition: Secondary | ICD-10-CM

## 2023-06-05 DIAGNOSIS — G931 Anoxic brain damage, not elsewhere classified: Secondary | ICD-10-CM

## 2023-06-05 DIAGNOSIS — Y95 Nosocomial condition: Secondary | ICD-10-CM

## 2023-06-05 DIAGNOSIS — Z930 Tracheostomy status: Secondary | ICD-10-CM

## 2023-06-05 DIAGNOSIS — J9621 Acute and chronic respiratory failure with hypoxia: Secondary | ICD-10-CM

## 2023-06-05 DIAGNOSIS — J159 Unspecified bacterial pneumonia: Secondary | ICD-10-CM

## 2023-06-06 DIAGNOSIS — Y95 Nosocomial condition: Secondary | ICD-10-CM

## 2023-06-06 DIAGNOSIS — Z930 Tracheostomy status: Secondary | ICD-10-CM | POA: Diagnosis not present

## 2023-06-06 DIAGNOSIS — G931 Anoxic brain damage, not elsewhere classified: Secondary | ICD-10-CM | POA: Diagnosis not present

## 2023-06-06 DIAGNOSIS — I462 Cardiac arrest due to underlying cardiac condition: Secondary | ICD-10-CM | POA: Diagnosis not present

## 2023-06-06 DIAGNOSIS — J9621 Acute and chronic respiratory failure with hypoxia: Secondary | ICD-10-CM | POA: Diagnosis not present

## 2023-06-06 DIAGNOSIS — J159 Unspecified bacterial pneumonia: Secondary | ICD-10-CM

## 2023-06-06 LAB — BASIC METABOLIC PANEL WITH GFR
Anion gap: 9 (ref 5–15)
BUN: 11 mg/dL (ref 6–20)
CO2: 20 mmol/L — ABNORMAL LOW (ref 22–32)
Calcium: 8 mg/dL — ABNORMAL LOW (ref 8.9–10.3)
Chloride: 109 mmol/L (ref 98–111)
Creatinine, Ser: 0.42 mg/dL — ABNORMAL LOW (ref 0.44–1.00)
GFR, Estimated: >60 mL/min
Glucose, Bld: 105 mg/dL — ABNORMAL HIGH (ref 70–99)
Potassium: 3.9 mmol/L (ref 3.5–5.1)
Sodium: 138 mmol/L (ref 135–145)

## 2023-06-07 DIAGNOSIS — Z930 Tracheostomy status: Secondary | ICD-10-CM | POA: Diagnosis not present

## 2023-06-07 DIAGNOSIS — I462 Cardiac arrest due to underlying cardiac condition: Secondary | ICD-10-CM | POA: Diagnosis not present

## 2023-06-07 DIAGNOSIS — J159 Unspecified bacterial pneumonia: Secondary | ICD-10-CM

## 2023-06-07 DIAGNOSIS — G931 Anoxic brain damage, not elsewhere classified: Secondary | ICD-10-CM | POA: Diagnosis not present

## 2023-06-07 DIAGNOSIS — J9621 Acute and chronic respiratory failure with hypoxia: Secondary | ICD-10-CM | POA: Diagnosis not present

## 2023-06-07 DIAGNOSIS — Y95 Nosocomial condition: Secondary | ICD-10-CM

## 2023-06-07 LAB — VANCOMYCIN, TROUGH: Vancomycin Tr: 17 ug/mL (ref 15–20)

## 2023-06-08 DIAGNOSIS — Z930 Tracheostomy status: Secondary | ICD-10-CM | POA: Diagnosis not present

## 2023-06-08 DIAGNOSIS — Y95 Nosocomial condition: Secondary | ICD-10-CM

## 2023-06-08 DIAGNOSIS — J9621 Acute and chronic respiratory failure with hypoxia: Secondary | ICD-10-CM | POA: Diagnosis not present

## 2023-06-08 DIAGNOSIS — J159 Unspecified bacterial pneumonia: Secondary | ICD-10-CM

## 2023-06-08 DIAGNOSIS — I462 Cardiac arrest due to underlying cardiac condition: Secondary | ICD-10-CM | POA: Diagnosis not present

## 2023-06-08 DIAGNOSIS — G931 Anoxic brain damage, not elsewhere classified: Secondary | ICD-10-CM | POA: Diagnosis not present

## 2023-06-08 LAB — COMPREHENSIVE METABOLIC PANEL WITH GFR
ALT: 41 U/L (ref 0–44)
AST: 20 U/L (ref 15–41)
Albumin: 1.9 g/dL — ABNORMAL LOW (ref 3.5–5.0)
Alkaline Phosphatase: 240 U/L — ABNORMAL HIGH (ref 38–126)
Anion gap: 12 (ref 5–15)
BUN: 15 mg/dL (ref 6–20)
CO2: 21 mmol/L — ABNORMAL LOW (ref 22–32)
Calcium: 8.2 mg/dL — ABNORMAL LOW (ref 8.9–10.3)
Chloride: 106 mmol/L (ref 98–111)
Creatinine, Ser: 0.4 mg/dL — ABNORMAL LOW (ref 0.44–1.00)
GFR, Estimated: >60 mL/min (ref >60)
Glucose, Bld: 89 mg/dL (ref 70–99)
Potassium: 4.2 mmol/L (ref 3.5–5.1)
Sodium: 139 mmol/L (ref 135–145)
Total Bilirubin: 0.8 mg/dL (ref 0.0–1.2)
Total Protein: 5.1 g/dL — ABNORMAL LOW (ref 6.5–8.1)

## 2023-06-08 LAB — CBC
HCT: 28.1 % — ABNORMAL LOW (ref 36.0–46.0)
Hemoglobin: 8.9 g/dL — ABNORMAL LOW (ref 12.0–15.0)
MCH: 25.9 pg — ABNORMAL LOW (ref 26.0–34.0)
MCHC: 31.7 g/dL (ref 30.0–36.0)
MCV: 81.9 fL (ref 80.0–100.0)
Platelets: 278 10*3/uL (ref 150–400)
RBC: 3.43 MIL/uL — ABNORMAL LOW (ref 3.87–5.11)
RDW: 20.4 % — ABNORMAL HIGH (ref 11.5–15.5)
WBC: 15.8 10*3/uL — ABNORMAL HIGH (ref 4.0–10.5)
nRBC: 0.2 % (ref 0.0–0.2)

## 2023-06-08 LAB — MAGNESIUM: Magnesium: 1.6 mg/dL — ABNORMAL LOW (ref 1.7–2.4)

## 2023-06-09 DIAGNOSIS — J159 Unspecified bacterial pneumonia: Secondary | ICD-10-CM

## 2023-06-09 DIAGNOSIS — Z930 Tracheostomy status: Secondary | ICD-10-CM | POA: Diagnosis not present

## 2023-06-09 DIAGNOSIS — J9621 Acute and chronic respiratory failure with hypoxia: Secondary | ICD-10-CM | POA: Diagnosis not present

## 2023-06-09 DIAGNOSIS — I462 Cardiac arrest due to underlying cardiac condition: Secondary | ICD-10-CM | POA: Diagnosis not present

## 2023-06-09 DIAGNOSIS — Y95 Nosocomial condition: Secondary | ICD-10-CM

## 2023-06-09 DIAGNOSIS — G931 Anoxic brain damage, not elsewhere classified: Secondary | ICD-10-CM | POA: Diagnosis not present

## 2023-06-09 LAB — MAGNESIUM: Magnesium: 1.7 mg/dL (ref 1.7–2.4)

## 2023-06-10 ENCOUNTER — Other Ambulatory Visit (HOSPITAL_COMMUNITY): Payer: Self-pay

## 2023-06-10 ENCOUNTER — Encounter (HOSPITAL_BASED_OUTPATIENT_CLINIC_OR_DEPARTMENT_OTHER): Payer: Self-pay

## 2023-06-10 DIAGNOSIS — J159 Unspecified bacterial pneumonia: Secondary | ICD-10-CM

## 2023-06-10 DIAGNOSIS — J9621 Acute and chronic respiratory failure with hypoxia: Secondary | ICD-10-CM

## 2023-06-10 DIAGNOSIS — Y95 Nosocomial condition: Secondary | ICD-10-CM

## 2023-06-10 DIAGNOSIS — Z930 Tracheostomy status: Secondary | ICD-10-CM

## 2023-06-10 DIAGNOSIS — G931 Anoxic brain damage, not elsewhere classified: Secondary | ICD-10-CM

## 2023-06-10 DIAGNOSIS — M7989 Other specified soft tissue disorders: Secondary | ICD-10-CM | POA: Diagnosis not present

## 2023-06-10 DIAGNOSIS — I462 Cardiac arrest due to underlying cardiac condition: Secondary | ICD-10-CM

## 2023-06-10 NOTE — Progress Notes (Signed)
 VASCULAR LAB    Right upper extremity venous duplex has been performed.  See CV proc for preliminary results.  Gave RN verbal results  Talin Rozeboom, RVT 06/10/2023, 1:54 PM

## 2023-06-11 DIAGNOSIS — J9621 Acute and chronic respiratory failure with hypoxia: Secondary | ICD-10-CM

## 2023-06-11 DIAGNOSIS — J159 Unspecified bacterial pneumonia: Secondary | ICD-10-CM

## 2023-06-11 DIAGNOSIS — I462 Cardiac arrest due to underlying cardiac condition: Secondary | ICD-10-CM

## 2023-06-11 DIAGNOSIS — Y95 Nosocomial condition: Secondary | ICD-10-CM

## 2023-06-11 DIAGNOSIS — Z930 Tracheostomy status: Secondary | ICD-10-CM

## 2023-06-11 DIAGNOSIS — G931 Anoxic brain damage, not elsewhere classified: Secondary | ICD-10-CM

## 2023-06-12 ENCOUNTER — Other Ambulatory Visit (HOSPITAL_COMMUNITY)

## 2023-06-12 DIAGNOSIS — J9621 Acute and chronic respiratory failure with hypoxia: Secondary | ICD-10-CM | POA: Diagnosis not present

## 2023-06-12 DIAGNOSIS — I462 Cardiac arrest due to underlying cardiac condition: Secondary | ICD-10-CM | POA: Diagnosis not present

## 2023-06-12 DIAGNOSIS — J159 Unspecified bacterial pneumonia: Secondary | ICD-10-CM

## 2023-06-12 DIAGNOSIS — G931 Anoxic brain damage, not elsewhere classified: Secondary | ICD-10-CM | POA: Diagnosis not present

## 2023-06-12 DIAGNOSIS — Z930 Tracheostomy status: Secondary | ICD-10-CM | POA: Diagnosis not present

## 2023-06-12 DIAGNOSIS — Y95 Nosocomial condition: Secondary | ICD-10-CM

## 2023-06-13 DIAGNOSIS — J159 Unspecified bacterial pneumonia: Secondary | ICD-10-CM

## 2023-06-13 DIAGNOSIS — I462 Cardiac arrest due to underlying cardiac condition: Secondary | ICD-10-CM | POA: Diagnosis not present

## 2023-06-13 DIAGNOSIS — G931 Anoxic brain damage, not elsewhere classified: Secondary | ICD-10-CM | POA: Diagnosis not present

## 2023-06-13 DIAGNOSIS — Z930 Tracheostomy status: Secondary | ICD-10-CM | POA: Diagnosis not present

## 2023-06-13 DIAGNOSIS — Y95 Nosocomial condition: Secondary | ICD-10-CM

## 2023-06-13 DIAGNOSIS — J9621 Acute and chronic respiratory failure with hypoxia: Secondary | ICD-10-CM | POA: Diagnosis not present

## 2023-06-13 LAB — COMPREHENSIVE METABOLIC PANEL WITH GFR
ALT: 35 U/L (ref 0–44)
AST: 17 U/L (ref 15–41)
Albumin: 1.7 g/dL — ABNORMAL LOW (ref 3.5–5.0)
Alkaline Phosphatase: 270 U/L — ABNORMAL HIGH (ref 38–126)
Anion gap: 8 (ref 5–15)
BUN: 22 mg/dL — ABNORMAL HIGH (ref 6–20)
CO2: 24 mmol/L (ref 22–32)
Calcium: 8.4 mg/dL — ABNORMAL LOW (ref 8.9–10.3)
Chloride: 103 mmol/L (ref 98–111)
Creatinine, Ser: 0.77 mg/dL (ref 0.44–1.00)
GFR, Estimated: >60 mL/min (ref >60)
Glucose, Bld: 147 mg/dL — ABNORMAL HIGH (ref 70–99)
Potassium: 4.9 mmol/L (ref 3.5–5.1)
Sodium: 135 mmol/L (ref 135–145)
Total Bilirubin: 0.5 mg/dL (ref 0.0–1.2)
Total Protein: 5.3 g/dL — ABNORMAL LOW (ref 6.5–8.1)

## 2023-06-13 LAB — CBC
HCT: 24.8 % — ABNORMAL LOW (ref 36.0–46.0)
Hemoglobin: 7.7 g/dL — ABNORMAL LOW (ref 12.0–15.0)
MCH: 25.8 pg — ABNORMAL LOW (ref 26.0–34.0)
MCHC: 31 g/dL (ref 30.0–36.0)
MCV: 82.9 fL (ref 80.0–100.0)
Platelets: 212 10*3/uL (ref 150–400)
RBC: 2.99 MIL/uL — ABNORMAL LOW (ref 3.87–5.11)
RDW: 19.9 % — ABNORMAL HIGH (ref 11.5–15.5)
WBC: 8.9 10*3/uL (ref 4.0–10.5)
nRBC: 0 % (ref 0.0–0.2)

## 2023-06-13 LAB — MAGNESIUM: Magnesium: 1.8 mg/dL (ref 1.7–2.4)

## 2023-06-14 DIAGNOSIS — J9621 Acute and chronic respiratory failure with hypoxia: Secondary | ICD-10-CM

## 2023-06-14 DIAGNOSIS — Y95 Nosocomial condition: Secondary | ICD-10-CM

## 2023-06-14 DIAGNOSIS — I462 Cardiac arrest due to underlying cardiac condition: Secondary | ICD-10-CM

## 2023-06-14 DIAGNOSIS — Z930 Tracheostomy status: Secondary | ICD-10-CM

## 2023-06-14 DIAGNOSIS — G931 Anoxic brain damage, not elsewhere classified: Secondary | ICD-10-CM

## 2023-06-14 DIAGNOSIS — J159 Unspecified bacterial pneumonia: Secondary | ICD-10-CM

## 2023-06-14 LAB — VANCOMYCIN, TROUGH: Vancomycin Tr: 39 ug/mL (ref 15–20)

## 2023-06-15 DIAGNOSIS — Y95 Nosocomial condition: Secondary | ICD-10-CM

## 2023-06-15 DIAGNOSIS — J159 Unspecified bacterial pneumonia: Secondary | ICD-10-CM

## 2023-06-15 DIAGNOSIS — I462 Cardiac arrest due to underlying cardiac condition: Secondary | ICD-10-CM

## 2023-06-15 DIAGNOSIS — G931 Anoxic brain damage, not elsewhere classified: Secondary | ICD-10-CM

## 2023-06-15 DIAGNOSIS — J9621 Acute and chronic respiratory failure with hypoxia: Secondary | ICD-10-CM

## 2023-06-15 DIAGNOSIS — Z930 Tracheostomy status: Secondary | ICD-10-CM

## 2023-06-15 LAB — BASIC METABOLIC PANEL WITH GFR
Anion gap: 11 (ref 5–15)
BUN: 30 mg/dL — ABNORMAL HIGH (ref 6–20)
CO2: 21 mmol/L — ABNORMAL LOW (ref 22–32)
Calcium: 8.6 mg/dL — ABNORMAL LOW (ref 8.9–10.3)
Chloride: 104 mmol/L (ref 98–111)
Creatinine, Ser: 0.97 mg/dL (ref 0.44–1.00)
GFR, Estimated: >60 mL/min (ref >60)
Glucose, Bld: 172 mg/dL — ABNORMAL HIGH (ref 70–99)
Potassium: 5.3 mmol/L — ABNORMAL HIGH (ref 3.5–5.1)
Sodium: 136 mmol/L (ref 135–145)

## 2023-06-15 LAB — VANCOMYCIN, TROUGH: Vancomycin Tr: 27 ug/mL (ref 15–20)

## 2023-06-16 DIAGNOSIS — J9621 Acute and chronic respiratory failure with hypoxia: Secondary | ICD-10-CM

## 2023-06-16 DIAGNOSIS — Z930 Tracheostomy status: Secondary | ICD-10-CM

## 2023-06-16 DIAGNOSIS — G931 Anoxic brain damage, not elsewhere classified: Secondary | ICD-10-CM

## 2023-06-16 DIAGNOSIS — Y95 Nosocomial condition: Secondary | ICD-10-CM

## 2023-06-16 DIAGNOSIS — I462 Cardiac arrest due to underlying cardiac condition: Secondary | ICD-10-CM

## 2023-06-16 DIAGNOSIS — J159 Unspecified bacterial pneumonia: Secondary | ICD-10-CM

## 2023-06-16 LAB — BASIC METABOLIC PANEL WITH GFR
Anion gap: 10 (ref 5–15)
BUN: 32 mg/dL — ABNORMAL HIGH (ref 6–20)
CO2: 23 mmol/L (ref 22–32)
Calcium: 8.7 mg/dL — ABNORMAL LOW (ref 8.9–10.3)
Chloride: 102 mmol/L (ref 98–111)
Creatinine, Ser: 1.03 mg/dL — ABNORMAL HIGH (ref 0.44–1.00)
GFR, Estimated: >60 mL/min (ref >60)
Glucose, Bld: 102 mg/dL — ABNORMAL HIGH (ref 70–99)
Potassium: 4.9 mmol/L (ref 3.5–5.1)
Sodium: 135 mmol/L (ref 135–145)

## 2023-06-16 LAB — VANCOMYCIN, TROUGH: Vancomycin Tr: 21 ug/mL (ref 15–20)

## 2023-06-17 DIAGNOSIS — Z930 Tracheostomy status: Secondary | ICD-10-CM

## 2023-06-17 DIAGNOSIS — Y95 Nosocomial condition: Secondary | ICD-10-CM

## 2023-06-17 DIAGNOSIS — J9621 Acute and chronic respiratory failure with hypoxia: Secondary | ICD-10-CM

## 2023-06-17 DIAGNOSIS — J159 Unspecified bacterial pneumonia: Secondary | ICD-10-CM

## 2023-06-17 DIAGNOSIS — G931 Anoxic brain damage, not elsewhere classified: Secondary | ICD-10-CM

## 2023-06-17 DIAGNOSIS — I462 Cardiac arrest due to underlying cardiac condition: Secondary | ICD-10-CM

## 2023-06-17 LAB — BASIC METABOLIC PANEL WITH GFR
Anion gap: 10 (ref 5–15)
BUN: 35 mg/dL — ABNORMAL HIGH (ref 6–20)
CO2: 23 mmol/L (ref 22–32)
Calcium: 8.5 mg/dL — ABNORMAL LOW (ref 8.9–10.3)
Chloride: 101 mmol/L (ref 98–111)
Creatinine, Ser: 1.08 mg/dL — ABNORMAL HIGH (ref 0.44–1.00)
GFR, Estimated: >60 mL/min (ref >60)
Glucose, Bld: 153 mg/dL — ABNORMAL HIGH (ref 70–99)
Potassium: 5 mmol/L (ref 3.5–5.1)
Sodium: 134 mmol/L — ABNORMAL LOW (ref 135–145)

## 2023-06-17 LAB — VANCOMYCIN, TROUGH: Vancomycin Tr: 16 ug/mL (ref 15–20)

## 2023-06-18 ENCOUNTER — Other Ambulatory Visit (HOSPITAL_COMMUNITY)

## 2023-06-18 DIAGNOSIS — I462 Cardiac arrest due to underlying cardiac condition: Secondary | ICD-10-CM

## 2023-06-18 DIAGNOSIS — J9621 Acute and chronic respiratory failure with hypoxia: Secondary | ICD-10-CM

## 2023-06-18 DIAGNOSIS — Y95 Nosocomial condition: Secondary | ICD-10-CM

## 2023-06-18 DIAGNOSIS — Z930 Tracheostomy status: Secondary | ICD-10-CM

## 2023-06-18 DIAGNOSIS — J159 Unspecified bacterial pneumonia: Secondary | ICD-10-CM

## 2023-06-18 DIAGNOSIS — G931 Anoxic brain damage, not elsewhere classified: Secondary | ICD-10-CM

## 2023-06-19 DIAGNOSIS — G931 Anoxic brain damage, not elsewhere classified: Secondary | ICD-10-CM | POA: Diagnosis not present

## 2023-06-19 DIAGNOSIS — Z930 Tracheostomy status: Secondary | ICD-10-CM | POA: Diagnosis not present

## 2023-06-19 DIAGNOSIS — J9621 Acute and chronic respiratory failure with hypoxia: Secondary | ICD-10-CM | POA: Diagnosis not present

## 2023-06-19 DIAGNOSIS — J159 Unspecified bacterial pneumonia: Secondary | ICD-10-CM

## 2023-06-19 DIAGNOSIS — Y95 Nosocomial condition: Secondary | ICD-10-CM

## 2023-06-19 DIAGNOSIS — I462 Cardiac arrest due to underlying cardiac condition: Secondary | ICD-10-CM | POA: Diagnosis not present

## 2023-06-19 LAB — VANCOMYCIN, TROUGH: Vancomycin Tr: 32 ug/mL (ref 15–20)

## 2023-06-20 ENCOUNTER — Other Ambulatory Visit (HOSPITAL_COMMUNITY)

## 2023-06-20 DIAGNOSIS — J9621 Acute and chronic respiratory failure with hypoxia: Secondary | ICD-10-CM | POA: Diagnosis not present

## 2023-06-20 DIAGNOSIS — Z930 Tracheostomy status: Secondary | ICD-10-CM | POA: Diagnosis not present

## 2023-06-20 DIAGNOSIS — J159 Unspecified bacterial pneumonia: Secondary | ICD-10-CM

## 2023-06-20 DIAGNOSIS — G931 Anoxic brain damage, not elsewhere classified: Secondary | ICD-10-CM | POA: Diagnosis not present

## 2023-06-20 DIAGNOSIS — Y95 Nosocomial condition: Secondary | ICD-10-CM

## 2023-06-20 DIAGNOSIS — I462 Cardiac arrest due to underlying cardiac condition: Secondary | ICD-10-CM | POA: Diagnosis not present

## 2023-06-20 LAB — VANCOMYCIN, TROUGH: Vancomycin Tr: 23 ug/mL (ref 15–20)

## 2023-06-21 DIAGNOSIS — G931 Anoxic brain damage, not elsewhere classified: Secondary | ICD-10-CM

## 2023-06-21 DIAGNOSIS — J9621 Acute and chronic respiratory failure with hypoxia: Secondary | ICD-10-CM

## 2023-06-21 DIAGNOSIS — I462 Cardiac arrest due to underlying cardiac condition: Secondary | ICD-10-CM

## 2023-06-21 DIAGNOSIS — Y95 Nosocomial condition: Secondary | ICD-10-CM

## 2023-06-21 DIAGNOSIS — J159 Unspecified bacterial pneumonia: Secondary | ICD-10-CM

## 2023-06-21 DIAGNOSIS — Z930 Tracheostomy status: Secondary | ICD-10-CM

## 2023-06-21 LAB — VANCOMYCIN, TROUGH: Vancomycin Tr: 18 ug/mL (ref 15–20)

## 2023-06-22 DIAGNOSIS — Z930 Tracheostomy status: Secondary | ICD-10-CM | POA: Diagnosis not present

## 2023-06-22 DIAGNOSIS — I462 Cardiac arrest due to underlying cardiac condition: Secondary | ICD-10-CM | POA: Diagnosis not present

## 2023-06-22 DIAGNOSIS — J159 Unspecified bacterial pneumonia: Secondary | ICD-10-CM

## 2023-06-22 DIAGNOSIS — Y95 Nosocomial condition: Secondary | ICD-10-CM

## 2023-06-22 DIAGNOSIS — J9621 Acute and chronic respiratory failure with hypoxia: Secondary | ICD-10-CM | POA: Diagnosis not present

## 2023-06-22 DIAGNOSIS — G931 Anoxic brain damage, not elsewhere classified: Secondary | ICD-10-CM | POA: Diagnosis not present

## 2023-06-22 LAB — COMPREHENSIVE METABOLIC PANEL WITH GFR
ALT: 53 U/L — ABNORMAL HIGH (ref 0–44)
AST: 27 U/L (ref 15–41)
Albumin: 2 g/dL — ABNORMAL LOW (ref 3.5–5.0)
Alkaline Phosphatase: 346 U/L — ABNORMAL HIGH (ref 38–126)
Anion gap: 9 (ref 5–15)
BUN: 37 mg/dL — ABNORMAL HIGH (ref 6–20)
CO2: 24 mmol/L (ref 22–32)
Calcium: 8.8 mg/dL — ABNORMAL LOW (ref 8.9–10.3)
Chloride: 102 mmol/L (ref 98–111)
Creatinine, Ser: 1.09 mg/dL — ABNORMAL HIGH (ref 0.44–1.00)
GFR, Estimated: 60 mL/min — ABNORMAL LOW (ref >60)
Glucose, Bld: 154 mg/dL — ABNORMAL HIGH (ref 70–99)
Potassium: 5.4 mmol/L — ABNORMAL HIGH (ref 3.5–5.1)
Sodium: 135 mmol/L (ref 135–145)
Total Bilirubin: 0.6 mg/dL (ref 0.0–1.2)
Total Protein: 5.9 g/dL — ABNORMAL LOW (ref 6.5–8.1)

## 2023-06-22 LAB — CBC
HCT: 26 % — ABNORMAL LOW (ref 36.0–46.0)
Hemoglobin: 7.9 g/dL — ABNORMAL LOW (ref 12.0–15.0)
MCH: 25.1 pg — ABNORMAL LOW (ref 26.0–34.0)
MCHC: 30.4 g/dL (ref 30.0–36.0)
MCV: 82.5 fL (ref 80.0–100.0)
Platelets: 341 10*3/uL (ref 150–400)
RBC: 3.15 MIL/uL — ABNORMAL LOW (ref 3.87–5.11)
RDW: 19.3 % — ABNORMAL HIGH (ref 11.5–15.5)
WBC: 10.8 10*3/uL — ABNORMAL HIGH (ref 4.0–10.5)
nRBC: 0.2 % (ref 0.0–0.2)

## 2023-06-22 LAB — MAGNESIUM: Magnesium: 1.9 mg/dL (ref 1.7–2.4)

## 2023-06-23 DIAGNOSIS — Y95 Nosocomial condition: Secondary | ICD-10-CM

## 2023-06-23 DIAGNOSIS — Z930 Tracheostomy status: Secondary | ICD-10-CM | POA: Diagnosis not present

## 2023-06-23 DIAGNOSIS — I462 Cardiac arrest due to underlying cardiac condition: Secondary | ICD-10-CM | POA: Diagnosis not present

## 2023-06-23 DIAGNOSIS — J159 Unspecified bacterial pneumonia: Secondary | ICD-10-CM

## 2023-06-23 DIAGNOSIS — J9621 Acute and chronic respiratory failure with hypoxia: Secondary | ICD-10-CM | POA: Diagnosis not present

## 2023-06-23 DIAGNOSIS — G931 Anoxic brain damage, not elsewhere classified: Secondary | ICD-10-CM | POA: Diagnosis not present

## 2023-06-23 LAB — POTASSIUM: Potassium: 5.6 mmol/L — ABNORMAL HIGH (ref 3.5–5.1)

## 2023-06-24 LAB — POTASSIUM: Potassium: 4.9 mmol/L (ref 3.5–5.1)

## 2023-07-23 DEATH — deceased

## 2023-08-22 ENCOUNTER — Encounter (HOSPITAL_COMMUNITY): Payer: Managed Care, Other (non HMO)
